# Patient Record
Sex: Female | Born: 1950 | Race: White | Hispanic: No | Marital: Married | Smoking: Never smoker
Health system: Southern US, Community
[De-identification: ages and names within clinical notes are randomized; demographics above are authoritative.]

## PROBLEM LIST (undated history)

## (undated) DIAGNOSIS — Z8719 Personal history of other diseases of the digestive system: Secondary | ICD-10-CM

## (undated) DIAGNOSIS — H269 Unspecified cataract: Secondary | ICD-10-CM

## (undated) DIAGNOSIS — Z7901 Long term (current) use of anticoagulants: Secondary | ICD-10-CM

## (undated) DIAGNOSIS — I480 Paroxysmal atrial fibrillation: Secondary | ICD-10-CM

## (undated) DIAGNOSIS — N950 Postmenopausal bleeding: Secondary | ICD-10-CM

## (undated) DIAGNOSIS — I10 Essential (primary) hypertension: Secondary | ICD-10-CM

## (undated) DIAGNOSIS — F329 Major depressive disorder, single episode, unspecified: Secondary | ICD-10-CM

## (undated) DIAGNOSIS — M1990 Unspecified osteoarthritis, unspecified site: Secondary | ICD-10-CM

## (undated) DIAGNOSIS — Z8701 Personal history of pneumonia (recurrent): Secondary | ICD-10-CM

## (undated) DIAGNOSIS — E7800 Pure hypercholesterolemia, unspecified: Secondary | ICD-10-CM

## (undated) DIAGNOSIS — R350 Frequency of micturition: Secondary | ICD-10-CM

## (undated) DIAGNOSIS — J40 Bronchitis, not specified as acute or chronic: Secondary | ICD-10-CM

## (undated) DIAGNOSIS — E669 Obesity, unspecified: Secondary | ICD-10-CM

## (undated) DIAGNOSIS — Z9889 Other specified postprocedural states: Secondary | ICD-10-CM

## (undated) DIAGNOSIS — K219 Gastro-esophageal reflux disease without esophagitis: Secondary | ICD-10-CM

## (undated) DIAGNOSIS — R1310 Dysphagia, unspecified: Secondary | ICD-10-CM

## (undated) DIAGNOSIS — R002 Palpitations: Secondary | ICD-10-CM

## (undated) DIAGNOSIS — F3289 Other specified depressive episodes: Secondary | ICD-10-CM

## (undated) DIAGNOSIS — J189 Pneumonia, unspecified organism: Secondary | ICD-10-CM

## (undated) DIAGNOSIS — G4733 Obstructive sleep apnea (adult) (pediatric): Secondary | ICD-10-CM

## (undated) DIAGNOSIS — M722 Plantar fascial fibromatosis: Secondary | ICD-10-CM

## (undated) DIAGNOSIS — R519 Headache, unspecified: Secondary | ICD-10-CM

## (undated) DIAGNOSIS — R112 Nausea with vomiting, unspecified: Secondary | ICD-10-CM

## (undated) DIAGNOSIS — I872 Venous insufficiency (chronic) (peripheral): Secondary | ICD-10-CM

## (undated) DIAGNOSIS — Z8669 Personal history of other diseases of the nervous system and sense organs: Secondary | ICD-10-CM

## (undated) HISTORY — DX: Venous insufficiency (chronic) (peripheral): I87.2

## (undated) HISTORY — PX: COLONOSCOPY WITH PROPOFOL: SHX5780

## (undated) HISTORY — DX: Pure hypercholesterolemia, unspecified: E78.00

## (undated) HISTORY — DX: Essential (primary) hypertension: I10

## (undated) HISTORY — DX: Obesity, unspecified: E66.9

## (undated) HISTORY — PX: KNEE ARTHROSCOPY W/ MENISCAL REPAIR: SHX1877

## (undated) HISTORY — PX: KNEE ARTHROSCOPY: SUR90

## (undated) HISTORY — DX: Other specified depressive episodes: F32.89

## (undated) HISTORY — DX: Major depressive disorder, single episode, unspecified: F32.9

## (undated) HISTORY — PX: TRIGGER FINGER RELEASE: SHX641

## (undated) HISTORY — DX: Dysphagia, unspecified: R13.10

## (undated) HISTORY — DX: Palpitations: R00.2

## (undated) HISTORY — PX: KNEE ARTHROSCOPY WITH EXCISION BAKER'S CYST: SHX5646

## (undated) HISTORY — PX: CARPAL TUNNEL RELEASE: SHX101

## (undated) HISTORY — DX: Plantar fascial fibromatosis: M72.2

---

## 1975-07-24 HISTORY — PX: TUBAL LIGATION: SHX77

## 1998-11-23 ENCOUNTER — Other Ambulatory Visit: Admission: RE | Admit: 1998-11-23 | Discharge: 1998-11-23 | Payer: Self-pay | Admitting: *Deleted

## 1999-10-04 ENCOUNTER — Encounter: Admission: RE | Admit: 1999-10-04 | Discharge: 1999-10-04 | Payer: Self-pay | Admitting: *Deleted

## 1999-10-04 ENCOUNTER — Encounter: Payer: Self-pay | Admitting: *Deleted

## 2000-02-14 ENCOUNTER — Other Ambulatory Visit: Admission: RE | Admit: 2000-02-14 | Discharge: 2000-02-14 | Payer: Self-pay | Admitting: *Deleted

## 2000-10-07 ENCOUNTER — Encounter: Admission: RE | Admit: 2000-10-07 | Discharge: 2000-10-07 | Payer: Self-pay | Admitting: *Deleted

## 2000-10-07 ENCOUNTER — Encounter: Payer: Self-pay | Admitting: *Deleted

## 2001-04-02 ENCOUNTER — Other Ambulatory Visit: Admission: RE | Admit: 2001-04-02 | Discharge: 2001-04-02 | Payer: Self-pay | Admitting: *Deleted

## 2001-12-30 ENCOUNTER — Encounter: Admission: RE | Admit: 2001-12-30 | Discharge: 2001-12-30 | Payer: Self-pay | Admitting: *Deleted

## 2001-12-30 ENCOUNTER — Encounter: Payer: Self-pay | Admitting: *Deleted

## 2002-04-22 ENCOUNTER — Other Ambulatory Visit: Admission: RE | Admit: 2002-04-22 | Discharge: 2002-04-22 | Payer: Self-pay | Admitting: *Deleted

## 2002-07-23 HISTORY — PX: CARPAL TUNNEL RELEASE: SHX101

## 2003-01-28 ENCOUNTER — Ambulatory Visit (HOSPITAL_COMMUNITY): Admission: RE | Admit: 2003-01-28 | Discharge: 2003-01-28 | Payer: Self-pay | Admitting: Orthopedic Surgery

## 2003-03-31 ENCOUNTER — Encounter: Admission: RE | Admit: 2003-03-31 | Discharge: 2003-03-31 | Payer: Self-pay | Admitting: *Deleted

## 2003-03-31 ENCOUNTER — Encounter: Payer: Self-pay | Admitting: *Deleted

## 2003-04-28 ENCOUNTER — Other Ambulatory Visit: Admission: RE | Admit: 2003-04-28 | Discharge: 2003-04-28 | Payer: Self-pay | Admitting: *Deleted

## 2003-07-24 ENCOUNTER — Emergency Department (HOSPITAL_COMMUNITY): Admission: AD | Admit: 2003-07-24 | Discharge: 2003-07-24 | Payer: Self-pay | Admitting: Family Medicine

## 2004-06-06 ENCOUNTER — Ambulatory Visit: Payer: Self-pay | Admitting: Pulmonary Disease

## 2004-06-21 ENCOUNTER — Other Ambulatory Visit: Admission: RE | Admit: 2004-06-21 | Discharge: 2004-06-21 | Payer: Self-pay | Admitting: *Deleted

## 2004-07-18 ENCOUNTER — Ambulatory Visit: Payer: Self-pay | Admitting: Pulmonary Disease

## 2004-07-19 ENCOUNTER — Encounter: Admission: RE | Admit: 2004-07-19 | Discharge: 2004-07-19 | Payer: Self-pay | Admitting: *Deleted

## 2004-07-23 HISTORY — PX: CARPAL TUNNEL RELEASE: SHX101

## 2004-07-28 ENCOUNTER — Ambulatory Visit (HOSPITAL_COMMUNITY): Admission: RE | Admit: 2004-07-28 | Discharge: 2004-07-28 | Payer: Self-pay | Admitting: Orthopedic Surgery

## 2004-07-28 ENCOUNTER — Ambulatory Visit (HOSPITAL_BASED_OUTPATIENT_CLINIC_OR_DEPARTMENT_OTHER): Admission: RE | Admit: 2004-07-28 | Discharge: 2004-07-28 | Payer: Self-pay | Admitting: Orthopedic Surgery

## 2005-01-03 ENCOUNTER — Ambulatory Visit: Payer: Self-pay | Admitting: Pulmonary Disease

## 2005-01-15 ENCOUNTER — Ambulatory Visit: Payer: Self-pay | Admitting: Pulmonary Disease

## 2005-04-10 ENCOUNTER — Ambulatory Visit: Payer: Self-pay | Admitting: Pulmonary Disease

## 2005-05-29 ENCOUNTER — Ambulatory Visit: Payer: Self-pay | Admitting: Pulmonary Disease

## 2005-07-31 ENCOUNTER — Ambulatory Visit: Payer: Self-pay | Admitting: Pulmonary Disease

## 2005-08-03 ENCOUNTER — Ambulatory Visit: Payer: Self-pay | Admitting: Pulmonary Disease

## 2005-09-25 ENCOUNTER — Other Ambulatory Visit: Admission: RE | Admit: 2005-09-25 | Discharge: 2005-09-25 | Payer: Self-pay | Admitting: Obstetrics & Gynecology

## 2005-10-10 ENCOUNTER — Encounter: Admission: RE | Admit: 2005-10-10 | Discharge: 2005-10-10 | Payer: Self-pay | Admitting: Obstetrics and Gynecology

## 2005-10-23 ENCOUNTER — Encounter: Admission: RE | Admit: 2005-10-23 | Discharge: 2005-10-23 | Payer: Self-pay | Admitting: Orthopedic Surgery

## 2006-01-03 ENCOUNTER — Ambulatory Visit: Payer: Self-pay | Admitting: Pulmonary Disease

## 2006-01-10 ENCOUNTER — Ambulatory Visit: Payer: Self-pay | Admitting: Pulmonary Disease

## 2006-04-02 ENCOUNTER — Ambulatory Visit: Payer: Self-pay | Admitting: Pulmonary Disease

## 2006-06-25 ENCOUNTER — Ambulatory Visit: Payer: Self-pay | Admitting: Pulmonary Disease

## 2006-06-25 LAB — CONVERTED CEMR LAB
ALT: 19 units/L (ref 0–40)
Albumin: 4.1 g/dL (ref 3.5–5.2)
Alkaline Phosphatase: 65 units/L (ref 39–117)
BUN: 12 mg/dL (ref 6–23)
CO2: 31 meq/L (ref 19–32)
Calcium: 9.9 mg/dL (ref 8.4–10.5)
Chol/HDL Ratio, serum: 2.6
Sodium: 143 meq/L (ref 135–145)
Total Bilirubin: 1 mg/dL (ref 0.3–1.2)
Triglyceride fasting, serum: 54 mg/dL (ref 0–149)
VLDL: 11 mg/dL (ref 0–40)

## 2006-10-09 ENCOUNTER — Other Ambulatory Visit: Admission: RE | Admit: 2006-10-09 | Discharge: 2006-10-09 | Payer: Self-pay | Admitting: Obstetrics & Gynecology

## 2006-10-11 ENCOUNTER — Encounter: Admission: RE | Admit: 2006-10-11 | Discharge: 2006-10-11 | Payer: Self-pay | Admitting: Obstetrics & Gynecology

## 2007-01-07 ENCOUNTER — Ambulatory Visit: Payer: Self-pay | Admitting: Pulmonary Disease

## 2007-01-07 LAB — CONVERTED CEMR LAB
ALT: 41 units/L — ABNORMAL HIGH (ref 0–40)
AST: 35 units/L (ref 0–37)
Bilirubin Urine: NEGATIVE
CO2: 33 meq/L — ABNORMAL HIGH (ref 19–32)
Eosinophils Absolute: 0.1 10*3/uL (ref 0.0–0.6)
GFR calc Af Amer: 133 mL/min
GFR calc non Af Amer: 110 mL/min
Glucose, Bld: 94 mg/dL (ref 70–99)
HCT: 36.3 % (ref 36.0–46.0)
HDL: 49 mg/dL (ref >39.0)
LDL Cholesterol: 74 mg/dL (ref 0–99)
MCHC: 34.1 g/dL (ref 30.0–36.0)
Monocytes Absolute: 0.4 10*3/uL (ref 0.2–0.7)
Monocytes Relative: 6.1 % (ref 3.0–11.0)
Neutro Abs: 3.7 10*3/uL (ref 1.4–7.7)
Nitrite: NEGATIVE
Platelets: 186 10*3/uL (ref 150–400)
RBC: 4.17 M/uL (ref 3.87–5.11)
RDW: 12.2 % (ref 11.5–14.6)
Total Bilirubin: 0.4 mg/dL (ref 0.3–1.2)
Total Protein, Urine: NEGATIVE mg/dL
Total Protein: 6.5 g/dL (ref 6.0–8.3)
Triglycerides: 75 mg/dL (ref 0–149)
Urine Glucose: NEGATIVE mg/dL
Urobilinogen, UA: 0.2 (ref 0.0–1.0)
Vit D, 1,25-Dihydroxy: 40 (ref 20–57)
WBC: 6.1 10*3/uL (ref 4.5–10.5)
pH: 6 (ref 5.0–8.0)

## 2007-09-17 ENCOUNTER — Telehealth: Payer: Self-pay | Admitting: Pulmonary Disease

## 2007-10-02 ENCOUNTER — Encounter: Payer: Self-pay | Admitting: Pulmonary Disease

## 2007-10-02 DIAGNOSIS — IMO0001 Reserved for inherently not codable concepts without codable children: Secondary | ICD-10-CM | POA: Insufficient documentation

## 2007-10-02 DIAGNOSIS — Z87898 Personal history of other specified conditions: Secondary | ICD-10-CM | POA: Insufficient documentation

## 2007-10-02 DIAGNOSIS — T466X5A Adverse effect of antihyperlipidemic and antiarteriosclerotic drugs, initial encounter: Secondary | ICD-10-CM | POA: Insufficient documentation

## 2007-10-13 ENCOUNTER — Other Ambulatory Visit: Admission: RE | Admit: 2007-10-13 | Discharge: 2007-10-13 | Payer: Self-pay | Admitting: Obstetrics & Gynecology

## 2007-10-17 ENCOUNTER — Encounter: Admission: RE | Admit: 2007-10-17 | Discharge: 2007-10-17 | Payer: Self-pay | Admitting: Obstetrics & Gynecology

## 2007-12-11 ENCOUNTER — Ambulatory Visit: Payer: Self-pay | Admitting: Internal Medicine

## 2008-01-21 ENCOUNTER — Ambulatory Visit: Payer: Self-pay | Admitting: Pulmonary Disease

## 2008-01-21 DIAGNOSIS — E785 Hyperlipidemia, unspecified: Secondary | ICD-10-CM | POA: Insufficient documentation

## 2008-01-21 DIAGNOSIS — I10 Essential (primary) hypertension: Secondary | ICD-10-CM | POA: Insufficient documentation

## 2008-01-23 LAB — CONVERTED CEMR LAB
ALT: 24 units/L (ref 0–35)
Alkaline Phosphatase: 62 units/L (ref 39–117)
BUN: 19 mg/dL (ref 6–23)
Bacteria, UA: NEGATIVE
Bilirubin Urine: NEGATIVE
CO2: 32 meq/L (ref 19–32)
Chloride: 105 meq/L (ref 96–112)
Cholesterol: 143 mg/dL (ref 0–200)
Crystals: NEGATIVE
Eosinophils Absolute: 0.2 10*3/uL (ref 0.0–0.7)
GFR calc Af Amer: 95 mL/min
GFR calc non Af Amer: 78 mL/min
Glucose, Bld: 91 mg/dL (ref 70–99)
HCT: 39.2 % (ref 36.0–46.0)
HDL: 55.4 mg/dL (ref >39.0)
Hemoglobin: 13.4 g/dL (ref 12.0–15.0)
Ketones, ur: NEGATIVE mg/dL
LDL Cholesterol: 68 mg/dL (ref 0–99)
Lymphocytes Relative: 31.6 % (ref 12.0–46.0)
MCHC: 34.3 g/dL (ref 30.0–36.0)
Neutrophils Relative %: 59.6 % (ref 43.0–77.0)
Nitrite: NEGATIVE
Total Bilirubin: 1 mg/dL (ref 0.3–1.2)
Total Protein, Urine: NEGATIVE mg/dL
Urine Glucose: NEGATIVE mg/dL
Urobilinogen, UA: 0.2 (ref 0.0–1.0)
VLDL: 20 mg/dL (ref 0–40)

## 2008-01-29 ENCOUNTER — Ambulatory Visit: Payer: Self-pay | Admitting: Pulmonary Disease

## 2008-01-29 LAB — CONVERTED CEMR LAB
OCCULT 2: NEGATIVE
OCCULT 3: NEGATIVE
OCCULT 4: NEGATIVE

## 2008-02-03 ENCOUNTER — Ambulatory Visit: Payer: Self-pay | Admitting: Pulmonary Disease

## 2008-10-01 ENCOUNTER — Ambulatory Visit: Payer: Self-pay | Admitting: Pulmonary Disease

## 2008-10-18 ENCOUNTER — Encounter: Admission: RE | Admit: 2008-10-18 | Discharge: 2008-10-18 | Payer: Self-pay | Admitting: Obstetrics & Gynecology

## 2008-10-20 ENCOUNTER — Other Ambulatory Visit: Admission: RE | Admit: 2008-10-20 | Discharge: 2008-10-20 | Payer: Self-pay | Admitting: Obstetrics & Gynecology

## 2008-10-21 ENCOUNTER — Encounter: Admission: RE | Admit: 2008-10-21 | Discharge: 2008-10-21 | Payer: Self-pay | Admitting: Obstetrics & Gynecology

## 2009-01-20 ENCOUNTER — Ambulatory Visit: Payer: Self-pay | Admitting: Pulmonary Disease

## 2009-01-23 LAB — CONVERTED CEMR LAB
AST: 24 units/L (ref 0–37)
Alkaline Phosphatase: 64 units/L (ref 39–117)
Basophils Absolute: 0 10*3/uL (ref 0.0–0.1)
Basophils Relative: 0.7 % (ref 0.0–3.0)
Bilirubin, Direct: 0.2 mg/dL (ref 0.0–0.3)
Calcium: 9.6 mg/dL (ref 8.4–10.5)
Chloride: 104 meq/L (ref 96–112)
Cholesterol: 150 mg/dL (ref 0–200)
Creatinine, Ser: 0.7 mg/dL (ref 0.4–1.2)
Eosinophils Absolute: 0.2 10*3/uL (ref 0.0–0.7)
Eosinophils Relative: 2.4 % (ref 0.0–5.0)
HDL: 55.8 mg/dL (ref >39.00)
Hemoglobin, Urine: NEGATIVE
Hemoglobin: 13.5 g/dL (ref 12.0–15.0)
Ketones, ur: NEGATIVE mg/dL
LDL Cholesterol: 73 mg/dL (ref 0–99)
Lymphs Abs: 1.7 10*3/uL (ref 0.7–4.0)
MCHC: 34.5 g/dL (ref 30.0–36.0)
Monocytes Relative: 6.9 % (ref 3.0–12.0)
RBC: 4.6 M/uL (ref 3.87–5.11)
Sodium: 142 meq/L (ref 135–145)
Specific Gravity, Urine: 1.02 (ref 1.000–1.030)
TSH: 2.31 microintl units/mL (ref 0.35–5.50)
Total Bilirubin: 0.9 mg/dL (ref 0.3–1.2)
Total Protein, Urine: NEGATIVE mg/dL
Triglycerides: 104 mg/dL (ref 0.0–149.0)
Urine Glucose: NEGATIVE mg/dL
pH: 7 (ref 5.0–8.0)

## 2009-01-27 ENCOUNTER — Ambulatory Visit: Payer: Self-pay | Admitting: Pulmonary Disease

## 2009-01-27 LAB — CONVERTED CEMR LAB
Fecal Occult Blood: NEGATIVE
OCCULT 2: NEGATIVE
OCCULT 5: NEGATIVE

## 2009-06-24 ENCOUNTER — Telehealth: Payer: Self-pay | Admitting: Pulmonary Disease

## 2009-06-27 ENCOUNTER — Encounter: Payer: Self-pay | Admitting: Pulmonary Disease

## 2009-10-19 ENCOUNTER — Encounter: Admission: RE | Admit: 2009-10-19 | Discharge: 2009-10-19 | Payer: Self-pay | Admitting: Obstetrics & Gynecology

## 2010-03-03 ENCOUNTER — Ambulatory Visit: Payer: Self-pay | Admitting: Pulmonary Disease

## 2010-03-04 DIAGNOSIS — I872 Venous insufficiency (chronic) (peripheral): Secondary | ICD-10-CM | POA: Insufficient documentation

## 2010-03-04 LAB — CONVERTED CEMR LAB
ALT: 21 units/L (ref 0–35)
BUN: 19 mg/dL (ref 6–23)
Basophils Absolute: 0 10*3/uL (ref 0.0–0.1)
Basophils Relative: 0.3 % (ref 0.0–3.0)
Bilirubin, Direct: 0.1 mg/dL (ref 0.0–0.3)
CO2: 33 meq/L — ABNORMAL HIGH (ref 19–32)
Calcium: 9.8 mg/dL (ref 8.4–10.5)
Chloride: 104 meq/L (ref 96–112)
Cholesterol: 160 mg/dL (ref 0–200)
Creatinine, Ser: 0.7 mg/dL (ref 0.4–1.2)
Eosinophils Relative: 1.9 % (ref 0.0–5.0)
GFR calc non Af Amer: 84.99 mL/min (ref >60)
Glucose, Bld: 100 mg/dL — ABNORMAL HIGH (ref 70–99)
HCT: 40 % (ref 36.0–46.0)
HDL: 56.2 mg/dL (ref >39.00)
Hemoglobin: 13.7 g/dL (ref 12.0–15.0)
LDL Cholesterol: 80 mg/dL (ref 0–99)
Leukocytes, UA: NEGATIVE
MCHC: 34.3 g/dL (ref 30.0–36.0)
MCV: 86.3 fL (ref 78.0–100.0)
Neutrophils Relative %: 67.7 % (ref 43.0–77.0)
Nitrite: NEGATIVE
Platelets: 208 10*3/uL (ref 150.0–400.0)
RBC: 4.64 M/uL (ref 3.87–5.11)
RDW: 14.1 % (ref 11.5–14.6)
Total Bilirubin: 0.7 mg/dL (ref 0.3–1.2)
Total Protein, Urine: NEGATIVE mg/dL
Triglycerides: 119 mg/dL (ref 0.0–149.0)
Urine Glucose: NEGATIVE mg/dL
Urobilinogen, UA: 0.2 (ref 0.0–1.0)

## 2010-08-22 NOTE — Assessment & Plan Note (Signed)
Summary: cpx/jd   Primary Care Provider:  Kriste Williamson  CC:  Yearly ROV & CPX....  History of Present Illness: 60 y/o WF here for a yearly follow up and CPX... she has had a good year medically but continues to be under mod stress having to care for her husb w/ end-stage MS... recently she has had some plantar fasciitis & bone spur dx by DrZigler, Podiatry> given shot & brace...    Current Problem List:    ABNORMAL CHEST XRAY (ICD-793.1) - she has a < 1cm calcif nodule seen in LLL- likely granuloma and without serial change...  ~  CXR 6/09 showed no change...  ~  CXR 3/10 showed borderline Cor size, NAD.[name].  ~  CXR 8/11 showed borderline cor, ectatic Ao, clear lungs, mild DJD spine.  HYPERTENSION (ICD-401.9) & Hx of PALPITATIONS (ICD-785.1) - on ASA 81mg /d + ATENOLOL 50mg /d... BP= 132/80 and asymptomatic... denies HA, fatigue, visual changes, CP, palipit, dizziness, syncope, dyspnea, edema, etc...  VENOUS INSUFFICIENCY (ICD-459.81) - she has mod VI changes and trace edema... she knows to elim sodium, elevate, wear TED hose, etc...  HYPERCHOLESTEROLEMIA (ICD-272.0) - on SIMVASTATIN 40mg /d...  ~  FLP 6/08 showed TChol 138, Tg 75, HDL 49, LDL 74... continue same.  ~  FLP 6/09 showed TChol 143, TG 98, HDL 55, LDL 68  ~  FLP 7/10 showed TChol 150, TG 104, HDL 56, LDL 73  ~  FLP 8/11 on Simva40 showed TChol 160, TG 119, HDL 56, LDL 80  OBESITY (ICD-278.00) - she is 5\' 1"  tall and her weights of 190 -220 w/ BMI 36 - 42  ~  weight 6/08 = 163#  ~  weight 7/09 = 188#  ~  weight 3/10 = 217#  ~  weight 8/11 = 211#  GASTROESOPHAGEAL REFLUX DISEASE (ICD-530.81) - uses OTC Prilosec as needed...  ~  routine colonoscopy 9/03 by [name] was WNL... f/u planned 67yrs.  FIBROMYALGIA (ICD-729.1) - symptoms suggesting FM in the past... ortho eval from [name]...  MIGRAINES, HX OF (ICD-V13.8) - she has hx of migraines and notes some HA when lying back & occiput touches pillow at night... headaches have  improved over time, using TYLENOL Prn...  ANXIETY & DEPRESSION (ICD-311) - under mod stress caring for husb w/ end-stage MS...  ~  7/10: notes that Hospice signed off & now getting "care management" w/ [facility]- 10H per week help at home... ALPRAZOLAM 0.5mg  Prn.  Health Maintenance - she had PNEUMOVAX 2006, TETANUS shot 2006, & yearly Flu shots... GYN= DrMiller for PAP, [facility]= [facility], and BMD reported normal per pt... she takes Calcium, MVI, Vit D supplement...   Preventive Screening-Counseling & Management  Alcohol-Tobacco     Smoking Status: never  Allergies: No Known Drug Allergies  Comments:  Nurse/Medical Assistant: The patient's medications and allergies were reviewed with the patient and were updated in the Medication and Allergy Lists.  Past History:  Past Medical History: ABNORMAL CHEST XRAY (ICD-793.1) HYPERTENSION (ICD-401.9) Hx of PALPITATIONS (ICD-785.1) VENOUS INSUFFICIENCY (ICD-459.81) HYPERCHOLESTEROLEMIA (ICD-272.0) OBESITY (ICD-278.00) GASTROESOPHAGEAL REFLUX DISEASE (ICD-530.81) FIBROMYALGIA (ICD-729.1) MIGRAINES, HX OF (ICD-V13.8) DEPRESSION (ICD-311)  Past Surgical History: S/P bilat carpel tunnel surgeries by [name] 2004 & 2006  Family History: Reviewed history from 12/11/2007 and no changes required. Family history of heart diesease and clotting disorder in mother and father; cancer in brother Mother deceased at age 52 due to heart failure Father commited suicide at age 42 Patient has 4 siblings  Social History: Reviewed  history from 10/01/2008 and no changes required. married - husb Amy Williamson has end-stage MS 2 children--sons never smoked no alcohol  Review of Systems       The patient complains of fatigue, dyspnea on exertion, and depression.  The patient denies fever, chills, sweats, anorexia, weakness, malaise, weight loss, sleep disorder, blurring, diplopia, eye irritation, eye discharge, vision loss,  eye pain, photophobia, earache, ear discharge, tinnitus, decreased hearing, nasal congestion, nosebleeds, sore throat, hoarseness, chest pain, palpitations, syncope, orthopnea, PND, peripheral edema, cough, dyspnea at rest, excessive sputum, hemoptysis, wheezing, pleurisy, nausea, vomiting, diarrhea, constipation, change in bowel habits, abdominal pain, melena, hematochezia, jaundice, gas/bloating, indigestion/heartburn, dysphagia, odynophagia, dysuria, hematuria, urinary frequency, urinary hesitancy, nocturia, incontinence, back pain, joint pain, joint swelling, muscle cramps, muscle weakness, stiffness, arthritis, sciatica, restless legs, leg pain at night, leg pain with exertion, rash, itching, dryness, suspicious lesions, paralysis, paresthesias, seizures, tremors, vertigo, transient blindness, frequent falls, frequent headaches, difficulty walking, anxiety, memory loss, confusion, cold intolerance, heat intolerance, polydipsia, polyphagia, polyuria, unusual weight change, abnormal bruising, bleeding, enlarged lymph nodes, urticaria, allergic rash, hay fever, and recurrent infections.    Vital Signs:  Patient profile:   60 year old female Height:      60.5 inches Weight:      211 pounds BMI:     40.68 O2 Sat:      99 % on Room air Temp:     98.8 degrees F oral Pulse rate:   50 / minute BP sitting:   132 / 80  (right arm) Cuff size:   regular  Vitals Entered By: [name] CMA (March 03, 2010 9:33 AM)  O2 Sat at Rest %:  99 O2 Flow:  Room air CC: Yearly ROV & CPX... Is Patient Diabetic? No Pain Assessment Patient in pain? yes      Comments meds updated today with pt   Physical Exam  Additional Exam:  WD, WN, 60 y/o WF in NAD... GENERAL:  Alert & oriented; pleasant & cooperative... HEENT:  [name]/AT, EOM-wnl, PERRLA, EACs-clear, TMs-wnl, NOSE-clear, THROAT-clear & wnl. NECK:  Supple w/ full ROM; no JVD; normal carotid impulses w/o bruits; no thyromegaly or nodules palpated; no  lymphadenopathy. CHEST:  Clear to P & A; without wheezes/ rales/ or rhonchi. HEART:  Regular Rhythm; without murmurs/ rubs/ or gallops. ABDOMEN:  Soft & nontender; normal bowel sounds; no organomegaly or masses detected. EXT: without deformities, mild arthritic changes; no varicose veins/ venous insuffic/ or edema. NEURO:  CN's intact; motor testing normal; sensory testing normal; gait normal & balance OK. DERM:  No lesions noted; no rash etc...    CXR  Procedure date:  03/03/2010  Findings:      CHEST - 2 VIEW Comparison: 10/01/2008   Findings: Stable mild cardiomegaly is identified.  Mild ectasia of the thoracic aorta is unchanged in degree and would correlate with the patient's history of hypertension.  A normal mediastinal configuration is seen.   The lung fields are clear with no signs of focal infiltrate or congestive failure.  No pleural fluid or peribronchial cuffing is noted.   Osseous structures demonstrate some mild degenerative change of the lower thoracic spine and are otherwise intact.   IMPRESSION: Stable cardiopulmonary appearance with unchanged mild cardiomegaly. No new focal or acute abnormality seen   Read By:  Bertha Stakes,  M.D.   EKG  Procedure date:  03/03/2010  Findings:      Sinus bradycardia with rate of:  55/min... Tracing is WNL, NAD...  SN  MISC. Report  Procedure date:  03/03/2010  Findings:      BMP (METABOL)   Sodium                    143 mEq/L                   135-145   Potassium                 4.5 mEq/L                   3.5-5.1   Chloride                  104 mEq/L                   96-112   Carbon Dioxide       [H]  33 mEq/L                    19-32   Glucose              [H]  100 mg/dL                   16-10   BUN                       19 mg/dL                    9-60   Creatinine                0.7 mg/dL                   4.5-4.0   Calcium                   9.8 mg/dL                   9.8-11.9   GFR                        84.99 mL/min                >60  Hepatic/Liver Function Panel (HEPATIC)   Total Bilirubin           0.7 mg/dL                   1.4-7.8   Direct Bilirubin          0.1 mg/dL                   2.9-5.6   Alkaline Phosphatase      63 U/L                      39-117   AST                       22 U/L                      0-37   ALT                       21 U/L                      0-35   Total Protein  6.7 g/dL                    1.6-1.0   Albumin                   4.1 g/dL                    9.6-0.4  CBC Platelet w/Diff (CBCD)   White Cell Count          9.3 K/uL                    4.5-10.5   Red Cell Count            4.64 Mil/uL                 3.87-5.11   Hemoglobin                13.7 g/dL                   54.0-98.1   Hematocrit                40.0 %                      36.0-46.0   MCV                       86.3 fl                     78.0-100.0   Platelet Count            208.0 K/uL                  150.0-400.0   Neutrophil %              67.7 %                      43.0-77.0   Lymphocyte %              24.1 %                      12.0-46.0   Monocyte %                6.0 %                       3.0-12.0   Eosinophils%              1.9 %                       0.0-5.0   Basophils %               0.3 %                       0.0-3.0  Comments:      Lipid Panel (LIPID)   Cholesterol               160 mg/dL                   1-914   Triglycerides             119.0 mg/dL                 7.8-295.6   HDL  56.20 mg/dL                 >16.10   LDL Cholesterol           80 mg/dL                    9-60    TSH (TSH)   FastTSH                   2.37 uIU/mL                 0.35-5.50  UDip w/Micro (URINE)   Color                     YELLOW   Clarity                   CLEAR                       Clear   Specific Gravity          >=1.030                     1.000 - 1.030   Urine Ph                  5.5                         5.0-8.0   Protein                    NEGATIVE                    Negative   Urine Glucose             NEGATIVE                    Negative   Ketones                   NEGATIVE                    Negative   Urine Bilirubin           NEGATIVE                    Negative   Blood                     NEGATIVE                    Negative   Urobilinogen              0.2                         0.0 - 1.0   Leukocyte Esterace        NEGATIVE                    Negative   Nitrite                   NEGATIVE                    Negative   Urine WBC                 0-2/hpf  0-2/hpf   Urine RBC                 0-2/hpf                     0-2/hpf   Urine Mucus               Presence of                 None   Impression & Recommendations:  Problem # 1:  ABNORMAL CHEST XRAY (ICD-793.1) Prob granuloma- not mentioned on this yrs CXR...  Problem # 2:  HYPERTENSION (ICD-401.9) Controlled>  same med. Her updated medication list for this problem includes:    Atenolol 50 Mg Tabs (Atenolol) .[name]... Take 1 tablet by mouth once a day  Problem # 3:  HYPERCHOLESTEROLEMIA (ICD-272.0) Doing well on the Simva40... Her updated medication list for this problem includes:    Simvastatin 40 Mg Tabs (Simvastatin) .[name]... Take one tablet by mouth at bedtime...  Problem # 4:  OBESITY (ICD-278.00) We discussed diet + exercise... must lose weight!!!  Problem # 5:  DEPRESSION (ICD-311) She is coping well w/ her situation & care needs of her husb... Her updated medication list for this problem includes:    Alprazolam 0.5 Mg Tabs (Alprazolam) .[name]... Take 1/2 to 1 tab by mouth three times a day as needed for nerves...  Problem # 6:  OTHER MEDICAL PROBLEMS AS NOTED>>>  Complete Medication List: 1)  Adult Aspirin Low Strength 81 Mg Tbdp (Aspirin) .... Take 1 tablet by mouth once a day 2)  Atenolol 50 Mg Tabs (Atenolol) .... Take 1 tablet by mouth once a day 3)  Simvastatin 40 Mg Tabs (Simvastatin) .... Take one tablet by mouth at  bedtime.Marland KitchenMarland Kitchen 4)  Calcium Carbonate 600 Mg Tabs (Calcium carbonate) .... One tablet daily. 5)  Multivitamins Tabs (Multiple vitamin) .... One tablet daily. 6)  Vitamin D 400 Unit Tabs (Cholecalciferol) .... Take 1 tablet by mouth once a day 7)  Alprazolam 0.5 Mg Tabs (Alprazolam) .... Take 1/2 to 1 tab by mouth three times a day as needed for nerves...  Other Orders: 12 Lead EKG (12 Lead EKG) T-2 View CXR (71020TC) TLB-BMP (Basic Metabolic Panel-BMET) (80048-METABOL) TLB-Hepatic/Liver Function Pnl (80076-HEPATIC) TLB-CBC Platelet - w/Differential (85025-CBCD) TLB-Lipid Panel (80061-LIPID) TLB-TSH (Thyroid Stimulating Hormone) (84443-TSH) TLB-Udip w/ Micro (81001-URINE)  Patient Instructions: 1)  Today we updated your med list- see below.... 2)  We refilled your meds as requested... 3)  Today we did your follow up CXR, EKG, and fasting blood work... please call the "phone tree" in a few days for your lab results.Marland KitchenMarland Kitchen 4)  Let's get on track w/ our diet & exercise program... the goal is to lose 15-20 lbs!!! 5)  Call for any problems.Marland KitchenMarland Kitchen 6)  Please schedule a follow-up appointment in 1 year, sooner as needed... Prescriptions: ALPRAZOLAM 0.5 MG TABS (ALPRAZOLAM) take 1/2 to 1 tab by mouth three times a day as needed for nerves...  #100 x 6   Entered and Authorized by:   Michele Mcalpine MD   Signed by:   Michele Mcalpine MD on 03/03/2010   Method used:   Print then Give to Patient   RxID:   [id number] SIMVASTATIN 40 MG  TABS (SIMVASTATIN) Take one tablet by mouth at bedtime...  #90 x 4   Entered and Authorized by:   Michele Mcalpine MD   Signed by:   Michele Mcalpine MD on  03/03/2010   Method used:   Print then Give to Patient   RxID:   [id number] ATENOLOL 50 MG  TABS (ATENOLOL) Take 1 tablet by mouth once a day  #90 x 4   Entered and Authorized by:   Michele Mcalpine MD   Signed by:   Michele Mcalpine MD on 03/03/2010   Method used:   Print then Give to Patient   RxID:    [id number]     CardioPerfect ECG  ID: [id number] Patient: Amy Williamson DOB: 1950-08-06 Age: 60 Years Old Sex: Female Race: White Physician: Yvaine Jankowiak Technician: [name] CMA Height: 60.5 Weight: 211 Status: Unconfirmed Past Medical History:  ABNORMAL CHEST XRAY (ICD-793.1) HYPERTENSION (ICD-401.9) Hx of PALPITATIONS (ICD-785.1) HYPERCHOLESTEROLEMIA (ICD-272.0) GASTROESOPHAGEAL REFLUX DISEASE (ICD-530.81) FIBROMYALGIA (ICD-729.1) MIGRAINES, HX OF (ICD-V13.8) DEPRESSION (ICD-311)   Recorded: 03/03/2010 09:30 AM P/PR: 118 ms / 198 ms - Heart rate (maximum exercise) QRS: 84 QT/QTc/QTd: 412 ms / 403 ms / 55 ms - Heart rate (maximum exercise)  P/QRS/T axis: 50 deg / 41 deg / 20 deg - Heart rate (maximum exercise)  Heartrate: 55 bpm  Interpretation:  Sinus bradycardia with rate of:  55/min... Tracing is WNL, NAD...  SN

## 2010-09-29 ENCOUNTER — Other Ambulatory Visit: Payer: Self-pay | Admitting: Obstetrics & Gynecology

## 2010-09-29 DIAGNOSIS — Z1231 Encounter for screening mammogram for malignant neoplasm of breast: Secondary | ICD-10-CM

## 2010-10-25 ENCOUNTER — Ambulatory Visit
Admission: RE | Admit: 2010-10-25 | Discharge: 2010-10-25 | Disposition: A | Discharge status: Home / Self Care | Payer: 59 | Source: Ambulatory Visit | Attending: Obstetrics & Gynecology | Admitting: Obstetrics & Gynecology

## 2010-10-25 DIAGNOSIS — Z1231 Encounter for screening mammogram for malignant neoplasm of breast: Secondary | ICD-10-CM

## 2010-10-26 ENCOUNTER — Other Ambulatory Visit: Payer: Self-pay | Admitting: Obstetrics & Gynecology

## 2010-10-26 DIAGNOSIS — R928 Other abnormal and inconclusive findings on diagnostic imaging of breast: Secondary | ICD-10-CM

## 2010-11-02 ENCOUNTER — Ambulatory Visit
Admission: RE | Admit: 2010-11-02 | Discharge: 2010-11-02 | Disposition: A | Discharge status: Home / Self Care | Payer: 59 | Source: Ambulatory Visit | Attending: Obstetrics & Gynecology | Admitting: Obstetrics & Gynecology

## 2010-11-02 DIAGNOSIS — R928 Other abnormal and inconclusive findings on diagnostic imaging of breast: Secondary | ICD-10-CM

## 2010-12-08 NOTE — Op Note (Signed)
Amy Williamson, Amy Williamson              ACCOUNT NO.:  [id number]   MEDICAL RECORD NO.:  [id number]          PATIENT TYPE:  AMB   LOCATION:  [facility]                         FACILITY:  [facility]   PHYSICIAN:  Marlowe Kays, M.D.  DATE OF BIRTH:  [birth year]   DATE OF PROCEDURE:  07/28/2004  DATE OF DISCHARGE:                                 OPERATIVE REPORT   PREOPERATIVE DIAGNOSIS:  Left carpal tunnel syndrome, status post successful  right carpal tunnel release.   POSTOPERATIVE DIAGNOSIS:  Left carpal tunnel syndrome, status post  successful right carpal tunnel release.   OPERATION:  Decompression of the median nerve, left wrist and hand.   SURGEON:  Marlowe Kays, M.D.   ASSISTANT:  Nurse.   ANESTHESIA:  General.   PATHOLOGY AND JUSTIFICATION FOR PROCEDURE:  I performed right carpal tunnel  release in 2004.  At that time she was noted to have significant median  nerve compression in both wrists, but only now has the left one become  symptomatic enough with pain and numbness to warrant the decompression, she  felt.  At surgery, she had significant discoloration and compression of the  median nerve in the carpal canal.   PROCEDURE:  Initially IV regional anesthesia was done but because of  tourniquet failure, we re-esmarched out the arm sterilely and MAC anesthesia  was utilized.  I also infiltrated the carpal tunnel incision with 0.5%  Marcaine plain.  The incision was marked out along the base of the thenar  eminence, crossing obliquely over the flexor crease of the wrist into the  distal forearm.  The median nerve was identified and protected, and the  overlying skin and subcutaneous tissue and a very thick fascia was  progressively released into the distal palm.  She had some compression of  the distal palm, but most of the compression was in the carpal canal.  Potential bleeders were coagulated with bipolar cautery.  When the  decompression was completed, I irrigated the wound  well with sterile saline  and closed the skin and subcutaneous tissue only with interrupted 4-0 nylon  mattress sutures.  Betadine, Adaptic dry sterile dressing and a volar  plaster splint were applied, tourniquet was released.  At the time of this  dictation, she was on her way to the recovery room in satisfactory condition  with no known complications.      JA/MEDQ  D:  07/28/2004  T:  07/28/2004  Job:  [id number]

## 2010-12-08 NOTE — Op Note (Signed)
   NAME:  Amy Williamson, Amy Williamson                        ACCOUNT NO.:  [id number]   MEDICAL RECORD NO.:  [id number]                   PATIENT TYPE:  AMB   LOCATION:  DAY                                  FACILITY[facility]  [facility]   PHYSICIAN:  Marlowe Kays, M.D.               DATE OF BIRTH:  06/10/1951   DATE OF PROCEDURE:  01/28/2003  DATE OF DISCHARGE:                                 OPERATIVE REPORT   PREOPERATIVE DIAGNOSIS:  Right carpal tunnel syndrome.   POSTOPERATIVE DIAGNOSIS:  Right carpal tunnel syndrome.   OPERATION:  Decompression of the median nerve, right wrist and hand.   SURGEON:  Marlowe Kays, M.D.   ASSISTANT:  Nurse.   ANESTHESIA:  General.   PATHOLOGY AND JUSTIFICATION FOR PROCEDURE:  Signs and symptoms of carpal  tunnel syndrome confirmed by nerve conduction studies, which demonstrated a  moderately severe carpal tunnel syndrome.  At surgery she had significant  compression of the median nerve in the carpal canal, also some slight  compression of some of the major nerve branches distally in the palm.   PROCEDURE:  Satisfactory IV regional anesthesia.  Duraprep from mid-forearm  to fingertips, was draped in a sterile field, and I marked out a carpal  tunnel incision along the base of the thenar eminence, crossing obliquely  over the flexor crease of the wrist in the distal forearm.  The palmaris  tendon was identified and retracted to the ulnar side and the median nerve  beneath it identified and protected.  Her hand was somewhat edematous.  Skin  and subcutaneous tissue and thick fascia were released in the distal palm.  Potential bleeders were coagulated with bipolar cautery.  As noted above,  the compression was mainly in the mid-palm and also slightly in the distal  hand.  When I felt that decompression was completed, the wound was irrigated  well with sterile saline and skin and subcutaneous tissue only closed with  interrupted 4-0 nylon mattress sutures.   Betadine and Adaptic dry sterile  dressing, then a plaster splint were applied.  The tourniquet was released.  She tolerated the procedure well and was taken to the recovery room in  satisfactory condition with no known complications.                                               Marlowe Kays, M.D.    JA/MEDQ  D:  01/28/2003  T:  01/28/2003  Job:  [id number]

## 2011-01-08 ENCOUNTER — Ambulatory Visit (HOSPITAL_BASED_OUTPATIENT_CLINIC_OR_DEPARTMENT_OTHER)
Admission: RE | Admit: 2011-01-08 | Discharge: 2011-01-08 | Disposition: A | Discharge status: Home / Self Care | Payer: 59 | Source: Ambulatory Visit | Attending: Orthopedic Surgery | Admitting: Orthopedic Surgery

## 2011-01-08 DIAGNOSIS — E119 Type 2 diabetes mellitus without complications: Secondary | ICD-10-CM | POA: Insufficient documentation

## 2011-01-08 DIAGNOSIS — Z01812 Encounter for preprocedural laboratory examination: Secondary | ICD-10-CM | POA: Insufficient documentation

## 2011-01-08 DIAGNOSIS — M6530 Trigger finger, unspecified finger: Secondary | ICD-10-CM | POA: Insufficient documentation

## 2011-01-08 HISTORY — PX: OTHER SURGICAL HISTORY: SHX169

## 2011-01-08 LAB — POCT I-STAT 4, (NA,K, GLUC, HGB,HCT)
HCT: 38 % (ref 36.0–46.0)
Potassium: 3.8 mEq/L (ref 3.5–5.1)
Sodium: 141 mEq/L (ref 135–145)

## 2011-01-17 NOTE — Op Note (Signed)
  NAME:  Amy Williamson, [facility]              ACCOUNT NO.:  [id number]  MEDICAL RECORD NO.:  [id number]  LOCATION:                                 FACILITY:  PHYSICIAN:  Marlowe Kays, M.D.  DATE OF BIRTH:  1951-01-12  DATE OF PROCEDURE:  01/08/2011 DATE OF DISCHARGE:                              OPERATIVE REPORT   PREOPERATIVE DIAGNOSIS:  Chronic right ring trigger finger.  POSTOPERATIVE DIAGNOSIS:  Chronic right ring trigger finger.  OPERATION:  Release A1 pulley, right trigger finger.  SURGEON:  Marlowe Kays, M.D.  ASSISTANT:  Nurse  ANESTHESIA:  IV regional.  PATHOLOGIST JUSTIFICATION FOR PROCEDURE:  She is a diabetic, has had one steroid injection several years ago and one recently with the latter one being ineffective and consequently she is here today for the above- mentioned surgery.  PROCEDURE:  Satisfactory IV regional anesthesia, DuraPrep from midforearm to fingertips which were draped in sterile field.  Time-out performed.  I made a transverse incision midway between the distal palmar crease and the MP joint crease for the ring finger.  With blunt dissection, I worked my way down to the underlying flexor tendons and protecting the neurovascular bundles to either side, gently opened the flexor sheath with a 15 knife blade and then released the tendon sheath as far proximally as there was any impingement and then working distally released a portion of the A1 pulley, which I resected and then released the pulley somewhat further distally until there was no longer any impingement on passive flexion and extension of the finger.  Wound was irrigated with sterile saline.  Subcutaneous tissue closed with 3-0 Vicryl, skin with interrupted 4-0 nylon mattress sutures.  Betadine, Adaptic, dry sterile dressing were applied.  At the time of this dictation, the tourniquet was still being enforced to allow little more time to elapse.  She tolerated the procedure well with no  known complications.          ______________________________ Marlowe Kays, M.D.     JA/MEDQ  D:  01/08/2011  T:  01/08/2011  Job:  [id number]  Electronically Signed by Marlowe Kays M.D. on 01/17/2011 02:22:43 PM

## 2011-03-05 ENCOUNTER — Telehealth: Payer: Self-pay | Admitting: Pulmonary Disease

## 2011-03-05 ENCOUNTER — Encounter: Payer: Self-pay | Admitting: Pulmonary Disease

## 2011-03-05 ENCOUNTER — Other Ambulatory Visit (INDEPENDENT_AMBULATORY_CARE_PROVIDER_SITE_OTHER): Payer: 59

## 2011-03-05 ENCOUNTER — Ambulatory Visit (INDEPENDENT_AMBULATORY_CARE_PROVIDER_SITE_OTHER): Payer: 59 | Admitting: Pulmonary Disease

## 2011-03-05 ENCOUNTER — Ambulatory Visit (INDEPENDENT_AMBULATORY_CARE_PROVIDER_SITE_OTHER)
Admission: RE | Admit: 2011-03-05 | Discharge: 2011-03-05 | Disposition: A | Discharge status: Home / Self Care | Payer: 59 | Source: Ambulatory Visit | Attending: Pulmonary Disease | Admitting: Pulmonary Disease

## 2011-03-05 VITALS — BP 130/70 | HR 48 | Temp 97.0°F | Ht 60.5 in | Wt 200.2 lb

## 2011-03-05 DIAGNOSIS — Z0000 Encounter for general adult medical examination without abnormal findings: Secondary | ICD-10-CM

## 2011-03-05 DIAGNOSIS — F329 Major depressive disorder, single episode, unspecified: Secondary | ICD-10-CM

## 2011-03-05 DIAGNOSIS — E7800 Pure hypercholesterolemia, unspecified: Secondary | ICD-10-CM

## 2011-03-05 DIAGNOSIS — IMO0001 Reserved for inherently not codable concepts without codable children: Secondary | ICD-10-CM

## 2011-03-05 DIAGNOSIS — I10 Essential (primary) hypertension: Secondary | ICD-10-CM

## 2011-03-05 DIAGNOSIS — E669 Obesity, unspecified: Secondary | ICD-10-CM

## 2011-03-05 DIAGNOSIS — K219 Gastro-esophageal reflux disease without esophagitis: Secondary | ICD-10-CM

## 2011-03-05 DIAGNOSIS — I872 Venous insufficiency (chronic) (peripheral): Secondary | ICD-10-CM

## 2011-03-05 DIAGNOSIS — R930 Abnormal findings on diagnostic imaging of skull and head, not elsewhere classified: Secondary | ICD-10-CM

## 2011-03-05 LAB — HEPATIC FUNCTION PANEL
AST: 17 U/L (ref 0–37)
Total Bilirubin: 0.6 mg/dL (ref 0.3–1.2)

## 2011-03-05 LAB — URINALYSIS
Ketones, ur: NEGATIVE
Specific Gravity, Urine: 1.025 (ref 1.000–1.030)
Total Protein, Urine: NEGATIVE
Urine Glucose: NEGATIVE
pH: 6 (ref 5.0–8.0)

## 2011-03-05 LAB — BASIC METABOLIC PANEL
BUN: 18 mg/dL (ref 6–23)
GFR: 86.04 mL/min (ref >60.00)
Potassium: 4.3 mEq/L (ref 3.5–5.1)
Sodium: 138 mEq/L (ref 135–145)

## 2011-03-05 LAB — CBC WITH DIFFERENTIAL/PLATELET
Basophils Relative: 0.4 % (ref 0.0–3.0)
Eosinophils Relative: 1.7 % (ref 0.0–5.0)
HCT: 39 % (ref 36.0–46.0)
Lymphs Abs: 1.8 10*3/uL (ref 0.7–4.0)
MCV: 86.7 fl (ref 78.0–100.0)
Monocytes Absolute: 0.4 10*3/uL (ref 0.1–1.0)
Neutro Abs: 4.7 10*3/uL (ref 1.4–7.7)
Platelets: 194 10*3/uL (ref 150.0–400.0)
WBC: 7.1 10*3/uL (ref 4.5–10.5)

## 2011-03-05 LAB — LIPID PANEL
LDL Cholesterol: 66 mg/dL (ref 0–99)
VLDL: 15.2 mg/dL (ref 0.0–40.0)

## 2011-03-05 LAB — TSH: TSH: 1.72 u[IU]/mL (ref 0.35–5.50)

## 2011-03-05 MED ORDER — SIMVASTATIN 40 MG PO TABS: 40.0000 mg | ORAL_TABLET | Freq: Every day | ORAL | Status: DC

## 2011-03-05 MED ORDER — ATENOLOL 50 MG PO TABS: 50.0000 mg | ORAL_TABLET | Freq: Every day | ORAL | Status: DC

## 2011-03-05 MED ORDER — ALPRAZOLAM 0.5 MG PO TABS: ORAL_TABLET | ORAL | Status: DC

## 2011-03-05 NOTE — Progress Notes (Signed)
Subjective:    Patient ID: Amy Williamson, female    DOB: 01-01-1951, 60 y.o.   MRN: [id number]  HPI 60 y/o WF here for a yearly follow up and CPX...   ~  March 03, 2010:  she has had a good year medically but continues to be under mod stress having to care for her husb w/ end-stage MS (she gets 10-14 H per week help from [place])... recently she has had some plantar fasciitis & bone spur dx by DrZigler, Podiatry> given shot & brace...   ~  March 05, 2011:  Yearly CPX & Amy Williamson is holding up well, under lots of stress caring for husb Amy Williamson w/ MS;  BP remains controlled 7 she denies CP, palpit, dizzy, syncope, edema, etc;  Chol continues to look good on Simva40 + her diet & nice 11# wt loss this yr;  Mild reflux symptoms controlled on her Prilosec;  FM is still an issue but she copes very well...    Mammogram & Ultrasound 4/12 showed benign left breast cyst    [name] did right 4th trigger finger surg 6/12> improved           PROBLEM LIST:   ABNORMAL CHEST XRAY (ICD-793.1) - she has a < 1cm calcif nodule seen in LLL- likely granuloma and without serial change... ~  CXR 6/09 showed no change... ~  CXR 3/10 showed borderline Cor size, NAD.[name]. ~  CXR 8/11 showed borderline cor, ectatic Ao, clear lungs, mild DJD spine. ~  CXR 8/12 showed borderline cor, clear lungs, NAD...  HYPERTENSION (ICD-401.9) & Hx of PALPITATIONS (ICD-785.1) - on ASA 81mg /d + ATENOLOL 50mg /d... BP= 130/70 and asymptomatic... denies HA, fatigue, visual changes, CP, palipit, dizziness, syncope, dyspnea, edema, etc...  VENOUS INSUFFICIENCY (ICD-459.81) - she has mod VI changes and trace edema... she knows to elim sodium, elevate, wear TED hose, etc...  HYPERCHOLESTEROLEMIA (ICD-272.0) - on SIMVASTATIN 40mg /d... ~  FLP 6/08 showed TChol 138, Tg 75, HDL 49, LDL 74... continue same. ~  FLP 6/09 showed TChol 143, TG 98, HDL 55, LDL 68 ~  FLP 7/10 showed TChol 150, TG 104, HDL 56, LDL 73 ~  FLP 8/11 on Simva40  showed TChol 160, TG 119, HDL 56, LDL 80 ~  FLP 8/12 on Simva40 showed TChol 140, TG 76, HDL 59, LDL 66  OBESITY (ICD-278.00) - she is 5\' 1"  tall and her weights of 190 -220 w/ BMI 36 - 42 ~  weight 6/08 = 163# ~  weight 7/09 = 188# ~  weight 3/10 = 217# ~  weight 8/11 = 211# ~  Weight 8/12 = 200#  GASTROESOPHAGEAL REFLUX DISEASE (ICD-530.81) - uses OTC Prilosec as needed... ~  routine colonoscopy 9/03 by [name] was WNL... f/u planned 64yrs.  FIBROMYALGIA (ICD-729.1) - symptoms suggesting FM in the past... ortho eval from [name]> right 4th trigger finger surg 6/12.  MIGRAINES, HX OF (ICD-V13.8) - she has hx of migraines and notes some HA when lying back & occiput touches pillow at night... headaches have improved over time, using TYLENOL Prn...  ANXIETY & DEPRESSION (ICD-311) - under mod stress caring for husb w/ end-stage MS... ~  7/10: notes that Hospice signed off & now getting "care management" w/ [facility]- 10H per week help at home... ALPRAZOLAM 0.5mg  Prn.  Health Maintenance - she had PNEUMOVAX 2006, TETANUS shot 2006, & yearly Flu shots... GYN= DrMiller for PAP, [facility], and BMD reported normal per pt... she takes Calcium,  MVI, Vit D supplement...   Current Medications, Allergies, Past Medical History, Past Surgical History, Family History, and Social History were reviewed in [employer] record.   No past surgical history on file.   Outpatient Encounter Prescriptions as of 03/05/2011  Medication Sig Dispense Refill  . ALPRAZolam (XANAX) 0.5 MG tablet Take 1/2 to 1 tablet three times a day as needed.      . Ascorbic Acid (VITAMIN C) 1000 MG tablet Take 1,000 mg by mouth daily.        [name] aspirin 81 MG tablet Take 81 mg by mouth daily.        [name] atenolol (TENORMIN) 50 MG tablet Take 50 mg by mouth daily.        . calcium carbonate (OS-CAL) 600 MG TABS Take 600 mg by mouth daily.        . Multiple Vitamin  (MULTIVITAMIN) tablet Take 1 tablet by mouth daily.        . simvastatin (ZOCOR) 40 MG tablet Take 40 mg by mouth at bedtime.        . vitamin D, CHOLECALCIFEROL, 400 UNITS tablet Take 400 Units by mouth daily.          No Known Allergies   Review of Systems         The patient complains of fatigue, dyspnea on exertion, and depression.  The patient denies fever, chills, sweats, anorexia, weakness, malaise, weight loss, sleep disorder, blurring, diplopia, eye irritation, eye discharge, vision loss, eye pain, photophobia, earache, ear discharge, tinnitus, decreased hearing, nasal congestion, nosebleeds, sore throat, hoarseness, chest pain, palpitations, syncope, orthopnea, PND, peripheral edema, cough, dyspnea at rest, excessive sputum, hemoptysis, wheezing, pleurisy, nausea, vomiting, diarrhea, constipation, change in bowel habits, abdominal pain, melena, hematochezia, jaundice, gas/bloating, indigestion/heartburn, dysphagia, odynophagia, dysuria, hematuria, urinary frequency, urinary hesitancy, nocturia, incontinence, back pain, joint pain, joint swelling, muscle cramps, muscle weakness, stiffness, arthritis, sciatica, restless legs, leg pain at night, leg pain with exertion, rash, itching, dryness, suspicious lesions, paralysis, paresthesias, seizures, tremors, vertigo, transient blindness, frequent falls, frequent headaches, difficulty walking, anxiety, memory loss, confusion, cold intolerance, heat intolerance, polydipsia, polyphagia, polyuria, unusual weight change, abnormal bruising, bleeding, enlarged lymph nodes, urticaria, allergic rash, hay fever, and recurrent infections.     Objective:   Physical Exam    WD, WN, 60 y/o WF in NAD... GENERAL:  Alert & oriented; pleasant & cooperative... HEENT:  [place]/AT, EOM-wnl, PERRLA, EACs-clear, TMs-wnl, NOSE-clear, THROAT-clear & wnl. NECK:  Supple w/ full ROM; no JVD; normal carotid impulses w/o bruits; no thyromegaly or nodules palpated; no  lymphadenopathy. CHEST:  Clear to P & A; without wheezes/ rales/ or rhonchi. HEART:  Regular Rhythm; without murmurs/ rubs/ or gallops. ABDOMEN:  Soft & nontender; normal bowel sounds; no organomegaly or masses detected. EXT: without deformities, mild arthritic changes; no varicose veins/ venous insuffic/ or edema. NEURO:  CN's intact; motor testing normal; sensory testing normal; gait normal & balance OK. DERM:  No lesions noted; no rash etc...   Assessment & Plan:   Abnormal CXR> <1cm LLL calcif nodule= likely granuloma, no serial changes...  HBP>  Remains well controlled on Atenolol 50mg /d...  Ven Insuffic>  Be sure to avaoid sodium, elev legs, wera support hose as discussed..  CHOL>  FLP continues to look good on the simva40...  Obesity>  Weight is down 5% to 200#  & clinically improved...  GERD>  She uses Prilosec OTC as needed...  FM>  She manages well & advised Yoga, stretching,  etc...  Anxiety & Depression>  She has Alpraz for prn use, but [place].Marland KitchenMarland Kitchen

## 2011-03-05 NOTE — Patient Instructions (Signed)
Today we updated your med list in EPIC...    We refilled your meds per request...  Today we did your follow upCXR & fasting blood work...    Please call the [facility] in a few days for your results...    Dial [id number] & when prompted enter your patient number followed by the # symbol...    Your patient number is:  [id number]#  Keep up the great work w/ diet, exercise & weight reduction!!!  Call for any questions...  Let's plan another physical in 1 year, call sooner if needed for problems.Marland KitchenMarland Kitchen

## 2011-03-05 NOTE — Telephone Encounter (Signed)
Informed pt of previous recs. Pt verbalized understanding

## 2011-03-05 NOTE — Telephone Encounter (Signed)
lmomtcb x 1. Pt will need to contact local pharmacy for shingles shot and if that is not an option will need to contact health dept. We do not administer same at our office. Thanks.

## 2011-03-13 ENCOUNTER — Telehealth: Payer: Self-pay | Admitting: Pulmonary Disease

## 2011-03-13 NOTE — Telephone Encounter (Signed)
Called and spoke with pt and she is aware of lab results per SN---all labs looked good.  Pt voiced her understanding of this.

## 2011-03-13 NOTE — Telephone Encounter (Signed)
SN please advise of pts lab results.  They have been printed off.

## 2011-03-13 NOTE — Telephone Encounter (Signed)
Per SN---all labs looked great.  lmomtcb for results.

## 2011-03-13 NOTE — Telephone Encounter (Signed)
Pt returned call from [place]. Kathleen W Perdue °

## 2011-03-17 ENCOUNTER — Encounter: Payer: Self-pay | Admitting: Pulmonary Disease

## 2011-05-14 DIAGNOSIS — Z8701 Personal history of pneumonia (recurrent): Secondary | ICD-10-CM

## 2011-05-14 HISTORY — DX: Personal history of pneumonia (recurrent): Z87.01

## 2011-05-18 ENCOUNTER — Ambulatory Visit (INDEPENDENT_AMBULATORY_CARE_PROVIDER_SITE_OTHER): Payer: 59 | Admitting: Adult Health

## 2011-05-18 ENCOUNTER — Encounter: Payer: Self-pay | Admitting: Adult Health

## 2011-05-18 DIAGNOSIS — J189 Pneumonia, unspecified organism: Secondary | ICD-10-CM

## 2011-05-18 NOTE — Patient Instructions (Signed)
Finish Avelox.  Mucinex DM Twice daily  As needed  Cough/congestion  Fluids and rest  follow up 2 weeks with chest xray and As needed   Please contact office for sooner follow up if symptoms do not improve or worsen or seek emergency care

## 2011-05-18 NOTE — Assessment & Plan Note (Addendum)
Recently diagnosed right sided pneumonia and urgent care on 05/12/2011 treated with Avelox x10 days along with Rocephin injection per urgent care notes. Patient seems to be clinically slowly improving. We'll continue out Avelox as planned. Patient may use albuterol nebulizer as needed. And does have Hycodan cough syrup if needed. Have advised her to use Mucinex DM twice daily as needed. Patient will follow back up in office in 2 weeks with chest x-ray to document clearance. Patient is advised if symptoms do not improve or worsen to please call our office for sooner followup or seek emergency care Pneumovax was done in 2006

## 2011-05-18 NOTE — Progress Notes (Signed)
Subjective:    Patient ID: Amy Williamson, female    DOB: [birth year], 60 y.o.   MRN: [id number]  HPI  60 y/o WF   ~  March 03, 2010:  she has had a good year medically but continues to be under mod stress having to care for her husb w/ end-stage MS (she gets 10-14 H per week help from [place])... recently she has had some plantar fasciitis & bone spur dx by DrZigler, Podiatry> given shot & brace...   ~  March 05, 2011:  Yearly CPX & Amy Williamson is holding up well, under lots of stress caring for husb Amy Williamson w/ MS;  BP remains controlled 7 she denies CP, palpit, dizzy, syncope, edema, etc;  Chol continues to look good on Simva40 + her diet & nice 11# wt loss this yr;  Mild reflux symptoms controlled on her Prilosec;  FM is still an issue but she copes very well...    Mammogram & Ultrasound 4/12 showed benign left breast cyst    [name] did right 4th trigger finger surg 6/12> improved  ~05/18/2011 UC follow up  Pt went to UC in [place] on 10/21 and was advised she had PNA and was told f/u here. Pt c/o wheezing, cough w/ yellow phlem w/ occasional streaks of blood, increase SOB, sweats, low grade fever, sinus congestion.  Patient was seen [facility] 05/12/2011 4 cough congestion x4 days. She was told that she had pneumonia, chest x-ray showed a right infiltrate per urgent care records. She was treated with Avelox for 10 days , Rocephin injection and Hycodan cough syrup.  Over the last few days patient is feeling slightly better. But still feels weak and sore in ribs esp on right.  Low grade fever on/off. Eating okay, appetite is fair .             PROBLEM LIST:   ABNORMAL CHEST XRAY (ICD-793.1) - she has a < 1cm calcif nodule seen in LLL- likely granuloma and without serial change... ~  CXR 6/09 showed no change... ~  CXR 3/10 showed borderline Cor size, NAD.[name]. ~  CXR 8/11 showed borderline cor, ectatic Ao, clear lungs, mild DJD spine. ~  CXR 8/12 showed  borderline cor, clear lungs, NAD...  HYPERTENSION (ICD-401.9) & Hx of PALPITATIONS (ICD-785.1) - on ASA 81mg /d + ATENOLOL 50mg /d... BP= 130/70 and asymptomatic... denies HA, fatigue, visual changes, CP, palipit, dizziness, syncope, dyspnea, edema, etc...  VENOUS INSUFFICIENCY (ICD-459.81) - she has mod VI changes and trace edema... she knows to elim sodium, elevate, wear TED hose, etc...  HYPERCHOLESTEROLEMIA (ICD-272.0) - on SIMVASTATIN 40mg /d... ~  FLP 6/08 showed TChol 138, Tg 75, HDL 49, LDL 74... continue same. ~  FLP 6/09 showed TChol 143, TG 98, HDL 55, LDL 68 ~  FLP 7/10 showed TChol 150, TG 104, HDL 56, LDL 73 ~  FLP 8/11 on Simva40 showed TChol 160, TG 119, HDL 56, LDL 80 ~  FLP 8/12 on Simva40 showed TChol 140, TG 76, HDL 59, LDL 66  OBESITY (ICD-278.00) - she is 5\' 1"  tall and her weights of 190 -220 w/ BMI 36 - 42 ~  weight 6/08 = 163# ~  weight 7/09 = 188# ~  weight 3/10 = 217# ~  weight 8/11 = 211# ~  Weight 8/12 = 200#  GASTROESOPHAGEAL REFLUX DISEASE (ICD-530.81) - uses OTC Prilosec as needed... ~  routine colonoscopy 9/03 by [name] was WNL... f/u planned 3yrs.  FIBROMYALGIA (ICD-729.1) - symptoms suggesting  FM in the past... ortho eval from [name]> right 4th trigger finger surg 6/12.  MIGRAINES, HX OF (ICD-V13.8) - she has hx of migraines and notes some HA when lying back & occiput touches pillow at night... headaches have improved over time, using TYLENOL Prn...  ANXIETY & DEPRESSION (ICD-311) - under mod stress caring for husb w/ end-stage MS... ~  7/10: notes that Hospice signed off & now getting "care management" w/ [facility]- 10H per week help at home... ALPRAZOLAM 0.5mg  Prn.  Health Maintenance - she had PNEUMOVAX 2006, TETANUS shot 2006, & yearly Flu shots... GYN= DrMiller for PAP, [facility], and BMD reported normal per pt... she takes Calcium, MVI, Vit D supplement...   Current Medications, Allergies, Past Medical  History, Past Surgical History, Family History, and Social History were reviewed in [employer] record.   No past surgical history on file.   Outpatient Encounter Prescriptions as of 05/18/2011  Medication Sig Dispense Refill  . albuterol (PROVENTIL) (2.5 MG/3ML) 0.083% nebulizer solution 1 vial 2-3 times a day      . ALPRAZolam (XANAX) 0.5 MG tablet Take 1/2 to 1 tablet three times a day as needed.  90 tablet  5  . Ascorbic Acid (VITAMIN C) 1000 MG tablet Take 1,000 mg by mouth daily.        [name] aspirin 81 MG tablet Take 81 mg by mouth daily.        [name] atenolol (TENORMIN) 50 MG tablet Take 1 tablet (50 mg total) by mouth daily.  31 tablet  11  . AVELOX 400 MG tablet Once a day      . calcium carbonate (OS-CAL) 600 MG TABS Take 600 mg by mouth daily.        [name] HYDROcodone-homatropine (HYCODAN) 5-1.5 MG/5ML syrup 1 tsp as needed      . Multiple Vitamin (MULTIVITAMIN) tablet Take 1 tablet by mouth daily.        . simvastatin (ZOCOR) 40 MG tablet Take 1 tablet (40 mg total) by mouth at bedtime.  31 tablet  11  . vitamin D, CHOLECALCIFEROL, 400 UNITS tablet Take 400 Units by mouth daily.          No Known Allergies   Review of Systems Constitutional:   No  weight loss, night sweats,  + Fevers, chills, fatigue, or  lassitude.  HEENT:   No headaches,  Difficulty swallowing,  Tooth/dental problems, or  Sore throat,                No sneezing, itching, ear ache,  +nasal congestion, post nasal drip,   CV:  No chest pain,  Orthopnea, PND, swelling in lower extremities, anasarca, dizziness, palpitations, syncope.   GI  No heartburn, indigestion, abdominal pain, nausea, vomiting, diarrhea, change in bowel habits, loss of appetite, bloody stools.   Resp:   No coughing up of blood.  No chest wall deformity  Skin: no rash or lesions.  GU: no dysuria, change in color of urine, no urgency or frequency.  No flank pain, no hematuria   MS:  No joint pain or swelling.  No  decreased range of motion.  No back pain.  Psych:  No change in mood or affect. No depression or anxiety.  No memory loss.                 Objective:   Physical Exam    WD, WN, 60 y/o WF in NAD... GENERAL:  Alert & oriented; pleasant & cooperative... HEENT:  [place]/AT, EOM-wnl, PERRLA, EACs-clear, TMs-wnl, NOSE-clear, THROAT-clear & wnl. NECK:  Supple w/ full ROM; no JVD; normal carotid impulses w/o bruits; no thyromegaly or nodules palpated; no lymphadenopathy. CHEST:  Coarse BS w/ no wheezing . HEART:  Regular Rhythm; without murmurs/ rubs/ or gallops. ABDOMEN:  Soft & nontender; normal bowel sounds; no organomegaly or masses detected. EXT: without deformities, mild arthritic changes; no varicose veins/ venous insuffic/ or edema. NEURO:  gait normal & balance OK. DERM:  No lesions noted; no rash etc...   Assessment & Plan:

## 2011-05-29 ENCOUNTER — Ambulatory Visit (INDEPENDENT_AMBULATORY_CARE_PROVIDER_SITE_OTHER): Payer: 59 | Admitting: Pulmonary Disease

## 2011-05-29 ENCOUNTER — Ambulatory Visit (INDEPENDENT_AMBULATORY_CARE_PROVIDER_SITE_OTHER)
Admission: RE | Admit: 2011-05-29 | Discharge: 2011-05-29 | Disposition: A | Discharge status: Home / Self Care | Payer: 59 | Source: Ambulatory Visit | Attending: Pulmonary Disease | Admitting: Pulmonary Disease

## 2011-05-29 ENCOUNTER — Encounter: Payer: Self-pay | Admitting: Pulmonary Disease

## 2011-05-29 VITALS — BP 146/86 | HR 54 | Temp 99.2°F | Ht 60.5 in | Wt 203.4 lb

## 2011-05-29 DIAGNOSIS — K219 Gastro-esophageal reflux disease without esophagitis: Secondary | ICD-10-CM

## 2011-05-29 DIAGNOSIS — E7800 Pure hypercholesterolemia, unspecified: Secondary | ICD-10-CM

## 2011-05-29 DIAGNOSIS — J189 Pneumonia, unspecified organism: Secondary | ICD-10-CM

## 2011-05-29 DIAGNOSIS — I10 Essential (primary) hypertension: Secondary | ICD-10-CM

## 2011-05-29 NOTE — Patient Instructions (Signed)
Today we updated your med list in our EPIC system...    Continue your current medications the same...  Your CXR today is clear & looks just like your baseline film taking during your last physical...  You may use the HYCODAN cough syrup as needed & consider using the OTC MUCINEX as needed...  Call for any problems.Marland KitchenMarland Kitchen

## 2011-05-29 NOTE — Progress Notes (Signed)
Subjective:    Patient ID: Amy Williamson, female    DOB: May 22, 1951, 60 y.o.   MRN: [id number]  HPI  60 y/o WF here for a yearly follow up visit...   ~  March 03, 2010:  she has had a good year medically but continues to be under mod stress having to care for her husb w/ end-stage MS (she gets 10-14 H per week help from [place])... recently she has had some plantar fasciitis & bone spur dx by DrZigler, Podiatry> given shot & brace...   ~  March 05, 2011:  Yearly CPX & Amy Williamson is holding up well, under lots of stress caring for husb [name] w/ MS;  BP remains controlled & she denies CP, palpit, dizzy, syncope, edema, etc;  Chol continues to look good on Simva40 + her diet & nice 11# wt loss this yr;  Mild reflux symptoms controlled on her Prilosec;  FM is still an issue but she copes very well...    Mammogram & Ultrasound 4/12 showed benign left breast cyst...    [name] did right 4th trigger finger surg 6/12> improved...  ~  May 29, 2011:  63mo ROV & add-on to follow up recent pneumonia> she went to [name] 10/21 w/ cough, yellow sput w/ streaky hemoptysis, low grade fever, congestion , SOB, some wheezing noted; CXR reported to show infiltrate on right & she was givenRocephin IM & Avelox po, plus Hycodan cough syrup; she improved slowly on this regimen, had some sore ribs & lingering symptoms; she saw TP 10/26 & was sl improved, asked to continue meds & f/u w/ me today> sl dry cough persists and min clear sputum, still sl weak & recouperating slowly; CXR today is clear & ident to her CPX film 03/05/11... NOTE: pt had Pneumovax 2006... She also tells me that [name] will be attended by the group: [facility] [place]...           PROBLEM LIST:   ABNORMAL CHEST XRAY (ICD-793.1) - she has a < 1cm calcif nodule seen in LLL- likely granuloma and without serial change... ~  CXR 6/09 showed no change... ~  CXR 3/10 showed borderline Cor size, NAD.[name]. ~  CXR 8/11 showed  borderline cor, ectatic Ao, clear lungs, mild DJD spine. ~  CXR 8/12 showed borderline cor, clear lungs, NAD... ~  10/12:  She developed cough, sput w/ streaky hemoptysis, SOB, etc; went to [facility] w/ CXR showing right pneumonia; treated w/ IM Rocephin & po Avelox; f/u here & slowly improved w/ CXR 05/29/11 clear & back to baseline...  HYPERTENSION (ICD-401.9) & Hx of PALPITATIONS (ICD-785.1) - on ASA 81mg /d + ATENOLOL 50mg /d...  ~  8/12:  BP= 130/70 and asymptomatic... denies HA, fatigue, visual changes, CP, palipit, dizziness, syncope, dyspnea, edema, etc... ~  11/12:  BP= 146/86 & she is improving clinically from her recent bout of pneumonia.  VENOUS INSUFFICIENCY (ICD-459.81) - she has mod VI changes and trace edema... she knows to elim sodium, elevate, wear TED hose, etc...  HYPERCHOLESTEROLEMIA (ICD-272.0) - on SIMVASTATIN 40mg /d... ~  FLP 6/08 showed TChol 138, Tg 75, HDL 49, LDL 74... continue same. ~  FLP 6/09 showed TChol 143, TG 98, HDL 55, LDL 68 ~  FLP 7/10 showed TChol 150, TG 104, HDL 56, LDL 73 ~  FLP 8/11 on Simva40 showed TChol 160, TG 119, HDL 56, LDL 80 ~  FLP 8/12 on Simva40 showed TChol 140, TG 76, HDL 59, LDL 66  OBESITY (ICD-278.00) -  she is 5\' 1"  tall and her weights of 190 -220 w/ BMI 36 - 42 ~  weight 6/08 = 163# ~  weight 7/09 = 188# ~  weight 3/10 = 217# ~  weight 8/11 = 211# ~  Weight 8/12 = 200#  GASTROESOPHAGEAL REFLUX DISEASE (ICD-530.81) - uses OTC Prilosec as needed... ~  routine colonoscopy 9/03 by [name] was WNL... f/u planned 17yrs.  FIBROMYALGIA (ICD-729.1) - symptoms suggesting FM in the past... ortho eval from [name]> right 4th trigger finger surg 6/12.  MIGRAINES, HX OF (ICD-V13.8) - she has hx of migraines and notes some HA when lying back & occiput touches pillow at night... headaches have improved over time, using TYLENOL Prn...  ANXIETY & DEPRESSION (ICD-311) - under mod stress caring for husb w/ end-stage MS... ~  7/10: notes that  Hospice signed off & now getting "care management" w/ [facility]- 10H per week help at home... ALPRAZOLAM 0.5mg  Prn.  Health Maintenance - she had PNEUMOVAX 2006, TETANUS shot 2006, & yearly Flu shots... GYN= DrMiller for PAP, [facility], and BMD reported normal per pt... she takes Calcium, MVI, Vit D supplement...   No past surgical history on file.   Outpatient Encounter Prescriptions as of 05/29/2011  Medication Sig Dispense Refill  . albuterol (PROVENTIL) (2.5 MG/3ML) 0.083% nebulizer solution 1 vial 2-3 times a day      . ALPRAZolam (XANAX) 0.5 MG tablet Take 1/2 to 1 tablet three times a day as needed.  90 tablet  5  . Ascorbic Acid (VITAMIN C) 1000 MG tablet Take 1,000 mg by mouth daily.        [name] aspirin 81 MG tablet Take 81 mg by mouth daily.        [name] atenolol (TENORMIN) 50 MG tablet Take 1 tablet (50 mg total) by mouth daily.  31 tablet  11  . calcium carbonate (OS-CAL) 600 MG TABS Take 600 mg by mouth daily.        [name] HYDROcodone-homatropine (HYCODAN) 5-1.5 MG/5ML syrup 1 tsp as needed      . Multiple Vitamin (MULTIVITAMIN) tablet Take 1 tablet by mouth daily.        . simvastatin (ZOCOR) 40 MG tablet Take 1 tablet (40 mg total) by mouth at bedtime.  31 tablet  11  . vitamin D, CHOLECALCIFEROL, 400 UNITS tablet Take 400 Units by mouth daily.        [name] DISCONTD: AVELOX 400 MG tablet Once a day        No Known Allergies   Current Medications, Allergies, Past Medical History, Past Surgical History, Family History, and Social History were reviewed in [employer] record.    Review of Systems         The patient complains of sl cough, fatigue, dyspnea on exertion, and depression.  The patient denies fever, chills, sweats, anorexia, weakness, malaise, weight loss, sleep disorder, blurring, diplopia, eye irritation, eye discharge, vision loss, eye pain, photophobia, earache, ear discharge, tinnitus, decreased hearing, nasal  congestion, nosebleeds, sore throat, hoarseness, chest pain, palpitations, syncope, orthopnea, PND, peripheral edema, dyspnea at rest, excessive sputum, hemoptysis, wheezing, pleurisy, nausea, vomiting, diarrhea, constipation, change in bowel habits, abdominal pain, melena, hematochezia, jaundice, gas/bloating, indigestion/heartburn, dysphagia, odynophagia, dysuria, hematuria, urinary frequency, urinary hesitancy, nocturia, incontinence, back pain, joint pain, joint swelling, muscle cramps, muscle weakness, stiffness, arthritis, sciatica, restless legs, leg pain at night, leg pain with exertion, rash, itching, dryness, suspicious lesions, paralysis, paresthesias, seizures, tremors,  vertigo, transient blindness, frequent falls, frequent headaches, difficulty walking, anxiety, memory loss, confusion, cold intolerance, heat intolerance, polydipsia, polyphagia, polyuria, unusual weight change, abnormal bruising, bleeding, enlarged lymph nodes, urticaria, allergic rash, hay fever, and recurrent infections.     Objective:   Physical Exam    WD, WN, 60 y/o WF in NAD... GENERAL:  Alert & oriented; pleasant & cooperative... HEENT:  [place]/AT, EOM-wnl, PERRLA, EACs-clear, TMs-wnl, NOSE-clear, THROAT-clear & wnl. NECK:  Supple w/ full ROM; no JVD; normal carotid impulses w/o bruits; no thyromegaly or nodules palpated; no lymphadenopathy. CHEST:  Clear to P & A; without wheezes/ rales/ or rhonchi. HEART:  Regular Rhythm; without murmurs/ rubs/ or gallops. ABDOMEN:  Soft & nontender; normal bowel sounds; no organomegaly or masses detected. EXT: without deformities, mild arthritic changes; no varicose veins/ venous insuffic/ or edema. NEURO:  CN's intact; motor testing normal; sensory testing normal; gait normal & balance OK. DERM:  No lesions noted; no rash etc...   Assessment & Plan:   s/p PNEUMONIA>  She is reported to have had a right sided infiltrate (we don't have films); now resolved after antibiotic Rx;  slowing improving clinically & rec- rest, fluids, tylenol, mucinex, etc...  Hx of Abnormal CXR> <1cm LLL calcif nodule= likely granuloma, no serial changes...  HBP>  Remains well controlled on Atenolol 50mg /d...  Ven Insuffic>  Be sure to avaoid sodium, elev legs, wera support hose as discussed..  CHOL>  FLP continues to look good on the simva40...  Obesity>  Weight is down 5% to 200#  & clinically improved...  GERD>  She uses Prilosec OTC as needed...  FM>  She manages well & advised Yoga, stretching, etc...  Anxiety & Depression>  She has Alpraz for prn use, but [place].Marland KitchenMarland Kitchen

## 2011-06-07 ENCOUNTER — Telehealth: Payer: Self-pay | Admitting: Pulmonary Disease

## 2011-06-07 ENCOUNTER — Encounter: Payer: Self-pay | Admitting: Pulmonary Disease

## 2011-06-07 MED ORDER — AZITHROMYCIN 250 MG PO TABS: ORAL_TABLET | ORAL | Status: AC

## 2011-06-07 MED ORDER — PREDNISONE (PAK) 5 MG PO TABS: ORAL_TABLET | ORAL | Status: DC

## 2011-06-07 NOTE — Telephone Encounter (Signed)
Per SN---zpak #1  Take as directed, pred dosepak  5 mg   6 day pack #1  As directed, cont the mucinex 2 po bid with plenty of fluids and take delsym 2 tsp bid if needed.  Called and spoke with pt and she is aware of meds sent to the pharmacy. And will call back if not getting better.

## 2011-06-07 NOTE — Telephone Encounter (Signed)
I spoke with Amy Williamson and she c/o hoarseness, cough w/ yellow phlem and streaks of blood in it, nasal congestion, drainage down back of the throat, chills. Amy Williamson denies any fever, nausea, vomiting. Amy Williamson states she started mucinex dm today and is still taking the cough syrup. Amy Williamson is requesting further recs from Dr. Kriste Basque. Please advise, thanks  No Known Allergies   Carver Fila, CMA

## 2011-06-19 ENCOUNTER — Encounter (INDEPENDENT_AMBULATORY_CARE_PROVIDER_SITE_OTHER): Payer: 59 | Admitting: *Deleted

## 2011-06-19 ENCOUNTER — Encounter: Payer: Self-pay | Admitting: Adult Health

## 2011-06-19 ENCOUNTER — Ambulatory Visit (INDEPENDENT_AMBULATORY_CARE_PROVIDER_SITE_OTHER): Payer: 59 | Admitting: Adult Health

## 2011-06-19 VITALS — BP 134/68 | HR 58 | Temp 99.5°F | Ht 61.0 in | Wt 202.0 lb

## 2011-06-19 DIAGNOSIS — R29898 Other symptoms and signs involving the musculoskeletal system: Secondary | ICD-10-CM

## 2011-06-19 DIAGNOSIS — M79609 Pain in unspecified limb: Secondary | ICD-10-CM

## 2011-06-19 DIAGNOSIS — R2240 Localized swelling, mass and lump, unspecified lower limb: Secondary | ICD-10-CM

## 2011-06-19 DIAGNOSIS — R229 Localized swelling, mass and lump, unspecified: Secondary | ICD-10-CM

## 2011-06-19 NOTE — Patient Instructions (Addendum)
Elevate leg/knee, warm compresses As needed   We are setting you up for an Ultrasound of of your leg Tylenol As needed  Pain  Please contact office for sooner follow up if symptoms do not improve or worsen or seek emergency care   Late add: refer to ortho

## 2011-06-21 ENCOUNTER — Telehealth: Payer: Self-pay | Admitting: *Deleted

## 2011-06-21 ENCOUNTER — Encounter: Payer: Self-pay | Admitting: Adult Health

## 2011-06-21 NOTE — Telephone Encounter (Signed)
I called [facility] on 11.28.12 to schedule sooner appt for patient per TP's ov recs.  No sooner appts than that date and was told by [facility] that Dr Aplington's NP does not see patients separately.  Receptionist was to check other provider schedules and call back.  Received phone call from [place] later that afternoon on 11.28.12 and was told that [facility] had called her to inform us that they were able to schedule patient with another provider in the practice but when patient was called with the appt info, patient declined seeing any other provider and wanted to wait until 12.4.12 against ov recs.  TP is aware.  Will sign off on message.

## 2011-06-21 NOTE — Progress Notes (Signed)
Subjective:    Patient ID: Amy Williamson, female    DOB: 13-Feb-1951, 60 y.o.   MRN: [id number]  HPI  60 y/o WF with known hx of HTN , GERD,  And FM   ~  March 03, 2010:  she has had a good year medically but continues to be under mod stress having to care for her husb w/ end-stage MS (she gets 10-14 H per week help from [place])... recently she has had some plantar fasciitis & bone spur dx by DrZigler, Podiatry> given shot & brace...   ~  March 05, 2011:  Yearly CPX & [place] is holding up well, under lots of stress caring for husb Amy Williamson w/ MS;  BP remains controlled & she denies CP, palpit, dizzy, syncope, edema, etc;  Chol continues to look good on Simva40 + her diet & nice 11# wt loss this yr;  Mild reflux symptoms controlled on her Prilosec;  FM is still an issue but she copes very well...    Mammogram & Ultrasound 4/12 showed benign left breast cyst...    [name] did right 4th trigger finger surg 6/12> improved...  ~  May 29, 2011:  60mo ROV & add-on to follow up recent pneumonia> she went to [name] 10/21 w/ cough, yellow sput w/ streaky hemoptysis, low grade fever, congestion , SOB, some wheezing noted; CXR reported to show infiltrate on right & she was givenRocephin IM & Avelox po, plus Hycodan cough syrup; she improved slowly on this regimen, had some sore ribs & lingering symptoms; she saw TP 10/26 & was sl improved, asked to continue meds & f/u w/ me today> sl dry cough persists and min clear sputum, still sl weak & recouperating slowly; CXR today is clear & ident to her CPX film 03/05/11... NOTE: pt had Pneumovax 2006... She also tells me that Amy Williamson will be attended by the group: [facility] [place]...           PROBLEM LIST:   ABNORMAL CHEST XRAY (ICD-793.1) - she has a < 1cm calcif nodule seen in LLL- likely granuloma and without serial change... ~  CXR 6/09 showed no change... ~  CXR 3/10 showed borderline Cor size, NAD.[name]. ~  CXR 8/11 showed  borderline cor, ectatic Ao, clear lungs, mild DJD spine. ~  CXR 8/12 showed borderline cor, clear lungs, NAD... ~  10/12:  She developed cough, sput w/ streaky hemoptysis, SOB, etc; went to [facility] w/ CXR showing right pneumonia; treated w/ IM Rocephin & po Avelox; f/u here & slowly improved w/ CXR 05/29/11 clear & back to baseline...  HYPERTENSION (ICD-401.9) & Hx of PALPITATIONS (ICD-785.1) - on ASA 81mg /d + ATENOLOL 50mg /d...  ~  8/12:  BP= 130/70 and asymptomatic... denies HA, fatigue, visual changes, CP, palipit, dizziness, syncope, dyspnea, edema, etc... ~  11/12:  BP= 146/86 & she is improving clinically from her recent bout of pneumonia.  VENOUS INSUFFICIENCY (ICD-459.81) - she has mod VI changes and trace edema... she knows to elim sodium, elevate, wear TED hose, etc...  HYPERCHOLESTEROLEMIA (ICD-272.0) - on SIMVASTATIN 40mg /d... ~  FLP 6/08 showed TChol 138, Tg 75, HDL 49, LDL 74... continue same. ~  FLP 6/09 showed TChol 143, TG 98, HDL 55, LDL 68 ~  FLP 7/10 showed TChol 150, TG 104, HDL 56, LDL 73 ~  FLP 8/11 on Simva40 showed TChol 160, TG 119, HDL 56, LDL 80 ~  FLP 8/12 on Simva40 showed TChol 140, TG 76, HDL 59, LDL 66  OBESITY (ICD-278.00) - she is 5\' 1"  tall and her weights of 190 -220 w/ BMI 36 - 42 ~  weight 6/08 = 163# ~  weight 7/09 = 188# ~  weight 3/10 = 217# ~  weight 8/11 = 211# ~  Weight 8/12 = 200#  GASTROESOPHAGEAL REFLUX DISEASE (ICD-530.81) - uses OTC Prilosec as needed... ~  routine colonoscopy 9/03 by [name] was WNL... f/u planned 70yrs.  FIBROMYALGIA (ICD-729.1) - symptoms suggesting FM in the past... ortho eval from [name]> right 4th trigger finger surg 6/12.  MIGRAINES, HX OF (ICD-V13.8) - she has hx of migraines and notes some HA when lying back & occiput touches pillow at night... headaches have improved over time, using TYLENOL Prn...  ANXIETY & DEPRESSION (ICD-311) - under mod stress caring for husb w/ end-stage MS... ~  7/10: notes that  Hospice signed off & now getting "care management" w/ [facility]- 10H per week help at home... ALPRAZOLAM 0.5mg  Prn.  Health Maintenance - she had PNEUMOVAX 2006, TETANUS shot 2006, & yearly Flu shots... GYN= DrMiller for PAP, [facility], and BMD reported normal per pt... she takes Calcium, MVI, Vit D supplement...   No past surgical history on file.   Outpatient Encounter Prescriptions as of 06/19/2011  Medication Sig Dispense Refill  . albuterol (PROVENTIL) (2.5 MG/3ML) 0.083% nebulizer solution 1 vial 2-3 times a day      . ALPRAZolam (XANAX) 0.5 MG tablet Take 1/2 to 1 tablet three times a day as needed.  90 tablet  5  . Ascorbic Acid (VITAMIN C) 1000 MG tablet Take 1,000 mg by mouth daily.        [name] aspirin 81 MG tablet Take 81 mg by mouth daily.        [name] atenolol (TENORMIN) 50 MG tablet Take 1 tablet (50 mg total) by mouth daily.  31 tablet  11  . calcium carbonate (OS-CAL) 600 MG TABS Take 600 mg by mouth daily.        [name] HYDROcodone-homatropine (HYCODAN) 5-1.5 MG/5ML syrup 1 tsp as needed      . Multiple Vitamin (MULTIVITAMIN) tablet Take 1 tablet by mouth daily.        . predniSONE (STERAPRED UNI-PAK) 5 MG TABS Take as directed  1 each  0  . simvastatin (ZOCOR) 40 MG tablet Take 1 tablet (40 mg total) by mouth at bedtime.  31 tablet  11  . vitamin D, CHOLECALCIFEROL, 400 UNITS tablet Take 400 Units by mouth daily.          No Known Allergies   Current Medications, Allergies, Past Medical History, Past Surgical History, Family History, and Social History were reviewed in [employer] record.    Review of Systems         Constitutional:   No  weight loss, night sweats,  Fevers, chills, ++ fatigue, or  lassitude.  HEENT:   No headaches,  Difficulty swallowing,  Tooth/dental problems, or  Sore throat,                No sneezing, itching, ear ache, nasal congestion, post nasal drip,   CV:  No chest pain,  Orthopnea, PND,  swelling in lower extremities, anasarca, dizziness, palpitations, syncope.   GI  No heartburn, indigestion, abdominal pain, nausea, vomiting, diarrhea, change in bowel habits, loss of appetite, bloody stools.   Resp: No shortness of breath with exertion or at rest.  No excess mucus, no productive cough,  No non-productive cough,  No coughing up of blood.  No change in color of mucus.  No wheezing.  No chest wall deformity  Skin: no rash or lesions.  GU: no dysuria, change in color of urine, no urgency or frequency.  No flank pain, no hematuria   MS:  + joint pain or swelling.  No decreased range of motion.  No back pain.  Psych:  No change in mood or affect. No depression or anxiety.  No memory loss.       Objective:   Physical Exam    WD, WN, 60 y/o WF in NAD... GENERAL:  Alert & oriented; pleasant & cooperative... HEENT:  [place]/AT, EOM-wnl, PERRLA, EACs-clear, TMs-wnl, NOSE-clear, THROAT-clear & wnl. NECK:  Supple w/ full ROM; no JVD; normal carotid impulses w/o bruits; no thyromegaly or nodules palpated; no lymphadenopathy. CHEST:  Clear to P & A; without wheezes/ rales/ or rhonchi. HEART:  Regular Rhythm; without murmurs/ rubs/ or gallops. ABDOMEN:  Soft & nontender; normal bowel sounds; no organomegaly or masses detected. EXT: without deformities, mild arthritic changes; no varicose veins/ venous insuffic/ or edema. Along posterior knee-popliteal space hard mass ~ 2.5cm area , tender to touch , no redness. No bruit noted. Knee ROM nml- mild pain with flexion, no calf pain or redness, neg homans  NEURO:   motor testing normal; sensory testing normal; gait normal & balance OK. DERM:  No lesions noted; no rash etc...   Assessment & Plan:

## 2011-06-21 NOTE — Assessment & Plan Note (Addendum)
Left Popliteal tender nodule/mass ? Etiology  Venous doppler neg for DVT /Bakers cyst (call report )    Will refer to ortho for evaluation. Pt is a pt of [facility] Dr Leslee [facility].  OV being set up for this week for evaluation  Advised to keep leg elevated  Elevate leg/knee, alternate ice and heat As needed     Advil or Tylenol As needed  Pain  Please contact office for sooner follow up if symptoms do not improve or worsen or seek emergency care

## 2011-07-14 ENCOUNTER — Other Ambulatory Visit: Payer: Self-pay | Admitting: Orthopedic Surgery

## 2011-07-14 DIAGNOSIS — J40 Bronchitis, not specified as acute or chronic: Secondary | ICD-10-CM

## 2011-07-14 HISTORY — DX: Bronchitis, not specified as acute or chronic: J40

## 2011-08-01 ENCOUNTER — Encounter (HOSPITAL_BASED_OUTPATIENT_CLINIC_OR_DEPARTMENT_OTHER): Payer: Self-pay | Admitting: *Deleted

## 2011-08-01 NOTE — Progress Notes (Signed)
NPO AFTER MN. ARRIVES AT 1030. NEEDS ISTAT . CURRENT CXR 06-01-11 AND EKG  03-05-11 IN EPIC. WILL TAKE ATENOLOL AM OF SURG W/ SIP OF WATER. REVIEWED [facility] GUIDELINES, WILL BRING MED.

## 2011-08-03 ENCOUNTER — Ambulatory Visit (HOSPITAL_BASED_OUTPATIENT_CLINIC_OR_DEPARTMENT_OTHER): Payer: 59 | Admitting: Anesthesiology

## 2011-08-03 ENCOUNTER — Encounter (HOSPITAL_BASED_OUTPATIENT_CLINIC_OR_DEPARTMENT_OTHER)
Admission: RE | Disposition: A | Discharge status: Home / Self Care | Payer: Self-pay | Source: Ambulatory Visit | Attending: Orthopedic Surgery

## 2011-08-03 ENCOUNTER — Encounter (HOSPITAL_BASED_OUTPATIENT_CLINIC_OR_DEPARTMENT_OTHER): Payer: Self-pay | Admitting: Anesthesiology

## 2011-08-03 ENCOUNTER — Other Ambulatory Visit: Payer: Self-pay | Admitting: Orthopedic Surgery

## 2011-08-03 ENCOUNTER — Encounter (HOSPITAL_BASED_OUTPATIENT_CLINIC_OR_DEPARTMENT_OTHER): Payer: Self-pay | Admitting: *Deleted

## 2011-08-03 ENCOUNTER — Ambulatory Visit (HOSPITAL_BASED_OUTPATIENT_CLINIC_OR_DEPARTMENT_OTHER)
Admission: RE | Admit: 2011-08-03 | Discharge: 2011-08-04 | Disposition: A | Discharge status: Home / Self Care | Payer: 59 | Source: Ambulatory Visit | Attending: Orthopedic Surgery | Admitting: Orthopedic Surgery

## 2011-08-03 DIAGNOSIS — R002 Palpitations: Secondary | ICD-10-CM | POA: Insufficient documentation

## 2011-08-03 DIAGNOSIS — Z79899 Other long term (current) drug therapy: Secondary | ICD-10-CM | POA: Insufficient documentation

## 2011-08-03 DIAGNOSIS — IMO0002 Reserved for concepts with insufficient information to code with codable children: Secondary | ICD-10-CM | POA: Insufficient documentation

## 2011-08-03 DIAGNOSIS — E7800 Pure hypercholesterolemia, unspecified: Secondary | ICD-10-CM | POA: Insufficient documentation

## 2011-08-03 DIAGNOSIS — K219 Gastro-esophageal reflux disease without esophagitis: Secondary | ICD-10-CM | POA: Insufficient documentation

## 2011-08-03 DIAGNOSIS — Z7982 Long term (current) use of aspirin: Secondary | ICD-10-CM | POA: Insufficient documentation

## 2011-08-03 DIAGNOSIS — S83289A Other tear of lateral meniscus, current injury, unspecified knee, initial encounter: Secondary | ICD-10-CM | POA: Insufficient documentation

## 2011-08-03 DIAGNOSIS — M7120 Synovial cyst of popliteal space [Baker], unspecified knee: Secondary | ICD-10-CM | POA: Insufficient documentation

## 2011-08-03 DIAGNOSIS — R2240 Localized swelling, mass and lump, unspecified lower limb: Secondary | ICD-10-CM

## 2011-08-03 DIAGNOSIS — X58XXXA Exposure to other specified factors, initial encounter: Secondary | ICD-10-CM | POA: Insufficient documentation

## 2011-08-03 DIAGNOSIS — E669 Obesity, unspecified: Secondary | ICD-10-CM | POA: Insufficient documentation

## 2011-08-03 DIAGNOSIS — I10 Essential (primary) hypertension: Secondary | ICD-10-CM | POA: Insufficient documentation

## 2011-08-03 HISTORY — DX: Bronchitis, not specified as acute or chronic: J40

## 2011-08-03 HISTORY — PX: EAR CYST EXCISION: SHX22

## 2011-08-03 HISTORY — DX: Personal history of pneumonia (recurrent): Z87.01

## 2011-08-03 HISTORY — DX: Unspecified osteoarthritis, unspecified site: M19.90

## 2011-08-03 HISTORY — PX: KNEE ARTHROSCOPY W/ MENISCAL REPAIR: SHX1877

## 2011-08-03 HISTORY — DX: Unspecified cataract: H26.9

## 2011-08-03 HISTORY — DX: Personal history of other diseases of the nervous system and sense organs: Z86.69

## 2011-08-03 HISTORY — DX: Personal history of other diseases of the digestive system: Z87.19

## 2011-08-03 LAB — POCT I-STAT 4, (NA,K, GLUC, HGB,HCT)
Hemoglobin: 13.3 g/dL (ref 12.0–15.0)
Potassium: 4 mEq/L (ref 3.5–5.1)
Sodium: 144 mEq/L (ref 135–145)

## 2011-08-03 SURGERY — CYST REMOVAL
Anesthesia: General | Site: Knee | Wound class: Clean

## 2011-08-03 MED ORDER — ONDANSETRON HCL 4 MG/2ML IJ SOLN
INTRAMUSCULAR | Status: DC | PRN
  Administered 2011-08-03: 4 mg via INTRAVENOUS

## 2011-08-03 MED ORDER — LACTATED RINGERS IV SOLN
INTRAVENOUS | Status: DC
  Administered 2011-08-03: 100 mL/h via INTRAVENOUS
  Administered 2011-08-03: 50 mL/h via INTRAVENOUS

## 2011-08-03 MED ORDER — OXYCODONE-ACETAMINOPHEN 5-325 MG PO TABS
1.0000 | ORAL_TABLET | ORAL | Status: DC | PRN
  Administered 2011-08-03: 1 via ORAL

## 2011-08-03 MED ORDER — METHOCARBAMOL 500 MG PO TABS: 500.0000 mg | ORAL_TABLET | Freq: Four times a day (QID) | ORAL | Status: AC

## 2011-08-03 MED ORDER — POVIDONE-IODINE 7.5 % EX SOLN: Freq: Once | CUTANEOUS | Status: DC

## 2011-08-03 MED ORDER — METHOCARBAMOL 500 MG PO TABS
500.0000 mg | ORAL_TABLET | Freq: Four times a day (QID) | ORAL | Status: DC | PRN
  Administered 2011-08-03: 500 mg via ORAL

## 2011-08-03 MED ORDER — GLYCOPYRROLATE 0.2 MG/ML IJ SOLN
INTRAMUSCULAR | Status: DC | PRN
  Administered 2011-08-03: 0.2 mg via INTRAVENOUS

## 2011-08-03 MED ORDER — INDIGOTINDISULFONATE SODIUM 8 MG/ML IJ SOLN
INTRAMUSCULAR | Status: DC | PRN
  Administered 2011-08-03: 8 mg via INTRAVENOUS

## 2011-08-03 MED ORDER — SODIUM CHLORIDE 0.9 % IR SOLN
Status: DC | PRN
  Administered 2011-08-03: 500 mL

## 2011-08-03 MED ORDER — FENTANYL CITRATE 0.05 MG/ML IJ SOLN
25.0000 ug | INTRAMUSCULAR | Status: DC | PRN
  Administered 2011-08-03 (×2): 50 ug via INTRAVENOUS

## 2011-08-03 MED ORDER — OXYCODONE-ACETAMINOPHEN 7.5-325 MG PO TABS: 1.0000 | ORAL_TABLET | ORAL | Status: AC | PRN

## 2011-08-03 MED ORDER — MORPHINE SULFATE 4 MG/ML IJ SOLN
INTRAMUSCULAR | Status: DC | PRN
  Administered 2011-08-03: 13:00:00 via INTRA_ARTICULAR

## 2011-08-03 MED ORDER — KETOROLAC TROMETHAMINE 30 MG/ML IJ SOLN
INTRAMUSCULAR | Status: DC | PRN
  Administered 2011-08-03: 30 mg via INTRAVENOUS

## 2011-08-03 MED ORDER — PROPOFOL 10 MG/ML IV EMUL
INTRAVENOUS | Status: DC | PRN
  Administered 2011-08-03: 200 mg via INTRAVENOUS

## 2011-08-03 MED ORDER — SODIUM CHLORIDE 0.9 % IR SOLN
Status: DC | PRN
  Administered 2011-08-03: 13:00:00

## 2011-08-03 MED ORDER — METHOCARBAMOL 500 MG PO TABS: 500.0000 mg | ORAL_TABLET | Freq: Four times a day (QID) | ORAL | Status: DC

## 2011-08-03 MED ORDER — STERILE WATER FOR IRRIGATION IR SOLN
Status: DC | PRN
  Administered 2011-08-03: 500 mL

## 2011-08-03 MED ORDER — FENTANYL CITRATE 0.05 MG/ML IJ SOLN
INTRAMUSCULAR | Status: DC | PRN
  Administered 2011-08-03 (×4): 50 ug via INTRAVENOUS

## 2011-08-03 MED ORDER — LIDOCAINE HCL (CARDIAC) 20 MG/ML IV SOLN
INTRAVENOUS | Status: DC | PRN
  Administered 2011-08-03: 60 mg via INTRAVENOUS

## 2011-08-03 MED ORDER — DEXAMETHASONE SODIUM PHOSPHATE 4 MG/ML IJ SOLN
INTRAMUSCULAR | Status: DC | PRN
  Administered 2011-08-03: 8 mg via INTRAVENOUS

## 2011-08-03 MED ORDER — SUCCINYLCHOLINE CHLORIDE 20 MG/ML IJ SOLN
INTRAMUSCULAR | Status: DC | PRN
  Administered 2011-08-03: 100 mg via INTRAVENOUS

## 2011-08-03 MED ORDER — MIDAZOLAM HCL 5 MG/5ML IJ SOLN
INTRAMUSCULAR | Status: DC | PRN
  Administered 2011-08-03: 1 mg via INTRAVENOUS

## 2011-08-03 MED ORDER — EPHEDRINE SULFATE 50 MG/ML IJ SOLN
INTRAMUSCULAR | Status: DC | PRN
  Administered 2011-08-03: 10 mg via INTRAVENOUS

## 2011-08-03 MED ORDER — KETOROLAC TROMETHAMINE 30 MG/ML IJ SOLN
15.0000 mg | Freq: Once | INTRAMUSCULAR | Status: AC | PRN
  Administered 2011-08-03: 30 mg via INTRAVENOUS

## 2011-08-03 SURGICAL SUPPLY — 85 items
BANDAGE CONFORM 2  STR LF (GAUZE/BANDAGES/DRESSINGS) IMPLANT
BANDAGE CONFORM 2X5YD N/S (GAUZE/BANDAGES/DRESSINGS) ×4 IMPLANT
BANDAGE CONFORM 3  STR LF (GAUZE/BANDAGES/DRESSINGS) IMPLANT
BANDAGE ELASTIC 6 VELCRO ST LF (GAUZE/BANDAGES/DRESSINGS) ×4 IMPLANT
BANDAGE ESMARK 6X9 LF (GAUZE/BANDAGES/DRESSINGS) ×3 IMPLANT
BANDAGE GAUZE ELAST BULKY 4 IN (GAUZE/BANDAGES/DRESSINGS) ×4 IMPLANT
BLADE 4.2CUDA (BLADE) IMPLANT
BLADE CUDA 5.5 (BLADE) IMPLANT
BLADE CUDA SHAVER 3.5 (BLADE) ×4 IMPLANT
BLADE CUTTER GATOR 3.5 (BLADE) IMPLANT
BLADE GREAT WHITE 4.2 (BLADE) IMPLANT
BLADE SURG 15 STRL LF DISP TIS (BLADE) ×3 IMPLANT
BLADE SURG 15 STRL SS (BLADE) ×1
BNDG COHESIVE 4X5 TAN NS LF (GAUZE/BANDAGES/DRESSINGS) ×4 IMPLANT
BNDG ESMARK 4X9 LF (GAUZE/BANDAGES/DRESSINGS) ×4 IMPLANT
BNDG ESMARK 6X9 LF (GAUZE/BANDAGES/DRESSINGS) ×4
CANISTER SUCT LVC 12 LTR MEDI- (MISCELLANEOUS) ×4 IMPLANT
CANISTER SUCTION 1200CC (MISCELLANEOUS) ×4 IMPLANT
CLOTH BEACON ORANGE TIMEOUT ST (SAFETY) ×4 IMPLANT
CORDS BIPOLAR (ELECTRODE) IMPLANT
COVER MAYO STAND STRL (DRAPES) ×4 IMPLANT
COVER TABLE BACK 60X90 (DRAPES) ×4 IMPLANT
DRAPE ARTHROSCOPY W/POUCH 114 (DRAPES) ×4 IMPLANT
DRAPE EXTREMITY T 121X128X90 (DRAPE) ×4 IMPLANT
DRAPE LG THREE QUARTER DISP (DRAPES) ×4 IMPLANT
DRAPE U-SHAPE 47X51 STRL (DRAPES) ×4 IMPLANT
DRSG EMULSION OIL 3X3 NADH (GAUZE/BANDAGES/DRESSINGS) ×4 IMPLANT
DRSG PAD ABDOMINAL 8X10 ST (GAUZE/BANDAGES/DRESSINGS) ×8 IMPLANT
DURAPREP 26ML APPLICATOR (WOUND CARE) ×8 IMPLANT
ELECT MENISCUS 165MM 90D (ELECTRODE) IMPLANT
ELECT NEEDLE TIP 2.8 STRL (NEEDLE) IMPLANT
ELECT REM PT RETURN 9FT ADLT (ELECTROSURGICAL) ×4
ELECTRODE REM PT RTRN 9FT ADLT (ELECTROSURGICAL) ×3 IMPLANT
GAUZE SPONGE 4X4 12PLY STRL LF (GAUZE/BANDAGES/DRESSINGS) IMPLANT
GLOVE ECLIPSE 6.5 STRL STRAW (GLOVE) ×8 IMPLANT
GLOVE ECLIPSE 8.0 STRL XLNG CF (GLOVE) ×8 IMPLANT
GLOVE INDICATOR 6.5 STRL GRN (GLOVE) ×8 IMPLANT
GLOVE INDICATOR 8.0 STRL GRN (GLOVE) ×12 IMPLANT
GOWN PREVENTION PLUS LG XLONG (DISPOSABLE) ×8 IMPLANT
GOWN STRL REIN XL XLG (GOWN DISPOSABLE) ×4 IMPLANT
GOWN W/COTTON TOWEL STD LRG (GOWNS) ×4 IMPLANT
GOWN XL W/COTTON TOWEL STD (GOWNS) ×4 IMPLANT
IMMBOLIZER KNEE 19 BLUE UNIV (SOFTGOODS) ×4 IMPLANT
IV NS IRRIG 3000ML ARTHROMATIC (IV SOLUTION) ×8 IMPLANT
KNEE SPLINT 20' ×4 IMPLANT
KNEE WRAP E Z 3 GEL PACK (MISCELLANEOUS) ×4 IMPLANT
NDL SAFETY ECLIPSE 18X1.5 (NEEDLE) ×3 IMPLANT
NEEDLE 27GAX1X1/2 (NEEDLE) ×4 IMPLANT
NEEDLE HYPO 18GX1.5 BLUNT FILL (NEEDLE) ×4 IMPLANT
NEEDLE HYPO 18GX1.5 SHARP (NEEDLE) ×1
NEEDLE HYPO 22GX1.5 SAFETY (NEEDLE) ×4 IMPLANT
NS IRRIG 500ML POUR BTL (IV SOLUTION) ×4 IMPLANT
PACK ARTHROSCOPY DSU (CUSTOM PROCEDURE TRAY) ×4 IMPLANT
PACK BASIN DAY SURGERY FS (CUSTOM PROCEDURE TRAY) ×4 IMPLANT
PADDING CAST ABS 4INX4YD NS (CAST SUPPLIES) ×1
PADDING CAST ABS COTTON 4X4 ST (CAST SUPPLIES) ×3 IMPLANT
PENCIL BUTTON HOLSTER BLD 10FT (ELECTRODE) ×4 IMPLANT
SET ARTHROSCOPY TUBING (MISCELLANEOUS) ×1
SET ARTHROSCOPY TUBING LN (MISCELLANEOUS) ×3 IMPLANT
SPONGE GAUZE 2X2 8PLY STRL LF (GAUZE/BANDAGES/DRESSINGS) IMPLANT
SPONGE GAUZE 4X4 12PLY (GAUZE/BANDAGES/DRESSINGS) ×4 IMPLANT
STAPLER VISISTAT (STAPLE) ×4 IMPLANT
STOCKINETTE 4X48 STRL (DRAPES) ×4 IMPLANT
STOCKINETTE IMPERVIOUS LG (DRAPES) ×4 IMPLANT
SUT ETHIBOND 2 OS 4 DA (SUTURE) IMPLANT
SUT ETHILON 4 0 PS 2 18 (SUTURE) ×4 IMPLANT
SUT VIC AB 0 CT1 36 (SUTURE) IMPLANT
SUT VIC AB 1 CT1 36 (SUTURE) ×4 IMPLANT
SUT VIC AB 2-0 CT1 27 (SUTURE) ×2
SUT VIC AB 2-0 CT1 TAPERPNT 27 (SUTURE) ×6 IMPLANT
SUT VIC AB 2-0 PS2 27 (SUTURE) IMPLANT
SUT VIC AB 2-0 SH 27 (SUTURE)
SUT VIC AB 2-0 SH 27XBRD (SUTURE) IMPLANT
SUT VIC AB 2-0 UR6 27 (SUTURE) IMPLANT
SUT VIC AB 3-0 FS2 27 (SUTURE) IMPLANT
SUT VIC AB 4-0 SH 27 (SUTURE)
SUT VIC AB 4-0 SH 27XANBCTRL (SUTURE) IMPLANT
SYR BULB IRRIGATION 50ML (SYRINGE) ×4 IMPLANT
SYR CONTROL 10ML LL (SYRINGE) ×8 IMPLANT
SYRINGE 10CC LL (SYRINGE) ×4 IMPLANT
TOWEL NATURAL 6PK STERILE (DISPOSABLE) ×4 IMPLANT
TOWEL OR 17X24 6PK STRL BLUE (TOWEL DISPOSABLE) ×8 IMPLANT
UNDERPAD 30X30 INCONTINENT (UNDERPADS AND DIAPERS) ×4 IMPLANT
WAND 90 DEG TURBOVAC W/CORD (SURGICAL WAND) IMPLANT
WATER STERILE IRR 500ML POUR (IV SOLUTION) ×4 IMPLANT

## 2011-08-03 NOTE — Transfer of Care (Signed)
Immediate Anesthesia Transfer of Care Note  Patient: Amy Williamson  Procedure(s) Performed:  CYST REMOVAL - EXCISION OF POPITEAL CYST ; KNEE ARTHROSCOPY WITH MEDIAL MENISECTOMY -  partial medial menisectomy, shaving of medial femeral chondrial , partial lateral menisectomy and shaving lateral femeral chondrial  Patient Location: PACU  Anesthesia Type: General  Level of Consciousness: awake and alert   Airway & Oxygen Therapy: Patient Spontanous Breathing and Patient connected to nasal cannula oxygen  Post-op Assessment: Report given to PACU RN  Post vital signs: Reviewed and stable Filed Vitals:   08/03/11 1415  BP: 94/46  Pulse: 60  Temp: 35.7 C  Resp: 14    Complications: No apparent anesthesia complications

## 2011-08-03 NOTE — Anesthesia Preprocedure Evaluation (Addendum)
Anesthesia Evaluation  Patient identified by MRN, date of birth, ID band Patient awake    Reviewed: Allergy & Precautions, H&P , NPO status , Patient's Chart, lab work & pertinent test results  Airway Mallampati: III TM Distance: <3 FB Neck ROM: Full    Dental No notable dental hx.    Pulmonary neg pulmonary ROS,  clear to auscultation  Pulmonary exam normal       Cardiovascular hypertension, Regular Normal    Neuro/Psych Negative Neurological ROS  Negative Psych ROS   GI/Hepatic Neg liver ROS, GERD-  ,  Endo/Other  Negative Endocrine ROSMorbid obesity  Renal/GU negative Renal ROS  Genitourinary negative   Musculoskeletal negative musculoskeletal ROS (+)   Abdominal   Peds negative pediatric ROS (+)  Hematology negative hematology ROS (+)   Anesthesia Other Findings   Reproductive/Obstetrics negative OB ROS                          Anesthesia Physical Anesthesia Plan  ASA: II  Anesthesia Plan: General   Post-op Pain Management:    Induction: Intravenous  Airway Management Planned: LMA  Additional Equipment:   Intra-op Plan:   Post-operative Plan:   Informed Consent: I have reviewed the patients History and Physical, chart, labs and discussed the procedure including the risks, benefits and alternatives for the proposed anesthesia with the patient or authorized representative who has indicated his/her understanding and acceptance.   Dental advisory given  Plan Discussed with: CRNA  Anesthesia Plan Comments:         Anesthesia Quick Evaluation

## 2011-08-03 NOTE — Anesthesia Postprocedure Evaluation (Signed)
Anesthesia Post Note  Patient: Amy Williamson  Procedure(s) Performed:  CYST REMOVAL - EXCISION OF POPITEAL CYST ; KNEE ARTHROSCOPY WITH MEDIAL MENISECTOMY -  partial medial menisectomy, shaving of medial femeral chondrial , partial lateral menisectomy and shaving lateral femeral chondrial  Anesthesia type: General  Patient location: PACU  Post pain: Pain level controlled  Post assessment: Post-op Vital signs reviewed  Last Vitals:  Filed Vitals:   08/03/11 1503  BP:   Pulse:   Temp: 34.7 C  Resp:     Post vital signs: Reviewed  Level of consciousness: sedated  Complications: No apparent anesthesia complications

## 2011-08-03 NOTE — Brief Op Note (Signed)
08/03/2011  2:32 PM  PATIENT:  Amy Williamson  61 y.o. female  PRE-OPERATIVE DIAGNOSIS:  TORN MEDIAL AND LATERAL MENISCAL TEAR BAKERS CYST  Lt kneeand popliteal  POST-OPERATIVE DIAGNOSIS:  TORN MEDIAL AND LATERAL  MENISCAL TEARand popliteal cyst lt knee  PROCEDURE:  Procedure(s): CYST REMOVAL KNEE ARTHROSCOPY WITH MEDIAL MENISECTOMYand lateral meniscectomy  SURGEON:  Surgeon(s): Leannah Guse P Travin Marik  PHYSICIAN ASSISTANT:   ASSISTANTS: , nurse  ANESTHESIA:   local and general  EBL:  Total I/O In: 900 [I.V.:900] Out: -   BLOOD ADMINISTERED:none  DRAINS: none   LOCAL MEDICATIONS USED:  MARCAINE 7CC  SPECIMEN:  Source of Specimen:  popliteal cyst  DISPOSITION OF SPECIMEN:  PATHOLOGY  COUNTS:  YES  TOURNIQUET:   Total Tourniquet Time Documented: Thigh (Left) - 81 minutes  DICTATION: .Other Dictation: Dictation Number [id number]  PLAN OF CARE: Admit for overnight observation  PATIENT DISPOSITION:  PACU - hemodynamically stable.

## 2011-08-03 NOTE — H&P (Signed)
Amy Williamson is an 61 y.o. female.   Chief Complainpainful lt knee HPI:mti demonstrates tears both menisci and large popliteal   cyst  Past Medical History  Diagnosis Date  . Unspecified essential hypertension   . Palpitations   . Unspecified venous (peripheral) insufficiency   . Pure hypercholesterolemia   . Obesity, unspecified   . Myalgia and myositis, unspecified   . Depressive disorder, not elsewhere classified   . History of pneumonia 05-14-11  . Bronchitis 07-14-11  . History of esophageal reflux   . History of migraine headaches   . Arthritis     BACK AND SHOULDERS  . Cataract immature BILATERAL    Past Surgical History  Procedure Date  . Pulley release right trigger finger 01-08-2011  . Carpal tunnel release 2004    RIGHT  . Carpal tunnel release 2006    LEFT    Family History  Problem Relation Age of Onset  . Heart failure Mother   . Cancer Brother   . Heart disease Father   . Clotting disorder Mother   . Clotting disorder Father    Social History:  reports that she has never smoked. She has never used smokeless tobacco. She reports that she does not drink alcohol or use illicit drugs.  Allergies: No Known Allergies  Medications Prior to Admission  Medication Dose Route Frequency Provider Last Rate Last Dose  . 1 mL EPINEPHrine 1 mg/mL (1:1000) in 0.9% Normal Saline 3000 mL irrigation    PRN James P Aplington      . 1 mL EPINEPHrine 1 mg/mL (1:1000) in 0.9% Normal Saline 3000 mL irrigation    PRN James P Aplington      . 4 mg morphine in Marcaine 0.5% w/EPINEPHrine 1:200,000 (20 mL) injection    PRN James P Aplington      . lactated ringers infusion   Intravenous Continuous Phillips Grout, MD 100 mL/hr at 08/03/11 1115 100 mL/hr at 08/03/11 1115  . povidone-iodine (BETADINE) 7.5 % scrub   Topical Once James P Aplington      . sodium chloride irrigation 0.9 %    PRN James P Aplington   500 mL at 08/03/11 1142  . sterile water for irrigation    PRN Illene Labrador Aplington   500 mL at 08/03/11 1142   Medications Prior to Admission  Medication Sig Dispense Refill  . albuterol (PROVENTIL) (2.5 MG/3ML) 0.083% nebulizer solution Take by nebulization as needed. 1 vial 2-3 times a day      . Ascorbic Acid (VITAMIN C) 1000 MG tablet Take 1,000 mg by mouth daily.       [name] aspirin 81 MG tablet Take 81 mg by mouth daily.       [name] atenolol (TENORMIN) 50 MG tablet Take 50 mg by mouth every morning.      . Calcium Citrate-Vitamin D (CITRACAL + D PO) Take 1 tablet by mouth daily.      . cholecalciferol (VITAMIN D) 1000 UNITS tablet Take 1,000 Units by mouth daily.      . Multiple Vitamin (MULTIVITAMIN) tablet Take 1 tablet by mouth daily.       . simvastatin (ZOCOR) 40 MG tablet Take 1 tablet (40 mg total) by mouth at bedtime.  31 tablet  11  . ALPRAZolam (XANAX) 0.5 MG tablet 0.5 mg as needed. Take 1/2 to 1 tablet three times a day as needed.        Results for orders placed during the hospital encounter  of 08/03/11 (from the past 48 hour(s))  POCT I-STAT 4, (NA,K, GLUC, HGB,HCT)     Status: Normal   Collection Time   08/03/11 11:20 AM      Component Value Range Comment   Sodium 144  135 - 145 (mEq/L)    Potassium 4.0  3.5 - 5.1 (mEq/L)    Glucose, Bld 85  70 - 99 (mg/dL)    HCT 81.1  91.4 - 78.2 (%)    Hemoglobin 13.3  12.0 - 15.0 (g/dL)    No results found.  ROS  Blood pressure 158/69, pulse 55, temperature 97.6 F (36.4 C), temperature source Oral, resp. rate 16, height 5\' 1"  (1.549 m), weight 88.451 kg (195 lb), SpO2 100.00%. Physical Exam  Vitals reviewed. Constitutional: She is oriented to person, place, and time. She appears well-developed and well-nourished.  HENT:  Head: Normocephalic and atraumatic.  Right Ear: External ear normal.  Left Ear: External ear normal.  Nose: Nose normal.  Mouth/Throat: Oropharynx is clear and moist.  Eyes: Conjunctivae and EOM are normal. Pupils are equal, round, and reactive to light.  Neck: Normal range of  motion. Neck supple.  Cardiovascular: Normal rate, regular rhythm, normal heart sounds and intact distal pulses.   Respiratory: Effort normal and breath sounds normal.  GI: Soft. Bowel sounds are normal.  Musculoskeletal: Normal range of motion. She exhibits tenderness.       Tender both joint lines;popliteal cyst,Lt knee  Neurological: She is alert and oriented to person, place, and time. She has normal reflexes.  Skin: Skin is warm and dry.  Psychiatric: She has a normal mood and affect. Her behavior is normal. Judgment and thought content normal.     Assessment/Plan Torn medial and lateral menisci and large popliteal cyst lt knee Lt knee arthroscopy with partial medial and lateral meniscectomy;excision popliteal cyst  APLINGTON,JAMES P 08/03/2011, 12:04 PM

## 2011-08-03 NOTE — Anesthesia Procedure Notes (Addendum)
Performed by: Maris Berger Pre-anesthesia Checklist: Patient identified, Emergency Drugs available, Suction available and Patient being monitored Patient Re-evaluated:Patient Re-evaluated prior to inductionOxygen Delivery Method: Circle System Utilized Preoxygenation: Pre-oxygenation with 100% oxygen Intubation Type: IV induction Ventilation: Mask ventilation without difficulty LMA: LMA inserted LMA Size: 4.0 Number of attempts: 1 Placement Confirmation: positive ETCO2 Tube secured with: Tape Dental Injury: Teeth and Oropharynx as per pre-operative assessment     Procedure Name: Intubation Date/Time: 08/03/2011 12:27 PM Performed by: Maris Berger Pre-anesthesia Checklist: Patient identified, Emergency Drugs available, Suction available and Patient being monitored Oxygen Delivery Method: Circle System Utilized Laryngoscope Size: Mac and 3 Grade View: Grade I Tube type: Oral Tube size: 7.0 mm Airway Equipment and Method: stylet Placement Confirmation: ETT inserted through vocal cords under direct vision and positive ETCO2 Secured at: 21 cm Tube secured with: Tape Dental Injury: Teeth and Oropharynx as per pre-operative assessment  Comments: Sux given, LMA removed, blood noted on LMA upon removal.  Intubated without dificulty.    Performed by: Maris Berger

## 2011-08-06 ENCOUNTER — Encounter (HOSPITAL_BASED_OUTPATIENT_CLINIC_OR_DEPARTMENT_OTHER): Payer: Self-pay | Admitting: Orthopedic Surgery

## 2011-08-06 NOTE — Op Note (Signed)
Amy Williamson, Amy Williamson NO.:  [id number]  MEDICAL RECORD NO.:  [id number]  LOCATION:                               FACILITY:  [facility]  PHYSICIAN:  Marlowe Kays, M.D.  DATE OF BIRTH:  [birth year]  DATE OF PROCEDURE:  08/03/2011 DATE OF DISCHARGE:                              OPERATIVE REPORT   PREOPERATIVE DIAGNOSES: 1. Torn medial and lateral menisci. 2. Large popliteal cyst, left knee.  POSTOPERATIVE DIAGNOSES: 1. Torn medial and lateral menisci. 2. Large popliteal cyst, left knee.  OPERATION: 1. Left knee arthroscopy with     a.     Partial medial and lateral meniscectomies.     b.     Shaving of medial and lateral femoral condyles. 2. Excision of large popliteal cyst, left knee.  SURGEON:  Marlowe Kays, M.D.  ASSISTANT:  Nurse.  ANESTHESIA:  General.  PATHOLOGY AND JUSTIFICATION FOR PROCEDURE:  MRI demonstrated the torn menisci.  Normally, I would not take out the popliteal cyst because I explained to her the popliteal cyst was so large and measuring 7.9 cm on the MRI and so aggravating to her that I felt the proper course was to do an open excision of the cyst.  PROCEDURE IN DETAIL:  Satisfied general anesthesia, we started out preparing for the arthroscopy first with Ace wrap and knee support for right lower extremity, pneumatic tourniquet applied to left lower extremity with the leg Esmarched out non-sterilely and tourniquet inflated to 350 mmHg.  Thigh stabilizer applied.  Left leg was then prepped with DuraPrep from stabilizer to ankle and draped in a sterile field.  Time-out was performed for both procedures.  Superior medial saline inflow.  First through an anterolateral portal, medial compartment of the knee was evaluated.  She had a good bit of fibrotic synovitis anteriorly and some wear of the medial femoral condyle.  I resected the fibrotic synovium and shaved down the medial femoral condyle slightly.  Posteriorly, she had a tear  parallel to the tibial plateau of the superior portion of the medial meniscus.  Then posteriorly, she had a typical degenerative-type tear.  I was able to eradicate both these with the 3.5 shaver with final pictures being taken.  Looking at the medial gutter and suprapatellar area, she had some minor wear of the patella, which I shaved.  I then reversed portals laterally.  She had good bit of synovitis present and fraying of most of the inner portion of the lateral meniscus, which I shaved down until smooth with a 3.5 shaver.  I also smoothed down the lateral femoral condyle slightly with the shaver as well.  Knee joint was then irrigated to clear and I closed all 3 portals with 4-0 nylon and also injected 15 cc of indigo carmine into the knee joint.  We then released the tourniquet and protecting the anterior knee with a sterile towel, moved her onto a gurney.  We then placed the gel rolls on the OR table and transferred her back to the OR table in the prone position.  I then once again Esmarched out the knee and we inflated the tourniquet to 350 mmHg. Her left leg was then prepped  from tourniquet to ankle with DuraPrep draped in a sterile field.  The popliteal cyst was palpable.  I made a lazy S-type incision superiorly and somewhat inferiorly to the popliteal space biased medially.  I went through the subcutaneous tissue.  The cyst was readily apparent.  It was large as depicted on the MRI.  I then carefully dissected it out from the surrounding tissues. It ruptured during this process, which actually facilitated my excising it.  The specimen was sent to pathology.  I then tried to close the extra space from the knee with multiple interrupted #1 Vicryl sutures mainly between the adjacent hamstring tendon and the medial head of the gastrocnemius. This seemed to obliterate all dead space.  The subcutaneous tissue was then closed deep with #1 Vicryl superficially with 2-0 Vicryl staples  in the skin.  Betadine, Adaptic, dry sterile dressing were applied. Tourniquet was released.  She was then gently placed back on her back on the operating room gurney and was going to be placed in a knee immobilizer.  She tolerated the procedure well with no operative complications and minimal blood loss.          ______________________________ Marlowe Kays, M.D.     JA/MEDQ  D:  08/03/2011  T:  08/05/2011  Job:  [id number]

## 2011-10-08 ENCOUNTER — Other Ambulatory Visit: Payer: Self-pay | Admitting: Obstetrics & Gynecology

## 2011-10-08 DIAGNOSIS — Z1231 Encounter for screening mammogram for malignant neoplasm of breast: Secondary | ICD-10-CM

## 2011-11-01 ENCOUNTER — Ambulatory Visit
Admission: RE | Admit: 2011-11-01 | Discharge: 2011-11-01 | Disposition: A | Discharge status: Home / Self Care | Payer: 59 | Source: Ambulatory Visit | Attending: Obstetrics & Gynecology | Admitting: Obstetrics & Gynecology

## 2011-11-01 DIAGNOSIS — Z1231 Encounter for screening mammogram for malignant neoplasm of breast: Secondary | ICD-10-CM

## 2012-02-20 ENCOUNTER — Encounter: Payer: Self-pay | Admitting: Internal Medicine

## 2012-02-25 ENCOUNTER — Telehealth: Payer: Self-pay | Admitting: Internal Medicine

## 2012-02-25 NOTE — Telephone Encounter (Signed)
Patient calling to report in July she had blood in stool x 2 1/2 weeks. States she would have bright, red blood on tissue and in water with bowel movements. States it has stopped now. She is due for colonoscopy this year also.(last one- 04/20/02 normal) Scheduled patient to see Willette Cluster, NP on 02/27/12 at 9:30 AM.

## 2012-02-27 ENCOUNTER — Encounter: Payer: Self-pay | Admitting: Internal Medicine

## 2012-02-27 ENCOUNTER — Ambulatory Visit (INDEPENDENT_AMBULATORY_CARE_PROVIDER_SITE_OTHER): Payer: 59 | Admitting: Nurse Practitioner

## 2012-02-27 ENCOUNTER — Encounter: Payer: Self-pay | Admitting: Nurse Practitioner

## 2012-02-27 VITALS — BP 138/74 | HR 60 | Ht 60.6 in | Wt 204.6 lb

## 2012-02-27 DIAGNOSIS — K625 Hemorrhage of anus and rectum: Secondary | ICD-10-CM

## 2012-02-27 DIAGNOSIS — K219 Gastro-esophageal reflux disease without esophagitis: Secondary | ICD-10-CM | POA: Insufficient documentation

## 2012-02-27 DIAGNOSIS — Z1211 Encounter for screening for malignant neoplasm of colon: Secondary | ICD-10-CM | POA: Insufficient documentation

## 2012-02-27 MED ORDER — MOVIPREP 100 G PO SOLR: 1.0000 | Freq: Once | ORAL | Status: AC

## 2012-02-27 MED ORDER — HYDROCORTISONE ACETATE 25 MG RE SUPP: 25.0000 mg | Freq: Every day | RECTAL | Status: AC

## 2012-02-27 NOTE — Patient Instructions (Addendum)
We have sent prescriptions to [facility] [name] for Anusol Supp and the colonoscopy prep, MOVIPREP.  We have given you  Reflux literature. We scheduled the Colonoscopy with Dr Lina Sar on 03-12-2012.   Colonoscopy A colonoscopy is an exam to evaluate your entire colon. In this exam, your colon is cleansed. A long fiberoptic tube is inserted through your rectum and into your colon. The fiberoptic scope (endoscope) is a long bundle of enclosed and very flexible fibers. These fibers transmit light to the area examined and send images from that area to your caregiver. Discomfort is usually minimal. You may be given a drug to help you sleep (sedative) during or prior to the procedure. This exam helps to detect lumps (tumors), polyps, inflammation, and areas of bleeding. Your caregiver may also take a small piece of tissue (biopsy) that will be examined under a microscope. LET YOUR CAREGIVER KNOW ABOUT:   Allergies to food or medicine.   Medicines taken, including vitamins, herbs, eyedrops, over-the-counter medicines, and creams.   Use of steroids (by mouth or creams).   Previous problems with anesthetics or numbing medicines.   History of bleeding problems or blood clots.   Previous surgery.   Other health problems, including diabetes and kidney problems.   Possibility of pregnancy, if this applies.  BEFORE THE PROCEDURE   A clear liquid diet may be required for 2 days before the exam.   Ask your caregiver about changing or stopping your regular medications.   Liquid injections (enemas) or laxatives may be required.   A large amount of electrolyte solution may be given to you to drink over a short period of time. This solution is used to clean out your colon.   You should be present 60 minutes prior to your procedure or as directed by your caregiver.  AFTER THE PROCEDURE   If you received a sedative or pain relieving medication, you will need to arrange for someone to drive you home.     Occasionally, there is a little blood passed with the first bowel movement. Do not be concerned.  FINDING OUT THE RESULTS OF YOUR TEST Not all test results are available during your visit. If your test results are not back during the visit, make an appointment with your caregiver to find out the results. Do not assume everything is normal if you have not heard from your caregiver or the medical facility. It is important for you to follow up on all of your test results. HOME CARE INSTRUCTIONS   It is not unusual to pass moderate amounts of gas and experience mild abdominal cramping following the procedure. This is due to air being used to inflate your colon during the exam. Walking or a warm pack on your belly (abdomen) may help.   You may resume all normal meals and activities after sedatives and medicines have worn off.   Only take over-the-counter or prescription medicines for pain, discomfort, or fever as directed by your caregiver. Do not use aspirin or blood thinners if a biopsy was taken. Consult your caregiver for medicine usage if biopsies were taken.  SEEK IMMEDIATE MEDICAL CARE IF:   You have a fever.   You pass large blood clots or fill a toilet with blood following the procedure. This may also occur 10 to 14 days following the procedure. This is more likely if a biopsy was taken.   You develop abdominal pain that keeps getting worse and cannot be relieved with medicine.  Document Released:  07/06/2000 Document Revised: 06/28/2011 Document Reviewed: 02/19/2008 [facility] Patient Information 2012 [place], [place].

## 2012-02-27 NOTE — Progress Notes (Signed)
02/27/2012 Amy Williamson [id number] Sep 07, 1950   HISTORY OF PRESENT ILLNESS: Patient is a 61 year old female known remotely to Dr. Juanda Chance who did her screening colonoscopy September 2003. Colonoscopy was normal, no polyps found. Patient is here today for evaluation of rectal bleeding. A couple of weeks ago she had several episodes of bright red blood with defecation. She hasn't had any bleeding in the last week and a half. She denies constipation or altered bowel changes. She is due for screening colonoscopy. Patient also gives a history of GERD. She has not required any medication for GERD in years until recently. Now having occasional GERD symptoms   Past Medical History  Diagnosis Date  . Unspecified essential hypertension       . Unspecified venous (peripheral) insufficiency   . Pure hypercholesterolemia   . Obesity, unspecified   . Myalgia and myositis, unspecified   . Depressive disorder, not elsewhere classified   . History of pneumonia 05-14-11  . Bronchitis 07-14-11  . History of esophageal reflux   . History of migraine headaches   . Arthritis     BACK AND SHOULDERS  . Cataract immature BILATERAL   Past Surgical History  Procedure Date  . Pulley release right trigger finger 01-08-2011  . Carpal tunnel release 2004    RIGHT  . Carpal tunnel release 2006    LEFT  . Ear cyst excision 08/03/2011    Procedure: CYST REMOVAL;  Surgeon: Illene Labrador Aplington;  Location: [facility];  Service: Orthopedics;  Laterality: N/A;  EXCISION OF POPITEAL CYST   . Knee surgery     lt  . Leg cyst removal     reports that she has never smoked. She has never used smokeless tobacco. She reports that she does not drink alcohol or use illicit drugs. family history includes Clotting disorder in her father and mother; Heart disease in her father; Heart failure in her mother; Liver cancer in her brother; and Liver disease in her maternal aunt. Allergies  Allergen Reactions  . Avelox  (Moxifloxacin Hcl In Nacl)     Pt did not like side effects      Outpatient Encounter Prescriptions as of 02/27/2012  Medication Sig Dispense Refill  . albuterol (PROVENTIL) (2.5 MG/3ML) 0.083% nebulizer solution Take by nebulization as needed. 1 vial 2-3 times a day      . ALPRAZolam (XANAX) 0.5 MG tablet 0.5 mg as needed. Take 1/2 to 1 tablet three times a day as needed.      . Ascorbic Acid (VITAMIN C) 1000 MG tablet Take 1,000 mg by mouth daily.       [name] aspirin 81 MG tablet Take 81 mg by mouth daily.       [name] atenolol (TENORMIN) 50 MG tablet Take 50 mg by mouth every morning.      . Calcium Citrate-Vitamin D (CITRACAL + D PO) Take 1 tablet by mouth daily.      . cholecalciferol (VITAMIN D) 1000 UNITS tablet Take 1,000 Units by mouth daily.      [name] ibuprofen (ADVIL,MOTRIN) 200 MG tablet Take 200 mg by mouth every 6 (six) hours as needed.      . Multiple Vitamin (MULTIVITAMIN) tablet Take 1 tablet by mouth daily.       . Multiple Vitamins-Minerals (ANTIOXIDANT FORMULA) CAPS Take 1 capsule by mouth daily.      . simvastatin (ZOCOR) 40 MG tablet Take 1 tablet (40 mg total) by mouth at bedtime.  31 tablet  11  .  zinc gluconate 50 MG tablet Take 50 mg by mouth daily.         REVIEW OF SYSTEMS  : Positive for arthritis, back pain and itching. All other systems reviewed and negative except where noted in the History of Present Illness.   PHYSICAL EXAM: BP 138/74  Pulse 60  Ht 5' 0.6" (1.539 m)  Wt 204 lb 9.6 oz (92.806 kg)  BMI 39.17 kg/m2 General: Well developed white female in no acute distress Head: Normocephalic and atraumatic Eyes:  sclerae anicteric,conjunctive pink. Ears: Normal auditory acuity Neck: Supple, no masses.  Lungs: Clear throughout to auscultation Heart: Regular rate and rhythm; soft murmur heard Abdomen: Soft, non distended, nontender. No masses or hepatomegaly noted. Normal Bowel sounds Rectal: Small external hemorrhoids, large internal hemorrhoids on  anoscopy. Musculoskeletal: Symmetrical with no gross deformities  Skin: No lesions on visible extremities Extremities: No edema or deformities noted Neurological: Alert oriented x 4, grossly nonfocal Cervical Nodes:  No significant cervical adenopathy Psychological:  Alert and cooperative. Normal mood and affect  ASSESSMENT AND PLAN:  91. 61 year old female here for evaluation of self limited rectal bleeding which occurred a couple weeks ago. No rectal bleeding in the last 1.5 weeks. Bleeding is likely secondary to hemorrhoids. Other evaluation at time of colonoscopy, see #2.  2. colon cancer screening. Patient is due for surveillance colonoscopy which we will arrange.The risks, benefits, and alternatives to colonoscopy with possible biopsy and possible polypectomy were discussed with the patient and they consent to proceed.   3. hemorrhoid, external and internal. Will treat with Anusol-HC suppositories.  4. GERD, has not required a PPI in years. She occasional GERD symptoms. If having GERD symptoms more than 2-3 times a week , a PPI should be considered.

## 2012-02-28 ENCOUNTER — Encounter: Payer: Self-pay | Admitting: Nurse Practitioner

## 2012-02-29 NOTE — Progress Notes (Signed)
Reviewed and agree with management. Kwanza Cancelliere D. Kashawn Manzano, M.D., FACG  

## 2012-03-03 ENCOUNTER — Ambulatory Visit (INDEPENDENT_AMBULATORY_CARE_PROVIDER_SITE_OTHER): Payer: 59 | Admitting: Pulmonary Disease

## 2012-03-03 ENCOUNTER — Other Ambulatory Visit (INDEPENDENT_AMBULATORY_CARE_PROVIDER_SITE_OTHER): Payer: 59

## 2012-03-03 ENCOUNTER — Encounter: Payer: Self-pay | Admitting: Pulmonary Disease

## 2012-03-03 VITALS — BP 128/82 | HR 50 | Temp 97.2°F | Ht 60.5 in | Wt 203.0 lb

## 2012-03-03 DIAGNOSIS — F419 Anxiety disorder, unspecified: Secondary | ICD-10-CM | POA: Insufficient documentation

## 2012-03-03 DIAGNOSIS — E669 Obesity, unspecified: Secondary | ICD-10-CM

## 2012-03-03 DIAGNOSIS — F341 Dysthymic disorder: Secondary | ICD-10-CM

## 2012-03-03 DIAGNOSIS — K625 Hemorrhage of anus and rectum: Secondary | ICD-10-CM

## 2012-03-03 DIAGNOSIS — I872 Venous insufficiency (chronic) (peripheral): Secondary | ICD-10-CM

## 2012-03-03 DIAGNOSIS — I10 Essential (primary) hypertension: Secondary | ICD-10-CM

## 2012-03-03 DIAGNOSIS — E7800 Pure hypercholesterolemia, unspecified: Secondary | ICD-10-CM

## 2012-03-03 DIAGNOSIS — IMO0001 Reserved for inherently not codable concepts without codable children: Secondary | ICD-10-CM

## 2012-03-03 DIAGNOSIS — E559 Vitamin D deficiency, unspecified: Secondary | ICD-10-CM

## 2012-03-03 DIAGNOSIS — K219 Gastro-esophageal reflux disease without esophagitis: Secondary | ICD-10-CM

## 2012-03-03 DIAGNOSIS — F329 Major depressive disorder, single episode, unspecified: Secondary | ICD-10-CM

## 2012-03-03 LAB — LIPID PANEL
Cholesterol: 160 mg/dL (ref 0–200)
HDL: 57.2 mg/dL (ref >39.00)
Total CHOL/HDL Ratio: 3
Triglycerides: 127 mg/dL (ref 0.0–149.0)

## 2012-03-03 LAB — BASIC METABOLIC PANEL
CO2: 31 mEq/L (ref 19–32)
Calcium: 9.6 mg/dL (ref 8.4–10.5)
Chloride: 102 mEq/L (ref 96–112)
Creatinine, Ser: 0.7 mg/dL (ref 0.4–1.2)
Glucose, Bld: 94 mg/dL (ref 70–99)

## 2012-03-03 LAB — CBC WITH DIFFERENTIAL/PLATELET
Basophils Absolute: 0 10*3/uL (ref 0.0–0.1)
Basophils Relative: 0.4 % (ref 0.0–3.0)
Eosinophils Absolute: 0.3 10*3/uL (ref 0.0–0.7)
Lymphocytes Relative: 22 % (ref 12.0–46.0)
MCHC: 33.5 g/dL (ref 30.0–36.0)
Monocytes Relative: 5.9 % (ref 3.0–12.0)
Neutrophils Relative %: 68.7 % (ref 43.0–77.0)
RBC: 4.63 Mil/uL (ref 3.87–5.11)
RDW: 13.8 % (ref 11.5–14.6)

## 2012-03-03 LAB — HEPATIC FUNCTION PANEL
ALT: 18 U/L (ref 0–35)
Albumin: 4.2 g/dL (ref 3.5–5.2)
Bilirubin, Direct: 0.1 mg/dL (ref 0.0–0.3)
Total Protein: 7.2 g/dL (ref 6.0–8.3)

## 2012-03-03 MED ORDER — SIMVASTATIN 40 MG PO TABS: 40.0000 mg | ORAL_TABLET | Freq: Every day | ORAL | Status: DC

## 2012-03-03 MED ORDER — ATENOLOL 50 MG PO TABS: 50.0000 mg | ORAL_TABLET | ORAL | Status: DC

## 2012-03-03 MED ORDER — ALPRAZOLAM 0.5 MG PO TABS: 0.5000 mg | ORAL_TABLET | ORAL | Status: DC | PRN

## 2012-03-03 NOTE — Progress Notes (Signed)
Subjective:    Patient ID: Amy Williamson, female    DOB: 1950-11-16, 61 y.o.   MRN: [id number]  HPI 61 y/o WF here for a yearly follow up visit...   ~  March 03, 2010:  she has had a good year medically but continues to be under mod stress having to care for her husb w/ end-stage MS (she gets 10-14 H per week help from [place])... recently she has had some plantar fasciitis & bone spur dx by DrZigler, Podiatry> given shot & brace...   ~  March 05, 2011:  Yearly CPX & [name] is holding up well, under lots of stress caring for husb Amy Williamson w/ MS;  BP remains controlled & she denies CP, palpit, dizzy, syncope, edema, etc;  Chol continues to look good on Simva40 + her diet & nice 11# wt loss this yr;  Mild reflux symptoms controlled on her Prilosec;  FM is still an issue but she copes very well...    Mammogram & Ultrasound 4/12 showed benign left breast cyst...    [name] did right 4th trigger finger surg 6/12> improved...  ~  May 29, 2011:  62mo ROV & add-on to follow up recent pneumonia> she went to [facility] 10/21 w/ cough, yellow sput w/ streaky hemoptysis, low grade fever, congestion , SOB, some wheezing noted; CXR reported to show infiltrate on right & she was givenRocephin IM & Avelox po, plus Hycodan cough syrup; she improved slowly on this regimen, had some sore ribs & lingering symptoms; she saw TP 10/26 & was sl improved, asked to continue meds & f/u w/ me today> sl dry cough persists and min clear sputum, still sl weak & recouperating slowly; CXR today is clear & ident to her CPX film 03/05/11... NOTE: pt had Pneumovax 2006... She also tells me that Amy Williamson will be attended by the group: [facility]...  ~  March 03, 2012:  23mo ROV & Amy Williamson has been doing ok since her husb, Amy Williamson, passed away...    Hx Pneumonia/ LLL granuloma> prev pneumonia from 2012 & known LLL granuloma; she has NEB w/ Albut for prn use, no recent breathing problems; CXR last 11/12  was clear baseline film...    HBP> on Aten50;  BP= 128/82 & she denies HA, CP, palpit, SOB, edema...    VenInsuffic> on low sodium, elevation, support hose as needed...    Chol> on Simva40;  FLP showed TChol 160, TG 127, HDL 57, LDL 77    Obesity> weight remains ~200# despite her best effort w/ diet, exercise, etc...    GI> GERD, Hems> on Prilosec OTC as needed & recent hem prob treated w/ AnusolHC cream...    DJD/ FM/ LBP> uses rest, heat, Advil, etc...    Anxiety/ Depression> on Xanax 0.5mg  prn... We reviewed prob list, meds, xrays and labs> see below>> EKG showed SBrady, rate50, wnl, NAD... LABS 8/13:  FLP- at goals on Simva40;  Chems- wnl;  CBC- wnl;  TSH=2.48;  VitD=55            PROBLEM LIST:   ABNORMAL CHEST XRAY (ICD-793.1) - she has a <1cm calcif nodule seen in LLL- likely granuloma, without serial change; She has a Nebulizer w/ Albut for prn use. ~  CXR 6/09 showed no change... ~  CXR 3/10 showed borderline Cor size, NAD.Amy Williamson. ~  CXR 8/11 showed borderline cor, ectatic Ao, clear lungs, mild DJD spine. ~  CXR 8/12 showed borderline cor, clear lungs, NAD.Amy KitchenMarland Williamson ~  10/12:  She developed cough, sput w/ streaky hemoptysis, SOB, etc; went to [facility] w/ CXR showing right pneumonia; treated w/ IM Rocephin & po Avelox; f/u here & slowly improved w/ CXR 05/29/11 clear & back to baseline...  HYPERTENSION (ICD-401.9) & Hx of PALPITATIONS (ICD-785.1) - on ASA 81mg /d + ATENOLOL 50mg /d...  ~  8/12:  BP= 130/70 and asymptomatic... denies HA, fatigue, visual changes, CP, palipit, dizziness, syncope, dyspnea, edema, etc... ~  11/12:  BP= 146/86 & she is improving clinically from her recent bout of pneumonia. ~  8/13:  BP= 128/82 & she denies HA, CP, palpit, SOB, edema...   VENOUS INSUFFICIENCY (ICD-459.81) - she has mod VI changes and trace edema... she knows to elim sodium, elevate, wear TED hose, etc...  HYPERCHOLESTEROLEMIA (ICD-272.0) - on SIMVASTATIN 40mg /d... ~  FLP 6/08 showed TChol 138, Tg 75, HDL  49, LDL 74... continue same. ~  FLP 6/09 showed TChol 143, TG 98, HDL 55, LDL 68 ~  FLP 7/10 showed TChol 150, TG 104, HDL 56, LDL 73 ~  FLP 8/11 on Simva40 showed TChol 160, TG 119, HDL 56, LDL 80 ~  FLP 8/12 on Simva40 showed TChol 140, TG 76, HDL 59, LDL 66 ~  FLP 8/13 on Simva40 showed TChol 160, TG 127, HDL 57, LDL 77   OBESITY (ZOX-096.04) - she is 5\' 1"  tall and her weights of 190 -220 w/ BMI 36 - 42 ~  weight 6/08 = 163# ~  weight 7/09 = 188# ~  weight 3/10 = 217# ~  weight 8/11 = 211# ~  Weight 8/12 = 200# ~  Weight 8/13 = 203#  GASTROESOPHAGEAL REFLUX DISEASE (ICD-530.81) - uses OTC Prilosec as needed & ANUSOL HC cream prn... ~  routine colonoscopy 9/03 by [name] was WNL... f/u planned 55yrs. ~  8/13:  She had recent BRB w/ Hem's suspected & treated w/ AnusolHC cream; due for f/u colon which is pending...  DEGENERATIVE JOINT DISEASE >> FIBROMYALGIA (ICD-729.1) - symptoms suggesting FM in the past... ortho eval from [name]> right 4th trigger finger surg 6/12. ~  1/13: she had left knee surg by [name] w/ partial medial & lateral meniscectomies & shaving of the femoral condyles, plus excision of large Baker's cyst from left knee...  MIGRAINES, HX OF (ICD-V13.8) - she has hx of migraines and notes some HA when lying back & occiput touches pillow at night... headaches have improved over time, using TYLENOL Prn...  ANXIETY & DEPRESSION (ICD-311) - under mod stress caring for husb w/ end-stage MS... ~  7/10: notes that Hospice signed off & now getting "care management" w/ [facility]- 10H per week help at home... ALPRAZOLAM 0.5mg  Prn.  Health Maintenance - she had PNEUMOVAX 2006, TETANUS shot 2006, & yearly Flu shots... GYN= DrMiller for PAP, [facility], and BMD reported normal per pt... she takes Calcium, MVI, Vit D supplement...   Past Surgical History  Procedure Date  . Pulley release right trigger finger 01-08-2011  . Carpal  tunnel release 2004    RIGHT  . Carpal tunnel release 2006    LEFT  . Ear cyst excision 08/03/2011    Procedure: CYST REMOVAL;  Surgeon: Illene Labrador Aplington;  Location: [facility];  Service: Orthopedics;  Laterality: N/A;  EXCISION OF POPITEAL CYST   . Knee surgery     lt  . Leg cyst removal      Outpatient Encounter Prescriptions as of 03/03/2012  Medication Sig Dispense Refill  .  albuterol (PROVENTIL) (2.5 MG/3ML) 0.083% nebulizer solution Take by nebulization as needed. 1 vial 2-3 times a day      . ALPRAZolam (XANAX) 0.5 MG tablet 0.5 mg as needed. Take 1/2 to 1 tablet three times a day as needed.      . Ascorbic Acid (VITAMIN C) 1000 MG tablet Take 1,000 mg by mouth daily.       Amy Williamson aspirin 81 MG tablet Take 81 mg by mouth daily.       Amy Williamson atenolol (TENORMIN) 50 MG tablet Take 50 mg by mouth every morning.      . Calcium Citrate-Vitamin D (CITRACAL + D PO) Take 1 tablet by mouth daily.      . cholecalciferol (VITAMIN D) 1000 UNITS tablet Take 1,000 Units by mouth daily.      . hydrocortisone (ANUSOL-HC) 25 MG suppository Place 1 suppository (25 mg total) rectally at bedtime.  7 suppository  1  . ibuprofen (ADVIL,MOTRIN) 200 MG tablet Take 200 mg by mouth every 6 (six) hours as needed.      . Multiple Vitamin (MULTIVITAMIN) tablet Take 1 tablet by mouth daily.       . Multiple Vitamins-Minerals (ANTIOXIDANT FORMULA) CAPS Take 1 capsule by mouth daily.      . simvastatin (ZOCOR) 40 MG tablet Take 1 tablet (40 mg total) by mouth at bedtime.  31 tablet  11  . zinc gluconate 50 MG tablet Take 50 mg by mouth daily.        Allergies  Allergen Reactions  . Avelox (Moxifloxacin Hcl In Nacl)     Pt did not like side effects     Current Medications, Allergies, Past Medical History, Past Surgical History, Family History, and Social History were reviewed in [employer] record.    Review of Systems         The patient complains of sl cough, fatigue,  dyspnea on exertion, and depression.  The patient denies fever, chills, sweats, anorexia, weakness, malaise, weight loss, sleep disorder, blurring, diplopia, eye irritation, eye discharge, vision loss, eye pain, photophobia, earache, ear discharge, tinnitus, decreased hearing, nasal congestion, nosebleeds, sore throat, hoarseness, chest pain, palpitations, syncope, orthopnea, PND, peripheral edema, dyspnea at rest, excessive sputum, hemoptysis, wheezing, pleurisy, nausea, vomiting, diarrhea, constipation, change in bowel habits, abdominal pain, melena, hematochezia, jaundice, gas/bloating, indigestion/heartburn, dysphagia, odynophagia, dysuria, hematuria, urinary frequency, urinary hesitancy, nocturia, incontinence, back pain, joint pain, joint swelling, muscle cramps, muscle weakness, stiffness, arthritis, sciatica, restless legs, leg pain at night, leg pain with exertion, rash, itching, dryness, suspicious lesions, paralysis, paresthesias, seizures, tremors, vertigo, transient blindness, frequent falls, frequent headaches, difficulty walking, anxiety, memory loss, confusion, cold intolerance, heat intolerance, polydipsia, polyphagia, polyuria, unusual weight change, abnormal bruising, bleeding, enlarged lymph nodes, urticaria, allergic rash, hay fever, and recurrent infections.     Objective:   Physical Exam    WD, WN, 61 y/o WF in NAD... GENERAL:  Alert & oriented; pleasant & cooperative... HEENT:  [place]/AT, EOM-wnl, PERRLA, EACs-clear, TMs-wnl, NOSE-clear, THROAT-clear & wnl. NECK:  Supple w/ full ROM; no JVD; normal carotid impulses w/o bruits; no thyromegaly or nodules palpated; no lymphadenopathy. CHEST:  Clear to P & A; without wheezes/ rales/ or rhonchi. HEART:  Regular Rhythm; without murmurs/ rubs/ or gallops. ABDOMEN:  Soft & nontender; normal bowel sounds; no organomegaly or masses detected. EXT: without deformities, mild arthritic changes; no varicose veins/ venous insuffic/ or edema. NEURO:   CN's intact; motor testing normal; sensory testing normal; gait normal & balance  OK. DERM:  No lesions noted; no rash etc...  RADIOLOGY DATA:  Reviewed in the EPIC EMR & discussed w/ the patient...  LABORATORY DATA:  Reviewed in the EPIC EMR & discussed w/ the patient...   Assessment & Plan:   s/p PNEUMONIA>  She is reported to have had a right sided infiltrate (we don't have films); now resolved after antibiotic Rx; slowing improving clinically & rec- rest, fluids, tylenol, mucinex, etc ==> back to baseline & no residual problems encountered...  Hx of Abnormal CXR> <1cm LLL calcif nodule= likely granuloma, no serial changes...  HBP>  Remains well controlled on Atenolol 50mg /d...  Ven Insuffic>  Be sure to avaoid sodium, elev legs, wera support hose as discussed..  CHOL>  FLP continues to look good on the simva40...  Obesity>  Weight is down 5% to 200#  & clinically improved...  GERD>  She uses Prilosec OTC as needed...  DJD/ FM>  She manages well & advised Yoga, stretching, etc; she had left knee surg by [name] 1/13...  Anxiety & Depression>  She has Alpraz for prn use, but [place]...   Patient's Medications  New Prescriptions   No medications on file  Previous Medications   ALBUTEROL (PROVENTIL) (2.5 MG/3ML) 0.083% NEBULIZER SOLUTION    Take by nebulization as needed. 1 vial 2-3 times a day   ASCORBIC ACID (VITAMIN C) 1000 MG TABLET    Take 1,000 mg by mouth daily.    ASPIRIN 81 MG TABLET    Take 81 mg by mouth daily.    CALCIUM CITRATE-VITAMIN D (CITRACAL + D PO)    Take 1 tablet by mouth daily.   CHOLECALCIFEROL (VITAMIN D) 1000 UNITS TABLET    Take 1,000 Units by mouth daily.   HYDROCORTISONE (ANUSOL-HC) 25 MG SUPPOSITORY    Place 1 suppository (25 mg total) rectally at bedtime.   IBUPROFEN (ADVIL,MOTRIN) 200 MG TABLET    Take 200 mg by mouth every 6 (six) hours as needed.   MULTIPLE VITAMIN (MULTIVITAMIN) TABLET     Take 1 tablet by mouth daily.    MULTIPLE VITAMINS-MINERALS (ANTIOXIDANT FORMULA) CAPS    Take 1 capsule by mouth daily.   ZINC GLUCONATE 50 MG TABLET    Take 50 mg by mouth daily.  Modified Medications   Modified Medication Previous Medication   ALPRAZOLAM (XANAX) 0.5 MG TABLET ALPRAZolam (XANAX) 0.5 MG tablet      Take 1 tablet (0.5 mg total) by mouth as needed. Take 1/2 to 1 tablet three times a day as needed.    0.5 mg as needed. Take 1/2 to 1 tablet three times a day as needed.   ATENOLOL (TENORMIN) 50 MG TABLET atenolol (TENORMIN) 50 MG tablet      Take 1 tablet (50 mg total) by mouth every morning.    Take 50 mg by mouth every morning.   SIMVASTATIN (ZOCOR) 40 MG TABLET simvastatin (ZOCOR) 40 MG tablet      Take 1 tablet (40 mg total) by mouth at bedtime.    Take 1 tablet (40 mg total) by mouth at bedtime.  Discontinued Medications   No medications on file

## 2012-03-03 NOTE — Patient Instructions (Addendum)
Today we updated your med list in our EPIC system...    Continue your current medications the same...  Today we did your follow up FASTING blood work...    We will call you w/ the results...  Let's get on track w/ our diet & exercise program...    The goal is to lose 15-20 lbs...  Call for any problems...  Let's plan a follow up visit in 6 months.Marland KitchenMarland Kitchen

## 2012-03-04 LAB — VITAMIN D 25 HYDROXY (VIT D DEFICIENCY, FRACTURES): Vit D, 25-Hydroxy: 55 ng/mL (ref 30–89)

## 2012-03-12 ENCOUNTER — Ambulatory Visit (AMBULATORY_SURGERY_CENTER): Payer: 59 | Admitting: Internal Medicine

## 2012-03-12 ENCOUNTER — Encounter: Payer: Self-pay | Admitting: Internal Medicine

## 2012-03-12 VITALS — BP 131/75 | HR 53 | Temp 96.7°F | Resp 13 | Ht 60.6 in | Wt 204.0 lb

## 2012-03-12 DIAGNOSIS — Z1211 Encounter for screening for malignant neoplasm of colon: Secondary | ICD-10-CM

## 2012-03-12 DIAGNOSIS — K625 Hemorrhage of anus and rectum: Secondary | ICD-10-CM

## 2012-03-12 MED ORDER — SODIUM CHLORIDE 0.9 % IV SOLN: 500.0000 mL | INTRAVENOUS | Status: DC

## 2012-03-12 NOTE — Patient Instructions (Addendum)

## 2012-03-12 NOTE — Op Note (Signed)
[facility] [address] [place], [address]   COLONOSCOPY PROCEDURE REPORT  PATIENT: Amy, Williamson  MR#: [id number] BIRTHDATE: 1950/08/06 , 61  yrs. old GENDER: Female ENDOSCOPIST: Hart Carwin, MD  PROCEDURE DATE:  03/12/2012 PROCEDURE:   Colonoscopy, screening ASA CLASS:   Class II INDICATIONS:last colon 2003 was normal and average risk patient for colon cancer. MEDICATIONS: MAC sedation, administered by CRNA and propofol (Diprivan) 150mg  IV  DESCRIPTION OF PROCEDURE:   After the risks and benefits and of the procedure were explained, informed consent was obtained.  A digital rectal exam revealed no abnormalities of the rectum.    The LB [id number]  endoscope was introduced through the anus and advanced to the cecum, which was identified by both the appendix and ileocecal valve .  The quality of the prep was good, using MoviPrep .  The instrument was then slowly withdrawn as the colon was fully examined.     COLON FINDINGS: Mild diverticulosis was noted in the sigmoid colon. Retroflexed views revealed no abnormalities.     The scope was then withdrawn from the patient and the procedure completed.  COMPLICATIONS: There were no complications. ENDOSCOPIC IMPRESSION: Mild diverticulosis was noted in the sigmoid colon low volume rectal bleeding was due to small internal hemorrhoids  RECOMMENDATIONS: High fiber diet.   REPEAT EXAM: In 10 year(s)  for Colonoscopy.  XB:JYNWG Elayne Snare, MD  _______________________________ eSigned:  Hart Carwin, MD 03/12/2012 10:53 AM

## 2012-03-12 NOTE — Progress Notes (Signed)
Patient did not experience any of the following events: a burn prior to discharge; a fall within the facility; wrong site/side/patient/procedure/implant event; or a hospital transfer or hospital admission upon discharge from the facility. (G8907) Patient did not have preoperative order for IV antibiotic SSI prophylaxis. (G8918)  

## 2012-03-13 ENCOUNTER — Telehealth: Payer: Self-pay | Admitting: *Deleted

## 2012-03-13 NOTE — Telephone Encounter (Signed)
  Follow up Call-  Call back number 03/12/2012  Post procedure Call Back phone  # [phone]  Permission to leave phone message Yes     Patient questions:  Do you have a fever, pain , or abdominal swelling? no Pain Score  0 *  Have you tolerated food without any problems? yes  Have you been able to return to your normal activities? yes  Do you have any questions about your discharge instructions: Diet   no Medications  no Follow up visit  no  Do you have questions or concerns about your Care? no  Actions: * If pain score is 4 or above: No action needed, pain <4.

## 2012-09-03 ENCOUNTER — Encounter: Payer: Self-pay | Admitting: Pulmonary Disease

## 2012-09-03 ENCOUNTER — Other Ambulatory Visit (INDEPENDENT_AMBULATORY_CARE_PROVIDER_SITE_OTHER): Payer: 59

## 2012-09-03 ENCOUNTER — Ambulatory Visit (INDEPENDENT_AMBULATORY_CARE_PROVIDER_SITE_OTHER): Payer: 59 | Admitting: Pulmonary Disease

## 2012-09-03 VITALS — BP 140/90 | HR 55 | Temp 97.8°F | Ht 60.5 in | Wt 193.4 lb

## 2012-09-03 DIAGNOSIS — E669 Obesity, unspecified: Secondary | ICD-10-CM

## 2012-09-03 DIAGNOSIS — I872 Venous insufficiency (chronic) (peripheral): Secondary | ICD-10-CM

## 2012-09-03 DIAGNOSIS — K649 Unspecified hemorrhoids: Secondary | ICD-10-CM

## 2012-09-03 DIAGNOSIS — E7800 Pure hypercholesterolemia, unspecified: Secondary | ICD-10-CM

## 2012-09-03 DIAGNOSIS — F329 Major depressive disorder, single episode, unspecified: Secondary | ICD-10-CM

## 2012-09-03 DIAGNOSIS — K219 Gastro-esophageal reflux disease without esophagitis: Secondary | ICD-10-CM

## 2012-09-03 DIAGNOSIS — F341 Dysthymic disorder: Secondary | ICD-10-CM

## 2012-09-03 DIAGNOSIS — K5730 Diverticulosis of large intestine without perforation or abscess without bleeding: Secondary | ICD-10-CM

## 2012-09-03 DIAGNOSIS — I10 Essential (primary) hypertension: Secondary | ICD-10-CM

## 2012-09-03 LAB — LIPID PANEL
Cholesterol: 204 mg/dL — ABNORMAL HIGH (ref 0–200)
HDL: 53.9 mg/dL (ref >39.00)
Total CHOL/HDL Ratio: 4
Triglycerides: 94 mg/dL (ref 0.0–149.0)
VLDL: 18.8 mg/dL (ref 0.0–40.0)

## 2012-09-03 LAB — HEPATIC FUNCTION PANEL
Albumin: 4.4 g/dL (ref 3.5–5.2)
Total Bilirubin: 0.8 mg/dL (ref 0.3–1.2)

## 2012-09-03 LAB — LDL CHOLESTEROL, DIRECT: Direct LDL: 123.2 mg/dL

## 2012-09-03 NOTE — Progress Notes (Signed)
Subjective:    Patient ID: Amy Williamson, female    DOB: 07-26-1950, 62 y.o.   MRN: [id number]  HPI 62 y/o WF here for a yearly follow up visit...   ~  March 05, 2011:  Yearly CPX & Amy Williamson is holding up well, under lots of stress caring for husb Amy Williamson w/ MS;  BP remains controlled & she denies CP, palpit, dizzy, syncope, edema, etc;  Chol continues to look good on Simva40 + her diet & nice 11# wt loss this yr;  Mild reflux symptoms controlled on her Prilosec;  FM is still an issue but she copes very well...    Mammogram & Ultrasound 4/12 showed benign left breast cyst...    [name] did right 4th trigger finger surg 6/12> improved...  ~  May 29, 2011:  28mo ROV & add-on to follow up recent pneumonia> she went to [name] 10/21 w/ cough, yellow sput w/ streaky hemoptysis, low grade fever, congestion , SOB, some wheezing noted; CXR reported to show infiltrate on right & she was givenRocephin IM & Avelox po, plus Hycodan cough syrup; she improved slowly on this regimen, had some sore ribs & lingering symptoms; she saw TP 10/26 & was sl improved, asked to continue meds & f/u w/ me today> sl dry cough persists and min clear sputum, still sl weak & recouperating slowly; CXR today is clear & ident to her CPX film 03/05/11... NOTE: pt had Pneumovax 2006... She also tells me that Amy Williamson will be attended by the group: [facility]...  ~  March 03, 2012:  17mo ROV & Amy Williamson has been doing ok since her husb, Amy Williamson, passed away...    Hx Pneumonia/ LLL granuloma> prev pneumonia from 2012 & known LLL granuloma; she has NEB w/ Albut for prn use, no recent breathing problems; CXR last 11/12 was clear baseline film...    HBP> on Aten50;  BP= 128/82 & she denies HA, CP, palpit, SOB, edema...    VenInsuffic> on low sodium, elevation, support hose as needed...    Chol> on Simva40;  FLP showed TChol 160, TG 127, HDL 57, LDL 77    Obesity> weight remains ~200# despite her best effort w/  diet, exercise, etc...    GI> GERD, Hems> on Prilosec OTC as needed & recent hem prob treated w/ AnusolHC cream...    DJD/ FM/ LBP> uses rest, heat, Advil, etc...    Anxiety/ Depression> on Xanax 0.5mg  prn... We reviewed prob list, meds, xrays and labs> see below>> EKG showed SBrady, rate50, wnl, NAD... LABS 8/13:  FLP- at goals on Simva40;  Chems- wnl;  CBC- wnl;  TSH=2.48;  VitD=55  ~  February 12. 2014:  8mo ROV & Kamoria tells me she decided to stop the Simva40 despite being at goal numbers on this med- she has started a new OTC prep called PROTANDIM on advise from her niece who saw her Chol go from 250 to 180 on this product (her church is selling it & a wt loss program); she notes that there is liver disease in her family & was scared of the Statin despite her normal liver enzymes; Amy Williamson has lost 10# down to 193# on her diet so far...    Hx Pneumonia/ LLL granuloma> prev pneumonia from 2012 & known LLL granuloma; she has NEB w/ Albut for prn use, no recent breathing problems; CXR last 11/12 was clear baseline film...    HBP> on Aten50;  BP= 140/90 & she denies HA,  CP, palpit, SOB, edema...    VenInsuffic> on low sodium, elevation, support hose as needed...    Chol> she stopped Simva40 on her own & started supplement rec by her niece- Protandim; FLP shows TChol 204, TG 94, HDL 54, LDL 123 despite a 16# wt loss on diet; she will decide whether to continue the Prodandim or go back on the statin...    Obesity> weight is down 10# on diet to 193# with her best efforts at diet, exercise, etc...    GI> GERD, Hems> on Prilosec OTC as needed & recent hem prob treated w/ AnusolHC cream; colonoscopy 8/13 by [name] showed divertics & sm int hems; f/u 31yrs...    DJD/ FM/ LBP> uses rest, heat, Advil, etc...    Anxiety/ Depression> on Xanax 0.5mg  prn... We reviewed prob list, meds, xrays and labs> see below for updates >>  LABS 2/14:  FLP- ok x LDL=123 off the Simva40;  LFTs- wnl...              PROBLEM LIST:   ABNORMAL CHEST XRAY (ICD-793.1) - she has a <1cm calcif nodule seen in LLL- likely granuloma, without serial change; She has a Nebulizer w/ Albut for prn use. ~  CXR 6/09 showed no change... ~  CXR 3/10 showed borderline Cor size, NAD.[name]. ~  CXR 8/11 showed borderline cor, ectatic Ao, clear lungs, mild DJD spine. ~  CXR 8/12 showed borderline cor, clear lungs, NAD... ~  10/12:  She developed cough, sput w/ streaky hemoptysis, SOB, etc; went to [facility] w/ CXR showing right pneumonia; treated w/ IM Rocephin & po Avelox; f/u here & slowly improved w/ CXR 05/29/11 clear & back to baseline...  HYPERTENSION (ICD-401.9) & Hx of PALPITATIONS (ICD-785.1) - on ASA 81mg /d + ATENOLOL 50mg /d...  ~  8/12:  BP= 130/70 and asymptomatic... denies HA, fatigue, visual changes, CP, palipit, dizziness, syncope, dyspnea, edema, etc... ~  11/12:  BP= 146/86 & she is improving clinically from her recent bout of pneumonia. ~  8/13:  BP= 128/82 & she denies HA, CP, palpit, SOB, edema... EKG showed SBrady, rate50, wnl, NAD... ~  on Aten50;  BP= 140/90 & she denies HA, CP, palpit, SOB, edema...   VENOUS INSUFFICIENCY (ICD-459.81) - she has mod VI changes and trace edema... she knows to elim sodium, elevate, wear TED hose, etc...  HYPERCHOLESTEROLEMIA (ICD-272.0) - on SIMVASTATIN 40mg /d... ~  FLP 6/08 showed TChol 138, Tg 75, HDL 49, LDL 74... continue same. ~  FLP 6/09 showed TChol 143, TG 98, HDL 55, LDL 68 ~  FLP 7/10 showed TChol 150, TG 104, HDL 56, LDL 73 ~  FLP 8/11 on Simva40 showed TChol 160, TG 119, HDL 56, LDL 80 ~  FLP 8/12 on Simva40 showed TChol 140, TG 76, HDL 59, LDL 66 ~  FLP 8/13 on Simva40 showed TChol 160, TG 127, HDL 57, LDL 77  ~  FLP 2/14 off Simva on Protandim showed TChol 204, TG 94, HDL 54, LDL 123... She will decide what to take.  OBESITY (ICD-278.00) - she is 5\' 1"  tall and her weights of 190 -220 w/ BMI 36 - 42 ~  weight 6/08 = 163# ~  weight 7/09 = 188# ~  weight 3/10 =  217# ~  weight 8/11 = 211# ~  Weight 8/12 = 200# ~  Weight 8/13 = 203# ~  Weight 2/14 = 193#  GASTROESOPHAGEAL REFLUX DISEASE (ICD-530.81) - uses OTC Prilosec as needed & ANUSOL HC cream prn... ~  routine  colonoscopy 9/03 by [name] was WNL... f/u planned 43yrs. ~  8/13:  She had recent BRB w/ Hem's suspected & treated w/ AnusolHC cream; due for f/u colon=> done 8/13 by [name] & showed mild divertics in sigmoid, & sm int hems believed to be the source of low vol rectal bleeding & rec to get on hi fiberr diet... F/u planned 82yrs.  DEGENERATIVE JOINT DISEASE >> FIBROMYALGIA (ICD-729.1) - symptoms suggesting FM in the past... ortho eval from [name]> right 4th trigger finger surg 6/12. ~  1/13: she had left knee surg by [name] w/ partial medial & lateral meniscectomies & shaving of the femoral condyles, plus excision of large Baker's cyst from left knee...  MIGRAINES, HX OF (ICD-V13.8) - she has hx of migraines and notes some HA when lying back & occiput touches pillow at night... headaches have improved over time, using TYLENOL Prn...  ANXIETY & DEPRESSION (ICD-311) - under mod stress caring for husb w/ end-stage MS... ~  7/10: notes that Hospice signed off & now getting "care management" w/ [facility]- 10H per week help at home... ALPRAZOLAM 0.5mg  Prn.  Health Maintenance - she had PNEUMOVAX 2006, TETANUS shot 2006, & yearly Flu shots... GYN= DrMiller for PAP, [facility], and BMD reported normal per pt... she takes Calcium, MVI, Vit D supplement...   Past Surgical History  Procedure Laterality Date  . Pulley release right trigger finger  01-08-2011  . Carpal tunnel release  2004    RIGHT  . Carpal tunnel release  2006    LEFT  . Ear cyst excision  08/03/2011    Procedure: CYST REMOVAL;  Surgeon: Illene Labrador Aplington;  Location:  SURGERY CENTER;  Service: Orthopedics;  Laterality: N/A;  EXCISION OF POPITEAL CYST   . Knee surgery       lt  . Leg cyst removal       Outpatient Encounter Prescriptions as of 09/03/2012  Medication Sig Dispense Refill  . albuterol (PROVENTIL) (2.5 MG/3ML) 0.083% nebulizer solution Take by nebulization as needed. 1 vial 2-3 times a day      . ALPRAZolam (XANAX) 0.5 MG tablet Take 1 tablet (0.5 mg total) by mouth as needed. Take 1/2 to 1 tablet three times a day as needed.  90 tablet  5  . aspirin 81 MG tablet Take 81 mg by mouth daily.       [name] atenolol (TENORMIN) 50 MG tablet Take 1 tablet (50 mg total) by mouth every morning.  31 tablet  11  . cholecalciferol (VITAMIN D) 1000 UNITS tablet Take 1,000 Units by mouth daily.      . hydrocortisone (ANUSOL-HC) 25 MG suppository Place 1 suppository (25 mg total) rectally at bedtime.  7 suppository  1  . ibuprofen (ADVIL,MOTRIN) 200 MG tablet Take 200 mg by mouth every 6 (six) hours as needed.      . Multiple Vitamin (MULTIVITAMIN) tablet Take 1 tablet by mouth daily.       . Multiple Vitamins-Minerals (ANTIOXIDANT FORMULA) CAPS Take 1 capsule by mouth daily.      . simvastatin (ZOCOR) 40 MG tablet Take 1 tablet (40 mg total) by mouth at bedtime.  31 tablet  11  . [DISCONTINUED] Ascorbic Acid (VITAMIN C) 1000 MG tablet Take 1,000 mg by mouth daily.       . [DISCONTINUED] Calcium Citrate-Vitamin D (CITRACAL + D PO) Take 1 tablet by mouth daily.      . [DISCONTINUED] zinc gluconate 50 MG  tablet Take 50 mg by mouth daily.       No facility-administered encounter medications on file as of 09/03/2012.    Allergies  Allergen Reactions  . Avelox (Moxifloxacin Hcl In Nacl)     Pt did not like side effects     Current Medications, Allergies, Past Medical History, Past Surgical History, Family History, and Social History were reviewed in [employer] record.    Review of Systems         The patient complains of sl cough, fatigue, dyspnea on exertion, and depression.  The patient denies fever, chills, sweats, anorexia, weakness,  malaise, weight loss, sleep disorder, blurring, diplopia, eye irritation, eye discharge, vision loss, eye pain, photophobia, earache, ear discharge, tinnitus, decreased hearing, nasal congestion, nosebleeds, sore throat, hoarseness, chest pain, palpitations, syncope, orthopnea, PND, peripheral edema, dyspnea at rest, excessive sputum, hemoptysis, wheezing, pleurisy, nausea, vomiting, diarrhea, constipation, change in bowel habits, abdominal pain, melena, hematochezia, jaundice, gas/bloating, indigestion/heartburn, dysphagia, odynophagia, dysuria, hematuria, urinary frequency, urinary hesitancy, nocturia, incontinence, back pain, joint pain, joint swelling, muscle cramps, muscle weakness, stiffness, arthritis, sciatica, restless legs, leg pain at night, leg pain with exertion, rash, itching, dryness, suspicious lesions, paralysis, paresthesias, seizures, tremors, vertigo, transient blindness, frequent falls, frequent headaches, difficulty walking, anxiety, memory loss, confusion, cold intolerance, heat intolerance, polydipsia, polyphagia, polyuria, unusual weight change, abnormal bruising, bleeding, enlarged lymph nodes, urticaria, allergic rash, hay fever, and recurrent infections.     Objective:   Physical Exam    WD, WN, 62 y/o WF in NAD... GENERAL:  Alert & oriented; pleasant & cooperative... HEENT:  [place]/AT, EOM-wnl, PERRLA, EACs-clear, TMs-wnl, NOSE-clear, THROAT-clear & wnl. NECK:  Supple w/ full ROM; no JVD; normal carotid impulses w/o bruits; no thyromegaly or nodules palpated; no lymphadenopathy. CHEST:  Clear to P & A; without wheezes/ rales/ or rhonchi. HEART:  Regular Rhythm; without murmurs/ rubs/ or gallops. ABDOMEN:  Soft & nontender; normal bowel sounds; no organomegaly or masses detected. EXT: without deformities, mild arthritic changes; no varicose veins/ venous insuffic/ or edema. NEURO:  CN's intact; motor testing normal; sensory testing normal; gait normal & balance OK. DERM:  No  lesions noted; no rash etc...  RADIOLOGY DATA:  Reviewed in the EPIC EMR & discussed w/ the patient...  LABORATORY DATA:  Reviewed in the EPIC EMR & discussed w/ the patient...   Assessment & Plan:    s/p PNEUMONIA>  She is reported to have had a right sided infiltrate (we don't have films)=> resolved after antibiotic Rx; improved clinically w/ rest, fluids, tylenol, mucinex, etc ==> back to baseline & no residual problems encountered...  Hx of Abnormal CXR> <1cm LLL calcif nodule= likely granuloma, no serial changes...  HBP>  Remains well controlled on Atenolol 50mg /d, wt reduction should help as well...  Ven Insuffic>  Be sure to avoid sodium, elev legs, wera support hose as discussed..  CHOL>  AS ABOVE- LDL up to 123 off the Simva40 on Protandim; she will decide what to do...  Obesity>  Weight is down to 193#  & clinically improved...  GERD>  She uses Prilosec OTC as needed... S/p colonoscopy 8/13 w/ divertics, int hems...  DJD/ FM>  She manages well & advised Yoga, stretching, etc; she had left knee surg by [name] 1/13...  Anxiety & Depression>  She has Alpraz for prn use...   Patient's Medications  New Prescriptions   No medications on file  Previous Medications   ALBUTEROL (PROVENTIL) (2.5 MG/3ML) 0.083% NEBULIZER SOLUTION  Take by nebulization as needed. 1 vial 2-3 times a day   ALPRAZOLAM (XANAX) 0.5 MG TABLET    Take 1 tablet (0.5 mg total) by mouth as needed. Take 1/2 to 1 tablet three times a day as needed.   ASPIRIN 81 MG TABLET    Take 81 mg by mouth daily.    ATENOLOL (TENORMIN) 50 MG TABLET    Take 1 tablet (50 mg total) by mouth every morning.   CHOLECALCIFEROL (VITAMIN D) 1000 UNITS TABLET    Take 1,000 Units by mouth daily.   HYDROCORTISONE (ANUSOL-HC) 25 MG SUPPOSITORY    Place 1 suppository (25 mg total) rectally at bedtime.   IBUPROFEN (ADVIL,MOTRIN) 200 MG TABLET    Take 200 mg by mouth every 6 (six) hours as needed.   MULTIPLE VITAMIN  (MULTIVITAMIN) TABLET    Take 1 tablet by mouth daily.    MULTIPLE VITAMINS-MINERALS (ANTIOXIDANT FORMULA) CAPS    Take 1 capsule by mouth daily.  Modified Medications   No medications on file  Discontinued Medications   ASCORBIC ACID (VITAMIN C) 1000 MG TABLET    Take 1,000 mg by mouth daily.    CALCIUM CITRATE-VITAMIN D (CITRACAL + D PO)    Take 1 tablet by mouth daily.   SIMVASTATIN (ZOCOR) 40 MG TABLET    Take 1 tablet (40 mg total) by mouth at bedtime.   ZINC GLUCONATE 50 MG TABLET    Take 50 mg by mouth daily.

## 2012-09-03 NOTE — Patient Instructions (Addendum)
Today we updated your med list in our EPIC system...    Continue your current medications the same...  Today we did your follow up Lipid profile on the Protandium...    We will call you w/ the results when avail...  Keep up the great job w/ diet & exercise!!!  Call for any questions...  Let's plan a follow up in 6 months w/ full fasting labs, CXR & EKG at that time.Marland KitchenMarland KitchenMarland Kitchen

## 2012-10-10 ENCOUNTER — Other Ambulatory Visit: Payer: Self-pay

## 2012-10-10 DIAGNOSIS — Z1231 Encounter for screening mammogram for malignant neoplasm of breast: Secondary | ICD-10-CM

## 2012-11-18 ENCOUNTER — Ambulatory Visit
Admission: RE | Admit: 2012-11-18 | Discharge: 2012-11-18 | Disposition: A | Discharge status: Home / Self Care | Payer: 59 | Source: Ambulatory Visit

## 2012-11-18 DIAGNOSIS — Z1231 Encounter for screening mammogram for malignant neoplasm of breast: Secondary | ICD-10-CM

## 2013-02-17 ENCOUNTER — Telehealth: Payer: Self-pay | Admitting: Pulmonary Disease

## 2013-02-17 DIAGNOSIS — K625 Hemorrhage of anus and rectum: Secondary | ICD-10-CM

## 2013-02-17 DIAGNOSIS — E7800 Pure hypercholesterolemia, unspecified: Secondary | ICD-10-CM

## 2013-02-17 DIAGNOSIS — F329 Major depressive disorder, single episode, unspecified: Secondary | ICD-10-CM

## 2013-02-17 DIAGNOSIS — I10 Essential (primary) hypertension: Secondary | ICD-10-CM

## 2013-02-17 NOTE — Telephone Encounter (Signed)
Pt last OV 08/24/12.  Pending 03/24/13 w/ fasting labs/CXR/EKG Please advise what labs needs to be done SN thanks

## 2013-02-18 NOTE — Telephone Encounter (Signed)
Pt returned call.  Advised pt it is OK to come in 1 WEEK prior to her OV w/ SN.  Pt verbalized understanding & states nothing further needed at this time.  Antionette Fairy

## 2013-02-18 NOTE — Telephone Encounter (Signed)
lmtcb x1 

## 2013-02-18 NOTE — Telephone Encounter (Signed)
Per SN--  Ok to come in 1 WEEK prior to her ov with SN.   Labs have been placed in the computer for her.  thanks

## 2013-03-03 ENCOUNTER — Ambulatory Visit: Payer: 59 | Admitting: Pulmonary Disease

## 2013-03-16 ENCOUNTER — Other Ambulatory Visit (INDEPENDENT_AMBULATORY_CARE_PROVIDER_SITE_OTHER): Payer: 59

## 2013-03-16 DIAGNOSIS — I10 Essential (primary) hypertension: Secondary | ICD-10-CM

## 2013-03-16 DIAGNOSIS — E7800 Pure hypercholesterolemia, unspecified: Secondary | ICD-10-CM

## 2013-03-16 DIAGNOSIS — F329 Major depressive disorder, single episode, unspecified: Secondary | ICD-10-CM

## 2013-03-16 DIAGNOSIS — K625 Hemorrhage of anus and rectum: Secondary | ICD-10-CM

## 2013-03-16 DIAGNOSIS — F341 Dysthymic disorder: Secondary | ICD-10-CM

## 2013-03-16 LAB — CBC WITH DIFFERENTIAL/PLATELET
Basophils Absolute: 0 10*3/uL (ref 0.0–0.1)
Eosinophils Relative: 1.9 % (ref 0.0–5.0)
HCT: 40.3 % (ref 36.0–46.0)
Hemoglobin: 13.7 g/dL (ref 12.0–15.0)
Lymphocytes Relative: 28.3 % (ref 12.0–46.0)
Lymphs Abs: 1.9 10*3/uL (ref 0.7–4.0)
Monocytes Relative: 5.6 % (ref 3.0–12.0)
Neutro Abs: 4.2 10*3/uL (ref 1.4–7.7)
RBC: 4.71 Mil/uL (ref 3.87–5.11)
WBC: 6.6 10*3/uL (ref 4.5–10.5)

## 2013-03-16 LAB — HEPATIC FUNCTION PANEL
Albumin: 4.1 g/dL (ref 3.5–5.2)
Total Protein: 6.9 g/dL (ref 6.0–8.3)

## 2013-03-16 LAB — LIPID PANEL
Cholesterol: 210 mg/dL — ABNORMAL HIGH (ref 0–200)
HDL: 47 mg/dL (ref >39.00)
Triglycerides: 128 mg/dL (ref 0.0–149.0)

## 2013-03-16 LAB — BASIC METABOLIC PANEL
BUN: 18 mg/dL (ref 6–23)
CO2: 29 mEq/L (ref 19–32)
Calcium: 9.6 mg/dL (ref 8.4–10.5)
Creatinine, Ser: 0.7 mg/dL (ref 0.4–1.2)
GFR: 94.37 mL/min (ref >60.00)
Glucose, Bld: 89 mg/dL (ref 70–99)
Sodium: 139 mEq/L (ref 135–145)

## 2013-03-24 ENCOUNTER — Ambulatory Visit (INDEPENDENT_AMBULATORY_CARE_PROVIDER_SITE_OTHER): Payer: 59 | Admitting: Pulmonary Disease

## 2013-03-24 ENCOUNTER — Ambulatory Visit (INDEPENDENT_AMBULATORY_CARE_PROVIDER_SITE_OTHER)
Admission: RE | Admit: 2013-03-24 | Discharge: 2013-03-24 | Disposition: A | Discharge status: Home / Self Care | Payer: 59 | Source: Ambulatory Visit | Attending: Pulmonary Disease | Admitting: Pulmonary Disease

## 2013-03-24 ENCOUNTER — Encounter: Payer: Self-pay | Admitting: Pulmonary Disease

## 2013-03-24 ENCOUNTER — Telehealth: Payer: Self-pay | Admitting: Pulmonary Disease

## 2013-03-24 VITALS — BP 138/78 | HR 46 | Temp 98.6°F | Ht 60.5 in | Wt 172.6 lb

## 2013-03-24 DIAGNOSIS — Z0000 Encounter for general adult medical examination without abnormal findings: Secondary | ICD-10-CM

## 2013-03-24 DIAGNOSIS — I10 Essential (primary) hypertension: Secondary | ICD-10-CM

## 2013-03-24 DIAGNOSIS — I480 Paroxysmal atrial fibrillation: Secondary | ICD-10-CM | POA: Insufficient documentation

## 2013-03-24 DIAGNOSIS — I872 Venous insufficiency (chronic) (peripheral): Secondary | ICD-10-CM

## 2013-03-24 DIAGNOSIS — E663 Overweight: Secondary | ICD-10-CM

## 2013-03-24 DIAGNOSIS — E7800 Pure hypercholesterolemia, unspecified: Secondary | ICD-10-CM

## 2013-03-24 DIAGNOSIS — F329 Major depressive disorder, single episode, unspecified: Secondary | ICD-10-CM

## 2013-03-24 DIAGNOSIS — I4891 Unspecified atrial fibrillation: Secondary | ICD-10-CM | POA: Insufficient documentation

## 2013-03-24 DIAGNOSIS — K219 Gastro-esophageal reflux disease without esophagitis: Secondary | ICD-10-CM

## 2013-03-24 DIAGNOSIS — M1990 Unspecified osteoarthritis, unspecified site: Secondary | ICD-10-CM

## 2013-03-24 DIAGNOSIS — R002 Palpitations: Secondary | ICD-10-CM

## 2013-03-24 MED ORDER — SIMVASTATIN 20 MG PO TABS: 20.0000 mg | ORAL_TABLET | Freq: Every evening | ORAL | Status: DC

## 2013-03-24 MED ORDER — LOSARTAN POTASSIUM 50 MG PO TABS: 50.0000 mg | ORAL_TABLET | Freq: Every day | ORAL | Status: DC

## 2013-03-24 MED ORDER — ATENOLOL 50 MG PO TABS: ORAL_TABLET | ORAL | Status: DC

## 2013-03-24 MED ORDER — ALPRAZOLAM 0.5 MG PO TABS: 0.5000 mg | ORAL_TABLET | ORAL | Status: DC | PRN

## 2013-03-24 MED ORDER — ALBUTEROL SULFATE (2.5 MG/3ML) 0.083% IN NEBU: 2.5000 mg | INHALATION_SOLUTION | RESPIRATORY_TRACT | Status: DC | PRN

## 2013-03-24 NOTE — Progress Notes (Signed)
Subjective:    Patient ID: Amy Williamson, female    DOB: [birth year], 63 y.o.   MRN: [id number]  HPI 62 y/o WF here for a yearly follow up visit...   ~  March 03, 2012:  34mo ROV & Amy Williamson has been doing ok since her husb, Amy Williamson, passed away...    Hx Pneumonia/ LLL granuloma> prev pneumonia from 2012 & known LLL granuloma; she has NEB w/ Albut for prn use, no recent breathing problems; CXR last 11/12 was clear baseline film...    HBP> on Aten50;  BP= 128/82 & she denies HA, CP, palpit, SOB, edema...    VenInsuffic> on low sodium, elevation, support hose as needed...    Chol> on Simva40;  FLP showed TChol 160, TG 127, HDL 57, LDL 77    Obesity> weight remains ~200# despite her best effort w/ diet, exercise, etc...    GI> GERD, Hems> on Prilosec OTC as needed & recent hem prob treated w/ AnusolHC cream...    DJD/ FM/ LBP> uses rest, heat, Advil, etc...    Anxiety/ Depression> on Xanax 0.5mg  prn... We reviewed prob list, meds, xrays and labs> see below>> EKG showed SBrady, rate50, wnl, NAD... LABS 8/13:  FLP- at goals on Simva40;  Chems- wnl;  CBC- wnl;  TSH=2.48;  VitD=55  ~  February 12. 2014:  40mo ROV & Amy Williamson tells me she decided to stop the Simva40 despite being at goal numbers on this med- she has started a new OTC prep called PROTANDIM on advise from her niece who saw her Chol go from 250 to 180 on this product (her church is selling it & a wt loss program); she notes that there is liver disease in her family & was scared of the Statin despite her normal liver enzymes; Amy Williamson has lost 10# down to 193# on her diet so far...    Hx Pneumonia/ LLL granuloma> prev pneumonia from 2012 & known LLL granuloma; she has NEB w/ Albut for prn use, no recent breathing problems; CXR last 11/12 was clear baseline film...    HBP> on Aten50;  BP= 140/90 & she denies HA, CP, palpit, SOB, edema...    VenInsuffic> on low sodium, elevation, support hose as needed...    Chol> she stopped Simva40 on her own &  started supplement rec by her niece- Protandim; FLP shows TChol 204, TG 94, HDL 54, LDL 123 despite a 56# wt loss on diet; she will decide whether to continue the Prodandim or go back on the statin...    Obesity> weight is down 10# on diet to 193# with her best efforts at diet, exercise, etc...    GI> GERD, Hems> on Prilosec OTC as needed & recent hem prob treated w/ AnusolHC cream; colonoscopy 8/13 by [name] showed divertics & sm int hems; f/u 39yrs...    DJD/ FM/ LBP> uses rest, heat, Advil, etc...    Anxiety/ Depression> on Xanax 0.5mg  prn... We reviewed prob list, meds, xrays and labs> see below for updates >>  LABS 2/14:  FLP- ok x LDL=123 off the Simva40;  LFTs- wnl...   ~  March 24, 2013:  43mo ROV & time for her CPX> Amy Williamson has lost 20# further down to 173#- great job!!!  She has Ortho surg sched for "trigger finger" later this month;      Hx Pneumonia/ LLL granuloma> prev pneumonia from 2012 & known LLL granuloma; she has NEB w/ Albut for prn use, no recent breathing problems; CXR last 11/12 was  clear baseline film...    HBP> on Aten50;  BP= 138/78 & she denies HA, CP, palpit, SOB, edema; notes mod bradycardia & we decided to decr Aten50 to 1/2 tab & add Losar50...    VenInsuffic> on low sodium, elevation, support hose as needed...    Chol> she stopped Simva40 on her own & started supplement rec by her niece- Protandim; FLP shows TChol 210, TG 128, HDL 74, LDL 142 despite an additional 20# wt loss; Rec to try Simva20 & recheck labs in 42mo...    Obesity> weight is down 20# more on her diet to 173# with her best efforts at diet, exercise, etc...    GI> GERD, Hems> on Prilosec OTC as needed & recent hem prob treated w/ AnusolHC cream; colonoscopy 8/13 by [name] showed divertics & sm int hems; f/u 61yrs...    DJD/ FM/ LBP> she had Bilat CTS in the past & sched for trigger finger surg per DrGramig later this month... uses rest, heat, Advil, etc...    Anxiety/ Depression> on Xanax 0.5mg   prn... We reviewed prob list, meds, xrays and labs> see below for updates >>  CXR 9/14 showed borderline heart size, clear lungs, no acute changes, DJD in sp & sl scoliosis EKG 9/14 showed SBrady, rate50, WNL, NAD... LABS 8/14:  FLP- still not at goals on diet w/ 20# wt loss LDL=142;  Chems- wnl;  CBC- wnl;  TSH=2.35...            PROBLEM LIST:   ABNORMAL CHEST XRAY (ICD-793.1) - she has a <1cm calcif nodule seen in LLL- likely granuloma, without serial change; She has a Nebulizer w/ Albut for prn use. ~  CXR 6/09 showed no change... ~  CXR 3/10 showed borderline Cor size, NAD.[name]. ~  CXR 8/11 showed borderline cor, ectatic Ao, clear lungs, mild DJD spine. ~  CXR 8/12 showed borderline cor, clear lungs, NAD... ~  10/12:  She developed cough, sput w/ streaky hemoptysis, SOB, etc; went to [facility] w/ CXR showing right pneumonia; treated w/ IM Rocephin & po Avelox; f/u here & slowly improved w/ CXR 05/29/11 clear & back to baseline... ~  CXR 9/14 showed borderline heart size, clear lungs, no acute changes, DJD in sp & sl scoliosis  HYPERTENSION (ICD-401.9) & Hx of PALPITATIONS (ICD-785.1) >>  ~  on ASA 81mg /d + ATENOLOL 50mg /d...  ~  8/12:  BP= 130/70 and asymptomatic... denies HA, fatigue, visual changes, CP, palipit, dizziness, syncope, dyspnea, edema, etc... ~  11/12:  BP= 146/86 & she is improving clinically from her recent bout of pneumonia. ~  8/13:  BP= 128/82 & she denies HA, CP, palpit, SOB, edema... EKG showed SBrady, rate50, wnl, NAD... ~  2/14:  on Aten50;  BP= 140/90 & she denies HA, CP, palpit, SOB, edema...  ~  9/14:  on Aten50;  BP= 138/78 & she denies HA, CP, palpit, SOB, edema; notes mod bradycardia & we decided to decr Aten50 to 1/2 tab & add Losar50  VENOUS INSUFFICIENCY (ICD-459.81) - she has mod VI changes and trace edema... she knows to elim sodium, elevate, wear TED hose, etc...  HYPERCHOLESTEROLEMIA (ICD-272.0) >>  ~  on SIMVASTATIN 40mg /d... ~  FLP 6/08 showed TChol 138,  Tg 75, HDL 49, LDL 74... continue same. ~  FLP 6/09 showed TChol 143, TG 98, HDL 55, LDL 68 ~  FLP 7/10 showed TChol 150, TG 104, HDL 56, LDL 73 ~  FLP 8/11 on Simva40 showed TChol 160, TG 119, HDL 56,  LDL 80 ~  FLP 8/12 on Simva40 showed TChol 140, TG 76, HDL 59, LDL 66 ~  FLP 8/13 on Simva40 showed TChol 160, TG 127, HDL 57, LDL 77  ~  FLP 2/14 off Simva on Protandim showed TChol 204, TG 94, HDL 54, LDL 123... She will decide what to take. ~  FLP 9/14 on supplement showed TChol 210, TG 128, HDL 74, LDL 142... She agreed to try Simva20...  OBESITY (ICD-278.00) - she is 5\' 1"  tall and her weights of 190 -220 w/ BMI 36 - 42 ~  weight 6/08 = 163# ~  weight 7/09 = 188# ~  weight 3/10 = 217# ~  weight 8/11 = 211# ~  Weight 8/12 = 200# ~  Weight 8/13 = 203# ~  Weight 2/14 = 193# ~  Weight 9/14 = 173#  GASTROESOPHAGEAL REFLUX DISEASE (ICD-530.81) - uses OTC Prilosec as needed & ANUSOL HC cream prn... ~  routine colonoscopy 9/03 by [name] was WNL... f/u planned 25yrs. ~  8/13:  She had recent BRB w/ Hem's suspected & treated w/ AnusolHC cream; due for f/u colon=> done 8/13 by [name] & showed mild divertics in sigmoid, & sm int hems believed to be the source of low vol rectal bleeding & rec to get on hi fiberr diet... F/u planned 35yrs.  DEGENERATIVE JOINT DISEASE >> FIBROMYALGIA (ICD-729.1) - symptoms suggesting FM in the past... ortho eval from [name]> right 4th trigger finger surg 6/12. ~  1/13: she had left knee surg by [name] w/ partial medial & lateral meniscectomies & shaving of the femoral condyles, plus excision of large Baker's cyst from left knee... ~  9/14: she had Bilat CTS in the past & sched for trigger finger surg per DrGramig later this month...   MIGRAINES, HX OF (ICD-V13.8) - she has hx of migraines and notes some HA when lying back & occiput touches pillow at night... headaches have improved over time, using TYLENOL Prn...  ANXIETY & DEPRESSION (ICD-311) -  under mod stress caring for husb w/ end-stage MS... ~  7/10: notes that Hospice signed off & now getting "care management" w/ [facility]- 10H per week help at home... ALPRAZOLAM 0.5mg  Prn.  Health Maintenance - she had PNEUMOVAX 2006, TETANUS shot 2006, & yearly Flu shots... GYN= DrMiller for PAP, [facility], and BMD 2013 reported normal per pt... she takes Calcium, MVI, Vit D supplement...   Past Surgical History  Procedure Laterality Date  . Pulley release right trigger finger  01-08-2011  . Carpal tunnel release  2004    RIGHT  . Carpal tunnel release  2006    LEFT  . Ear cyst excision  08/03/2011    Procedure: CYST REMOVAL;  Surgeon: Illene Labrador Aplington;  Location: [facility];  Service: Orthopedics;  Laterality: N/A;  EXCISION OF POPITEAL CYST   . Knee surgery      lt  . Leg cyst removal       Outpatient Encounter Prescriptions as of 03/24/2013  Medication Sig Dispense Refill  . albuterol (PROVENTIL) (2.5 MG/3ML) 0.083% nebulizer solution Take by nebulization as needed. 1 vial 2-3 times a day      . ALPRAZolam (XANAX) 0.5 MG tablet Take 1 tablet (0.5 mg total) by mouth as needed. Take 1/2 to 1 tablet three times a day as needed.  90 tablet  5  . aspirin 81 MG tablet Take 81 mg by mouth daily.       [name]  atenolol (TENORMIN) 50 MG tablet Take 1 tablet (50 mg total) by mouth every morning.  31 tablet  11  . calcium carbonate (OS-CAL) 600 MG TABS tablet Take 600 mg by mouth daily with breakfast.      . cholecalciferol (VITAMIN D) 1000 UNITS tablet Take 1,000 Units by mouth daily.      [name] ibuprofen (ADVIL,MOTRIN) 200 MG tablet Take 200 mg by mouth every 6 (six) hours as needed.      . Multiple Vitamin (MULTIVITAMIN) tablet Take 1 tablet by mouth daily.       . Multiple Vitamins-Minerals (ANTIOXIDANT FORMULA) CAPS Take 1 capsule by mouth daily.       No facility-administered encounter medications on file as of 03/24/2013.    Allergies   Allergen Reactions  . Avelox [Moxifloxacin Hcl In Nacl]     Pt did not like side effects    Current Medications, Allergies, Past Medical History, Past Surgical History, Family History, and Social History were reviewed in [employer] record.    Review of Systems         The patient complains of sl cough, fatigue, dyspnea on exertion, and depression.  The patient denies fever, chills, sweats, anorexia, weakness, malaise, weight loss, sleep disorder, blurring, diplopia, eye irritation, eye discharge, vision loss, eye pain, photophobia, earache, ear discharge, tinnitus, decreased hearing, nasal congestion, nosebleeds, sore throat, hoarseness, chest pain, palpitations, syncope, orthopnea, PND, peripheral edema, dyspnea at rest, excessive sputum, hemoptysis, wheezing, pleurisy, nausea, vomiting, diarrhea, constipation, change in bowel habits, abdominal pain, melena, hematochezia, jaundice, gas/bloating, indigestion/heartburn, dysphagia, odynophagia, dysuria, hematuria, urinary frequency, urinary hesitancy, nocturia, incontinence, back pain, joint pain, joint swelling, muscle cramps, muscle weakness, stiffness, arthritis, sciatica, restless legs, leg pain at night, leg pain with exertion, rash, itching, dryness, suspicious lesions, paralysis, paresthesias, seizures, tremors, vertigo, transient blindness, frequent falls, frequent headaches, difficulty walking, anxiety, memory loss, confusion, cold intolerance, heat intolerance, polydipsia, polyphagia, polyuria, unusual weight change, abnormal bruising, bleeding, enlarged lymph nodes, urticaria, allergic rash, hay fever, and recurrent infections.     Objective:   Physical Exam    WD, WN, 62 y/o WF in NAD... GENERAL:  Alert & oriented; pleasant & cooperative... HEENT:  /AT, EOM-wnl, PERRLA, EACs-clear, TMs-wnl, NOSE-clear, THROAT-clear & wnl. NECK:  Supple w/ full ROM; no JVD; normal carotid impulses w/o bruits; no thyromegaly or  nodules palpated; no lymphadenopathy. CHEST:  Clear to P & A; without wheezes/ rales/ or rhonchi. HEART:  Regular Rhythm; without murmurs/ rubs/ or gallops. ABDOMEN:  Soft & nontender; normal bowel sounds; no organomegaly or masses detected. EXT: without deformities, mild arthritic changes; no varicose veins/ venous insuffic/ or edema. NEURO:  CN's intact; motor testing normal; sensory testing normal; gait normal & balance OK. DERM:  No lesions noted; no rash etc...  RADIOLOGY DATA:  Reviewed in the EPIC EMR & discussed w/ the patient...  LABORATORY DATA:  Reviewed in the EPIC EMR & discussed w/ the patient...   Assessment & Plan:    s/p PNEUMONIA>  resolved after antibiotic Rx; improved clinically w/ rest, fluids, tylenol, mucinex, etc ==> back to baseline & no residual problems encountered...  Hx of Abnormal CXR> <1cm LLL calcif nodule= likely granuloma, no serial changes...  HBP>  Remains well controlled on Atenolol 50mg /d & wt reduction but she has bradtcardia & wants to decr Aten50-1/2 daIly, add LOSARTAN50/d & watch BP...  Ven Insuffic>  Be sure to avoid sodium, elev legs, wera support hose as discussed..  CHOL>  AS ABOVE-  LDL up to 142 on Protandim; rec to restart low dose Statin eg- Simva20...  Obesity>  Weight is down to 173#  & clinically improved...  GERD>  She uses Prilosec OTC as needed... S/p colonoscopy 8/13 w/ divertics, int hems...  DJD/ FM>  She manages well & advised Yoga, stretching, etc; she had left knee surg by [name] 1/13...  Anxiety & Depression>  She has Alpraz for prn use...   Patient's Medications  New Prescriptions   No medications on file  Previous Medications   ASPIRIN 81 MG TABLET    Take 81 mg by mouth daily.    CALCIUM CARBONATE (OS-CAL) 600 MG TABS TABLET    Take 600 mg by mouth daily with breakfast.   CHOLECALCIFEROL (VITAMIN D) 1000 UNITS TABLET    Take 1,000 Units by mouth daily.   IBUPROFEN (ADVIL,MOTRIN) 200 MG TABLET    Take 200  mg by mouth every 6 (six) hours as needed.   MULTIPLE VITAMIN (MULTIVITAMIN) TABLET    Take 1 tablet by mouth daily.    MULTIPLE VITAMINS-MINERALS (ANTIOXIDANT FORMULA) CAPS    Take 1 capsule by mouth daily.  Modified Medications   Modified Medication Previous Medication   ALBUTEROL (PROVENTIL) (2.5 MG/3ML) 0.083% NEBULIZER SOLUTION albuterol (PROVENTIL) (2.5 MG/3ML) 0.083% nebulizer solution      Take 3 mLs (2.5 mg total) by nebulization as needed. 1 vial 2-3 times a day    Take by nebulization as needed. 1 vial 2-3 times a day   ALPRAZOLAM (XANAX) 0.5 MG TABLET ALPRAZolam (XANAX) 0.5 MG tablet      Take 1 tablet (0.5 mg total) by mouth as needed. Take 1/2 to 1 tablet three times a day as needed.    Take 1 tablet (0.5 mg total) by mouth as needed. Take 1/2 to 1 tablet three times a day as needed.   ATENOLOL (TENORMIN) 50 MG TABLET atenolol (TENORMIN) 50 MG tablet      Take 1/2 tablet by mouth daily    Take 1 tablet (50 mg total) by mouth every morning.   LOSARTAN (COZAAR) 50 MG TABLET losartan (COZAAR) 50 MG tablet      Take 1 tablet (50 mg total) by mouth daily.    Take 50 mg by mouth daily.   SIMVASTATIN (ZOCOR) 20 MG TABLET simvastatin (ZOCOR) 20 MG tablet      Take 1 tablet (20 mg total) by mouth every evening.    Take 20 mg by mouth every evening.  Discontinued Medications   ATENOLOL (TENORMIN) 50 MG TABLET    Take 1/2 tablet by mouth daily

## 2013-03-24 NOTE — Patient Instructions (Addendum)
Today we updated your med list in our EPIC system...    For your BP>     We decided to cut the ATENOLOL 50mg  in half & take just 25mg  daily...     And add a low dose of LOSARTAN 50mg - one tab daily...     Watch your BP at home & call for questions...  For your Cholesterol>     We decided to restart a lower dose of your SIMVASTATIN just 20mg  once daily at bedtime...  Keep up the great job w/ diet & exercise!!!    We will use YOU as the "GOOD EXAMPLE" to our other patients!!!  Good luck w/ the trigger finger surg...  Let's plan a follow up visit in 62mo, sooner if needed for problems.Marland KitchenMarland Kitchen

## 2013-03-24 NOTE — Telephone Encounter (Signed)
I spoke with pt and is aware rx has been sent. Nothing further needed

## 2013-03-27 ENCOUNTER — Telehealth: Payer: Self-pay | Admitting: Pulmonary Disease

## 2013-03-27 NOTE — Telephone Encounter (Signed)
I spoke with patient about results and she verbalized understanding and had no questions 

## 2013-04-08 ENCOUNTER — Encounter: Payer: Self-pay | Admitting: Obstetrics & Gynecology

## 2013-04-20 HISTORY — PX: TRIGGER FINGER RELEASE: SHX641

## 2013-04-29 ENCOUNTER — Ambulatory Visit: Payer: Self-pay | Admitting: Obstetrics & Gynecology

## 2013-04-30 ENCOUNTER — Encounter: Payer: Self-pay | Admitting: Obstetrics & Gynecology

## 2013-05-01 ENCOUNTER — Ambulatory Visit (INDEPENDENT_AMBULATORY_CARE_PROVIDER_SITE_OTHER): Payer: 59 | Admitting: Obstetrics & Gynecology

## 2013-05-01 ENCOUNTER — Encounter: Payer: Self-pay | Admitting: Obstetrics & Gynecology

## 2013-05-01 VITALS — BP 130/64 | HR 56 | Resp 16 | Ht 60.25 in | Wt 167.0 lb

## 2013-05-01 DIAGNOSIS — Z124 Encounter for screening for malignant neoplasm of cervix: Secondary | ICD-10-CM

## 2013-05-01 DIAGNOSIS — Z01419 Encounter for gynecological examination (general) (routine) without abnormal findings: Secondary | ICD-10-CM

## 2013-05-01 NOTE — Patient Instructions (Signed)

## 2013-05-01 NOTE — Progress Notes (Signed)
62 y.o. G2P2 WidowedCaucasianF here for annual exam.  Doing well.  Newly a great-grandmother of twin girls.  [place] live in [place].  No vaginal bleeding.  Had trigger finger surgery in September.  Going to weight watchers and really doing well.  Has overall lost 52 pounds.    Patient's last menstrual period was 07/23/2004.          Sexually active: no  The current method of family planning is none.    Exercising: yes  walking and weight watchers Smoker:  no  Health Maintenance: Pap:  01/11/11 WNL History of abnormal Pap:  no MMG:  11/18/12 normal Colonoscopy:  2013 repeat in 10 years, Dr. Juanda Chance BMD:   4/10, 1.2/0.2 TDaP:  12/2004 Screening Labs: PCP, Hb today: PCP, Urine today: PCP   reports that she has never smoked. She has never used smokeless tobacco. She reports that she does not drink alcohol or use illicit drugs.  Past Medical History  Diagnosis Date  . Unspecified essential hypertension   . Palpitations   . Unspecified venous (peripheral) insufficiency   . Pure hypercholesterolemia   . Obesity, unspecified   . Myalgia and myositis, unspecified   . Depressive disorder, not elsewhere classified   . History of pneumonia 05-14-11  . Bronchitis 07-14-11  . History of esophageal reflux   . History of migraine headaches   . Arthritis     BACK AND SHOULDERS  . Cataract immature BILATERAL  . Tendonitis   . Slow pulse     Dr Kriste Basque following  . Hemorrhoid     Past Surgical History  Procedure Laterality Date  . Pulley release right trigger finger  01-08-2011  . Carpal tunnel release  2004    RIGHT  . Carpal tunnel release  2006    LEFT  . Ear cyst excision  08/03/2011    Procedure: CYST REMOVAL;  Surgeon: Illene Labrador Aplington;  Location: [facility];  Service: Orthopedics;  Laterality: N/A;  EXCISION OF POPITEAL CYST   . Knee surgery      lt  . Leg cyst removal    . Tubal ligation    . Cesarean section    . Trigger finger release  04/20/13    right middle  finger    Current Outpatient Prescriptions  Medication Sig Dispense Refill  . aspirin 81 MG tablet Take 81 mg by mouth daily.       [name] atenolol (TENORMIN) 50 MG tablet Take 1/2 tablet by mouth daily  15 tablet  6  . calcium carbonate (OS-CAL) 600 MG TABS tablet Take 600 mg by mouth daily with breakfast.      . cholecalciferol (VITAMIN D) 1000 UNITS tablet Take 1,000 Units by mouth daily.      [name] losartan (COZAAR) 50 MG tablet Take 1 tablet (50 mg total) by mouth daily.  30 tablet  6  . Multiple Vitamin (MULTIVITAMIN) tablet Take 1 tablet by mouth daily.       . Multiple Vitamins-Minerals (ANTIOXIDANT FORMULA) CAPS Take 1 capsule by mouth daily.      . simvastatin (ZOCOR) 20 MG tablet Take 1 tablet (20 mg total) by mouth every evening.  30 tablet  6  . albuterol (PROVENTIL) (2.5 MG/3ML) 0.083% nebulizer solution Take 3 mLs (2.5 mg total) by nebulization as needed. 1 vial 2-3 times a day  75 mL  6  . ALPRAZolam (XANAX) 0.5 MG tablet Take 1 tablet (0.5 mg total) by mouth as needed. Take 1/2  to 1 tablet three times a day as needed.  90 tablet  5  . ibuprofen (ADVIL,MOTRIN) 200 MG tablet Take 200 mg by mouth every 6 (six) hours as needed.       No current facility-administered medications for this visit.    Family History  Problem Relation Age of Onset  . Heart failure Mother   . Liver cancer Brother   . Heart disease Father   . Clotting disorder Father   . Liver disease Maternal Aunt     x2  . Osteoporosis Mother   . Hyperlipidemia Mother   . Hypertension Mother     ROS:  Pertinent items are noted in HPI.  Otherwise, a comprehensive ROS was negative.  Exam:   BP 130/64  Pulse 56  Resp 16  Ht 5' 0.25" (1.53 m)  Wt 167 lb (75.751 kg)  BMI 32.36 kg/m2  LMP 07/23/2004  Weight change: -37lb   Height: 5' 0.25" (153 cm)  Ht Readings from Last 3 Encounters:  05/01/13 5' 0.25" (1.53 m)  03/24/13 5' 0.5" (1.537 m)  09/03/12 5' 0.5" (1.537 m)    General appearance: alert, cooperative  and appears stated age Head: Normocephalic, without obvious abnormality, atraumatic Neck: no adenopathy, supple, symmetrical, trachea midline and thyroid normal to inspection and palpation Lungs: clear to auscultation bilaterally Breasts: normal appearance, no masses or tenderness Heart: regular rate and rhythm Abdomen: soft, non-tender; bowel sounds normal; no masses,  no organomegaly Extremities: extremities normal, atraumatic, no cyanosis or edema Skin: Skin color, texture, turgor normal. No rashes or lesions Lymph nodes: Cervical, supraclavicular, and axillary nodes normal. No abnormal inguinal nodes palpated Neurologic: Grossly normal   Pelvic: External genitalia:  no lesions              Urethra:  normal appearing urethra with no masses, tenderness or lesions              Bartholins and Skenes: normal                 Vagina: normal appearing vagina with normal color and discharge, no lesions              Cervix: no lesions              Pap taken: yes Bimanual Exam:  Uterus:  normal size, contour, position, consistency, mobility, non-tender              Adnexa: normal adnexa and no mass, fullness, tenderness               Rectovaginal: Confirms               Anus:  normal sphincter tone, no lesions  A:  Well Woman with normal exam SUI Hypertension  P:   Mammogram yearly. pap smear only today. return annually or prn  An After Visit Summary was printed and given to the patient.

## 2013-05-05 LAB — IPS PAP SMEAR ONLY

## 2013-05-25 ENCOUNTER — Telehealth: Payer: Self-pay | Admitting: Pulmonary Disease

## 2013-05-25 MED ORDER — AMLODIPINE BESYLATE 5 MG PO TABS: 5.0000 mg | ORAL_TABLET | Freq: Every day | ORAL | Status: DC

## 2013-05-25 NOTE — Telephone Encounter (Signed)
From OV 03/24/13: For your BP>      We decided to cut the ATENOLOL 50mg  in half & take just 25mg  daily...     And add a low dose of LOSARTAN 50mg - one tab daily... ---  Pt reports Friday night her BP was 139/50 HR 47. Last week her HR was in the low 50's so she took the atenolol 25 mg 1/4 of this tablet and still was having low HR. She notices the HR is more up in the mornings and then throughout the day is when is lower and at night. Please advise SN thanks  Allergies  Allergen Reactions  . Avelox [Moxifloxacin Hcl In Nacl]     Pt did not like side effects

## 2013-05-25 NOTE — Telephone Encounter (Signed)
Per SN---  Lets change the BP meds---  Stop the atenolol and change to amlodipine 5 mg  Daily.  thanks

## 2013-05-25 NOTE — Telephone Encounter (Signed)
I spoke with pt and is aware of recs. RX has been sent 

## 2013-05-28 ENCOUNTER — Other Ambulatory Visit: Payer: Self-pay

## 2013-07-23 DIAGNOSIS — Z8719 Personal history of other diseases of the digestive system: Secondary | ICD-10-CM

## 2013-07-23 DIAGNOSIS — Z87898 Personal history of other specified conditions: Secondary | ICD-10-CM

## 2013-07-23 HISTORY — DX: Personal history of other specified conditions: Z87.898

## 2013-07-23 HISTORY — DX: Personal history of other diseases of the digestive system: Z87.19

## 2013-08-23 HISTORY — PX: ESOPHAGOGASTRODUODENOSCOPY (EGD) WITH ESOPHAGEAL DILATION: SHX5812

## 2013-09-08 ENCOUNTER — Encounter (HOSPITAL_COMMUNITY): Payer: Self-pay | Admitting: Emergency Medicine

## 2013-09-08 ENCOUNTER — Telehealth: Payer: Self-pay | Admitting: Gastroenterology

## 2013-09-08 ENCOUNTER — Emergency Department (HOSPITAL_COMMUNITY)
Admission: EM | Admit: 2013-09-08 | Discharge: 2013-09-08 | Disposition: A | Discharge status: Home / Self Care | Payer: 59 | Attending: Emergency Medicine | Admitting: Emergency Medicine

## 2013-09-08 ENCOUNTER — Emergency Department (HOSPITAL_COMMUNITY): Payer: 59

## 2013-09-08 DIAGNOSIS — Z79899 Other long term (current) drug therapy: Secondary | ICD-10-CM | POA: Insufficient documentation

## 2013-09-08 DIAGNOSIS — F329 Major depressive disorder, single episode, unspecified: Secondary | ICD-10-CM | POA: Insufficient documentation

## 2013-09-08 DIAGNOSIS — M19019 Primary osteoarthritis, unspecified shoulder: Secondary | ICD-10-CM | POA: Insufficient documentation

## 2013-09-08 DIAGNOSIS — D72829 Elevated white blood cell count, unspecified: Secondary | ICD-10-CM | POA: Insufficient documentation

## 2013-09-08 DIAGNOSIS — E669 Obesity, unspecified: Secondary | ICD-10-CM | POA: Insufficient documentation

## 2013-09-08 DIAGNOSIS — F3289 Other specified depressive episodes: Secondary | ICD-10-CM | POA: Insufficient documentation

## 2013-09-08 DIAGNOSIS — Z8701 Personal history of pneumonia (recurrent): Secondary | ICD-10-CM | POA: Insufficient documentation

## 2013-09-08 DIAGNOSIS — G43909 Migraine, unspecified, not intractable, without status migrainosus: Secondary | ICD-10-CM | POA: Insufficient documentation

## 2013-09-08 DIAGNOSIS — E7800 Pure hypercholesterolemia, unspecified: Secondary | ICD-10-CM | POA: Insufficient documentation

## 2013-09-08 DIAGNOSIS — I10 Essential (primary) hypertension: Secondary | ICD-10-CM | POA: Insufficient documentation

## 2013-09-08 DIAGNOSIS — R1310 Dysphagia, unspecified: Secondary | ICD-10-CM

## 2013-09-08 DIAGNOSIS — K219 Gastro-esophageal reflux disease without esophagitis: Secondary | ICD-10-CM | POA: Insufficient documentation

## 2013-09-08 DIAGNOSIS — Z7982 Long term (current) use of aspirin: Secondary | ICD-10-CM | POA: Insufficient documentation

## 2013-09-08 DIAGNOSIS — M129 Arthropathy, unspecified: Secondary | ICD-10-CM | POA: Insufficient documentation

## 2013-09-08 DIAGNOSIS — R079 Chest pain, unspecified: Secondary | ICD-10-CM | POA: Insufficient documentation

## 2013-09-08 LAB — CBC WITH DIFFERENTIAL/PLATELET
BASOS PCT: 0 % (ref 0–1)
Basophils Absolute: 0 10*3/uL (ref 0.0–0.1)
EOS ABS: 0.2 10*3/uL (ref 0.0–0.7)
Eosinophils Relative: 2 % (ref 0–5)
HCT: 39.2 % (ref 36.0–46.0)
HEMOGLOBIN: 13.2 g/dL (ref 12.0–15.0)
Lymphocytes Relative: 29 % (ref 12–46)
Lymphs Abs: 3.8 10*3/uL (ref 0.7–4.0)
MCH: 29.6 pg (ref 26.0–34.0)
MCHC: 33.7 g/dL (ref 30.0–36.0)
MCV: 87.9 fL (ref 78.0–100.0)
MONOS PCT: 5 % (ref 3–12)
Monocytes Absolute: 0.7 10*3/uL (ref 0.1–1.0)
NEUTROS PCT: 63 % (ref 43–77)
Neutro Abs: 8.1 10*3/uL — ABNORMAL HIGH (ref 1.7–7.7)
Platelets: 239 10*3/uL (ref 150–400)
RBC: 4.46 MIL/uL (ref 3.87–5.11)
RDW: 12.8 % (ref 11.5–15.5)
WBC: 11.8 10*3/uL — ABNORMAL HIGH (ref 4.0–10.5)

## 2013-09-08 LAB — URINALYSIS, ROUTINE W REFLEX MICROSCOPIC
BILIRUBIN URINE: NEGATIVE
Glucose, UA: NEGATIVE mg/dL
HGB URINE DIPSTICK: NEGATIVE
KETONES UR: NEGATIVE mg/dL
NITRITE: NEGATIVE
PROTEIN: NEGATIVE mg/dL
Specific Gravity, Urine: 1.009 (ref 1.005–1.030)
UROBILINOGEN UA: 0.2 mg/dL (ref 0.0–1.0)
pH: 6 (ref 5.0–8.0)

## 2013-09-08 LAB — COMPREHENSIVE METABOLIC PANEL
ALBUMIN: 4.3 g/dL (ref 3.5–5.2)
ALT: 23 U/L (ref 0–35)
AST: 17 U/L (ref 0–37)
Alkaline Phosphatase: 79 U/L (ref 39–117)
BUN: 21 mg/dL (ref 6–23)
CALCIUM: 10.1 mg/dL (ref 8.4–10.5)
CO2: 30 mEq/L (ref 19–32)
CREATININE: 0.84 mg/dL (ref 0.50–1.10)
Chloride: 99 mEq/L (ref 96–112)
GFR calc non Af Amer: 72 mL/min — ABNORMAL LOW (ref >90)
GFR, EST AFRICAN AMERICAN: 84 mL/min — AB (ref >90)
Glucose, Bld: 101 mg/dL — ABNORMAL HIGH (ref 70–99)
POTASSIUM: 4.4 meq/L (ref 3.7–5.3)
Sodium: 141 mEq/L (ref 137–147)
TOTAL PROTEIN: 7.3 g/dL (ref 6.0–8.3)
Total Bilirubin: 0.3 mg/dL (ref 0.3–1.2)

## 2013-09-08 LAB — LIPASE, BLOOD: Lipase: 36 U/L (ref 11–59)

## 2013-09-08 LAB — URINE MICROSCOPIC-ADD ON

## 2013-09-08 LAB — POCT I-STAT TROPONIN I: TROPONIN I, POC: 0.01 ng/mL (ref 0.00–0.08)

## 2013-09-08 NOTE — Discharge Instructions (Signed)
Liquid and pureed diet tonight only. No solid foods. Call Dr. Ricky Stabs office tomorrow morning first thing, followup tomorrow. Return if symptoms are worsening   Dysphagia Swallowing problems (dysphagia) occur when solids and liquids seem to stick in your throat on the way down to your stomach, or the food takes longer to get to the stomach. Other symptoms include regurgitating food, noises coming from the throat, chest discomfort with swallowing, and a feeling of fullness or the feeling of something being stuck in your throat when swallowing. When blockage in your throat is complete it may be associated with drooling. CAUSES  Problems with swallowing may occur because of problems with the muscles. The food cannot be propelled in the usual manner into your stomach. You may have ulcers, scar tissue, or inflammation in the tube down which food travels from your mouth to your stomach (esophagus), which blocks food from passing normally into the stomach. Causes of inflammation include:  Acid reflux from your stomach into your esophagus.  Infection.  Radiation treatment for cancer.  Medicines taken without enough fluids to wash them down into your stomach. You may have nerve problems that prevent signals from being sent to the muscles of your esophagus to contract and move your food down to your stomach. Globus pharyngeus is a relatively common problem in which there is a sense of an obstruction or difficulty in swallowing, without any physical abnormalities of the swallowing passages being found. This problem usually improves over time with reassurance and testing to rule out other causes. DIAGNOSIS Dysphagia can be diagnosed and its cause can be determined by tests in which you swallow a white substance that helps illuminate the inside of your throat (contrast medium) while X-rays are taken. Sometimes a flexible telescope that is inserted down your throat (endoscopy) to look at your esophagus and  stomach is used. TREATMENT   If the dysphagia is caused by acid reflux or infection, medicines may be used.  If the dysphagia is caused by problems with your swallowing muscles, swallowing therapy may be used to help you strengthen your swallowing muscles.  If the dysphagia is caused by a blockage or mass, procedures to remove the blockage may be done. HOME CARE INSTRUCTIONS  Try to eat soft food that is easier to swallow and check your weight on a daily basis to be sure that it is not decreasing.  Be sure to drink liquids when sitting upright (not lying down). SEEK MEDICAL CARE IF:  You are losing weight because you are unable to swallow.  You are coughing when you drink liquids (aspiration).  You are coughing up partially digested food. SEEK IMMEDIATE MEDICAL CARE IF:  You are unable to swallow your own saliva .  You are having shortness of breath or a fever, or both.  You have a hoarse voice along with difficulty swallowing. MAKE SURE YOU:  Understand these instructions.  Will watch your condition.  Will get help right away if you are not doing well or get worse. Document Released: 07/06/2000 Document Revised: 03/11/2013 Document Reviewed: 12/26/2012 [facility] Patient Information 2014 [place].

## 2013-09-08 NOTE — Telephone Encounter (Signed)
ED PA called re pt. She has dysphagia for several days and went to ED today with a food impaction that resolved with vomiting. Tolerating liquids in ED without problems. Advised a full liquid diet and daily Prilosec. Needs an office appt this week. May need APP appt.

## 2013-09-08 NOTE — ED Notes (Signed)
Patient transported to X-ray 

## 2013-09-08 NOTE — ED Provider Notes (Signed)
Medical screening examination/treatment/procedure(s) were performed by non-physician practitioner and as supervising physician I was immediately available for consultation/collaboration.  EKG Interpretation    Date/Time:  Tuesday September 08 2013 16:52:03 EST Ventricular Rate:  64 PR Interval:  167 QRS Duration: 89 QT Interval:  383 QTC Calculation: 395 R Axis:   15 Text Interpretation:  Sinus rhythm Consider anterior infarct Baseline wander in lead(s) II III aVF V5 No old tracing to compare Confirmed by [place]  MD, Jmichael Gille ([id number]) on 09/08/2013 5:47:50 PM              Malvin Johns, MD 09/08/13 Curly [place]

## 2013-09-08 NOTE — ED Provider Notes (Signed)
CSN: [id number]     Arrival date & time 09/08/13  1443 History   First MD Initiated Contact with Patient 09/08/13 1615     Chief Complaint  Patient presents with  . Vomiting     (Consider location/radiation/quality/duration/timing/severity/associated sxs/prior Treatment) HPI Amy Williamson is a 63 y.o. female who presents to emergency department complaining of intermittent chest pain after eating. She states the symptoms began 3 days ago. She states that every time she eats any solid food, she has sensation of food being stuck in her chest. She states eventually goes down or she vomited. Patient is pointing to the sternum. She states she is able to drink liquids without any problems. She states the worst episode was this morning when she lunch, states she had [place] burger and strawberries. She states that she developed retrosternal pain, states felt like who was in passing through, states she tried to gag and she ended up vomiting small chunks of free. She states that she felt better after. She denies any prior history of the same. She states she is recovering from bronchitis. She states she just finished taper of prednisone and azithromycin, last does was taken yesterday. She states she's also still feels like her when this is worse from being sick. She denies any shortness of breath with these chest pains. She denies any current symptoms at present. She states that she is followed by Dr. Maurene Capes for colonoscopies, she denies any GI problems otherwise. She has never had an endoscopy. She has history of acid reflux but states that she has not had a problem with that in a long time and does not take any medications.  Past Medical History  Diagnosis Date  . Unspecified essential hypertension   . Palpitations   . Unspecified venous (peripheral) insufficiency   . Pure hypercholesterolemia   . Obesity, unspecified   . Myalgia and myositis, unspecified   . Depressive disorder, not elsewhere  classified   . History of pneumonia 05-14-11  . Bronchitis 07-14-11  . History of esophageal reflux   . History of migraine headaches   . Arthritis     BACK AND SHOULDERS  . Cataract immature BILATERAL  . Tendonitis   . Slow pulse     Dr Lenna Gilford following  . Hemorrhoid    Past Surgical History  Procedure Laterality Date  . Pulley release right trigger finger  01-08-2011  . Carpal tunnel release  2004    RIGHT  . Carpal tunnel release  2006    LEFT  . Ear cyst excision  08/03/2011    Procedure: CYST REMOVAL;  Surgeon: Laurice Record [place];  Location: [facility];  Service: Orthopedics;  Laterality: N/A;  EXCISION OF POPITEAL CYST   . Knee surgery      lt  . Leg cyst removal    . Tubal ligation    . Cesarean section    . Trigger finger release  04/20/13    right middle finger   Family History  Problem Relation Age of Onset  . Heart failure Mother   . Liver cancer Brother   . Heart disease Father   . Clotting disorder Father   . Liver disease Maternal Aunt     x2  . Osteoporosis Mother   . Hyperlipidemia Mother   . Hypertension Mother    History  Substance Use Topics  . Smoking status: Never Smoker   . Smokeless tobacco: Never Used  . Alcohol Use: No   OB History  Grav Para Term Preterm Abortions TAB SAB Ect Mult Living   2 2        2      Review of Systems  Constitutional: Negative for fever and chills.  Respiratory: Negative for cough, chest tightness and shortness of breath.   Cardiovascular: Positive for chest pain. Negative for palpitations and leg swelling.  Gastrointestinal: Positive for nausea and vomiting. Negative for abdominal pain and diarrhea.  Genitourinary: Negative for dysuria, flank pain and pelvic pain.  Musculoskeletal: Negative for arthralgias.  Skin: Negative for rash.  Neurological: Negative for dizziness, weakness and headaches.  All other systems reviewed and are negative.      Allergies  Avelox  Home Medications    Current Outpatient Rx  Name  Route  Sig  Dispense  Refill  . albuterol (PROVENTIL) (2.5 MG/3ML) 0.083% nebulizer solution   Nebulization   Take 3 mLs (2.5 mg total) by nebulization as needed. 1 vial 2-3 times a day   75 mL   6   . ALPRAZolam (XANAX) 0.5 MG tablet   Oral   Take 1 tablet (0.5 mg total) by mouth as needed. Take 1/2 to 1 tablet three times a day as needed.   90 tablet   5   . amLODipine (NORVASC) 5 MG tablet   Oral   Take 1 tablet (5 mg total) by mouth daily.   30 tablet   6   . aspirin 81 MG tablet   Oral   Take 81 mg by mouth daily.          . calcium carbonate (OS-CAL) 600 MG TABS tablet   Oral   Take 600 mg by mouth daily with breakfast.         . cholecalciferol (VITAMIN D) 1000 UNITS tablet   Oral   Take 1,000 Units by mouth daily.         [name] ibuprofen (ADVIL,MOTRIN) 200 MG tablet   Oral   Take 200 mg by mouth every 6 (six) hours as needed.         [name] losartan (COZAAR) 50 MG tablet   Oral   Take 1 tablet (50 mg total) by mouth daily.   30 tablet   6   . Multiple Vitamin (MULTIVITAMIN) tablet   Oral   Take 1 tablet by mouth daily.          . Multiple Vitamins-Minerals (ANTIOXIDANT FORMULA) CAPS   Oral   Take 1 capsule by mouth daily.         . simvastatin (ZOCOR) 20 MG tablet   Oral   Take 1 tablet (20 mg total) by mouth every evening.   30 tablet   6    BP 149/71  Pulse 88  Temp(Src) 97.7 F (36.5 C) (Oral)  Resp 14  SpO2 99%  LMP 07/23/2004 Physical Exam  Nursing note and vitals reviewed. Constitutional: She appears well-developed and well-nourished. No distress.  HENT:  Head: Normocephalic.  Eyes: Conjunctivae are normal.  Neck: Neck supple.  Cardiovascular: Normal rate, regular rhythm and normal heart sounds.   Pulmonary/Chest: Effort normal and breath sounds normal. No respiratory distress. She has no wheezes. She has no rales.  Abdominal: Soft. Bowel sounds are normal. She exhibits no distension. There is  no tenderness. There is no rebound.  Musculoskeletal: She exhibits no edema.  Neurological: She is alert.  Skin: Skin is warm and dry.  Psychiatric: She has a normal mood and affect. Her behavior is normal.  ED Course  Procedures (including critical care time) Labs Review Labs Reviewed  CBC WITH DIFFERENTIAL - Abnormal; Notable for the following:    WBC 11.8 (*)    Neutro Abs 8.1 (*)    All other components within normal limits  COMPREHENSIVE METABOLIC PANEL - Abnormal; Notable for the following:    Glucose, Bld 101 (*)    GFR calc non Af Amer 72 (*)    GFR calc Af Amer 84 (*)    All other components within normal limits  URINALYSIS, ROUTINE W REFLEX MICROSCOPIC  LIPASE, BLOOD   Imaging Review Dg Chest 2 View  09/08/2013   CLINICAL DATA:  Vomiting.  EXAM: CHEST  2 VIEW  COMPARISON:  March 24, 2013.  FINDINGS: The heart size and mediastinal contours are within normal limits. Both lungs are clear. The visualized skeletal structures are unremarkable.  IMPRESSION: No active cardiopulmonary disease.   Electronically Signed   By: Sabino Dick M.D.   On: 09/08/2013 16:55    EKG Interpretation    Date/Time:  Tuesday September 08 2013 16:52:03 EST Ventricular Rate:  64 PR Interval:  167 QRS Duration: 89 QT Interval:  383 QTC Calculation: 395 R Axis:   15 Text Interpretation:  Sinus rhythm Consider anterior infarct Baseline wander in lead(s) II III aVF V5 No old tracing to compare Confirmed by [place]  MD, MELANIE ([id number]) on 09/08/2013 5:47:50 PM            MDM   Final diagnoses:  Dysphagia  Leukocytosis    5:26 PM Spoke with Dr. Fuller Plan who advised to stay on soft/liquid diet. Call office tomorrow morning at 10, will work her in tomorrow.     Patient with trouble swallowing, feels like food is getting stuck in her esophagus. Just finished medications for bronchitis. Chest x-ray is negative. White blood count is elevated, could be related to recent prednisone course,  however infection or inflammation cannot be ruled out. Patient is able to tolerate liquids in emergency department. She's not vomiting. There is no pain or any symptoms at this time. Her vital signs are stable. I discussed patient Dr. Fuller Plan and they will try to work her in the office tomorrow. Advised to keep her on soft or liquid diet tonight. I discussed plan with patient and she agrees.  Filed Vitals:   09/08/13 1454  BP: 149/71  Pulse: 88  Temp: 97.7 F (36.5 C)  TempSrc: Oral  Resp: 14  SpO2: 99%     Renold Genta, PA-C 09/08/13 1918

## 2013-09-08 NOTE — ED Notes (Signed)
Pt a+ox4, presents with c/o feeling like "my food won't go down" x3 days.  Pt pointing to epigastrium and reports feeling like bits of food are getting stuck there.  Pt reports will vomit up small amounts of food and is able to drink water and make the feeling go away.  Pt reports eating [place] burger today "and it won't go away".  Pt denies pain, SOB, nausea.  Speaking full/clear sentences, rr even/un-lab.  No difficulty clearing secretions or maintaining airway.  Pt reports recent dx bronchitis and just finished abx treatment.  Pt denies other complaints or history of similar.  Skin PWD.  MAEI.

## 2013-09-08 NOTE — Telephone Encounter (Signed)
Amy Williamson, could you, please, call pt to set her up for EGD/dil, direct is OK.

## 2013-09-09 ENCOUNTER — Encounter: Payer: Self-pay | Admitting: Physician Assistant

## 2013-09-09 ENCOUNTER — Ambulatory Visit (INDEPENDENT_AMBULATORY_CARE_PROVIDER_SITE_OTHER): Payer: 59 | Admitting: Physician Assistant

## 2013-09-09 ENCOUNTER — Encounter: Payer: Self-pay | Admitting: Internal Medicine

## 2013-09-09 ENCOUNTER — Ambulatory Visit (AMBULATORY_SURGERY_CENTER): Payer: 59 | Admitting: Internal Medicine

## 2013-09-09 VITALS — BP 120/60 | HR 62 | Ht 60.25 in | Wt 156.6 lb

## 2013-09-09 VITALS — BP 126/62 | HR 64 | Temp 98.8°F | Resp 17 | Ht 60.0 in | Wt 156.0 lb

## 2013-09-09 DIAGNOSIS — T18108A Unspecified foreign body in esophagus causing other injury, initial encounter: Secondary | ICD-10-CM

## 2013-09-09 DIAGNOSIS — R1314 Dysphagia, pharyngoesophageal phase: Secondary | ICD-10-CM

## 2013-09-09 DIAGNOSIS — K219 Gastro-esophageal reflux disease without esophagitis: Secondary | ICD-10-CM

## 2013-09-09 DIAGNOSIS — K2970 Gastritis, unspecified, without bleeding: Secondary | ICD-10-CM

## 2013-09-09 DIAGNOSIS — T18128A Food in esophagus causing other injury, initial encounter: Secondary | ICD-10-CM

## 2013-09-09 DIAGNOSIS — K2960 Other gastritis without bleeding: Secondary | ICD-10-CM

## 2013-09-09 DIAGNOSIS — R490 Dysphonia: Secondary | ICD-10-CM

## 2013-09-09 DIAGNOSIS — K2990 Gastroduodenitis, unspecified, without bleeding: Secondary | ICD-10-CM

## 2013-09-09 MED ORDER — SODIUM CHLORIDE 0.9 % IV SOLN: 500.0000 mL | INTRAVENOUS | Status: DC

## 2013-09-09 MED ORDER — OMEPRAZOLE 20 MG PO CPDR: 20.0000 mg | DELAYED_RELEASE_CAPSULE | Freq: Every day | ORAL | Status: DC

## 2013-09-09 NOTE — Telephone Encounter (Signed)
Spoke with patient and scheduled EGD with dil on 09/16/13 at 11:30 AM. She is seeing Nicoletta Ba, PA today also and will have teaching at that time.

## 2013-09-09 NOTE — Patient Instructions (Signed)
YOU HAD AN ENDOSCOPIC PROCEDURE TODAY AT [facility]: Refer to the procedure report that was given to you for any specific questions about what was found during the examination.  If the procedure report does not answer your questions, please call your gastroenterologist to clarify.  If you requested that your care partner not be given the details of your procedure findings, then the procedure report has been included in a sealed envelope for you to review at your convenience later.  YOU SHOULD EXPECT: Some feelings of bloating in the abdomen. Passage of more gas than usual.  Walking can help get rid of the air that was put into your GI tract during the procedure and reduce the bloating. If you had a lower endoscopy (such as a colonoscopy or flexible sigmoidoscopy) you may notice spotting of blood in your stool or on the toilet paper. If you underwent a bowel prep for your procedure, then you may not have a normal bowel movement for a few days.  DIET: Drink plenty of fluids but you should avoid alcoholic beverages for 24 hours.  Please follow the dilatation diet the rest of the day.  ACTIVITY: Your care partner should take you home directly after the procedure.  You should plan to take it easy, moving slowly for the rest of the day.  You can resume normal activity the day after the procedure however you should NOT DRIVE or use heavy machinery for 24 hours (because of the sedation medicines used during the test).    SYMPTOMS TO REPORT IMMEDIATELY: A gastroenterologist can be reached at any hour.  During normal business hours, 8:30 AM to 5:00 PM Monday through Friday, call [phone].  After hours and on weekends, please call the GI answering service at [phone] who will take a message and have the physician on call contact you.     Following upper endoscopy (EGD)  Vomiting of blood or coffee ground material  New chest pain or pain under the shoulder blades  Painful or  persistently difficult swallowing  New shortness of breath  Fever of 100F or higher  Black, tarry-looking stools  FOLLOW UP: If any biopsies were taken you will be contacted by phone or by letter within the next 1-3 weeks.  Call your gastroenterologist if you have not heard about the biopsies in 3 weeks.  Our staff will call the home number listed on your records the next business day following your procedure to check on you and address any questions or concerns that you may have at that time regarding the information given to you following your procedure. This is a courtesy call and so if there is no answer at the home number and we have not heard from you through the emergency physician on call, we will assume that you have returned to your regular daily activities without incident.  SIGNATURES/CONFIDENTIALITY: You and/or your care partner have signed paperwork which will be entered into your electronic medical record.  These signatures attest to the fact that that the information above on your After Visit Summary has been reviewed and is understood.  Full responsibility of the confidentiality of this discharge information lies with you and/or your care-partner.    Handouts were given to your care partner on a dilatation diet, GERD and gastritis. Await biopsy results. Begin Prilosec 20 mg daily.  20 - 30 minutes prior to breakfast daily. Patient was advised to chew carefully. You may resume your current medications today. Please call if  any questions or concerns.

## 2013-09-09 NOTE — Progress Notes (Signed)
Subjective:    Patient ID: Amy Williamson, female    DOB: [birth year], 63 y.o.   MRN: [id number]  HPI  Amy Williamson is a very nice 63 year old white female known to Dr. Delfin Edis. She has history of diverticulosis, hypertension, hyperlipidemia and obesity. She had undergone colonoscopy in August of 2013 which showed mild left-sided diverticulosis only. She comes in today with a new complaints of dysphagia. She has not had prior endoscopy. She says she started having symptoms just a few days ago with solid food dysphagia and yesterday had a transient food impaction. She says she ate a [place] burger and some strawberries for lunch and felt the food gets lodged in her esophagus. She says she tried to forcefully vomited to bring it back up. She had several episodes of vomiting and brought up several pieces of food. She went to the emergency room for evaluation but by the time she was seen there she says she thinks she cleared the food. She has been sore in her chest and in her esophagus since. She went home last evening and tried E. a soft gravel day but says that seemed to get stuck as well. She has been able to tolerate liquids since but has not tried any further solid food. She says she has noticed hoarseness over the past couple of weeks but had also been sick prior to that with a recurrent bronchitis. She had taken a course of steroids and erythromycin. She has no current complaints of odynophagia, no Donald pain no fever. She has noticed some intermittent heartburn over the past couple of months and also mentions of soreness in her ribs.    Review of Systems  Constitutional: Negative.   HENT: Positive for trouble swallowing and voice change.   Eyes: Negative.   Respiratory: Negative.   Cardiovascular: Negative.   Endocrine: Negative.   Genitourinary: Negative.   Musculoskeletal: Negative.   Allergic/Immunologic: Negative.   Neurological: Negative.   Hematological: Negative.     Psychiatric/Behavioral: Negative.    Outpatient Prescriptions Prior to Visit  Medication Sig Dispense Refill  . albuterol (PROVENTIL) (2.5 MG/3ML) 0.083% nebulizer solution Take 3 mLs (2.5 mg total) by nebulization as needed. 1 vial 2-3 times a day  75 mL  6  . ALPRAZolam (XANAX) 0.5 MG tablet Take 1 tablet (0.5 mg total) by mouth as needed. Take 1/2 to 1 tablet three times a day as needed.  90 tablet  5  . amLODipine (NORVASC) 5 MG tablet Take 1 tablet (5 mg total) by mouth daily.  30 tablet  6  . aspirin 81 MG tablet Take 81 mg by mouth daily.       . calcium carbonate (OS-CAL) 600 MG TABS tablet Take 600 mg by mouth daily with breakfast.      . cholecalciferol (VITAMIN D) 1000 UNITS tablet Take 1,000 Units by mouth daily.      [name] ibuprofen (ADVIL,MOTRIN) 200 MG tablet Take 200 mg by mouth every 6 (six) hours as needed.      [name] losartan (COZAAR) 50 MG tablet Take 1 tablet (50 mg total) by mouth daily.  30 tablet  6  . Multiple Vitamin (MULTIVITAMIN) tablet Take 1 tablet by mouth daily.       . Multiple Vitamins-Minerals (ANTIOXIDANT FORMULA) CAPS Take 1 capsule by mouth daily.      . simvastatin (ZOCOR) 20 MG tablet Take 1 tablet (20 mg total) by mouth every evening.  30 tablet  6   No facility-administered  medications prior to visit.   Allergies  Allergen Reactions  . Avelox [Moxifloxacin Hcl In Nacl]     Pt did not like side effects   Patient Active Problem List   Diagnosis Date Noted  . Palpitations 03/24/2013  . Overweight 03/24/2013  . DJD (degenerative joint disease) 03/24/2013  . Diverticulosis of colon without hemorrhage 09/03/2012  . Hemorrhoids 09/03/2012  . Anxiety and depression 03/03/2012  . Rectal bleeding 02/27/2012  . GERD (gastroesophageal reflux disease) 02/27/2012  . Knee mass 06/19/2011  . Pneumonia 05/18/2011  . VENOUS INSUFFICIENCY 03/04/2010  . ABNORMAL CHEST XRAY 01/23/2008  . HYPERCHOLESTEROLEMIA 01/21/2008  . HYPERTENSION 01/21/2008  . FIBROMYALGIA  10/02/2007  . MIGRAINES, HX OF 10/02/2007   History  Substance Use Topics  . Smoking status: Never Smoker   . Smokeless tobacco: Never Used  . Alcohol Use: No   family history includes Clotting disorder in her father; Heart disease in her father; Heart failure in her mother; Hyperlipidemia in her mother; Hypertension in her mother; Liver cancer in her brother; Liver disease in her maternal aunt; Osteoporosis in her mother. There is no history of Colon cancer.     Objective:   Physical Exam  well-developed white female in no acute distress, pleasant blood pressure 120/60 pulse 62 height 5 foot weight 156. HEENT ;nontraumatic normocephalic EOMI PERRLA sclera anicteric, Supple ;no JVD, Cardiovascular ;regular rate and rhythm with S1-S2 no murmur or gallop, Pulmonary ;clear bilaterally Abdomen; soft nontender nondistended bowel sounds are active there is no palpable mass or hepatosplenomegaly, Rectal ;exam not done, Ext;s no clubbing cyanosis or edema skin warm and dry, Psych ;mood and affect normal and appropriate        Assessment & Plan:  #63  63 year old female with new onset solid food dysphagia and transient food impaction yesterday to 17 2015. Rule out esophageal stricture, rule out neoplasm. #2 diverticulosis #3 hypertension #4 hyperlipidemia #5 obesity  Plan; Schedule for upper endoscopy with probable Savary dilation with Dr. Delfin Edis as soon as possible. Procedure discussed in detail with the patient and she is agreeable to proceed She will remain on a full liquid diet until procedure done Will hold on PPI until post EGD

## 2013-09-09 NOTE — Progress Notes (Signed)
Called to room to assist during endoscopic procedure.  Patient ID and intended procedure confirmed with present staff. Received instructions for my participation in the procedure from the performing physician.  

## 2013-09-09 NOTE — Progress Notes (Signed)
Lidocaine-40mg IV prior to Propofol InductionPropofol given over incremental dosages 

## 2013-09-09 NOTE — Patient Instructions (Signed)
Have nothing by mouth . Please bring your driver with you back to our office as soon as you can today. Dr. Olevia Perches will do your procedure, the Endoscopy.

## 2013-09-09 NOTE — Progress Notes (Signed)
No complaints noted in the recovery room.  Written rx for prilosec 20 mg one daily given to the pt's care partner.  Maw

## 2013-09-09 NOTE — Op Note (Signed)
[place]  [employer]. [place], [address]   ENDOSCOPY PROCEDURE REPORT  PATIENT: Amy, Williamson  MR#: [id number] BIRTHDATE: March 08, 1951 , 62  yrs. old GENDER: Female ENDOSCOPIST: Lafayette Dragon, MD REFERRED BY:  Teressa Lower, M.D. PROCEDURE DATE:  09/09/2013 PROCEDURE:  EGD w/ biopsy and Savary dilation of esophagus ASA CLASS:     Class II INDICATIONS:  Dysphagia.   impactions 48 hours ago.  History of recent upper respiratory infection for which she took erythromycin and prednisone.  No prior history of stricture. MEDICATIONS: MAC sedation, administered by CRNA and propofol (Diprivan) 200mg  IV TOPICAL ANESTHETIC: Cetacaine Spray  DESCRIPTION OF PROCEDURE: After the risks benefits and alternatives of the procedure were thoroughly explained, informed consent was obtained.  The LB [id number] endoscope was introduced through the mouth and advanced to the second portion of the duodenum. Without limitations.  The instrument was slowly withdrawn as the mucosa was fully examined.      Esophagus, esophageal mucosa was normal in the proximal and mid esophagus. Distal esophageal mucosa was slightly edematous but there was no stricture. Z line was regular and there were no erosions of fibrosis. There was no significant hiatal hernia. The GE junction appeared patulous Stomach: Gastric mucosa was erythematous in body and the gastric antrum. Biopsies were obtained to rule out H. pylori. Pyloric outlet was normal. The retroflexion of the endoscope revealed normal fundus and cardia Duodenum: The duodenal bulb and descending duodenum was normal 18 mm Savary dilator passed over the guidewire through the esophagus with minimal resistance. There was no blood on the dilator patient tolerated procedure well[        The scope was then withdrawn from the patient and the procedure completed.  COMPLICATIONS: There were no complications. ENDOSCOPIC IMPRESSION: Recent  food impaction. No evidence for esophageal stricture. Passage of the 18 mm Savary dilator Mild gastritis status post biopsies RECOMMENDATIONS: 1.  Await pathology results 2.  Anti-reflux regimen to be follow 3.  begin Prilosec 20 mg daily 4 impaction was likely caused by you esophageal hypomotility or a large bolus.  Patient was advised to chew carefully.  If dysphagia continues we will obtain barium esophagram to assess the motility  REPEAT EXAM:  eSigned:  Lafayette Dragon, MD 09/09/2013 2:19 PM   CC:  PATIENT NAME:  Amy, Williamson MR#: [id number]

## 2013-09-09 NOTE — Progress Notes (Signed)
Reviewed and agree DB 

## 2013-09-10 ENCOUNTER — Telehealth: Payer: Self-pay | Admitting: *Deleted

## 2013-09-10 ENCOUNTER — Encounter: Payer: Self-pay | Admitting: *Deleted

## 2013-09-10 NOTE — Telephone Encounter (Signed)
  Follow up Call-  Call back number 09/09/2013 03/12/2012  Post procedure Call Back phone  # [phone]  Permission to leave phone message - Yes     Patient questions:  Do you have a fever, pain , or abdominal swelling? no Pain Score  0 *  Have you tolerated food without any problems? no  Have you been able to return to your normal activities? yes  Do you have any questions about your discharge instructions: Diet   no Medications  no Follow up visit  no  Do you have questions or concerns about your Care? no  Actions: * If pain score is 4 or above: No action needed, pain <4.  Pt c/o feeling like something is stuck in esophagus when she ate oatmeal, pt states this is the same feeling she was having before, dr did not find any stricture even though a dilator was passed, advised pt to try some warm water in between bites and to still eat softer foods today. If she continues to have problems to call the office to set up barium esophagram.

## 2013-09-10 NOTE — Telephone Encounter (Signed)
error 

## 2013-09-11 ENCOUNTER — Telehealth: Payer: Self-pay | Admitting: Internal Medicine

## 2013-09-11 DIAGNOSIS — R1319 Other dysphagia: Secondary | ICD-10-CM

## 2013-09-11 NOTE — Telephone Encounter (Signed)
Pt states she is still having that sensation of food getting stuck. States she can drink liquids. Instructed pt to try cream soups, yogart, ensure over the weekend until we could see what Dr. Olevia Perches wanted to do. Please advise.

## 2013-09-11 NOTE — Telephone Encounter (Signed)
Please schedule pt for Barium esophagram to assess esophageal dismotility.

## 2013-09-13 NOTE — Telephone Encounter (Signed)
Pt underwent savory dilitation 4 days ago but still having dysphagia to solids and liquids.  No stricture was seen. She was instructed to try taking liquids today, then NPO after midnight.  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

## 2013-09-14 ENCOUNTER — Encounter: Payer: Self-pay | Admitting: *Deleted

## 2013-09-14 ENCOUNTER — Encounter: Payer: Self-pay | Admitting: Internal Medicine

## 2013-09-14 NOTE — Telephone Encounter (Signed)
Patient given date, time and instructions.

## 2013-09-14 NOTE — Telephone Encounter (Signed)
Spoke with Lars Mage and scheduled patient for barium esophagram on 09/15/13 at 9:00 AM [facility]. NPO 3 hours prior.

## 2013-09-15 ENCOUNTER — Encounter: Payer: Self-pay | Admitting: *Deleted

## 2013-09-15 ENCOUNTER — Ambulatory Visit (HOSPITAL_COMMUNITY)
Admission: RE | Admit: 2013-09-15 | Discharge: 2013-09-15 | Disposition: A | Discharge status: Home / Self Care | Payer: 59 | Source: Ambulatory Visit | Attending: Internal Medicine | Admitting: Internal Medicine

## 2013-09-15 DIAGNOSIS — R1310 Dysphagia, unspecified: Secondary | ICD-10-CM | POA: Insufficient documentation

## 2013-09-15 DIAGNOSIS — K224 Dyskinesia of esophagus: Secondary | ICD-10-CM | POA: Insufficient documentation

## 2013-09-15 DIAGNOSIS — R1319 Other dysphagia: Secondary | ICD-10-CM

## 2013-09-15 DIAGNOSIS — K449 Diaphragmatic hernia without obstruction or gangrene: Secondary | ICD-10-CM | POA: Insufficient documentation

## 2013-09-16 ENCOUNTER — Encounter: Payer: 59 | Admitting: Internal Medicine

## 2013-09-16 ENCOUNTER — Telehealth: Payer: Self-pay | Admitting: Internal Medicine

## 2013-09-16 ENCOUNTER — Other Ambulatory Visit: Payer: Self-pay | Admitting: *Deleted

## 2013-09-16 DIAGNOSIS — K224 Dyskinesia of esophagus: Secondary | ICD-10-CM

## 2013-09-17 NOTE — Telephone Encounter (Signed)
Hello Scoot, Amy Williamson has an appointment to see you on Monday March 2 concerning rather sudden onset of dysphagia following course of Azithromycin. Endoscopy did not show stricture or Candida esophagitis. Esophagram conifrms rather significant dysmotility. She claims that all her symptoms started suddenly. I wonder if she needs to see a neurologist concerning this, to r/o brain stem lesion. I will leave it up to you. We scheduled her for Speech Path consult. I have not treated her with Diflucan but on a second thought it may be tried. Thanx  Yoona Ishii B

## 2013-09-21 ENCOUNTER — Encounter: Payer: Self-pay | Admitting: Pulmonary Disease

## 2013-09-21 ENCOUNTER — Ambulatory Visit (INDEPENDENT_AMBULATORY_CARE_PROVIDER_SITE_OTHER): Payer: 59 | Admitting: Pulmonary Disease

## 2013-09-21 VITALS — BP 132/78 | HR 62 | Temp 97.6°F | Ht 60.5 in | Wt 151.0 lb

## 2013-09-21 DIAGNOSIS — F32A Depression, unspecified: Secondary | ICD-10-CM

## 2013-09-21 DIAGNOSIS — K219 Gastro-esophageal reflux disease without esophagitis: Secondary | ICD-10-CM

## 2013-09-21 DIAGNOSIS — R002 Palpitations: Secondary | ICD-10-CM

## 2013-09-21 DIAGNOSIS — I872 Venous insufficiency (chronic) (peripheral): Secondary | ICD-10-CM

## 2013-09-21 DIAGNOSIS — I10 Essential (primary) hypertension: Secondary | ICD-10-CM

## 2013-09-21 DIAGNOSIS — E7800 Pure hypercholesterolemia, unspecified: Secondary | ICD-10-CM

## 2013-09-21 DIAGNOSIS — F419 Anxiety disorder, unspecified: Secondary | ICD-10-CM

## 2013-09-21 DIAGNOSIS — K5730 Diverticulosis of large intestine without perforation or abscess without bleeding: Secondary | ICD-10-CM

## 2013-09-21 DIAGNOSIS — M1990 Unspecified osteoarthritis, unspecified site: Secondary | ICD-10-CM

## 2013-09-21 DIAGNOSIS — F329 Major depressive disorder, single episode, unspecified: Secondary | ICD-10-CM

## 2013-09-21 DIAGNOSIS — R1319 Other dysphagia: Secondary | ICD-10-CM | POA: Insufficient documentation

## 2013-09-21 MED ORDER — PANTOPRAZOLE SODIUM 40 MG PO PACK: 40.0000 mg | PACK | Freq: Every day | ORAL | Status: DC

## 2013-09-21 NOTE — Progress Notes (Addendum)
Subjective:    Patient ID: Amy Williamson, female    DOB: [birth year], 63 y.o.   MRN: [id number]  HPI 63 y/o WF here for a yearly follow up visit...   ~  March 03, 2012:  41mo ROV & Amy Williamson has been doing ok since her husb, Amy Williamson, passed away (mult sclerosis)...    Hx Pneumonia/ LLL granuloma> prev pneumonia from 2012 & known LLL granuloma; she has NEB w/ Albut for prn use, no recent breathing problems; CXR last 11/12 was clear baseline film...    HBP> on Aten50;  BP= 128/82 & she denies HA, CP, palpit, SOB, edema...    VenInsuffic> on low sodium, elevation, support hose as needed...    Chol> on Simva40;  FLP showed TChol 160, TG 127, HDL 57, LDL 77    Obesity> weight remains ~200# despite her best effort w/ diet, exercise, etc...    GI> GERD, Hems> on Prilosec OTC as needed & recent hem prob treated w/ AnusolHC cream...    DJD/ FM/ LBP> uses rest, heat, Advil, etc...    Anxiety/ Depression> on Xanax 0.5mg  prn... We reviewed prob list, meds, xrays and labs> see below>>  EKG showed SBrady, rate50, wnl, NAD...  LABS 8/13:  FLP- at goals on Simva40;  Chems- wnl;  CBC- wnl;  TSH=2.48;  VitD=55  ~  February 12. 2014:  80mo ROV & Amy Williamson tells me she decided to stop the Simva40 despite being at goal numbers on this med- she has started a new OTC prep called PROTANDIM on advise from her niece who saw her Chol go from 250 to 180 on this product (her church is selling it & a wt loss program); she notes that there is liver disease in her family & was scared of the Statin despite her normal liver enzymes; Amy Williamson has lost 10# down to 193# on her diet so far...    Hx Pneumonia/ LLL granuloma> prev pneumonia from 2012 & known LLL granuloma; she has NEB w/ Albut for prn use, no recent breathing problems; CXR last 11/12 was clear baseline film...    HBP> on Aten50;  BP= 140/90 & she denies HA, CP, palpit, SOB, edema...    VenInsuffic> on low sodium, elevation, support hose as needed...    Chol> she stopped  Simva40 on her own & started supplement rec by her niece- Protandim; FLP shows TChol 204, TG 94, HDL 54, LDL 123 despite a 10# wt loss on diet; she will decide whether to continue the Prodandim or go back on the statin...    Obesity> weight is down 10# on diet to 193# with her best efforts at diet, exercise, etc...    GI> GERD, Hems> on Prilosec OTC as needed & recent hem prob treated w/ AnusolHC cream; colonoscopy 8/13 by [name] showed divertics & sm int hems; f/u 3yrs...    DJD/ FM/ LBP> uses rest, heat, Advil, etc...    Anxiety/ Depression> on Xanax 0.5mg  prn... We reviewed prob list, meds, xrays and labs> see below for updates >>   LABS 2/14:  FLP- ok x LDL=123 off the Simva40;  LFTs- wnl...   ~  March 24, 2013:  71mo ROV & time for her CPX> Amy Williamson has lost 20# further down to 173#- great job (still taking the Protandim supplement daily); She has Ortho surg sched for "trigger finger" later this month; We reviewed the following medical problems during today's office visit >>     Hx Pneumonia/ LLL granuloma> prev pneumonia from 2012 &  known LLL granuloma; she has NEB w/ Albut for prn use, no recent breathing problems; CXR last 11/12 was clear baseline film...    HBP> on Aten50;  BP= 138/78 & she denies HA, CP, palpit, SOB, edema; notes mod bradycardia & we decided to decr Aten50 to 1/2 tab & add Losar50...    VenInsuffic> on low sodium, elevation, support hose as needed...    Chol> she stopped Simva40 on her own & started supplement rec by her niece- Protandim; FLP shows TChol 210, TG 128, HDL 74, LDL 142 despite an additional 20# wt loss; Rec to try Simva20 & recheck labs in 67mo...    Obesity> weight is down 20# more on her diet to 173# with her best efforts at diet, exercise, etc...    GI> GERD, Hems> on Prilosec OTC as needed & recent hem prob treated w/ AnusolHC cream; colonoscopy 8/13 by [name] showed divertics & sm int hems; f/u 69yrs...    DJD/ FM/ LBP> she had Bilat CTS in the  past & sched for trigger finger surg per DrGramig later this month... uses rest, heat, Advil, etc...    Anxiety/ Depression> on Xanax 0.5mg  prn... We reviewed prob list, meds, xrays and labs> see below for updates >>   CXR 9/14 showed borderline heart size, clear lungs, no acute changes, DJD in sp & sl scoliosis  EKG 9/14 showed SBrady, rate50, WNL, NAD...  LABS 8/14:  FLP- still not at goals on diet w/ 20# wt loss LDL=142;  Chems- wnl;  CBC- wnl;  TSH=2.35...  ~  September 21, 2013:  38mo ROV &  Amy Williamson is quite distraught- she states that 3wks ago she was treated at an [facility] for URI/bronchitis w/ Zithromax & Pred; several days later she had the sudden onset of dysphagia which has been severe ever since; she was seen in the ER 09/08/13 (NOTE REVIEWED) sensation of food stuck in mid esoph- may go down or has to vomit, liquids were ok, some retrosternal pain w/ the episodes, Exam was neg, CXR- NAD, EKG- NAD, Labs- ok; ER talked to GI & pt placed on liq diet w/ office f/u by Amy Williamson on 09/09/13=> sounded like transient food impaction; EGD done by [name] 09/09/13- no evid of stricture, passage of 39mm Savory dilator, mild gastritis (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, neg HPylori); she was placed on Prilosec20; subseq BaEsophogram 09/15/13 showed sm sliding HH, no reflux, esoph dysmotility w/ spasm & stasis, no obstructing mass or stricture; she was referred to [facility] (appt is still pending);  She comes in today very distraught- she is unable to eat anything w/o vomiting, even Ensure consistancy has to be diluted w/ water to go down slowly if she stays upright for gravity drain; she has lost another 21# down to 151# today; she denies choking on food or liqs, voice is strong & not gravely/ hoarse/ etc; nothing present to suggest neuromusc syndrome, the symptoms have not gotten any better since it started ~2-3wks ago; she says that GI has released her, they have no further suggestions & she is desperate for help; she has not  taken any of ner meds since this started (& she stopped the Protandim supplement as well- I reviewed the literature on this supplement via internet & nothing like this prev described)...  We decided to check CT Chest for completeness & refer ASAP to [facility]- esophageal specialist for their prompt eval & help...    Hx Pneumonia/ LLL granuloma> prev pneumonia from 2012 & known LLL granuloma; she has  NEB w/ Albut for prn use, no recent breathing problems; CXR 11/12 was clear baseline film & CXR 09/08/13 via ER was clear, NAD...    HBP> prev on Aten50-1/2 & Losar50, she stopped all pills 2/15 w/ dysphagia;  BP= 132/78 & she denies HA, CP, palpit, SOB, edema...    VenInsuffic> on low sodium, elevation, support hose as needed...    Chol> she prev stopped Simva40 on her own & started supplement rec by her niece- Protandim; [place] 9/14 showed TChol 210, TG 128, HDL 74, LDL 142 despite her wt loss; she refused statin meds...    Obesity> weight is down 21# more w/ her severe dysphagia problem- down to 151# now...    SEVERE DYSPHAGIA of sudden onset 2/15> see above... SHE WILL CONTINUE Rx w/ Liq PPI & REFER TO [facility] ASAP...    GI> HxGERD, Hems> prev on Prilosec OTC as needed & hem prob treated w/ AnusolHC cream; colonoscopy 8/13 by [name] showed divertics & sm int hems; f/u 13yrs...    DJD/ FM/ LBP> she had Bilat CTS in the past & had trigger finger surg per 9/14... Prev on OTC analgesics as needed...    Anxiety/ Depression> prev on Xanax 0.5mg  prn but unable to swallow any pills since 09/08/13... We reviewed prob list, meds, xrays and labs> see below for updates >>   CXR 2/15 showed norm heart size, clear lungs, NAD.Marland KitchenMarland Kitchen  EKG 2/15 showed NSR, rate 64, late transition V1-4, NAD  LABS 2/15:  Chems- wnl;  CBC- wnl w/ Hg=13.2, WBC=11.8; UA- clear...  Ba Esophagram 2/15 showed esoph dysmotility w/ spasm & stasis, sm slidingHH but no reflux, no obstructing mass or stricture...  CT CHEST 3/15 showed no lymphadenopathy or  mass, esoph mildly dilated w/ dysmotility, lungs clear x min scarring in RML & Lingula, 1.4cm right adrenal nodule- likely adenoma, otherw neg CT... ADDENDUM>> 2DEcho 10/14/13 showed norm LVF w/ EF=55-60%, norm wall motion, Gr1DD, valves ok, norm pulm art pressures etc...            PROBLEM LIST:   ABNORMAL CHEST XRAY (ICD-793.1) - she has a <1cm calcif nodule seen in LLL- likely granuloma, without serial change; She has a Nebulizer w/ Albut for prn use. ~  CXR 6/09 showed no change... ~  CXR 3/10 showed borderline Cor size, NAD.[name]. ~  CXR 8/11 showed borderline cor, ectatic Ao, clear lungs, mild DJD spine. ~  CXR 8/12 showed borderline cor, clear lungs, NAD... ~  10/12:  She developed cough, sput w/ streaky hemoptysis, SOB, etc; went to [facility] w/ CXR showing right pneumonia; treated w/ IM Rocephin & po Avelox; f/u here & slowly improved w/ CXR 05/29/11 clear & back to baseline... ~  CXR 9/14 showed borderline heart size, clear lungs, no acute changes, DJD in sp & sl scoliosis ~  CXR 2/15 showed norm heart size, clear lungs, NAD.Marland KitchenMarland Kitchen ~  CT CHEST 3/15 (part of dyspagia eval) showed no lymphadenopathy or mass, esoph mildly dilated as seen w/ dysmotility, lungs clear x min scarring in RML & Lingula, 1.4cm right adrenal nodule- likely adenoma, otherw neg CT...  HYPERTENSION (ICD-401.9) & Hx of PALPITATIONS (ICD-785.1) >>  ~  on ASA 81mg /d + ATENOLOL 50mg /d...  ~  8/12:  BP= 130/70 and asymptomatic... denies HA, fatigue, visual changes, CP, palipit, dizziness, syncope, dyspnea, edema, etc... ~  11/12:  BP= 146/86 & she is improving clinically from her recent bout of pneumonia. ~  8/13:  BP= 128/82 & she denies HA, CP, palpit,  SOB, edema... EKG showed SBrady, rate50, wnl, NAD... ~  2/14:  on Aten50;  BP= 140/90 & she denies HA, CP, palpit, SOB, edema...  ~  9/14:  on Aten50;  BP= 138/78 & she denies HA, CP, palpit, SOB, edema; notes mod bradycardia & we decided to decr Aten50 to 1/2 tab & add Losar50 ~   3/15:  off her prev Aten50-1/2 & Losar50 due to dysphagia; BP= 132/78 & she denies CV symptoms...  VENOUS INSUFFICIENCY (ICD-459.81) - she has mod VI changes and trace edema... she knows to elim sodium, elevate, wear TED hose, etc...  HYPERCHOLESTEROLEMIA (ICD-272.0) >>  ~  on SIMVASTATIN 40mg /d... ~  [place] 6/08 showed TChol 138, Tg 75, HDL 49, LDL 74... continue same. ~  [place] 6/09 showed TChol 143, TG 98, HDL 55, LDL 68 ~  FLP 7/10 showed TChol 150, TG 104, HDL 56, LDL 73 ~  FLP 8/11 on Simva40 showed TChol 160, TG 119, HDL 56, LDL 80 ~  FLP 8/12 on Simva40 showed TChol 140, TG 76, HDL 59, LDL 66 ~  FLP 8/13 on Simva40 showed TChol 160, TG 127, HDL 57, LDL 77  ~  FLP 2/14 off Simva on Protandim showed TChol 204, TG 94, HDL 54, LDL 123... She will decide what to take. ~  [place] 9/14 on supplement showed TChol 210, TG 128, HDL 74, LDL 142... She agreed to try Simva20...  OBESITY (ICD-278.00) - she is 5\' 1"  tall and her weights of 190 -220 w/ BMI 36 - 42 ~  weight 6/08 = 163# ~  weight 7/09 = 188# ~  weight 3/10 = 217# ~  weight 8/11 = 211# ~  Weight 8/12 = 200# ~  Weight 8/13 = 203# ~  Weight 2/14 = 193# ~  Weight 9/14 = 173# ~  Weight 3/15 = 151# w/ 2-3wk hx of severe dysphagia of sudden onset...  GASTROESOPHAGEAL REFLUX DISEASE (ICD-530.81) - uses OTC Prilosec as needed & ANUSOL HC cream prn... ~  routine colonoscopy 9/03 by [name] was WNL... f/u planned 66yrs. ~  8/13:  She had recent BRB w/ Hem's suspected & treated w/ AnusolHC cream; due for f/u colon=> done 8/13 by [name] & showed mild divertics in sigmoid, & sm int hems believed to be the source of low vol rectal bleeding & rec to get on hi fiberr diet... F/u planned 21yrs.  SEVERE DYSPHAGIA >> presented 3/15 w/ hx severe sudden dysphagia as noted above; she has not had any of her medication since this started 2/15...  DEGENERATIVE JOINT DISEASE >> FIBROMYALGIA (ICD-729.1) - symptoms suggesting FM in the past... ortho eval from  [name]> right 4th trigger finger surg 6/12. ~  1/13: she had left knee surg by [name] w/ partial medial & lateral meniscectomies & shaving of the femoral condyles, plus excision of large Baker's cyst from left knee... ~  9/14: she had Bilat CTS in the past & had trigger finger surg per DrGramig...  MIGRAINES, HX OF (ICD-V13.8) - she has hx of migraines and notes some HA when lying back & occiput touches pillow at night... headaches have improved over time, using TYLENOL Prn...  ANXIETY & DEPRESSION (ICD-311) - under mod stress caring for husb w/ end-stage MS... ~  7/10: notes that Hospice signed off & now getting "care management" w/ [place] per week help at home... ALPRAZOLAM 0.5mg  Prn.  Health Maintenance - she had PNEUMOVAX 2006, TETANUS shot 2006, & yearly Flu shots... GYN= DrMiller for PAP (last 10/14 &  ok per pt), [facility], and BMD 2013 reported normal per pt... she takes Calcium, MVI, Vit D supplement...   Past Surgical History  Procedure Laterality Date  . Pulley release right trigger finger  01-08-2011  . Carpal tunnel release Right 2004  . Carpal tunnel release Left 2006  . Ear cyst excision  08/03/2011    Procedure: CYST REMOVAL;  Surgeon: Illene Labrador Aplington;  Location: [facility];  Service: Orthopedics;  Laterality: N/A;  EXCISION OF POPITEAL CYST   . Knee surgery      lt  . Leg cyst removal Left 07/2011    Bakers cyst  . Tubal ligation    . Cesarean section  [id number]  . Trigger finger release  04/20/13    right middle finger    Outpatient Encounter Prescriptions as of 09/21/2013  Medication Sig  . albuterol (PROVENTIL) (2.5 MG/3ML) 0.083% nebulizer solution Take 3 mLs (2.5 mg total) by nebulization as needed. 1 vial 2-3 times a day  . ALPRAZolam (XANAX) 0.5 MG tablet Take 1 tablet (0.5 mg total) by mouth as needed. Take 1/2 to 1 tablet three times a day as needed.  [name] amLODipine (NORVASC) 5 MG tablet Take 1  tablet (5 mg total) by mouth daily.  [name] aspirin 81 MG tablet Take 81 mg by mouth daily.   . calcium carbonate (OS-CAL) 600 MG TABS tablet Take 600 mg by mouth daily with breakfast.  . cholecalciferol (VITAMIN D) 1000 UNITS tablet Take 1,000 Units by mouth daily.  [name] ibuprofen (ADVIL,MOTRIN) 200 MG tablet Take 200 mg by mouth every 6 (six) hours as needed.  [name] losartan (COZAAR) 50 MG tablet Take 1 tablet (50 mg total) by mouth daily.  . Multiple Vitamin (MULTIVITAMIN) tablet Take 1 tablet by mouth daily.   . Multiple Vitamins-Minerals (ANTIOXIDANT FORMULA) CAPS Take 1 capsule by mouth daily.  [name] omeprazole (PRILOSEC) 20 MG capsule Take 1 capsule (20 mg total) by mouth daily.  . simvastatin (ZOCOR) 20 MG tablet Take 1 tablet (20 mg total) by mouth every evening.    Allergies  Allergen Reactions  . Avelox [Moxifloxacin Hcl In Nacl]     Pt did not like side effects  . Azithromycin     ?causes muscle weakness, dysphagia    Current Medications, Allergies, Past Medical History, Past Surgical History, Family History, and Social History were reviewed in [employer] record.    Review of Systems         The patient complains of severe dysphagia & further weight loss since 2/15; gets N&V if she eats anything.  The patient denies fever, chills, sweats, anorexia, weakness, malaise, sleep disorder, blurring, diplopia, eye irritation, eye discharge, vision loss, eye pain, photophobia, earache, ear discharge, tinnitus, decreased hearing, nasal congestion, nosebleeds, sore throat, hoarseness, chest pain, palpitations, syncope, orthopnea, PND, peripheral edema, dyspnea at rest, excessive sputum, hemoptysis, wheezing, pleurisy, diarrhea, constipation, change in bowel habits, abdominal pain, melena, hematochezia, jaundice, gas/bloating, indigestion/heartburn, dysphagia, odynophagia, dysuria, hematuria, urinary frequency, urinary hesitancy, nocturia, incontinence, back pain, joint pain, joint  swelling, muscle cramps, muscle weakness, stiffness, arthritis, sciatica, restless legs, leg pain at night, leg pain with exertion, rash, itching, dryness, suspicious lesions, paralysis, paresthesias, seizures, tremors, vertigo, transient blindness, frequent falls, frequent headaches, difficulty walking, memory loss, confusion, cold intolerance, heat intolerance, polydipsia, polyphagia, polyuria, unusual weight change, abnormal bruising, bleeding, enlarged lymph nodes, urticaria, allergic rash, hay fever, and recurrent infections.     Objective:  Physical Exam    WD, WN, 63 y/o WF in NAD... GENERAL:  Alert & oriented; pleasant & cooperative... HEENT:  Amy Williamson/AT, EOM-wnl, PERRLA, EACs-clear, TMs-wnl, NOSE-clear, THROAT-clear & wnl. NECK:  Supple w/ full ROM; no JVD; normal carotid impulses w/o bruits; no thyromegaly or nodules palpated; no lymphadenopathy. CHEST:  Clear to P & A; without wheezes/ rales/ or rhonchi. HEART:  Regular Rhythm; without murmurs/ rubs/ or gallops. ABDOMEN:  Soft & nontender; normal bowel sounds; no organomegaly or masses detected. EXT: without deformities, mild arthritic changes; no varicose veins/ venous insuffic/ or edema. NEURO:  CN's intact; motor testing normal; sensory testing normal; gait normal & balance OK. DERM:  No lesions noted; no rash etc...  RADIOLOGY DATA:  Reviewed in the EPIC EMR & discussed w/ the patient...  LABORATORY DATA:  Reviewed in the EPIC EMR & discussed w/ the patient...   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   Hx PNEUMONIA>  resolved after antibiotic Rx; improved clinically w/ rest, fluids, tylenol, mucinex, etc ==> back to baseline & no residual problems encountered...  Hx of Abnormal CXR> <1cm LLL calcif nodule= likely granuloma, no serial changes... CT Chest 3/15 for Dysphagia was  essntially neg x dilated esoph (SEE ABOVE).  HBP>  She had to stop all her pill meds due to dysphagia; with her wt loss to 151# her BP is fine at 132/78 today off meds...  Ven Insuffic>  Be sure to avoid sodium, elev legs, wera support hose as discussed..  CHOL>  LDL 9/14 up to 142 on Protandim; she refused statin meds...  Obesity>  Weight is down to 151#  Now w/ her severe dysphagia...  Hx GERD>  She used Prilosec OTC as needed... S/p colonoscopy 8/13 w/ divertics, int hems... rec to try liquid PPI prescription daily...  DJD/ FM>  She manages well & advised Yoga, stretching, etc; she had left knee surg by [name] 1/13...  Anxiety & Depression>  She has Alpraz for prn use...   Patient's Medications  New Prescriptions   PANTOPRAZOLE SODIUM (PROTONIX) 40 MG/20 ML PACK    Take 20 mLs (40 mg total) by mouth daily.  Previous Medications   ASPIRIN 81 MG TABLET    Take 81 mg by mouth daily. HOLD   CALCIUM CARBONATE (OS-CAL) 600 MG TABS TABLET    Take 600 mg by mouth daily with breakfast. HOLD   CHOLECALCIFEROL (VITAMIN D) 1000 UNITS TABLET    Take 1,000 Units by mouth daily. HOLD   IBUPROFEN (ADVIL,MOTRIN) 200 MG TABLET    Take 200 mg by mouth every 6 (six) hours as needed. HOLD   MULTIPLE VITAMIN (MULTIVITAMIN) TABLET    Take 1 tablet by mouth daily. HOLD   MULTIPLE VITAMINS-MINERALS (ANTIOXIDANT FORMULA) CAPS    Take 1 capsule by mouth daily. HOLD  Modified Medications   Modified Medication Previous Medication   ALBUTEROL (PROVENTIL) (2.5 MG/3ML) 0.083% NEBULIZER SOLUTION albuterol (PROVENTIL) (2.5 MG/3ML) 0.083% nebulizer solution      Take 2.5 mg by nebulization as needed. HOLD 1 vial 2-3 times a day    Take 3 mLs (2.5 mg total) by nebulization as needed. 1 vial 2-3 times a day   ALPRAZOLAM (XANAX) 0.5 MG TABLET ALPRAZolam (XANAX) 0.5 MG tablet      HOLD  Take 1/2 to 1 tablet three times a day  as needed.    Take 1 tablet (0.5 mg total) by mouth as needed. Take 1/2 to 1 tablet three  times a day as needed.   AMLODIPINE (NORVASC) 5 MG TABLET amLODipine (NORVASC) 5 MG tablet      Take 5 mg by mouth daily. HOLD    Take 1 tablet (5 mg total) by mouth daily.   LOSARTAN (COZAAR) 50 MG TABLET losartan (COZAAR) 50 MG tablet      Take 50 mg by mouth daily. HOLD    Take 1 tablet (50 mg total) by mouth daily.   OMEPRAZOLE (PRILOSEC) 20 MG CAPSULE omeprazole (PRILOSEC) 20 MG capsule      Take 20 mg by mouth daily. HOLD    Take 1 capsule (20 mg total) by mouth daily.   SIMVASTATIN (ZOCOR) 20 MG TABLET simvastatin (ZOCOR) 20 MG tablet      Take 20 mg by mouth every evening. HOLD    Take 1 tablet (20 mg total) by mouth every evening.  Discontinued Medications   No medications on file

## 2013-09-21 NOTE — Patient Instructions (Addendum)
Today we updated your med list in our EPIC system...    Continue your current medications the same...    We will place all your pill medications on HOLD for now...  We are changing the Prilosec to PROTONIX suspension- take 40mg  first thing in the AM (30 min before the first meal)  We will arrange for an ASAP evaluation by the Esophageal specialist at [facility]...    In the meanwhile do the best you can w/ thin liquid nutrition while sitting or standing upright...    Do not eat or drink anything within 4h of bedtime or lying down...  We will check a CT scan of your chest to be sure there is nothing there that could relate to your symptoms...  Call for any questions.Marland KitchenMarland Kitchen

## 2013-09-23 ENCOUNTER — Ambulatory Visit (INDEPENDENT_AMBULATORY_CARE_PROVIDER_SITE_OTHER)
Admission: RE | Admit: 2013-09-23 | Discharge: 2013-09-23 | Disposition: A | Discharge status: Home / Self Care | Payer: 59 | Source: Ambulatory Visit | Attending: Pulmonary Disease | Admitting: Pulmonary Disease

## 2013-09-23 DIAGNOSIS — R1319 Other dysphagia: Secondary | ICD-10-CM

## 2013-09-23 MED ORDER — IOHEXOL 300 MG/ML  SOLN
80.0000 mL | Freq: Once | INTRAMUSCULAR | Status: AC | PRN
  Administered 2013-09-23: 80 mL via INTRAVENOUS

## 2013-09-25 ENCOUNTER — Other Ambulatory Visit: Payer: Self-pay | Admitting: Pulmonary Disease

## 2013-09-25 DIAGNOSIS — I10 Essential (primary) hypertension: Secondary | ICD-10-CM

## 2013-10-14 ENCOUNTER — Encounter: Payer: Self-pay | Admitting: Cardiology

## 2013-10-14 ENCOUNTER — Ambulatory Visit (HOSPITAL_COMMUNITY): Payer: 59 | Attending: Cardiology | Admitting: Radiology

## 2013-10-14 DIAGNOSIS — R930 Abnormal findings on diagnostic imaging of skull and head, not elsewhere classified: Secondary | ICD-10-CM | POA: Insufficient documentation

## 2013-10-14 DIAGNOSIS — I10 Essential (primary) hypertension: Secondary | ICD-10-CM

## 2013-10-14 NOTE — Progress Notes (Signed)
Echocardiogram performed.  

## 2013-10-19 ENCOUNTER — Telehealth: Payer: Self-pay | Admitting: Pulmonary Disease

## 2013-10-19 NOTE — Telephone Encounter (Signed)
Notes Recorded by Noralee Space, MD on 10/17/2013 at 10:50 AM Please notify patient>  2DEcho is basically WNL, norm LVF, no pulm HTN etc... We are awaiting the eval at [place]...  Pt advised. Milford Bing, CMA

## 2013-11-05 ENCOUNTER — Other Ambulatory Visit: Payer: Self-pay

## 2013-11-05 DIAGNOSIS — Z1231 Encounter for screening mammogram for malignant neoplasm of breast: Secondary | ICD-10-CM

## 2013-11-15 ENCOUNTER — Other Ambulatory Visit: Payer: Self-pay | Admitting: Pulmonary Disease

## 2013-11-19 ENCOUNTER — Ambulatory Visit
Admission: RE | Admit: 2013-11-19 | Discharge: 2013-11-19 | Disposition: A | Discharge status: Home / Self Care | Payer: 59 | Source: Ambulatory Visit

## 2013-11-19 DIAGNOSIS — Z1231 Encounter for screening mammogram for malignant neoplasm of breast: Secondary | ICD-10-CM

## 2014-03-16 ENCOUNTER — Encounter: Payer: Self-pay | Admitting: Internal Medicine

## 2014-03-16 ENCOUNTER — Ambulatory Visit (INDEPENDENT_AMBULATORY_CARE_PROVIDER_SITE_OTHER): Payer: 59 | Admitting: Internal Medicine

## 2014-03-16 ENCOUNTER — Other Ambulatory Visit (INDEPENDENT_AMBULATORY_CARE_PROVIDER_SITE_OTHER): Payer: 59

## 2014-03-16 VITALS — BP 130/78 | HR 61 | Temp 97.8°F | Resp 16 | Ht 60.5 in | Wt 145.0 lb

## 2014-03-16 DIAGNOSIS — E7800 Pure hypercholesterolemia, unspecified: Secondary | ICD-10-CM

## 2014-03-16 DIAGNOSIS — Z23 Encounter for immunization: Secondary | ICD-10-CM

## 2014-03-16 DIAGNOSIS — R1319 Other dysphagia: Secondary | ICD-10-CM

## 2014-03-16 DIAGNOSIS — I10 Essential (primary) hypertension: Secondary | ICD-10-CM

## 2014-03-16 DIAGNOSIS — Z0000 Encounter for general adult medical examination without abnormal findings: Secondary | ICD-10-CM

## 2014-03-16 DIAGNOSIS — R634 Abnormal weight loss: Secondary | ICD-10-CM

## 2014-03-16 LAB — HEPATIC FUNCTION PANEL
ALBUMIN: 4.2 g/dL (ref 3.5–5.2)
ALT: 12 U/L (ref 0–35)
AST: 15 U/L (ref 0–37)
Alkaline Phosphatase: 66 U/L (ref 39–117)
BILIRUBIN TOTAL: 0.7 mg/dL (ref 0.2–1.2)
Bilirubin, Direct: 0.1 mg/dL (ref 0.0–0.3)
Total Protein: 7 g/dL (ref 6.0–8.3)

## 2014-03-16 LAB — CBC WITH DIFFERENTIAL/PLATELET
BASOS ABS: 0 10*3/uL (ref 0.0–0.1)
Basophils Relative: 0.5 % (ref 0.0–3.0)
EOS ABS: 0.1 10*3/uL (ref 0.0–0.7)
Eosinophils Relative: 0.8 % (ref 0.0–5.0)
HCT: 40.7 % (ref 36.0–46.0)
Hemoglobin: 13.8 g/dL (ref 12.0–15.0)
Lymphocytes Relative: 26.9 % (ref 12.0–46.0)
Lymphs Abs: 2.1 10*3/uL (ref 0.7–4.0)
MCHC: 33.8 g/dL (ref 30.0–36.0)
MCV: 86.8 fl (ref 78.0–100.0)
Monocytes Absolute: 0.5 10*3/uL (ref 0.1–1.0)
Monocytes Relative: 5.8 % (ref 3.0–12.0)
NEUTROS ABS: 5.3 10*3/uL (ref 1.4–7.7)
NEUTROS PCT: 66 % (ref 43.0–77.0)
Platelets: 209 10*3/uL (ref 150.0–400.0)
RBC: 4.68 Mil/uL (ref 3.87–5.11)
RDW: 13.6 % (ref 11.5–15.5)
WBC: 8 10*3/uL (ref 4.0–10.5)

## 2014-03-16 LAB — LIPID PANEL
Cholesterol: 217 mg/dL — ABNORMAL HIGH (ref 0–200)
HDL: 61.5 mg/dL (ref >39.00)
LDL CALC: 127 mg/dL — AB (ref 0–99)
NonHDL: 155.5
Total CHOL/HDL Ratio: 4
Triglycerides: 142 mg/dL (ref 0.0–149.0)
VLDL: 28.4 mg/dL (ref 0.0–40.0)

## 2014-03-16 LAB — BASIC METABOLIC PANEL
BUN: 19 mg/dL (ref 6–23)
CO2: 31 meq/L (ref 19–32)
CREATININE: 0.7 mg/dL (ref 0.4–1.2)
Calcium: 9.8 mg/dL (ref 8.4–10.5)
Chloride: 101 mEq/L (ref 96–112)
GFR: 90.93 mL/min (ref >60.00)
GLUCOSE: 94 mg/dL (ref 70–99)
Potassium: 4.2 mEq/L (ref 3.5–5.1)
SODIUM: 140 meq/L (ref 135–145)

## 2014-03-16 LAB — FERRITIN: Ferritin: 46.5 ng/mL (ref 10.0–291.0)

## 2014-03-16 LAB — VITAMIN B12: Vitamin B-12: 294 pg/mL (ref 211–911)

## 2014-03-16 LAB — TSH: TSH: 1.43 u[IU]/mL (ref 0.35–4.50)

## 2014-03-16 LAB — MAGNESIUM: MAGNESIUM: 1.9 mg/dL (ref 1.5–2.5)

## 2014-03-16 LAB — VITAMIN D 25 HYDROXY (VIT D DEFICIENCY, FRACTURES): VITD: 51.06 ng/mL (ref 30.00–100.00)

## 2014-03-16 NOTE — Assessment & Plan Note (Signed)
BP Readings from Last 3 Encounters:  03/16/14 130/78  09/21/13 132/78  09/09/13 126/62   Prev on amlodipine and losartan Stopped all 08/2013 when having dysphagia flare -  Swallow now improved but BP within range off meds Weight loss efforts and success with same reviewed Monitor but no meds recommended at this time Baseline bradycardia noted (HR 60 today)

## 2014-03-16 NOTE — Assessment & Plan Note (Signed)
Sudden onset 08/2013 following bronchitis event eval at [facility] - neg EGD and normal manometry; also neg CT chest here for extrinsic reason Improving symptoms with PPI (increase dose 12/2013) - Weight loss associated with same Check screening and nutritional labs because of same

## 2014-03-16 NOTE — Patient Instructions (Addendum)
It was good to see you today.  We have reviewed your prior records including labs and tests today  Test(s) ordered today. Your results will be released to [facility] (or called to you) after review, usually within 72hours after test completion. If any changes need to be made, you will be notified at that same time.  Medications reviewed and updated, no changes recommended at this time.  Continue working with your specialists as reviewed today  Please schedule followup in 6-12 months, call sooner if problems.   Health Maintenance Adopting a healthy lifestyle and getting preventive care can go a long way to promote health and wellness. Talk with your health care provider about what schedule of regular examinations is right for you. This is a good chance for you to check in with your provider about disease prevention and staying healthy. In between checkups, there are plenty of things you can do on your own. Experts have done a lot of research about which lifestyle changes and preventive measures are most likely to keep you healthy. Ask your health care provider for more information. WEIGHT AND DIET  Eat a healthy diet  Be sure to include plenty of vegetables, fruits, low-fat dairy products, and lean protein.  Do not eat a lot of foods high in solid fats, added sugars, or salt.  Get regular exercise. This is one of the most important things you can do for your health.  Most adults should exercise for at least 150 minutes each week. The exercise should increase your heart rate and make you sweat (moderate-intensity exercise).  Most adults should also do strengthening exercises at least twice a week. This is in addition to the moderate-intensity exercise.  Maintain a healthy weight  Body mass index (BMI) is a measurement that can be used to identify possible weight problems. It estimates body fat based on height and weight. Your health care provider can help determine your BMI and help you  achieve or maintain a healthy weight.  For females 74 years of age and older:   A BMI below 18.5 is considered underweight.  A BMI of 18.5 to 24.9 is normal.  A BMI of 25 to 29.9 is considered overweight.  A BMI of 30 and above is considered obese.  Watch levels of cholesterol and blood lipids  You should start having your blood tested for lipids and cholesterol at 64 years of age, then have this test every 5 years.  You may need to have your cholesterol levels checked more often if:  Your lipid or cholesterol levels are high.  You are older than 64 years of age.  You are at high risk for heart disease.  CANCER SCREENING   Lung Cancer  Lung cancer screening is recommended for adults 14-66 years old who are at high risk for lung cancer because of a history of smoking.  A yearly low-dose CT scan of the lungs is recommended for people who:  Currently smoke.  Have quit within the past 15 years.  Have at least a 30-pack-year history of smoking. A pack year is smoking an average of one pack of cigarettes a day for 1 year.  Yearly screening should continue until it has been 15 years since you quit.  Yearly screening should stop if you develop a health problem that would prevent you from having lung cancer treatment.  Breast Cancer  Practice breast self-awareness. This means understanding how your breasts normally appear and feel.  It also means doing regular breast  self-exams. Let your health care provider know about any changes, no matter how small.  If you are in your 20s or 30s, you should have a clinical breast exam (CBE) by a health care provider every 1-3 years as part of a regular health exam.  If you are 15 or older, have a CBE every year. Also consider having a breast X-ray (mammogram) every year.  If you have a family history of breast cancer, talk to your health care provider about genetic screening.  If you are at high risk for breast cancer, talk to your  health care provider about having an MRI and a mammogram every year.  Breast cancer gene (BRCA) assessment is recommended for women who have family members with BRCA-related cancers. BRCA-related cancers include:  Breast.  Ovarian.  Tubal.  Peritoneal cancers.  Results of the assessment will determine the need for genetic counseling and BRCA1 and BRCA2 testing. Cervical Cancer Routine pelvic examinations to screen for cervical cancer are no longer recommended for nonpregnant women who are considered low risk for cancer of the pelvic organs (ovaries, uterus, and vagina) and who do not have symptoms. A pelvic examination may be necessary if you have symptoms including those associated with pelvic infections. Ask your health care provider if a screening pelvic exam is right for you.   The Pap test is the screening test for cervical cancer for women who are considered at risk.  If you had a hysterectomy for a problem that was not cancer or a condition that could lead to cancer, then you no longer need Pap tests.  If you are older than 65 years, and you have had normal Pap tests for the past 10 years, you no longer need to have Pap tests.  If you have had past treatment for cervical cancer or a condition that could lead to cancer, you need Pap tests and screening for cancer for at least 20 years after your treatment.  If you no longer get a Pap test, assess your risk factors if they change (such as having a new sexual partner). This can affect whether you should start being screened again.  Some women have medical problems that increase their chance of getting cervical cancer. If this is the case for you, your health care provider may recommend more frequent screening and Pap tests.  The human papillomavirus (HPV) test is another test that may be used for cervical cancer screening. The HPV test looks for the virus that can cause cell changes in the cervix. The cells collected during the Pap  test can be tested for HPV.  The HPV test can be used to screen women 27 years of age and older. Getting tested for HPV can extend the interval between normal Pap tests from three to five years.  An HPV test also should be used to screen women of any age who have unclear Pap test results.  After 64 years of age, women should have HPV testing as often as Pap tests.  Colorectal Cancer  This type of cancer can be detected and often prevented.  Routine colorectal cancer screening usually begins at 64 years of age and continues through 64 years of age.  Your health care provider may recommend screening at an earlier age if you have risk factors for colon cancer.  Your health care provider may also recommend using home test kits to check for hidden blood in the stool.  A small camera at the end of a tube can be  used to examine your colon directly (sigmoidoscopy or colonoscopy). This is done to check for the earliest forms of colorectal cancer.  Routine screening usually begins at age 81.  Direct examination of the colon should be repeated every 5-10 years through 64 years of age. However, you may need to be screened more often if early forms of precancerous polyps or small growths are found. Skin Cancer  Check your skin from head to toe regularly.  Tell your health care provider about any new moles or changes in moles, especially if there is a change in a mole's shape or color.  Also tell your health care provider if you have a mole that is larger than the size of a pencil eraser.  Always use sunscreen. Apply sunscreen liberally and repeatedly throughout the day.  Protect yourself by wearing long sleeves, pants, a wide-brimmed hat, and sunglasses whenever you are outside. HEART DISEASE, DIABETES, AND HIGH BLOOD PRESSURE   Have your blood pressure checked at least every 1-2 years. High blood pressure causes heart disease and increases the risk of stroke.  If you are between 65 years  and 3 years old, ask your health care provider if you should take aspirin to prevent strokes.  Have regular diabetes screenings. This involves taking a blood sample to check your fasting blood sugar level.  If you are at a normal weight and have a low risk for diabetes, have this test once every three years after 64 years of age.  If you are overweight and have a high risk for diabetes, consider being tested at a younger age or more often. PREVENTING INFECTION  Hepatitis B  If you have a higher risk for hepatitis B, you should be screened for this virus. You are considered at high risk for hepatitis B if:  You were born in a country where hepatitis B is common. Ask your health care provider which countries are considered high risk.  Your parents were born in a high-risk country, and you have not been immunized against hepatitis B (hepatitis B vaccine).  You have HIV or AIDS.  You use needles to inject street drugs.  You live with someone who has hepatitis B.  You have had sex with someone who has hepatitis B.  You get hemodialysis treatment.  You take certain medicines for conditions, including cancer, organ transplantation, and autoimmune conditions. Hepatitis C  Blood testing is recommended for:  Everyone born from [birth year] through 1966.  Anyone with known risk factors for hepatitis C. Sexually transmitted infections (STIs)  You should be screened for sexually transmitted infections (STIs) including gonorrhea and chlamydia if:  You are sexually active and are younger than 64 years of age.  You are older than 64 years of age and your health care provider tells you that you are at risk for this type of infection.  Your sexual activity has changed since you were last screened and you are at an increased risk for chlamydia or gonorrhea. Ask your health care provider if you are at risk.  If you do not have HIV, but are at risk, it may be recommended that you take a prescription  medicine daily to prevent HIV infection. This is called pre-exposure prophylaxis (PrEP). You are considered at risk if:  You are sexually active and do not regularly use condoms or know the HIV status of your partner(s).  You take drugs by injection.  You are sexually active with a partner who has HIV. Talk with your health care  provider about whether you are at high risk of being infected with HIV. If you choose to begin PrEP, you should first be tested for HIV. You should then be tested every 3 months for as long as you are taking PrEP.  PREGNANCY   If you are premenopausal and you may become pregnant, ask your health care provider about preconception counseling.  If you may become pregnant, take 400 to 800 micrograms (mcg) of folic acid every day.  If you want to prevent pregnancy, talk to your health care provider about birth control (contraception). OSTEOPOROSIS AND MENOPAUSE   Osteoporosis is a disease in which the bones lose minerals and strength with aging. This can result in serious bone fractures. Your risk for osteoporosis can be identified using a bone density scan.  If you are 49 years of age or older, or if you are at risk for osteoporosis and fractures, ask your health care provider if you should be screened.  Ask your health care provider whether you should take a calcium or vitamin D supplement to lower your risk for osteoporosis.  Menopause may have certain physical symptoms and risks.  Hormone replacement therapy may reduce some of these symptoms and risks. Talk to your health care provider about whether hormone replacement therapy is right for you.  HOME CARE INSTRUCTIONS   Schedule regular health, dental, and eye exams.  Stay current with your immunizations.   Do not use any tobacco products including cigarettes, chewing tobacco, or electronic cigarettes.  If you are pregnant, do not drink alcohol.  If you are breastfeeding, limit how much and how often you  drink alcohol.  Limit alcohol intake to no more than 1 drink per day for nonpregnant women. One drink equals 12 ounces of beer, 5 ounces of wine, or 1 ounces of hard liquor.  Do not use street drugs.  Do not share needles.  Ask your health care provider for help if you need support or information about quitting drugs.  Tell your health care provider if you often feel depressed.  Tell your health care provider if you have ever been abused or do not feel safe at home. Document Released: 01/22/2011 Document Revised: 11/23/2013 Document Reviewed: 06/10/2013 [facility] Patient Information 2015 [place], [place]. This information is not intended to replace advice given to you by your health care provider. Make sure you discuss any questions you have with your health care provider.

## 2014-03-16 NOTE — Progress Notes (Signed)
Subjective:    Patient ID: Amy Williamson, female    DOB: 1951-06-11, 63 y.o.   MRN: [id number]  HPI  Patient here to establish care with new PCP due to retirement of Lenna Gilford   Here for annual wellness  Diet: heart healthy  Physical activity: walks 4x/wk Depression/mood screen: negative Hearing: intact to whispered voice Visual acuity: grossly normal, performs annual eye exam  ADLs: capable Fall risk: none Home safety: good Cognitive evaluation: intact to orientation, naming, recall and repetition EOL planning: adv directives, full code/ I agree  I have personally reviewed and have noted 1. The patient's medical and social history 2. Their use of alcohol, tobacco or illicit drugs 3. Their current medications and supplements 4. The patient's functional ability including ADL's, fall risks, home safety risks and hearing or visual impairment. 5. Diet and physical activities 6. Evidence for depression or mood disorders  Also reviewed chronic medical issues and interval medical events  Past Medical History  Diagnosis Date  . Unspecified essential hypertension   . Palpitations   . Unspecified venous (peripheral) insufficiency   . Pure hypercholesterolemia   . Obesity, unspecified   . Depressive disorder, not elsewhere classified   . History of pneumonia 05-14-11  . Bronchitis 07-14-11  . History of esophageal reflux   . History of migraine headaches   . Arthritis     BACK AND SHOULDERS  . Cataract immature BILATERAL   Family History  Problem Relation Age of Onset  . Heart failure Mother   . Liver cancer Brother   . Heart disease Father   . Clotting disorder Father   . Liver disease Maternal Aunt     x2  . Osteoporosis Mother   . Hyperlipidemia Mother   . Hypertension Mother   . Colon cancer Neg Hx    History  Substance Use Topics  . Smoking status: Never Smoker   . Smokeless tobacco: Never Used  . Alcohol Use: No    Review of Systems  Constitutional:  Positive for unexpected weight change. Negative for fever and fatigue.  Respiratory: Negative for cough, shortness of breath and wheezing.   Cardiovascular: Negative for chest pain, palpitations and leg swelling.  Gastrointestinal: Negative for nausea, vomiting, abdominal pain, diarrhea, constipation and abdominal distention.  Neurological: Negative for dizziness, weakness, light-headedness and headaches.  Psychiatric/Behavioral: Negative for dysphoric mood. The patient is not nervous/anxious.   All other systems reviewed and are negative.      Objective:   Physical Exam  BP 130/78  Pulse 61  Temp(Src) 97.8 F (36.6 C) (Oral)  Resp 16  Ht 5' 0.5" (1.537 m)  Wt 145 lb (65.772 kg)  BMI 27.84 kg/m2  SpO2 98%  LMP 07/23/2004 Wt Readings from Last 3 Encounters:  03/16/14 145 lb (65.772 kg)  09/21/13 151 lb (68.493 kg)  09/09/13 156 lb (70.761 kg)   Constitutional: She appears well-developed and well-nourished. No distress.  Neck: Normal range of motion. Neck supple. No JVD present. No thyromegaly present.  Cardiovascular: Normal rate, regular rhythm and normal heart sounds.  No murmur heard. No BLE edema. Pulmonary/Chest: Effort normal and breath sounds normal. No respiratory distress. She has no wheezes.  Psychiatric: She has a normal mood and affect. Her behavior is normal. Judgment and thought content normal.   Lab Results  Component Value Date   WBC 11.8* 09/08/2013   HGB 13.2 09/08/2013   HCT 39.2 09/08/2013   PLT 239 09/08/2013   GLUCOSE 101* 09/08/2013   CHOL  210* 03/16/2013   TRIG 128.0 03/16/2013   HDL 47.00 03/16/2013   LDLDIRECT 142.3 03/16/2013   LDLCALC 77 03/03/2012   ALT 23 09/08/2013   AST 17 09/08/2013   NA 141 09/08/2013   K 4.4 09/08/2013   CL 99 09/08/2013   CREATININE 0.84 09/08/2013   BUN 21 09/08/2013   CO2 30 09/08/2013   TSH 2.35 03/16/2013    Mm Digital Screening Bilateral  11/19/2013   CLINICAL DATA:  Screening.  EXAM: DIGITAL SCREENING BILATERAL MAMMOGRAM  WITH CAD  COMPARISON:  Previous exam(s).  ACR Breast Density Category b: There are scattered areas of fibroglandular density.  FINDINGS: There are no findings suspicious for malignancy. Images were processed with CAD.  IMPRESSION: No mammographic evidence of malignancy. A result letter of this screening mammogram will be mailed directly to the patient.  RECOMMENDATION: Screening mammogram in one year. (Code:[id number]-B-01Y)  BI-RADS CATEGORY  1: Negative.   Electronically Signed   By: Abelardo Diesel M.D.   On: 11/19/2013 15:21       Assessment & Plan:   AWV/v70.0 - Today patient counseled on age appropriate routine health concerns for screening and prevention, each reviewed and up to date or declined. Immunizations reviewed and up to date or declined. Labs ordered and reviewed. Risk factors for depression reviewed and negative. Hearing function and visual acuity are intact. ADLs screened and addressed as needed. Functional ability and level of safety reviewed and appropriate. Education, counseling and referrals performed based on assessed risks today. Patient provided with a copy of personalized plan for preventive services.  Problem List Items Addressed This Visit   HYPERCHOLESTEROLEMIA - Primary     remotely on simva, stopped for natural med and then stopped all meds early 2015 Recheck lipids now and consider med as needed, noting significant weight loss efforts ongoing    Relevant Orders      Lipid panel      Vit D  25 hydroxy (rtn osteoporosis monitoring)      Magnesium      Ferritin   HYPERTENSION      BP Readings from Last 3 Encounters:  03/16/14 130/78  09/21/13 132/78  09/09/13 126/62   Prev on amlodipine and losartan Stopped all 08/2013 when having dysphagia flare -  Swallow now improved but BP within range off meds Weight loss efforts and success with same reviewed Monitor but no meds recommended at this time Baseline bradycardia noted (HR 60 today)    Relevant Orders      Basic  metabolic panel      Vit D  25 hydroxy (rtn osteoporosis monitoring)      Magnesium      Ferritin   Other dysphagia     Sudden onset 08/2013 following bronchitis event eval at [facility] - neg EGD and normal manometry; also neg CT chest here for extrinsic reason Improving symptoms with PPI (increase dose 12/2013) - Weight loss associated with same Check screening and nutritional labs because of same    Relevant Orders      CBC with Differential      Vitamin B12      Vit D  25 hydroxy (rtn osteoporosis monitoring)      Magnesium      Ferritin    Other Visit Diagnoses   Loss of weight        Relevant Orders       CBC with Differential       Hepatic function panel  TSH       Vitamin B12       Vit D  25 hydroxy (rtn osteoporosis monitoring)       Magnesium       Ferritin

## 2014-03-16 NOTE — Progress Notes (Signed)
Pre visit review using our clinic review tool, if applicable. No additional management support is needed unless otherwise documented below in the visit note. 

## 2014-03-16 NOTE — Assessment & Plan Note (Signed)
remotely on simva, stopped for natural med and then stopped all meds early 2015 Recheck lipids now and consider med as needed, noting significant weight loss efforts ongoing

## 2014-03-24 ENCOUNTER — Ambulatory Visit: Payer: 59 | Admitting: Internal Medicine

## 2014-05-06 HISTORY — PX: TRIGGER FINGER RELEASE: SHX641

## 2014-05-20 ENCOUNTER — Ambulatory Visit (INDEPENDENT_AMBULATORY_CARE_PROVIDER_SITE_OTHER): Payer: 59 | Admitting: Obstetrics & Gynecology

## 2014-05-20 ENCOUNTER — Encounter: Payer: Self-pay | Admitting: Obstetrics & Gynecology

## 2014-05-20 VITALS — BP 158/70 | HR 60 | Resp 16 | Ht 60.25 in | Wt 149.8 lb

## 2014-05-20 DIAGNOSIS — Z0000 Encounter for general adult medical examination without abnormal findings: Secondary | ICD-10-CM

## 2014-05-20 DIAGNOSIS — E2839 Other primary ovarian failure: Secondary | ICD-10-CM

## 2014-05-20 DIAGNOSIS — Z01419 Encounter for gynecological examination (general) (routine) without abnormal findings: Secondary | ICD-10-CM

## 2014-05-20 LAB — POCT URINALYSIS DIPSTICK
BILIRUBIN UA: NEGATIVE
Blood, UA: NEGATIVE
GLUCOSE UA: NEGATIVE
Ketones, UA: NEGATIVE
NITRITE UA: NEGATIVE
Protein, UA: NEGATIVE
UROBILINOGEN UA: NEGATIVE
pH, UA: 7

## 2014-05-20 NOTE — Progress Notes (Signed)
63 y.o. G2P2 WidowedCaucasianF here for annual exam.  Had significant issues this year with swallowing.  Saw Dr. Olevia Perches in Feb.  Had endoscopy.  Was referred to specialist at [facility]--Dr. Carlton Adam.  Did have a tissue biopsy that was normal.  Now, swallowing is better.  Couldn't swallow solids for three months.  Pt has low about 20 pounds.  She has gained some weight back so this has stopped.  PCP:  Dr. Asa Lente.  Saw 8/15.    Patient's last menstrual period was 07/23/2004.          Sexually active: No.  The current method of family planning is tubal ligation.    Exercising: No.  not regularly Smoker:  no  Health Maintenance: Pap:  05/01/13 WNL History of abnormal Pap:  no MMG:  11/19/13-normal Colonoscopy:  2013-repeat in 10 years Dr Olevia Perches BMD:   4/10 TDaP:  2006 Screening Labs: PCP, Hb today: PCP, Urine today: WBC-trace, PH-7.0   reports that she has never smoked. She has never used smokeless tobacco. She reports that she does not drink alcohol or use illicit drugs.  Past Medical History  Diagnosis Date  . Unspecified essential hypertension   . Palpitations   . Unspecified venous (peripheral) insufficiency   . Pure hypercholesterolemia   . Obesity, unspecified   . Depressive disorder, not elsewhere classified   . History of pneumonia 05-14-11  . Bronchitis 07-14-11  . History of esophageal reflux   . History of migraine headaches   . Arthritis     BACK AND SHOULDERS  . Cataract immature BILATERAL  . Dysphagia     Past Surgical History  Procedure Laterality Date  . Pulley release right trigger finger  01-08-2011  . Carpal tunnel release Right 2004  . Carpal tunnel release Left 2006  . Ear cyst excision  08/03/2011    patient states this is incorrect  . Knee surgery      lt  . Leg cyst removal Left 07/2011    Bakers cyst  . Tubal ligation    . Cesarean section  [id number]  . Trigger finger release  04/20/13    right middle finger  . Trigger finger release  10/15    right  hand    Current Outpatient Prescriptions  Medication Sig Dispense Refill  . omeprazole (PRILOSEC) 40 MG capsule Take 40 mg by mouth daily.       No current facility-administered medications for this visit.    Family History  Problem Relation Age of Onset  . Heart failure Mother   . Liver cancer Brother   . Heart disease Father   . Clotting disorder Father   . Liver disease Maternal Aunt     x2  . Osteoporosis Mother   . Hyperlipidemia Mother   . Hypertension Mother   . Colon cancer Neg Hx     ROS:  Pertinent items are noted in HPI.  Otherwise, a comprehensive ROS was negative.  Exam:   BP 158/70  Pulse 60  Resp 16  Ht 5' 0.25" (1.53 m)  Wt 149 lb 12.8 oz (67.949 kg)  BMI 29.03 kg/m2  LMP 07/23/2004  Weight change: -18#  Height: 5' 0.25" (153 cm)  Ht Readings from Last 3 Encounters:  05/20/14 5' 0.25" (1.53 m)  03/16/14 5' 0.5" (1.537 m)  09/21/13 5' 0.5" (1.537 m)    General appearance: alert, cooperative and appears stated age Head: Normocephalic, without obvious abnormality, atraumatic Neck: no adenopathy, supple, symmetrical, trachea midline and thyroid normal  to inspection and palpation Lungs: clear to auscultation bilaterally Breasts: normal appearance, no masses or tenderness Heart: regular rate and rhythm Abdomen: soft, non-tender; bowel sounds normal; no masses,  no organomegaly Extremities: extremities normal, atraumatic, no cyanosis or edema Skin: Skin color, texture, turgor normal. No rashes or lesions Lymph nodes: Cervical, supraclavicular, and axillary nodes normal. No abnormal inguinal nodes palpated Neurologic: Grossly normal   Pelvic: External genitalia:  no lesions              Urethra:  normal appearing urethra with no masses, tenderness or lesions              Bartholins and Skenes: normal                 Vagina: normal appearing vagina with normal color and discharge, no lesions              Cervix: no lesions              Pap taken:  No. Bimanual Exam:  Uterus:  normal size, contour, position, consistency, mobility, non-tender              Adnexa: normal adnexa and no mass, fullness, tenderness               Rectovaginal: Confirms               Anus:  normal sphincter tone, no lesions  A:  Well Woman with normal exam PMP, no HRT Hypertension.  Off medications with normal BP since weight loss occurred. Elevated lipids Dysphagia issues this year with multiple tests.  Still seeing specialist, Dr. Carlton Adam, at [facility].  Next appt 12/15.  P:   Mammogram yearly.  D/W pt new american cancer society guidelines Pap 2014.  No pap today Aware tetanus shot due 2016 Follow up yearly or prn.  An After Visit Summary was printed and given to the patient.

## 2014-05-24 ENCOUNTER — Encounter: Payer: Self-pay | Admitting: Obstetrics & Gynecology

## 2014-07-23 HISTORY — PX: CATARACT EXTRACTION W/ INTRAOCULAR LENS  IMPLANT, BILATERAL: SHX1307

## 2014-09-17 ENCOUNTER — Telehealth: Payer: Self-pay | Admitting: Internal Medicine

## 2014-09-17 NOTE — Telephone Encounter (Signed)
error 

## 2014-09-20 ENCOUNTER — Ambulatory Visit: Payer: 59 | Admitting: Internal Medicine

## 2014-09-27 ENCOUNTER — Ambulatory Visit (INDEPENDENT_AMBULATORY_CARE_PROVIDER_SITE_OTHER): Payer: 59 | Admitting: Internal Medicine

## 2014-09-27 VITALS — BP 142/76 | HR 66 | Temp 98.0°F | Resp 16 | Wt 159.0 lb

## 2014-09-27 DIAGNOSIS — I10 Essential (primary) hypertension: Secondary | ICD-10-CM

## 2014-09-27 DIAGNOSIS — K219 Gastro-esophageal reflux disease without esophagitis: Secondary | ICD-10-CM

## 2014-09-27 NOTE — Progress Notes (Signed)
Pre visit review using our clinic review tool, if applicable. No additional management support is needed unless otherwise documented below in the visit note. 

## 2014-09-27 NOTE — Patient Instructions (Signed)
We do not need to do any blood work today and are not going to change the medicine. I would recommend staying on the prilosec in the future. If you need refills call the office or tell your pharmacy to call our office.   Come back in about 6 months and we will check the blood work then.   Exercise to Stay Healthy Exercise helps you become and stay healthy. EXERCISE IDEAS AND TIPS Choose exercises that:  You enjoy.  Fit into your day. You do not need to exercise really hard to be healthy. You can do exercises at a slow or medium level and stay healthy. You can:  Stretch before and after working out.  Try yoga, Pilates, or tai chi.  Lift weights.  Walk fast, swim, jog, run, climb stairs, bicycle, dance, or rollerskate.  Take aerobic classes. Exercises that burn about 150 calories:  Running 1  miles in 15 minutes.  Playing volleyball for 45 to 60 minutes.  Washing and waxing a car for 45 to 60 minutes.  Playing touch football for 45 minutes.  Walking 1  miles in 35 minutes.  Pushing a stroller 1  miles in 30 minutes.  Playing basketball for 30 minutes.  Raking leaves for 30 minutes.  Bicycling 5 miles in 30 minutes.  Walking 2 miles in 30 minutes.  Dancing for 30 minutes.  Shoveling snow for 15 minutes.  Swimming laps for 20 minutes.  Walking up stairs for 15 minutes.  Bicycling 4 miles in 15 minutes.  Gardening for 30 to 45 minutes.  Jumping rope for 15 minutes.  Washing windows or floors for 45 to 60 minutes. Document Released: 08/11/2010 Document Revised: 10/01/2011 Document Reviewed: 08/11/2010 [facility] Patient Information 2015 [place], [place]. This information is not intended to replace advice given to you by your health care provider. Make sure you discuss any questions you have with your health care provider.

## 2014-09-28 ENCOUNTER — Encounter: Payer: Self-pay | Admitting: Internal Medicine

## 2014-09-28 NOTE — Assessment & Plan Note (Signed)
Doing well on prilosec and will continue to avoid recurrence.

## 2014-09-28 NOTE — Progress Notes (Signed)
   Subjective:    Patient ID: Amy Williamson, female    DOB: 02/22/1951, 64 y.o.   MRN: [id number]  HPI The patient is a 64 YO female who is coming in to follow up on her dysphagia. It started suddenly about 1 year ago. It has gradually resolved with prilosec. She ended up having EGD and stretching of the esophagus with minimal change. She is now doing better and has only minimal problems with swallowing. She has continued to take the prilosec without side effects. Denies symptoms of GERD.   Review of Systems  Constitutional: Negative for fever, activity change, appetite change, fatigue and unexpected weight change.  HENT: Negative.   Eyes: Negative.   Respiratory: Negative for cough, chest tightness, shortness of breath and wheezing.   Cardiovascular: Negative for chest pain, palpitations and leg swelling.  Gastrointestinal: Negative for nausea, abdominal pain, diarrhea, constipation and abdominal distention.  Musculoskeletal: Negative.   Skin: Negative.   Neurological: Negative.   Psychiatric/Behavioral: Negative.       Objective:   Physical Exam  Constitutional: She is oriented to person, place, and time. She appears well-developed and well-nourished.  HENT:  Head: Normocephalic and atraumatic.  Eyes: EOM are normal.  Neck: Normal range of motion.  Cardiovascular: Normal rate and regular rhythm.   Pulmonary/Chest: Effort normal and breath sounds normal. No respiratory distress. She has no wheezes. She has no rales.  Abdominal: Soft. Bowel sounds are normal. She exhibits no distension. There is no tenderness. There is no rebound.  Neurological: She is alert and oriented to person, place, and time. Coordination normal.  Skin: Skin is warm and dry.  Psychiatric: She has a normal mood and affect.   Filed Vitals:   09/27/14 1427  BP: 142/76  Pulse: 66  Temp: 98 F (36.7 C)  TempSrc: Oral  Resp: 16  Weight: 159 lb (72.122 kg)  SpO2: 99%      Assessment & Plan:

## 2014-09-28 NOTE — Assessment & Plan Note (Signed)
BP controlled off medication, reviewed old BMP from 6 months ago. Will recheck every 6 months for recurrence. Advised that her weight loss had likely helped her pressures and to avoid gaining weight back.

## 2014-09-28 NOTE — Assessment & Plan Note (Signed)
Would recommend that she continue with the prilosec since it has helped to resolve her very significant dysphagia. No signs of GERD with daily usage.

## 2014-10-26 ENCOUNTER — Other Ambulatory Visit: Payer: Self-pay

## 2014-10-26 DIAGNOSIS — Z1231 Encounter for screening mammogram for malignant neoplasm of breast: Secondary | ICD-10-CM

## 2014-11-23 ENCOUNTER — Ambulatory Visit
Admission: RE | Admit: 2014-11-23 | Discharge: 2014-11-23 | Disposition: A | Discharge status: Home / Self Care | Payer: 59 | Source: Ambulatory Visit

## 2014-11-23 DIAGNOSIS — Z1231 Encounter for screening mammogram for malignant neoplasm of breast: Secondary | ICD-10-CM

## 2015-02-02 ENCOUNTER — Ambulatory Visit: Payer: Self-pay | Admitting: Internal Medicine

## 2015-03-30 ENCOUNTER — Encounter: Payer: Self-pay | Admitting: Internal Medicine

## 2015-03-30 ENCOUNTER — Other Ambulatory Visit (INDEPENDENT_AMBULATORY_CARE_PROVIDER_SITE_OTHER): Payer: Medicare Other

## 2015-03-30 ENCOUNTER — Ambulatory Visit (INDEPENDENT_AMBULATORY_CARE_PROVIDER_SITE_OTHER): Payer: Medicare Other | Admitting: Internal Medicine

## 2015-03-30 VITALS — BP 138/72 | HR 62 | Temp 97.9°F | Wt 159.2 lb

## 2015-03-30 DIAGNOSIS — R5383 Other fatigue: Secondary | ICD-10-CM

## 2015-03-30 DIAGNOSIS — Z23 Encounter for immunization: Secondary | ICD-10-CM | POA: Diagnosis not present

## 2015-03-30 DIAGNOSIS — K219 Gastro-esophageal reflux disease without esophagitis: Secondary | ICD-10-CM

## 2015-03-30 DIAGNOSIS — E789 Disorder of lipoprotein metabolism, unspecified: Secondary | ICD-10-CM

## 2015-03-30 DIAGNOSIS — I10 Essential (primary) hypertension: Secondary | ICD-10-CM

## 2015-03-30 LAB — COMPREHENSIVE METABOLIC PANEL
ALT: 14 U/L (ref 0–35)
AST: 16 U/L (ref 0–37)
Albumin: 4.2 g/dL (ref 3.5–5.2)
Alkaline Phosphatase: 79 U/L (ref 39–117)
BILIRUBIN TOTAL: 0.7 mg/dL (ref 0.2–1.2)
BUN: 23 mg/dL (ref 6–23)
CO2: 33 meq/L — AB (ref 19–32)
CREATININE: 0.72 mg/dL (ref 0.40–1.20)
Calcium: 9.8 mg/dL (ref 8.4–10.5)
Chloride: 104 mEq/L (ref 96–112)
GFR: 86.29 mL/min (ref >60.00)
GLUCOSE: 87 mg/dL (ref 70–99)
Potassium: 4.5 mEq/L (ref 3.5–5.1)
SODIUM: 142 meq/L (ref 135–145)
Total Protein: 6.6 g/dL (ref 6.0–8.3)

## 2015-03-30 LAB — LIPID PANEL
CHOL/HDL RATIO: 3
Cholesterol: 209 mg/dL — ABNORMAL HIGH (ref 0–200)
HDL: 60.3 mg/dL (ref >39.00)
LDL Cholesterol: 130 mg/dL — ABNORMAL HIGH (ref 0–99)
NONHDL: 148.28
TRIGLYCERIDES: 93 mg/dL (ref 0.0–149.0)
VLDL: 18.6 mg/dL (ref 0.0–40.0)

## 2015-03-30 LAB — TSH: TSH: 2.57 u[IU]/mL (ref 0.35–4.50)

## 2015-03-30 NOTE — Patient Instructions (Signed)
Think about coming back in for your wellness exam before your birthday next year.   We are checking your blood work today and will call you back about the results.   Good work with the exercise so keep up the good work.

## 2015-03-30 NOTE — Progress Notes (Addendum)
   Subjective:    Patient ID: Amy Williamson, female    DOB: [birth year], 64 y.o.   MRN: [id number]  HPI The patient is a 64 YO female coming in for follow up of her blood pressure. She was able to come off medications about 1.5 years ago with some weight loss. She is still keeping up with exercising about 5-6 times per week and doing 10K steps per day. Checks it intermittently at home and about the same or slightly less than reading today.   Review of Systems  Constitutional: Negative for fever, activity change, appetite change, fatigue and unexpected weight change.  HENT: Negative.   Eyes: Negative.   Respiratory: Negative for cough, chest tightness, shortness of breath and wheezing.   Cardiovascular: Negative for chest pain, palpitations and leg swelling.  Gastrointestinal: Negative for nausea, abdominal pain, diarrhea, constipation and abdominal distention.  Musculoskeletal: Negative.   Skin: Negative.   Neurological: Negative.   Psychiatric/Behavioral: Negative.       Objective:   Physical Exam  Constitutional: She is oriented to person, place, and time. She appears well-developed and well-nourished.  HENT:  Head: Normocephalic and atraumatic.  Eyes: EOM are normal.  Neck: Normal range of motion.  Cardiovascular: Normal rate and regular rhythm.   Pulmonary/Chest: Effort normal and breath sounds normal. No respiratory distress. She has no wheezes. She has no rales.  Abdominal: Soft. Bowel sounds are normal. She exhibits no distension. There is no tenderness. There is no rebound.  Neurological: She is alert and oriented to person, place, and time. Coordination normal.  Skin: Skin is warm and dry.  Psychiatric: She has a normal mood and affect.   Filed Vitals:   03/30/15 0813  BP: 138/72  Pulse: 62  Temp: 97.9 F (36.6 C)  Weight: 159 lb 4 oz (72.235 kg)  SpO2: 98%      Assessment & Plan:  High dose flu and prevnar 13 given at visit.

## 2015-03-30 NOTE — Assessment & Plan Note (Signed)
Is currently controlled off medication, BP at goal today of <140 and <90. She will keep up with the exercise and diet changes. Checking CMP today.

## 2015-03-30 NOTE — Assessment & Plan Note (Signed)
Stable and controlled about 90-95% of the time on her omeprazole. Still some random dysphagia but much better with regular usage.

## 2015-03-30 NOTE — Progress Notes (Signed)
Pre visit review using our clinic review tool, if applicable. No additional management support is needed unless otherwise documented below in the visit note. 

## 2015-04-18 ENCOUNTER — Ambulatory Visit (INDEPENDENT_AMBULATORY_CARE_PROVIDER_SITE_OTHER): Payer: Medicare Other | Admitting: Internal Medicine

## 2015-04-18 ENCOUNTER — Encounter: Payer: Self-pay | Admitting: Internal Medicine

## 2015-04-18 VITALS — BP 144/82 | HR 65 | Temp 98.7°F | Resp 18 | Wt 155.0 lb

## 2015-04-18 DIAGNOSIS — J309 Allergic rhinitis, unspecified: Secondary | ICD-10-CM

## 2015-04-18 DIAGNOSIS — J209 Acute bronchitis, unspecified: Secondary | ICD-10-CM

## 2015-04-18 DIAGNOSIS — R195 Other fecal abnormalities: Secondary | ICD-10-CM | POA: Diagnosis not present

## 2015-04-18 MED ORDER — AMOXICILLIN 500 MG PO CAPS: 500.0000 mg | ORAL_CAPSULE | Freq: Three times a day (TID) | ORAL | Status: DC

## 2015-04-18 NOTE — Progress Notes (Signed)
Pre visit review using our clinic review tool, if applicable. No additional management support is needed unless otherwise documented below in the visit note. 

## 2015-04-18 NOTE — Progress Notes (Signed)
   Subjective:    Patient ID: Amy Williamson, female    DOB: [birth year], 64 y.o.   MRN: [id number]  HPI Her symptoms began 04/14/15 as rhinitis with clear secretions. Last night these became yellow. She also began to cough up yellow sputum last night. There is a greater volume from the chest than from the head. She has associated scratchy throat over the last 3 days as well as hoarseness. She describes itchy, watery eyes during the same period. She had low-grade fever up to 99.5 today.  She has chronic blurred vision but relates this to recent cataract surgeries.  She also had some loose stool.  She's concerned about a mole on the left lateral calf as well as a spot on her back which is tender at times when she showers.   Review of Systems Frontal headache, facial pain , nasal purulence, dental pain, sore throat , otic pain or otic discharge denied. No chills or sweats.  Loose stools not associated with abdominal pain, melena, or rectal bleeding.  She has no associated genitourinary symptoms.     Objective:   Physical Exam   She is hoarse. There is erythema of the nasal mucosa. She has a granuloma over the left lateral calf. There is a slightly excoriated seborrheic keratosis lesion over the mid back.  General appearance:Adequately nourished; no acute distress or increased work of breathing is present.    Lymphatic: No  lymphadenopathy about the head, neck, or axilla .  Eyes: No conjunctival inflammation or lid edema is present. There is no scleral icterus.  Ears:  External ear exam shows no significant lesions or deformities.  Otoscopic examination reveals clear canals, tympanic membranes are intact bilaterally without bulging, retraction, inflammation or discharge.  Nose:  External nasal examination shows no deformity or inflammation. No septal dislocation or deviation.No obstruction to airflow.   Oral exam: Dental hygiene is good; lips and gums are healthy appearing.There is  no oropharyngeal erythema or exudate .  Neck:  No deformities, thyromegaly, masses, or tenderness noted.   Supple with full range of motion without pain.   Heart:  Normal rate and regular rhythm. S1 and S2 normal without gallop, murmur, click, rub or other extra sounds.   Lungs:Chest clear to auscultation; no wheezes, rhonchi,rales ,or rubs present.  Extremities:  No cyanosis, edema, or clubbing  noted    Skin: Warm & dry w/o tenting or jaundice. No significant lesions or rash.        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

## 2015-04-18 NOTE — Patient Instructions (Signed)
Plain Mucinex (NOT D) for thick secretions ;force NON dairy fluids .   Nasal cleansing in the shower as discussed with lather of mild shampoo.After 10 seconds wash off lather while  exhaling through nostrils. Make sure that all residual soap is removed to prevent irritation.  Flonase OR Nasacort AQ 1 spray in each nostril twice a day as needed. Use the "crossover" technique into opposite nostril spraying toward opposite ear @ 45 degree angle, not straight up into nostril.  Plain Allegra (NOT D )  160 daily , Loratidine 10 mg , OR Zyrtec 10 mg @ bedtime  as needed for itchy eyes & sneezing.   Use Eucerin or Aveeno Daily  Moisturizing Lotion  twice a day  for the skin lesions. Bathe with moisturizing liquid soap , not bar soap.Use your cell phone camera to monitor  the skin lesions . Take a photo of the skin lesions every 3 months with a small ruler immediately below the lesion to define any change in size, shape or color.   Please take a probiotic , Florastor OR Align, every day if the bowels are loose. This will replace the normal bacteria which  are necessary for formation of normal stool and processing of food.

## 2015-07-29 ENCOUNTER — Ambulatory Visit (INDEPENDENT_AMBULATORY_CARE_PROVIDER_SITE_OTHER): Payer: Medicare Other | Admitting: Family

## 2015-07-29 ENCOUNTER — Telehealth: Payer: Self-pay | Admitting: Internal Medicine

## 2015-07-29 ENCOUNTER — Encounter: Payer: Self-pay | Admitting: Family

## 2015-07-29 VITALS — BP 140/78 | HR 76 | Temp 98.3°F | Resp 14 | Ht 60.25 in | Wt 155.0 lb

## 2015-07-29 DIAGNOSIS — R350 Frequency of micturition: Secondary | ICD-10-CM | POA: Insufficient documentation

## 2015-07-29 LAB — POCT URINALYSIS DIPSTICK
BILIRUBIN UA: NEGATIVE
Blood, UA: NEGATIVE
GLUCOSE UA: NEGATIVE
Ketones, UA: NEGATIVE
LEUKOCYTES UA: NEGATIVE
NITRITE UA: NEGATIVE
Protein, UA: NEGATIVE
Spec Grav, UA: >=1.03
Urobilinogen, UA: NEGATIVE
pH, UA: 6

## 2015-07-29 MED ORDER — FLUCONAZOLE 150 MG PO TABS: 150.0000 mg | ORAL_TABLET | Freq: Once | ORAL | Status: DC

## 2015-07-29 NOTE — Telephone Encounter (Signed)
Called and scheduled. Patient made aware that this is ONLY for UTI

## 2015-07-29 NOTE — Telephone Encounter (Signed)
Patient is calling to advise that she thinks she has a UTI. She is requesting a UA be ordered. No availability today with any provider. Please advise.

## 2015-07-29 NOTE — Patient Instructions (Signed)
Thank you for choosing [facility].  Summary/Instructions:  Your prescription(s) have been submitted to your pharmacy or been printed and provided for you. Please take as directed and contact our office if you believe you are having problem(s) with the medication(s) or have any questions.  If your symptoms worsen or fail to improve, please contact our office for further instruction, or in case of emergency go directly to the emergency room at the closest medical facility.     

## 2015-07-29 NOTE — Progress Notes (Signed)
Pre visit review using our clinic review tool, if applicable. No additional management support is needed unless otherwise documented below in the visit note. 

## 2015-07-29 NOTE — Progress Notes (Signed)
   Subjective:    Patient ID: Amy Williamson, female    DOB: 07-Aug-1950, 65 y.o.   MRN: [id number]  Chief Complaint  Patient presents with  . Vaginal irritation    states that she is burning and feels irritated down in her vaginal area    HPI:  Amy Williamson is a 65 y.o. female who  has a past medical history of Unspecified essential hypertension; Palpitations; Unspecified venous (peripheral) insufficiency; Pure hypercholesterolemia; Obesity, unspecified; Depressive disorder, not elsewhere classified; History of pneumonia (05-14-11); Bronchitis (07-14-11); History of esophageal reflux; History of migraine headaches; Arthritis; Cataract immature (BILATERAL); and Dysphagia. and presents today for an acute office visit.  This is a new problem.  Associated symptoms of burning and irritation located in her vagina has been going on for about 3 days. Also notes increased urinary frequency with no vaginal discharge, fevers or chills. Modifying factors include an over the counter anti-yeast infection medication.There is mild lower abdominal pressure.   Allergies  Allergen Reactions  . Avelox [Moxifloxacin Hcl In Nacl]     Pt did not like side effects  . Azithromycin     ?causes muscle weakness, dysphagia     Current Outpatient Prescriptions on File Prior to Visit  Medication Sig Dispense Refill  . omeprazole (PRILOSEC) 20 MG capsule Take 20 mg by mouth daily.     No current facility-administered medications on file prior to visit.    Review of Systems  Constitutional: Negative for fever and chills.  Genitourinary: Positive for frequency. Negative for dysuria, urgency and hematuria.      Objective:    BP 140/78 mmHg  Pulse 76  Temp(Src) 98.3 F (36.8 C) (Oral)  Resp 14  Ht 5' 0.25" (1.53 m)  Wt 155 lb (70.308 kg)  BMI 30.03 kg/m2  SpO2 98%  LMP 07/23/2004 Nursing note and vital signs reviewed.  Physical Exam  Constitutional: She is oriented to person, place, and time. She  appears well-developed and well-nourished. No distress.  Cardiovascular: Normal rate, regular rhythm, normal heart sounds and intact distal pulses.   Pulmonary/Chest: Effort normal and breath sounds normal.  Neurological: She is alert and oriented to person, place, and time.  Skin: Skin is warm and dry.  Psychiatric: She has a normal mood and affect. Her behavior is normal. Judgment and thought content normal.       Assessment & Plan:   Problem List Items Addressed This Visit      Other   Urinary frequency - Primary    In office UA negative for leukocytes, nitrites, and hematuria. Urine sent for culture. Symptoms consistent with possible candidiasis. Start diflucan. Follow up with gynecology if symptoms worsen or do not improve.      Relevant Medications   fluconazole (DIFLUCAN) 150 MG tablet   Other Relevant Orders   Urine culture   POCT urinalysis dipstick (Completed)

## 2015-07-29 NOTE — Assessment & Plan Note (Signed)
In office UA negative for leukocytes, nitrites, and hematuria. Urine sent for culture. Symptoms consistent with possible candidiasis. Start diflucan. Follow up with gynecology if symptoms worsen or do not improve.

## 2015-07-29 NOTE — Telephone Encounter (Signed)
She can be added at 3:15 if she can do that.

## 2015-08-04 ENCOUNTER — Encounter: Payer: Self-pay | Admitting: Obstetrics & Gynecology

## 2015-08-04 ENCOUNTER — Ambulatory Visit (INDEPENDENT_AMBULATORY_CARE_PROVIDER_SITE_OTHER): Payer: Medicare Other | Admitting: Obstetrics & Gynecology

## 2015-08-04 VITALS — BP 140/70 | HR 70 | Resp 16 | Ht 60.0 in | Wt 156.0 lb

## 2015-08-04 DIAGNOSIS — Z23 Encounter for immunization: Secondary | ICD-10-CM | POA: Diagnosis not present

## 2015-08-04 DIAGNOSIS — Z01419 Encounter for gynecological examination (general) (routine) without abnormal findings: Secondary | ICD-10-CM | POA: Diagnosis not present

## 2015-08-04 DIAGNOSIS — Z124 Encounter for screening for malignant neoplasm of cervix: Secondary | ICD-10-CM

## 2015-08-04 DIAGNOSIS — R896 Abnormal cytological findings in specimens from other organs, systems and tissues: Secondary | ICD-10-CM

## 2015-08-04 DIAGNOSIS — Z202 Contact with and (suspected) exposure to infections with a predominantly sexual mode of transmission: Secondary | ICD-10-CM

## 2015-08-04 DIAGNOSIS — IMO0002 Reserved for concepts with insufficient information to code with codable children: Secondary | ICD-10-CM

## 2015-08-04 DIAGNOSIS — Z0000 Encounter for general adult medical examination without abnormal findings: Secondary | ICD-10-CM

## 2015-08-04 LAB — POCT URINALYSIS DIPSTICK
Bilirubin, UA: NEGATIVE
GLUCOSE UA: NEGATIVE
KETONES UA: NEGATIVE
Leukocytes, UA: NEGATIVE
Nitrite, UA: NEGATIVE
Protein, UA: NEGATIVE
RBC UA: NEGATIVE
UROBILINOGEN UA: NEGATIVE
pH, UA: 5

## 2015-08-04 LAB — HEPATITIS C ANTIBODY: HCV Ab: NEGATIVE

## 2015-08-04 MED ORDER — ESTROGENS, CONJUGATED 0.625 MG/GM VA CREA: TOPICAL_CREAM | VAGINAL | Status: DC

## 2015-08-04 NOTE — Progress Notes (Signed)
Patient was here for AEX with Dr. Sabra Heck.  She was advised it was time for her TDap and patient agreed for the administration.  I discussed with her the side effects to expect and to continue moving her arm around.  She should call if any concerns. I gave her the VIS.  Patient understood benefits and side effects.  The injection was give in the Left deltoid as the patient stated she was Right arm dominant.  She tolerated the injection well.  She had no questions or concerns and understand this is good for 10 years.//kg

## 2015-08-04 NOTE — Progress Notes (Addendum)
65 y.o. G2P2 WidowedCaucasianF here for annual exam.  Doing well.  Getting married January 29th.  Reports finacee has hx of Hep C that was treated and is cured.  Pt wants to know if there are any risks.  They have not been SA yet.  Pt is not sure "it will ever happen".   Pt reports she is having some vaginal burning.  Pt was tested for a UTI with PCP on Friday.  This was negative.  Pt treated with OTC monistat which helped symptoms a little bit but they are back to some extent.  No itching.  No discharge.    Patient's last menstrual period was 07/23/2004.          Sexually active: No.  The current method of family planning is abstinence and post menopausal status.    Exercising: Yes.    Walking Smoker:  no  Health Maintenance: Pap:  05/01/13 Neg History of abnormal Pap:  no MMG:  11/23/14 BIRADS1:neg Colonoscopy:  03/12/12 Normal f/u 10 years  BMD:   10/21/08 Normal.  Pt will do this with MMG this year. TDaP:  2006. Doing this today. Screening Labs: PCP, Hb today: PCP, Urine today: Negative    reports that she has never smoked. She has never used smokeless tobacco. She reports that she does not drink alcohol or use illicit drugs.  Past Medical History  Diagnosis Date  . Unspecified essential hypertension   . Palpitations   . Unspecified venous (peripheral) insufficiency   . Pure hypercholesterolemia   . Obesity, unspecified   . Depressive disorder, not elsewhere classified   . History of pneumonia 05-14-11  . Bronchitis 07-14-11  . History of esophageal reflux   . History of migraine headaches   . Arthritis     BACK AND SHOULDERS  . Cataract immature BILATERAL  . Dysphagia     Past Surgical History  Procedure Laterality Date  . Pulley release right trigger finger Right 01/08/2011  . Carpal tunnel release Right 2004  . Carpal tunnel release Left 2006  . Ear cyst excision  08/03/2011    patient states this is incorrect  . Knee arthroscopy w/ meniscal repair  08/03/2011    with  popliteal cyst excition  . Tubal ligation  1977    with second cesarean section  . Cesarean section  [id number]  . Trigger finger release Right 04/20/13  . Trigger finger release Left 05/06/14    Current Outpatient Prescriptions  Medication Sig Dispense Refill  . Ibuprofen 200 MG CAPS Take by mouth as needed.    [name] omeprazole (PRILOSEC) 20 MG capsule Take 20 mg by mouth daily.     No current facility-administered medications for this visit.    Family History  Problem Relation Age of Onset  . Heart failure Mother   . Liver cancer Brother   . Heart disease Father   . Clotting disorder Father   . Liver disease Maternal Aunt     x2  . Osteoporosis Mother   . Hyperlipidemia Mother   . Hypertension Mother   . Colon cancer Neg Hx     ROS:  Pertinent items are noted in HPI.  Otherwise, a comprehensive ROS was negative.  Exam:   BP 140/70 mmHg  Pulse 70  Resp 16  Ht 5' (1.524 m)  Wt 156 lb (70.761 kg)  BMI 30.47 kg/m2  LMP 07/23/2004  Weight change: +8#  Height: 5' (152.4 cm)  Ht Readings from Last 3 Encounters:  08/04/15 5' (  1.524 m)  07/29/15 5' 0.25" (1.53 m)  05/20/14 5' 0.25" (1.53 m)   General appearance: alert, cooperative and appears stated age Head: Normocephalic, without obvious abnormality, atraumatic Neck: no adenopathy, supple, symmetrical, trachea midline and thyroid normal to inspection and palpation Lungs: clear to auscultation bilaterally Breasts: normal appearance, no masses or tenderness Heart: regular rate and rhythm Abdomen: soft, non-tender; bowel sounds normal; no masses,  no organomegaly Extremities: extremities normal, atraumatic, no cyanosis or edema Skin: Skin color, texture, turgor normal. No rashes or lesions Lymph nodes: Cervical, supraclavicular, and axillary nodes normal. No abnormal inguinal nodes palpated Neurologic: Grossly normal   Pelvic: External genitalia:  no lesions              Urethra:  normal appearing urethra with no masses,  tenderness or lesions              Bartholins and Skenes: normal                 Vagina: normal appearing vagina with normal color and discharge, no lesions              Cervix: no lesions              Pap taken: Yes.   Bimanual Exam:  Uterus:  normal size, contour, position, consistency, mobility, non-tender              Adnexa: normal adnexa and no mass, fullness, tenderness               Rectovaginal: Confirms               Anus:  normal sphincter tone, no lesions  Wet smear:  Saline:  +WBC, no clue cells, no trich.  KOH:  No yeast, no whiff.  Ph: 4.5.  Chaperone was present for exam.  A:  Well Woman with normal exam PMP, no HRT Hypertension. Off medications with normal BP due to weight loss Mildly elevated LDLs Dysphagia issues that improved with weight loss and stopping supplements.  Saw Dr. Carlton Adam, at [facility], and was released this year but can be seen with new issues. Vaginal/urethral irritation and burning without discharge  P: Mammogram yearly. D/W pt new american cancer society guidelines Pap 2014.  Pap done today. Tdap today Checking Hep C and HIV today Premarin vaginal cream externally every other day for 14 days.  Then pt can use prn.  Pt will call with update. Follow up yearly or prn.

## 2015-08-04 NOTE — Addendum Note (Signed)
Addended by: Megan Salon on: 08/04/2015 12:52 PM   Modules accepted: Miquel Dunn

## 2015-08-04 NOTE — Patient Instructions (Signed)
Don't forget to schedule your bone density with you mammogram this year.  The mammogram is due in May at the [facility].

## 2015-08-05 LAB — HIV ANTIBODY (ROUTINE TESTING W REFLEX): HIV: NONREACTIVE

## 2015-08-08 LAB — IPS PAP SMEAR ONLY

## 2015-08-11 NOTE — Addendum Note (Signed)
Addended by: Megan Salon on: 08/11/2015 03:32 PM   Modules accepted: Orders, SmartSet

## 2015-08-15 ENCOUNTER — Telehealth: Payer: Self-pay | Admitting: Obstetrics & Gynecology

## 2015-08-15 NOTE — Telephone Encounter (Signed)
Called pt.  Informed that the pap was ASCUS but that a HR HPV test has been added.  Given her hx, I expect this to be normal.  However, it is appropriate to run the test and make sure that it is, indeed negative.  Advised she would be called with those results and recommendations would be made at that point.  Pt appreciate of phone call.  Ok to close encounter.

## 2015-08-15 NOTE — Telephone Encounter (Signed)
Patient called and said, "I just logged into MyChart and saw my Pap smear result was abnormal. I'd like to speak with someone today about this please."

## 2015-08-15 NOTE — Telephone Encounter (Signed)
Routing to [place] for review and advise of pap smear results from 08/04/2015. HPV on a liquid base added 08/15/2015.

## 2015-08-22 LAB — IPS HPV ON A LIQUID BASED SPECIMEN

## 2015-10-26 ENCOUNTER — Telehealth: Payer: Self-pay | Admitting: Obstetrics & Gynecology

## 2015-10-26 DIAGNOSIS — E2839 Other primary ovarian failure: Secondary | ICD-10-CM

## 2015-10-26 DIAGNOSIS — Z780 Asymptomatic menopausal state: Secondary | ICD-10-CM

## 2015-10-26 NOTE — Telephone Encounter (Signed)
Patient called [facility] to schedule her BMD and was told they need an order from our office. Last seen 08/04/15.

## 2015-10-26 NOTE — Telephone Encounter (Signed)
Spoke with patient. Advised order for [facility] has [facility] to [facility]. She is agreeable and will contact [facility] to schedule her mamm[facility] and BMD.  Routing to provider for final review. Patient agreeable to disposition. Will close encounter.

## 2015-10-28 ENCOUNTER — Other Ambulatory Visit: Payer: Self-pay

## 2015-10-28 DIAGNOSIS — Z1231 Encounter for screening mammogram for malignant neoplasm of breast: Secondary | ICD-10-CM

## 2015-11-01 ENCOUNTER — Ambulatory Visit: Payer: Medicare Other | Admitting: Family

## 2015-12-08 ENCOUNTER — Ambulatory Visit
Admission: RE | Admit: 2015-12-08 | Discharge: 2015-12-08 | Disposition: A | Discharge status: Home / Self Care | Payer: Medicare Other | Source: Ambulatory Visit | Attending: Obstetrics & Gynecology | Admitting: Obstetrics & Gynecology

## 2015-12-08 ENCOUNTER — Ambulatory Visit
Admission: RE | Admit: 2015-12-08 | Discharge: 2015-12-08 | Disposition: A | Discharge status: Home / Self Care | Payer: Medicare Other | Source: Ambulatory Visit

## 2015-12-08 DIAGNOSIS — E2839 Other primary ovarian failure: Secondary | ICD-10-CM

## 2015-12-08 DIAGNOSIS — Z1231 Encounter for screening mammogram for malignant neoplasm of breast: Secondary | ICD-10-CM

## 2015-12-08 DIAGNOSIS — Z780 Asymptomatic menopausal state: Secondary | ICD-10-CM

## 2016-02-01 ENCOUNTER — Ambulatory Visit (INDEPENDENT_AMBULATORY_CARE_PROVIDER_SITE_OTHER): Payer: Medicare Other | Admitting: Internal Medicine

## 2016-02-01 ENCOUNTER — Encounter: Payer: Self-pay | Admitting: Internal Medicine

## 2016-02-01 VITALS — BP 160/90 | HR 68 | Temp 98.3°F | Resp 16 | Ht 61.0 in | Wt 176.0 lb

## 2016-02-01 DIAGNOSIS — W19XXXA Unspecified fall, initial encounter: Secondary | ICD-10-CM | POA: Diagnosis not present

## 2016-02-01 DIAGNOSIS — R42 Dizziness and giddiness: Secondary | ICD-10-CM | POA: Diagnosis not present

## 2016-02-01 NOTE — Assessment & Plan Note (Signed)
Suspect mild vertigo s/p fall. Given exercises to do for her symptoms. Does not require medication at this time due to mild nature of symptoms. Given name of meclizine in case symptoms worsen.

## 2016-02-01 NOTE — Patient Instructions (Signed)
Meclizine is the medicine we talked about if the dizziness gets worse. It is over the counter and you can find it at most pharmacies.   You are doing well and do not need any more testing.

## 2016-02-01 NOTE — Progress Notes (Signed)
   Subjective:    Patient ID: Amy Williamson, female    DOB: 09/28/1950, 65 y.o.   MRN: [id number]  HPI The patient is a 65 YO female coming in for dizziness after fall. Sometimes room feels like it is spinning after standing. She did fall at a funeral on the lip of a handicapped ramp and hit her face and knees. She denies LOC. Mild headache that day and bump on her forehead. No serious injury and did not seek care at the time. She is recovered from that and no headaches or nausea or vomiting. She is just having mild dizziness which is gradually lessening. No repeat falls.   Review of Systems  Constitutional: Negative for fever, activity change, appetite change, fatigue and unexpected weight change.  HENT: Negative.   Eyes: Negative.   Respiratory: Negative for cough, chest tightness, shortness of breath and wheezing.   Cardiovascular: Negative for chest pain, palpitations and leg swelling.  Gastrointestinal: Negative for nausea, abdominal pain, diarrhea, constipation and abdominal distention.  Musculoskeletal: Negative.   Skin: Negative.   Neurological: Positive for dizziness. Negative for syncope, weakness and light-headedness.  Psychiatric/Behavioral: Negative.       Objective:   Physical Exam  Constitutional: She is oriented to person, place, and time. She appears well-developed and well-nourished.  HENT:  Head: Normocephalic and atraumatic.  No bruising or bumps left from fall and no tenderness  Eyes: EOM are normal.  Neck: Normal range of motion.  Cardiovascular: Normal rate and regular rhythm.   Pulmonary/Chest: Effort normal and breath sounds normal. No respiratory distress. She has no wheezes. She has no rales.  Abdominal: Soft. Bowel sounds are normal. She exhibits no distension. There is no tenderness. There is no rebound.  Neurological: She is alert and oriented to person, place, and time. Coordination normal.  No orthostatic hypotension in the office.   Skin: Skin is  warm and dry.  Psychiatric: She has a normal mood and affect.   Filed Vitals:   02/01/16 0830  BP: 160/90  Pulse: 68  Temp: 98.3 F (36.8 C)  TempSrc: Oral  Resp: 16  Height: 5\' 1"  (1.549 m)  Weight: 176 lb (79.833 kg)  SpO2: 98%      Assessment & Plan:

## 2016-02-01 NOTE — Assessment & Plan Note (Signed)
No serious injury and no residual injury on exam today. No indication for any imaging today.

## 2016-02-01 NOTE — Progress Notes (Signed)
Pre visit review using our clinic review tool, if applicable. No additional management support is needed unless otherwise documented below in the visit note. 

## 2016-02-14 ENCOUNTER — Encounter: Payer: Self-pay | Admitting: Certified Nurse Midwife

## 2016-02-14 ENCOUNTER — Telehealth: Payer: Self-pay | Admitting: Obstetrics & Gynecology

## 2016-02-14 ENCOUNTER — Ambulatory Visit (INDEPENDENT_AMBULATORY_CARE_PROVIDER_SITE_OTHER): Payer: Medicare Other | Admitting: Certified Nurse Midwife

## 2016-02-14 VITALS — BP 112/80 | HR 68 | Resp 16 | Ht 60.0 in | Wt 179.0 lb

## 2016-02-14 DIAGNOSIS — B372 Candidiasis of skin and nail: Secondary | ICD-10-CM | POA: Diagnosis not present

## 2016-02-14 MED ORDER — NYSTATIN-TRIAMCINOLONE 100000-0.1 UNIT/GM-% EX CREA: 1.0000 "application " | TOPICAL_CREAM | Freq: Two times a day (BID) | CUTANEOUS | 0 refills | Status: DC

## 2016-02-14 NOTE — Telephone Encounter (Signed)
Spoke with patient. Patient states that for 1 week she has been experiencing vaginal and rectal itching and burning. Denies any vaginal discharge. Reports she has a history of hemorrhoids and has been using preparation H suppositories with no relief. Has noticed blood when she wipes after a bowel movement. Denies noticing any vaginal bleeding. Advised she will need to be seen in the office for further evaluation. She is agreeable. Appointment scheduled for today 02/14/2016 at 11 am with Melvia Heaps CNM. She is agreeable to date, time, and to see another provider.  Routing to provider for final review. Patient agreeable to disposition. Will close encounter.

## 2016-02-14 NOTE — Telephone Encounter (Signed)
Patient is experiencing some itching and burning. Seeing blood after a bowel movement.

## 2016-02-14 NOTE — Patient Instructions (Addendum)
Monilial Vaginitis Vaginitis in a soreness, swelling and redness (inflammation) of the vagina and vulva. Monilial vaginitis is not a sexually transmitted infection. CAUSES  Yeast vaginitis is caused by yeast (candida) that is normally found in your vagina. With a yeast infection, the candida has overgrown in number to a point that upsets the chemical balance. SYMPTOMS   White, thick vaginal discharge.  Swelling, itching, redness and irritation of the vagina and possibly the lips of the vagina (vulva).  Burning or painful urination.  Painful intercourse. DIAGNOSIS  Things that may contribute to monilial vaginitis are:  Postmenopausal and virginal states.  Pregnancy.  Infections.  Being tired, sick or stressed, especially if you had monilial vaginitis in the past.  Diabetes. Good control will help lower the chance.  Birth control pills.  Tight fitting garments.  Using bubble bath, feminine sprays, douches or deodorant tampons.  Taking certain medications that kill germs (antibiotics).  Sporadic recurrence can occur if you become ill. TREATMENT  Your caregiver will give you medication.  There are several kinds of anti monilial vaginal creams and suppositories specific for monilial vaginitis. For recurrent yeast infections, use a suppository or cream in the vagina 2 times a week, or as directed.  Anti-monilial or steroid cream for the itching or irritation of the vulva may also be used. Get your caregiver's permission.  Painting the vagina with methylene blue solution may help if the monilial cream does not work.  Eating yogurt may help prevent monilial vaginitis. HOME CARE INSTRUCTIONS   Finish all medication as prescribed.  Do not have sex until treatment is completed or after your caregiver tells you it is okay.  Take warm sitz baths.  Do not douche.  Do not use tampons, especially scented ones.  Wear cotton underwear.  Avoid tight pants and panty  hose.  Tell your sexual partner that you have a yeast infection. They should go to their caregiver if they have symptoms such as mild rash or itching.  Your sexual partner should be treated as well if your infection is difficult to eliminate.  Practice safer sex. Use condoms.  Some vaginal medications cause latex condoms to fail. Vaginal medications that harm condoms are:  Cleocin cream.  Butoconazole (Femstat).  Terconazole (Terazol) vaginal suppository.  Miconazole (Monistat) (may be purchased over the counter). SEEK MEDICAL CARE IF:   You have a temperature by mouth above 102 F (38.9 C).  The infection is getting worse after 2 days of treatment.  The infection is not getting better after 3 days of treatment.  You develop blisters in or around your vagina.  You develop vaginal bleeding, and it is not your menstrual period.  You have pain when you urinate.  You develop intestinal problems.  You have pain with sexual intercourse.   This information is not intended to replace advice given to you by your health care provider. Make sure you discuss any questions you have with your health care provider.   Document Released: 04/18/2005 Document Revised: 10/01/2011 Document Reviewed: 01/10/2015 Elsevier Interactive Patient Education 2016 [employer] Cutaneous Candidiasis Cutaneous candidiasis is a condition in which there is an overgrowth of yeast (candida) on the skin. Yeast normally live on the skin, but in small enough numbers not to cause any symptoms. In certain cases, increased growth of the yeast may cause an actual yeast infection. This kind of infection usually occurs in areas of the skin that are constantly warm and moist, such as the armpits or the groin. Yeast  is the most common cause of diaper rash in babies and in people who cannot control their bowel movements (incontinence). CAUSES  The fungus that most often causes cutaneous candidiasis is Candida  albicans. Conditions that can increase the risk of getting a yeast infection of the skin include:  Obesity.  Pregnancy.  Diabetes.  Taking antibiotic medicine.  Taking birth control pills.  Taking steroid medicines.  Thyroid disease.  An iron or zinc deficiency.  Problems with the immune system. SYMPTOMS   Red, swollen area of the skin.  Bumps on the skin.  Itchiness. DIAGNOSIS  The diagnosis of cutaneous candidiasis is usually based on its appearance. Light scrapings of the skin may also be taken and viewed under a microscope to identify the presence of yeast. TREATMENT  Antifungal creams may be applied to the infected skin. In severe cases, oral medicines may be needed.  HOME CARE INSTRUCTIONS   Keep your skin clean and dry.  Maintain a healthy weight.  If you have diabetes, keep your blood sugar under control. SEEK IMMEDIATE MEDICAL CARE IF:  Your rash continues to spread despite treatment.  You have a fever, chills, or abdominal pain.   This information is not intended to replace advice given to you by your health care provider. Make sure you discuss any questions you have with your health care provider.   Document Released: 03/27/2011 Document Revised: 10/01/2011 Document Reviewed: 01/10/2015 Elsevier Interactive Patient Education 2016 [place] bath for comfort. Pure coconut oil for use.

## 2016-02-14 NOTE — Progress Notes (Signed)
65 y.o. Married Caucasian female G2P2 here with complaint of vaginal symptoms of itching, burning only and discomfort with bowel movement touching skin. Also having burning when urine touches skin only. Describes discharge as minimal, no odor.Onset of symptoms 12 days ago. Denies new personal products except vaginal lubricant. No STD concerns. Urinary symptoms none . Menopausal no HRT. Recently married and spouse has ED and is being treated for, but nothing working. "so more touching and feeling now". Using KY jelly as needed, but no change. No devices. Patient also has been "sweating a lot in pelvic area", going without underwear when possible.. No other health issues today.   O:Healthy female WDWN Affect: normal, orientation x 3  Exam: Abdomen:soft non tender, negative suprapubic, under abdomen slight pink moist macular rash noted, non tender, slight exudate, no odor  Inguinal Lymph nodes: no enlargement or tenderness Pelvic exam: External genital: normal female, with increase pink in total vulva area with exudate, scaling and light superficial cracks in skin, slightly tender to touch wet prep taken, also same noted in groin area on left BUS: negative Bladder and urethral meatus non tender Vagina: scant non odorous watery  discharge noted.ph 4.0 Ph:4.0   ,Wet prep taken, Affirm taken,  Cervix: normal, non tender, no CMT Uterus: normal, non tender Adnexa:normal, non tender, no masses or fullness noted Perineal area to rectum slightly increase pink with superficial cracking of skin noted on skin . No active bleeding, but slightly excoriated area noted close to anal opening. No blood from rectum noted or hemorrhoids. Wet Prep results: positive for yeast on skin and ? Yeast in vagina with KOH and saline used   A:Normal pelvic exam Yeast dermatitis R/O vaginal infection Vaginal dryness?   P:Discussed findings of yeast dermatitis and etiology. Discussed Aveeno sitz bath for comfort. Avoid moist  clothes  for extended period of time. If working out in gym clothes or swim suits for long periods of time change underwear or bottoms of swimsuit if possible. Coconut Oil use for skin protection prior to activity can be used to external skin for protection or dryness, after external skin has healed. Rx: Mycolog cream see order with instructions Will treat per affirm if indicated Discussed vaginal dryness can also increase vaginal discomfort. Discussed coconut trial after all clear for sexual activity and moisture.  Rv prn or no change

## 2016-02-15 LAB — WET PREP BY MOLECULAR PROBE
Candida species: NEGATIVE
GARDNERELLA VAGINALIS: NEGATIVE
Trichomonas vaginosis: NEGATIVE

## 2016-02-15 NOTE — Progress Notes (Signed)
Encounter reviewed Billye Pickerel, MD   

## 2016-04-02 ENCOUNTER — Other Ambulatory Visit (INDEPENDENT_AMBULATORY_CARE_PROVIDER_SITE_OTHER): Payer: Medicare Other

## 2016-04-02 ENCOUNTER — Ambulatory Visit (INDEPENDENT_AMBULATORY_CARE_PROVIDER_SITE_OTHER): Payer: Medicare Other | Admitting: Internal Medicine

## 2016-04-02 ENCOUNTER — Encounter: Payer: Self-pay | Admitting: Internal Medicine

## 2016-04-02 VITALS — BP 132/80 | HR 64 | Temp 98.4°F | Resp 14 | Ht 61.0 in | Wt 184.0 lb

## 2016-04-02 DIAGNOSIS — Z0001 Encounter for general adult medical examination with abnormal findings: Secondary | ICD-10-CM | POA: Insufficient documentation

## 2016-04-02 DIAGNOSIS — Z23 Encounter for immunization: Secondary | ICD-10-CM | POA: Diagnosis not present

## 2016-04-02 DIAGNOSIS — R1319 Other dysphagia: Secondary | ICD-10-CM | POA: Diagnosis not present

## 2016-04-02 DIAGNOSIS — K219 Gastro-esophageal reflux disease without esophagitis: Secondary | ICD-10-CM

## 2016-04-02 DIAGNOSIS — E7801 Familial hypercholesterolemia: Secondary | ICD-10-CM

## 2016-04-02 DIAGNOSIS — E7800 Pure hypercholesterolemia, unspecified: Secondary | ICD-10-CM | POA: Diagnosis not present

## 2016-04-02 DIAGNOSIS — Z0000 Encounter for general adult medical examination without abnormal findings: Secondary | ICD-10-CM | POA: Diagnosis not present

## 2016-04-02 DIAGNOSIS — Z01810 Encounter for preprocedural cardiovascular examination: Secondary | ICD-10-CM | POA: Insufficient documentation

## 2016-04-02 LAB — CBC
HCT: 38.5 % (ref 36.0–46.0)
Hemoglobin: 13.3 g/dL (ref 12.0–15.0)
MCHC: 34.6 g/dL (ref 30.0–36.0)
MCV: 82.7 fl (ref 78.0–100.0)
PLATELETS: 181 10*3/uL (ref 150.0–400.0)
RBC: 4.66 Mil/uL (ref 3.87–5.11)
RDW: 13.9 % (ref 11.5–15.5)
WBC: 6.5 10*3/uL (ref 4.0–10.5)

## 2016-04-02 LAB — COMPREHENSIVE METABOLIC PANEL
ALBUMIN: 4.3 g/dL (ref 3.5–5.2)
ALK PHOS: 71 U/L (ref 39–117)
ALT: 13 U/L (ref 0–35)
AST: 14 U/L (ref 0–37)
BILIRUBIN TOTAL: 0.6 mg/dL (ref 0.2–1.2)
BUN: 21 mg/dL (ref 6–23)
CALCIUM: 9.5 mg/dL (ref 8.4–10.5)
CO2: 32 meq/L (ref 19–32)
Chloride: 102 mEq/L (ref 96–112)
Creatinine, Ser: 0.72 mg/dL (ref 0.40–1.20)
GFR: 86.02 mL/min (ref >60.00)
Glucose, Bld: 100 mg/dL — ABNORMAL HIGH (ref 70–99)
Potassium: 4.5 mEq/L (ref 3.5–5.1)
Sodium: 140 mEq/L (ref 135–145)
Total Protein: 6.8 g/dL (ref 6.0–8.3)

## 2016-04-02 LAB — LIPID PANEL
CHOL/HDL RATIO: 3
CHOLESTEROL: 232 mg/dL — AB (ref 0–200)
HDL: 69.3 mg/dL (ref >39.00)
LDL CALC: 147 mg/dL — AB (ref 0–99)
NonHDL: 162.51
TRIGLYCERIDES: 76 mg/dL (ref 0.0–149.0)
VLDL: 15.2 mg/dL (ref 0.0–40.0)

## 2016-04-02 NOTE — Progress Notes (Signed)
Pre visit review using our clinic review tool, if applicable. No additional management support is needed unless otherwise documented below in the visit note. 

## 2016-04-02 NOTE — Progress Notes (Signed)
   Subjective:    Patient ID: Amy Williamson, female    DOB: May 27, 1951, 65 y.o.   MRN: [id number]  HPI Here for medicare wellness and CPE, no new complaints. Please see A/P for status and treatment of chronic medical problems.   Diet: heart healthy Physical activity: sedentary, does yard work Depression/mood screen: negative Hearing: intact to whispered voice Visual acuity: grossly normal with lens, performs annual eye exam  ADLs: capable Fall risk: low Home safety: good Cognitive evaluation: intact to orientation, naming, recall and repetition EOL planning: adv directives discussed  I have personally reviewed and have noted 1. The patient's medical and social history - reviewed today no changes 2. Their use of alcohol, tobacco or illicit drugs 3. Their current medications and supplements 4. The patient's functional ability including ADL's, fall risks, home safety risks and hearing or visual impairment. 5. Diet and physical activities 6. Evidence for depression or mood disorders 7. Care team reviewed and updated (available in snapshot)  Review of Systems  Constitutional: Negative for activity change, appetite change, fatigue, fever and unexpected weight change.  HENT: Negative.   Eyes: Negative.   Respiratory: Negative for cough, chest tightness, shortness of breath and wheezing.   Cardiovascular: Negative for chest pain, palpitations and leg swelling.  Gastrointestinal: Negative for abdominal distention, abdominal pain, constipation, diarrhea and nausea.  Musculoskeletal: Positive for arthralgias. Negative for back pain, gait problem and myalgias.  Skin: Negative.   Neurological: Negative.   Psychiatric/Behavioral: Negative.       Objective:   Physical Exam  Constitutional: She is oriented to person, place, and time. She appears well-developed and well-nourished.  HENT:  Head: Normocephalic and atraumatic.  Eyes: EOM are normal.  Neck: Normal range of motion.    Cardiovascular: Normal rate and regular rhythm.   Pulmonary/Chest: Effort normal and breath sounds normal. No respiratory distress. She has no wheezes. She has no rales.  Abdominal: Soft. Bowel sounds are normal. She exhibits no distension. There is no tenderness. There is no rebound.  Musculoskeletal: She exhibits no edema.  Neurological: She is alert and oriented to person, place, and time. Coordination normal.  Skin: Skin is warm and dry.  Psychiatric: She has a normal mood and affect.   Vitals:   04/02/16 0902  BP: 132/80  Pulse: 64  Resp: 14  Temp: 98.4 F (36.9 C)  TempSrc: Oral  SpO2: 98%  Weight: 184 lb (83.5 kg)  Height: 5\' 1"  (1.549 m)      Assessment & Plan:  Flu shot given at visit

## 2016-04-02 NOTE — Assessment & Plan Note (Signed)
Doing well and no problems on her PPI.

## 2016-04-02 NOTE — Patient Instructions (Signed)
We are checking the labs today and will call you back with the results.   Don't forget to use sun screen or clothes for protection when outside doing yardwork.  Health Maintenance, Female Adopting a healthy lifestyle and getting preventive care can go a long way to promote health and wellness. Talk with your health care provider about what schedule of regular examinations is right for you. This is a good chance for you to check in with your provider about disease prevention and staying healthy. In between checkups, there are plenty of things you can do on your own. Experts have done a lot of research about which lifestyle changes and preventive measures are most likely to keep you healthy. Ask your health care provider for more information. WEIGHT AND DIET  Eat a healthy diet  Be sure to include plenty of vegetables, fruits, low-fat dairy products, and lean protein.  Do not eat a lot of foods high in solid fats, added sugars, or salt.  Get regular exercise. This is one of the most important things you can do for your health.  Most adults should exercise for at least 150 minutes each week. The exercise should increase your heart rate and make you sweat (moderate-intensity exercise).  Most adults should also do strengthening exercises at least twice a week. This is in addition to the moderate-intensity exercise.  Maintain a healthy weight  Body mass index (BMI) is a measurement that can be used to identify possible weight problems. It estimates body fat based on height and weight. Your health care provider can help determine your BMI and help you achieve or maintain a healthy weight.  For females 52 years of age and older:   A BMI below 18.5 is considered underweight.  A BMI of 18.5 to 24.9 is normal.  A BMI of 25 to 29.9 is considered overweight.  A BMI of 30 and above is considered obese.  Watch levels of cholesterol and blood lipids  You should start having your blood tested for  lipids and cholesterol at 66 years of age, then have this test every 5 years.  You may need to have your cholesterol levels checked more often if:  Your lipid or cholesterol levels are high.  You are older than 66 years of age.  You are at high risk for heart disease.  CANCER SCREENING   Lung Cancer  Lung cancer screening is recommended for adults 63-5 years old who are at high risk for lung cancer because of a history of smoking.  A yearly low-dose CT scan of the lungs is recommended for people who:  Currently smoke.  Have quit within the past 15 years.  Have at least a 30-pack-year history of smoking. A pack year is smoking an average of one pack of cigarettes a day for 1 year.  Yearly screening should continue until it has been 15 years since you quit.  Yearly screening should stop if you develop a health problem that would prevent you from having lung cancer treatment.  Breast Cancer  Practice breast self-awareness. This means understanding how your breasts normally appear and feel.  It also means doing regular breast self-exams. Let your health care provider know about any changes, no matter how small.  If you are in your 20s or 30s, you should have a clinical breast exam (CBE) by a health care provider every 1-3 years as part of a regular health exam.  If you are 9 or older, have a CBE every year.  Also consider having a breast X-ray (mammogram) every year.  If you have a family history of breast cancer, talk to your health care provider about genetic screening.  If you are at high risk for breast cancer, talk to your health care provider about having an MRI and a mammogram every year.  Breast cancer gene (BRCA) assessment is recommended for women who have family members with BRCA-related cancers. BRCA-related cancers include:  Breast.  Ovarian.  Tubal.  Peritoneal cancers.  Results of the assessment will determine the need for genetic counseling and BRCA1  and BRCA2 testing. Cervical Cancer Your health care provider may recommend that you be screened regularly for cancer of the pelvic organs (ovaries, uterus, and vagina). This screening involves a pelvic examination, including checking for microscopic changes to the surface of your cervix (Pap test). You may be encouraged to have this screening done every 3 years, beginning at age 20.  For women ages 5-65, health care providers may recommend pelvic exams and Pap testing every 3 years, or they may recommend the Pap and pelvic exam, combined with testing for human papilloma virus (HPV), every 5 years. Some types of HPV increase your risk of cervical cancer. Testing for HPV may also be done on women of any age with unclear Pap test results.  Other health care providers may not recommend any screening for nonpregnant women who are considered low risk for pelvic cancer and who do not have symptoms. Ask your health care provider if a screening pelvic exam is right for you.  If you have had past treatment for cervical cancer or a condition that could lead to cancer, you need Pap tests and screening for cancer for at least 20 years after your treatment. If Pap tests have been discontinued, your risk factors (such as having a new sexual partner) need to be reassessed to determine if screening should resume. Some women have medical problems that increase the chance of getting cervical cancer. In these cases, your health care provider may recommend more frequent screening and Pap tests. Colorectal Cancer  This type of cancer can be detected and often prevented.  Routine colorectal cancer screening usually begins at 66 years of age and continues through 66 years of age.  Your health care provider may recommend screening at an earlier age if you have risk factors for colon cancer.  Your health care provider may also recommend using home test kits to check for hidden blood in the stool.  A small camera at the  end of a tube can be used to examine your colon directly (sigmoidoscopy or colonoscopy). This is done to check for the earliest forms of colorectal cancer.  Routine screening usually begins at age 74.  Direct examination of the colon should be repeated every 5-10 years through 65 years of age. However, you may need to be screened more often if early forms of precancerous polyps or small growths are found. Skin Cancer  Check your skin from head to toe regularly.  Tell your health care provider about any new moles or changes in moles, especially if there is a change in a mole's shape or color.  Also tell your health care provider if you have a mole that is larger than the size of a pencil eraser.  Always use sunscreen. Apply sunscreen liberally and repeatedly throughout the day.  Protect yourself by wearing long sleeves, pants, a wide-brimmed hat, and sunglasses whenever you are outside. HEART DISEASE, DIABETES, AND HIGH BLOOD PRESSURE  High blood pressure causes heart disease and increases the risk of stroke. High blood pressure is more likely to develop in:  People who have blood pressure in the high end of the normal range (130-139/85-89 mm Hg).  People who are overweight or obese.  People who are African American.  If you are 18-39 years of age, have your blood pressure checked every 3-5 years. If you are 40 years of age or older, have your blood pressure checked every year. You should have your blood pressure measured twice--once when you are at a hospital or clinic, and once when you are not at a hospital or clinic. Record the average of the two measurements. To check your blood pressure when you are not at a hospital or clinic, you can use:  An automated blood pressure machine at a pharmacy.  A home blood pressure monitor.  If you are between 55 years and 79 years old, ask your health care provider if you should take aspirin to prevent strokes.  Have regular diabetes  screenings. This involves taking a blood sample to check your fasting blood sugar level.  If you are at a normal weight and have a low risk for diabetes, have this test once every three years after 66 years of age.  If you are overweight and have a high risk for diabetes, consider being tested at a younger age or more often. PREVENTING INFECTION  Hepatitis B  If you have a higher risk for hepatitis B, you should be screened for this virus. You are considered at high risk for hepatitis B if:  You were born in a country where hepatitis B is common. Ask your health care provider which countries are considered high risk.  Your parents were born in a high-risk country, and you have not been immunized against hepatitis B (hepatitis B vaccine).  You have HIV or AIDS.  You use needles to inject street drugs.  You live with someone who has hepatitis B.  You have had sex with someone who has hepatitis B.  You get hemodialysis treatment.  You take certain medicines for conditions, including cancer, organ transplantation, and autoimmune conditions. Hepatitis C  Blood testing is recommended for:  Everyone born from 1945 through 1965.  Anyone with known risk factors for hepatitis C. Sexually transmitted infections (STIs)  You should be screened for sexually transmitted infections (STIs) including gonorrhea and chlamydia if:  You are sexually active and are younger than 66 years of age.  You are older than 66 years of age and your health care provider tells you that you are at risk for this type of infection.  Your sexual activity has changed since you were last screened and you are at an increased risk for chlamydia or gonorrhea. Ask your health care provider if you are at risk.  If you do not have HIV, but are at risk, it may be recommended that you take a prescription medicine daily to prevent HIV infection. This is called pre-exposure prophylaxis (PrEP). You are considered at risk  if:  You are sexually active and do not regularly use condoms or know the HIV status of your partner(s).  You take drugs by injection.  You are sexually active with a partner who has HIV. Talk with your health care provider about whether you are at high risk of being infected with HIV. If you choose to begin PrEP, you should first be tested for HIV. You should then be tested every 3 months for as   long as you are taking PrEP.  PREGNANCY   If you are premenopausal and you may become pregnant, ask your health care provider about preconception counseling.  If you may become pregnant, take 400 to 800 micrograms (mcg) of folic acid every day.  If you want to prevent pregnancy, talk to your health care provider about birth control (contraception). OSTEOPOROSIS AND MENOPAUSE   Osteoporosis is a disease in which the bones lose minerals and strength with aging. This can result in serious bone fractures. Your risk for osteoporosis can be identified using a bone density scan.  If you are 30 years of age or older, or if you are at risk for osteoporosis and fractures, ask your health care provider if you should be screened.  Ask your health care provider whether you should take a calcium or vitamin D supplement to lower your risk for osteoporosis.  Menopause may have certain physical symptoms and risks.  Hormone replacement therapy may reduce some of these symptoms and risks. Talk to your health care provider about whether hormone replacement therapy is right for you.  HOME CARE INSTRUCTIONS   Schedule regular health, dental, and eye exams.  Stay current with your immunizations.   Do not use any tobacco products including cigarettes, chewing tobacco, or electronic cigarettes.  If you are pregnant, do not drink alcohol.  If you are breastfeeding, limit how much and how often you drink alcohol.  Limit alcohol intake to no more than 1 drink per day for nonpregnant women. One drink equals 12  ounces of beer, 5 ounces of wine, or 1 ounces of hard liquor.  Do not use street drugs.  Do not share needles.  Ask your health care provider for help if you need support or information about quitting drugs.  Tell your health care provider if you often feel depressed.  Tell your health care provider if you have ever been abused or do not feel safe at home.   This information is not intended to replace advice given to you by your health care provider. Make sure you discuss any questions you have with your health care provider.   Document Released: 01/22/2011 Document Revised: 07/30/2014 Document Reviewed: 06/10/2013 Elsevier Interactive Patient Education [employer].

## 2016-04-02 NOTE — Assessment & Plan Note (Signed)
Colonoscopy up to date, aged out of pap smears. Mammogram up to date. Tetanus up to date and shingles done. Given flu shot today. Counseled about sun safety and mole surveillance. Given 10 year screening recommendations.

## 2016-04-02 NOTE — Assessment & Plan Note (Signed)
Taking PPI and well controlled.

## 2016-04-02 NOTE — Assessment & Plan Note (Signed)
Checking lipid panel and adjust as needed. Not on meds right now.

## 2016-06-11 ENCOUNTER — Encounter: Payer: Self-pay | Admitting: Internal Medicine

## 2016-06-11 ENCOUNTER — Ambulatory Visit (INDEPENDENT_AMBULATORY_CARE_PROVIDER_SITE_OTHER): Payer: Medicare Other | Admitting: Internal Medicine

## 2016-06-11 DIAGNOSIS — R062 Wheezing: Secondary | ICD-10-CM | POA: Diagnosis not present

## 2016-06-11 MED ORDER — DOXYCYCLINE HYCLATE 100 MG PO TABS: 100.0000 mg | ORAL_TABLET | Freq: Two times a day (BID) | ORAL | 0 refills | Status: DC

## 2016-06-11 MED ORDER — PREDNISONE 20 MG PO TABS: 40.0000 mg | ORAL_TABLET | Freq: Every day | ORAL | 0 refills | Status: DC

## 2016-06-11 MED ORDER — TRIAMCINOLONE ACETONIDE 0.1 % MT PSTE: 1.0000 "application " | PASTE | Freq: Two times a day (BID) | OROMUCOSAL | 12 refills | Status: DC

## 2016-06-11 NOTE — Assessment & Plan Note (Signed)
Rx for prednisone and for doxycycline and instructed to start in 3-4 days if symptoms are not improving.

## 2016-06-11 NOTE — Patient Instructions (Signed)
I have sent in the triamcinolone mouth paste that you can use daily on the mouth.   We have sent in prednisone to take starting today. Take 2 pills daily for 5 days.   Starting Tuesday or Wednesday if you are not feeling better start taking the antibiotic doxycycline. Take 1 pill twice a day until gone.    Upper Respiratory Infection, Adult Most upper respiratory infections (URIs) are a viral infection of the air passages leading to the lungs. A URI affects the nose, throat, and upper air passages. The most common type of URI is nasopharyngitis and is typically referred to as "the common cold." URIs run their course and usually go away on their own. Most of the time, a URI does not require medical attention, but sometimes a bacterial infection in the upper airways can follow a viral infection. This is called a secondary infection. Sinus and middle ear infections are common types of secondary upper respiratory infections. Bacterial pneumonia can also complicate a URI. A URI can worsen asthma and chronic obstructive pulmonary disease (COPD). Sometimes, these complications can require emergency medical care and may be life threatening. What are the causes? Almost all URIs are caused by viruses. A virus is a type of germ and can spread from one person to another. What increases the risk? You may be at risk for a URI if:  You smoke.  You have chronic heart or lung disease.  You have a weakened defense (immune) system.  You are very young or very old.  You have nasal allergies or asthma.  You work in crowded or poorly ventilated areas.  You work in health care facilities or schools. What are the signs or symptoms? Symptoms typically develop 2-3 days after you come in contact with a cold virus. Most viral URIs last 7-10 days. However, viral URIs from the influenza virus (flu virus) can last 14-18 days and are typically more severe. Symptoms may include:  Runny or stuffy (congested)  nose.  Sneezing.  Cough.  Sore throat.  Headache.  Fatigue.  Fever.  Loss of appetite.  Pain in your forehead, behind your eyes, and over your cheekbones (sinus pain).  Muscle aches. How is this diagnosed? Your health care provider may diagnose a URI by:  Physical exam.  Tests to check that your symptoms are not due to another condition such as:  Strep throat.  Sinusitis.  Pneumonia.  Asthma. How is this treated? A URI goes away on its own with time. It cannot be cured with medicines, but medicines may be prescribed or recommended to relieve symptoms. Medicines may help:  Reduce your fever.  Reduce your cough.  Relieve nasal congestion. Follow these instructions at home:  Take medicines only as directed by your health care provider.  Gargle warm saltwater or take cough drops to comfort your throat as directed by your health care provider.  Use a warm mist humidifier or inhale steam from a shower to increase air moisture. This may make it easier to breathe.  Drink enough fluid to keep your urine clear or pale yellow.  Eat soups and other clear broths and maintain good nutrition.  Rest as needed.  Return to work when your temperature has returned to normal or as your health care provider advises. You may need to stay home longer to avoid infecting others. You can also use a face mask and careful hand washing to prevent spread of the virus.  Increase the usage of your inhaler if you have  asthma.  Do not use any tobacco products, including cigarettes, chewing tobacco, or electronic cigarettes. If you need help quitting, ask your health care provider. How is this prevented? The best way to protect yourself from getting a cold is to practice good hygiene.  Avoid oral or hand contact with people with cold symptoms.  Wash your hands often if contact occurs. There is no clear evidence that vitamin C, vitamin E, echinacea, or exercise reduces the chance of  developing a cold. However, it is always recommended to get plenty of rest, exercise, and practice good nutrition. Contact a health care provider if:  You are getting worse rather than better.  Your symptoms are not controlled by medicine.  You have chills.  You have worsening shortness of breath.  You have brown or red mucus.  You have yellow or brown nasal discharge.  You have pain in your face, especially when you bend forward.  You have a fever.  You have swollen neck glands.  You have pain while swallowing.  You have white areas in the back of your throat. Get help right away if:  You have severe or persistent:  Headache.  Ear pain.  Sinus pain.  Chest pain.  You have chronic lung disease and any of the following:  Wheezing.  Prolonged cough.  Coughing up blood.  A change in your usual mucus.  You have a stiff neck.  You have changes in your:  Vision.  Hearing.  Thinking.  Mood. This information is not intended to replace advice given to you by your health care provider. Make sure you discuss any questions you have with your health care provider. Document Released: 01/02/2001 Document Revised: 03/11/2016 Document Reviewed: 10/14/2013 Elsevier Interactive Patient Education  2017 [employer].

## 2016-06-11 NOTE — Progress Notes (Signed)
Pre visit review using our clinic review tool, if applicable. No additional management support is needed unless otherwise documented below in the visit note. 

## 2016-06-11 NOTE — Progress Notes (Signed)
   Subjective:    Patient ID: Amy Williamson, female    DOB: 06/20/1951, 65 y.o.   MRN: [id number]  HPI The patient is a 65 YO female coming in for 4 days of cough with wheezing. Some SOB. She started with nasal congestion and drainage. No fevers or chills. Her husband was sick last week before she started having symptoms. Symptoms are getting worse overall.   Review of Systems  Constitutional: Positive for activity change. Negative for appetite change, chills, fatigue, fever and unexpected weight change.  HENT: Positive for congestion, ear pain, postnasal drip and rhinorrhea. Negative for ear discharge, sinus pain, sinus pressure, sneezing, sore throat, tinnitus and trouble swallowing.   Eyes: Negative.   Respiratory: Positive for cough and wheezing. Negative for apnea, chest tightness and shortness of breath.   Cardiovascular: Negative.   Gastrointestinal: Negative.   Musculoskeletal: Negative.       Objective:   Physical Exam  Constitutional: She is oriented to person, place, and time. She appears well-developed and well-nourished.  HENT:  Head: Normocephalic and atraumatic.  TMs normal bilateral, oropharynx with redness and clear drainage.   Eyes: EOM are normal.  Neck: Normal range of motion.  Cardiovascular: Normal rate and regular rhythm.   Pulmonary/Chest: Effort normal. No respiratory distress. She has wheezes. She has no rales. She exhibits no tenderness.  Diffuse expiratory wheezing   Abdominal: Soft. She exhibits no distension. There is no tenderness. There is no rebound.  Lymphadenopathy:    She has no cervical adenopathy.  Neurological: She is alert and oriented to person, place, and time. Coordination normal.  Skin: Skin is warm and dry.   Vitals:   06/11/16 1015 06/11/16 1036  BP: (!) 180/98 (!) 178/90  Pulse: 76   Resp: 18   Temp: 98.3 F (36.8 C)   TempSrc: Oral   SpO2: 98%   Weight: 187 lb (84.8 kg)   Height: 5\' 1"  (1.549 m)       Assessment & Plan:

## 2016-11-08 ENCOUNTER — Other Ambulatory Visit: Payer: Self-pay | Admitting: Obstetrics & Gynecology

## 2016-11-08 DIAGNOSIS — Z1231 Encounter for screening mammogram for malignant neoplasm of breast: Secondary | ICD-10-CM

## 2016-11-15 NOTE — Progress Notes (Addendum)
66 y.o. G2P2 MarriedCaucasianF here for annual exam.  Doing well.  Frustrated with weight but this is a lifetime issue for her.  States she is going to go back to [employer].  Very happy with being married again.  Just had first year anniversary in [place].  Went to [employer] for anniversary.  Went to [employer] in the fall.  Denies vaginal bleeding.  Patient's last menstrual period was 07/23/2004.          Sexually active: No.  The current method of family planning is post menopausal status.    Exercising: Yes.    occasional walking, yard work Smoker:  no  Health Maintenance: Pap:  08/04/15 ASCUS. HR HPV:neg   05/01/13 Neg  History of abnormal Pap:  no MMG:  12/08/15 BIRADS1:neg. Has appt 12/11/16  Colonoscopy:  03/12/12 Normal. f/u 10 years  BMD:   12/08/15 Normal  TDaP:  07/2015  Pneumonia vaccine(s):  03/2015  Zostavax:   05/2013  Hep C testing: 08/04/15 neg  Screening Labs: PCP takes care of labs   reports that she has never smoked. She has never used smokeless tobacco. She reports that she does not drink alcohol or use drugs.  Past Medical History:  Diagnosis Date  . Arthritis    BACK AND SHOULDERS  . Bronchitis 07-14-11  . Cataract immature BILATERAL  . Depressive disorder, not elsewhere classified   . Dysphagia   . History of esophageal reflux   . History of migraine headaches   . History of pneumonia 05-14-11  . Obesity, unspecified   . Palpitations   . Pure hypercholesterolemia   . Unspecified essential hypertension   . Unspecified venous (peripheral) insufficiency     Past Surgical History:  Procedure Laterality Date  . CARPAL TUNNEL RELEASE Right 2004  . CARPAL TUNNEL RELEASE Left 2006  . CESAREAN SECTION  9528,4132  . EAR CYST EXCISION  08/03/2011   patient states this is incorrect  . KNEE ARTHROSCOPY W/ MENISCAL REPAIR  08/03/2011   with popliteal cyst excition  . PULLEY RELEASE RIGHT TRIGGER FINGER Right 01/08/2011  . TRIGGER FINGER RELEASE Right 04/20/13  .  TRIGGER FINGER RELEASE Left 05/06/14  . TUBAL LIGATION  1977   with second cesarean section    Current Outpatient Prescriptions  Medication Sig Dispense Refill  . Ibuprofen 200 MG CAPS Take by mouth as needed.    . nystatin-triamcinolone (MYCOLOG II) cream Apply 1 application topically 2 (two) times daily. Apply to affected area BID for up to 7 days. 60 g 0  . omeprazole (PRILOSEC) 20 MG capsule Take 20 mg by mouth daily.     No current facility-administered medications for this visit.     Family History  Problem Relation Age of Onset  . Heart failure Mother   . Osteoporosis Mother   . Hyperlipidemia Mother   . Hypertension Mother   . Heart disease Father   . Clotting disorder Father   . Liver cancer Brother   . Liver disease Maternal Aunt     x2  . Colon cancer Neg Hx     ROS:  Pertinent items are noted in HPI.  Otherwise, a comprehensive ROS was negative.  Exam:   BP (!) 148/70 (BP Location: Right Arm, Patient Position: Sitting, Cuff Size: Normal)   Pulse 68   Resp 16   Ht 5' (1.524 m)   Wt 199 lb (90.3 kg)   LMP 07/23/2004   BMI 38.86 kg/m   Weight change: +15#  Height: 5' (152.4 cm)  Ht Readings from Last 3 Encounters:  11/16/16 5' (1.524 m)  06/11/16 5\' 1"  (1.549 m)  04/02/16 5\' 1"  (1.549 m)    General appearance: alert, cooperative and appears stated age Head: Normocephalic, without obvious abnormality, atraumatic Neck: no adenopathy, supple, symmetrical, trachea midline and thyroid normal to inspection and palpation Lungs: clear to auscultation bilaterally Breasts: normal appearance, no masses or tenderness Heart: regular rate and rhythm Abdomen: soft, non-tender; bowel sounds normal; no masses,  no organomegaly Extremities: extremities normal, atraumatic, no cyanosis or edema Skin: Skin color, texture, turgor normal. No rashes or lesions Lymph nodes: Cervical, supraclavicular, and axillary nodes normal. No abnormal inguinal nodes palpated Neurologic:  Grossly normal   Pelvic: External genitalia:  no lesions              Urethra:  normal appearing urethra with no masses, tenderness or lesions              Bartholins and Skenes: normal                 Vagina: normal appearing vagina with normal color and discharge, no lesions              Cervix: no lesions              Pap taken: No. Bimanual Exam:  Uterus:  normal size, contour, position, consistency, mobility, non-tender              Adnexa: normal adnexa and no mass, fullness, tenderness               Rectovaginal: Confirms               Anus:  normal sphincter tone, no lesions  Chaperone was present for exam.  A:  Well Woman with normal exam PMP, no HRT Hypertension.  Came of blood pressure mediation with weight loss.  Pt is checking BP at home. Weight gain Mildly elevated glucose on lab work 9/17  P:   Mammogram guidelines reviewed.   pap smear 2017--ASCUS with neg HR HPV.  No pap smear obtained today. HbA1C obtained today Will completed second pneumonia vaccine this fall.  Reviewed new shingles vaccine with pt.  Will wait to get this. Pt is checking BPs at home and is going to work on weight loss.  Knows needs follow up with Dr. Sharlet Salina if this remains elevated. return annually or prn

## 2016-11-16 ENCOUNTER — Encounter: Payer: Self-pay | Admitting: Obstetrics & Gynecology

## 2016-11-16 ENCOUNTER — Ambulatory Visit (INDEPENDENT_AMBULATORY_CARE_PROVIDER_SITE_OTHER): Payer: Medicare Other | Admitting: Obstetrics & Gynecology

## 2016-11-16 VITALS — BP 148/70 | HR 68 | Resp 16 | Ht 60.0 in | Wt 199.0 lb

## 2016-11-16 DIAGNOSIS — R7309 Other abnormal glucose: Secondary | ICD-10-CM

## 2016-11-16 DIAGNOSIS — Z01419 Encounter for gynecological examination (general) (routine) without abnormal findings: Secondary | ICD-10-CM

## 2016-11-17 LAB — HEMOGLOBIN A1C
Hgb A1c MFr Bld: 5.3 % (ref <5.7)
MEAN PLASMA GLUCOSE: 105 mg/dL

## 2016-11-22 ENCOUNTER — Encounter: Payer: Self-pay | Admitting: Internal Medicine

## 2016-11-22 ENCOUNTER — Ambulatory Visit (INDEPENDENT_AMBULATORY_CARE_PROVIDER_SITE_OTHER): Payer: Medicare Other | Admitting: Internal Medicine

## 2016-11-22 VITALS — BP 158/98 | HR 70 | Temp 98.2°F | Resp 12 | Ht 60.0 in | Wt 198.0 lb

## 2016-11-22 DIAGNOSIS — I10 Essential (primary) hypertension: Secondary | ICD-10-CM | POA: Diagnosis not present

## 2016-11-22 MED ORDER — ATENOLOL 50 MG PO TABS: 50.0000 mg | ORAL_TABLET | Freq: Every day | ORAL | 3 refills | Status: DC

## 2016-11-22 NOTE — Assessment & Plan Note (Signed)
BP is elevated several times recently and will resume atenolol 50 mg daily. EKG done today and no significant changes from 2015. She will return quickly for recheck once on meds and call back with any side effects.

## 2016-11-22 NOTE — Patient Instructions (Addendum)
We have sent in the atenolol 50 mg daily to take. You can take it morning or evening.   If you notice a lot more tiredness this could be a side effect of the medicine and we can easily change this so call or message [place] on mychart and let us know.   The EKG is perfectly normal and the same as before. We would like to see you back in about 2-3 months to check on the blood pressure.   DASH Eating Plan DASH stands for "Dietary Approaches to Stop Hypertension." The DASH eating plan is a healthy eating plan that has been shown to reduce high blood pressure (hypertension). It may also reduce your risk for type 2 diabetes, heart disease, and stroke. The DASH eating plan may also help with weight loss. What are tips for following this plan? General guidelines   Avoid eating more than 2,300 mg (milligrams) of salt (sodium) a day. If you have hypertension, you may need to reduce your sodium intake to 1,500 mg a day.  Limit alcohol intake to no more than 1 drink a day for nonpregnant women and 2 drinks a day for men. One drink equals 12 oz of beer, 5 oz of wine, or 1 oz of hard liquor.  Work with your health care provider to maintain a healthy body weight or to lose weight. Ask what an ideal weight is for you.  Get at least 30 minutes of exercise that causes your heart to beat faster (aerobic exercise) most days of the week. Activities may include walking, swimming, or biking.  Work with your health care provider or diet and nutrition specialist (dietitian) to adjust your eating plan to your individual calorie needs. Reading food labels   Check food labels for the amount of sodium per serving. Choose foods with less than 5 percent of the Daily Value of sodium. Generally, foods with less than 300 mg of sodium per serving fit into this eating plan.  To find whole grains, look for the word "whole" as the first word in the ingredient list. Shopping   Buy products labeled as "low-sodium" or "no salt  added."  Buy fresh foods. Avoid canned foods and premade or frozen meals. Cooking   Avoid adding salt when cooking. Use salt-free seasonings or herbs instead of table salt or sea salt. Check with your health care provider or pharmacist before using salt substitutes.  Do not fry foods. Cook foods using healthy methods such as baking, boiling, grilling, and broiling instead.  Cook with heart-healthy oils, such as olive, canola, soybean, or sunflower oil. Meal planning    Eat a balanced diet that includes:  5 or more servings of fruits and vegetables each day. At each meal, try to fill half of your plate with fruits and vegetables.  Up to 6-8 servings of whole grains each day.  Less than 6 oz of lean meat, poultry, or fish each day. A 3-oz serving of meat is about the same size as a deck of cards. One egg equals 1 oz.  2 servings of low-fat dairy each day.  A serving of nuts, seeds, or beans 5 times each week.  Heart-healthy fats. Healthy fats called Omega-3 fatty acids are found in foods such as flaxseeds and coldwater fish, like sardines, salmon, and mackerel.  Limit how much you eat of the following:  Canned or prepackaged foods.  Food that is high in trans fat, such as fried foods.  Food that is high in saturated  fat, such as fatty meat.  Sweets, desserts, sugary drinks, and other foods with added sugar.  Full-fat dairy products.  Do not salt foods before eating.  Try to eat at least 2 vegetarian meals each week.  Eat more home-cooked food and less restaurant, buffet, and fast food.  When eating at a restaurant, ask that your food be prepared with less salt or no salt, if possible. What foods are recommended? The items listed may not be a complete list. Talk with your dietitian about what dietary choices are best for you. Grains  Whole-grain or whole-wheat bread. Whole-grain or whole-wheat pasta. Brown rice. [place]row. Bulgur. Whole-grain and low-sodium  cereals. Pita bread. Low-fat, low-sodium crackers. Whole-wheat flour tortillas. Vegetables  Fresh or frozen vegetables (raw, steamed, roasted, or grilled). Low-sodium or reduced-sodium tomato and vegetable juice. Low-sodium or reduced-sodium tomato sauce and tomato paste. Low-sodium or reduced-sodium canned vegetables. Fruits  All fresh, dried, or frozen fruit. Canned fruit in natural juice (without added sugar). Meat and other protein foods  Skinless chicken or [place]. Ground chicken or [place]. Pork with fat trimmed off. Fish and seafood. Egg whites. Dried beans, peas, or lentils. Unsalted nuts, nut butters, and seeds. Unsalted canned beans. Lean cuts of beef with fat trimmed off. Low-sodium, lean deli meat. Dairy  Low-fat (1%) or fat-free (skim) milk. Fat-free, low-fat, or reduced-fat cheeses. Nonfat, low-sodium ricotta or cottage cheese. Low-fat or nonfat yogurt. Low-fat, low-sodium cheese. Fats and oils  Soft margarine without trans fats. Vegetable oil. Low-fat, reduced-fat, or light mayonnaise and salad dressings (reduced-sodium). Canola, safflower, olive, soybean, and sunflower oils. Avocado. Seasoning and other foods  Herbs. Spices. Seasoning mixes without salt. Unsalted popcorn and pretzels. Fat-free sweets. What foods are not recommended? The items listed may not be a complete list. Talk with your dietitian about what dietary choices are best for you. Grains  Baked goods made with fat, such as croissants, muffins, or some breads. Dry pasta or rice meal packs. Vegetables  Creamed or fried vegetables. Vegetables in a cheese sauce. Regular canned vegetables (not low-sodium or reduced-sodium). Regular canned tomato sauce and paste (not low-sodium or reduced-sodium). Regular tomato and vegetable juice (not low-sodium or reduced-sodium). Angie Fava. Olives. Fruits  Canned fruit in a light or heavy syrup. Fried fruit. Fruit in cream or butter sauce. Meat and other protein foods  Fatty cuts of  meat. Ribs. Fried meat. Berniece Salines. Sausage. Bologna and other processed lunch meats. Salami. Fatback. Hotdogs. Bratwurst. Salted nuts and seeds. Canned beans with added salt. Canned or smoked fish. Whole eggs or egg yolks. Chicken or [place] with skin. Dairy  Whole or 2% milk, cream, and half-and-half. Whole or full-fat cream cheese. Whole-fat or sweetened yogurt. Full-fat cheese. Nondairy creamers. Whipped toppings. Processed cheese and cheese spreads. Fats and oils  Butter. Stick margarine. Lard. Shortening. Ghee. Bacon fat. Tropical oils, such as coconut, palm kernel, or palm oil. Seasoning and other foods  Salted popcorn and pretzels. Onion salt, garlic salt, seasoned salt, table salt, and sea salt. Worcestershire sauce. Tartar sauce. Barbecue sauce. Teriyaki sauce. Soy sauce, including reduced-sodium. Steak sauce. Canned and packaged gravies. Fish sauce. Oyster sauce. Cocktail sauce. Horseradish that you find on the shelf. Ketchup. Mustard. Meat flavorings and tenderizers. Bouillon cubes. Hot sauce and Tabasco sauce. Premade or packaged marinades. Premade or packaged taco seasonings. Relishes. Regular salad dressings. Where to find more information:  National Heart, Lung, and [place]: https://wilson-eaton.com/  American Heart Association: www.heart.org Summary  The DASH eating plan is a healthy eating plan  that has been shown to reduce high blood pressure (hypertension). It may also reduce your risk for type 2 diabetes, heart disease, and stroke.  With the DASH eating plan, you should limit salt (sodium) intake to 2,300 mg a day. If you have hypertension, you may need to reduce your sodium intake to 1,500 mg a day.  When on the DASH eating plan, aim to eat more fresh fruits and vegetables, whole grains, lean proteins, low-fat dairy, and heart-healthy fats.  Work with your health care provider or diet and nutrition specialist (dietitian) to adjust your eating plan to your individual calorie  needs. This information is not intended to replace advice given to you by your health care provider. Make sure you discuss any questions you have with your health care provider. Document Released: 06/28/2011 Document Revised: 07/02/2016 Document Reviewed: 07/02/2016 Elsevier Interactive Patient Education  2017 [employer].

## 2016-11-22 NOTE — Progress Notes (Signed)
Pre visit review using our clinic review tool, if applicable. No additional management support is needed unless otherwise documented below in the visit note. 

## 2016-11-22 NOTE — Progress Notes (Signed)
   Subjective:    Patient ID: Amy Williamson, female    DOB: 02/23/1951, 66 y.o.   MRN: [id number]  HPI The patient is a 66 YO female coming in for high BP at home. She has been feeling off and more tired lately. Also some skipped beats which she is noticing. She has been on blood pressure medicine in the past (review of records indicate losartan and atenolol) but after losing a lot of weight she stopped these. Her weight has been gradually increasing over the last several years. She denies headaches or chest pains. No nausea or vomiting. She is fatigued and some muscle soreness. Has been checking the readings at home and they have been high some lately with top number in the 150s most of the time. Usually the bottom number is 80-90.   PMH, [facility], social history reviewed and updated.   Review of Systems  Constitutional: Positive for fatigue. Negative for activity change, appetite change, chills, fever and unexpected weight change.  Respiratory: Negative.   Cardiovascular: Negative.   Gastrointestinal: Negative for abdominal distention, abdominal pain, constipation, diarrhea and nausea.  Musculoskeletal: Positive for myalgias. Negative for arthralgias, back pain and gait problem.  Skin: Negative.   Neurological: Negative.       Objective:   Physical Exam  Constitutional: She is oriented to person, place, and time. She appears well-developed and well-nourished.  HENT:  Head: Normocephalic and atraumatic.  Eyes: EOM are normal.  Neck: Normal range of motion.  Cardiovascular: Normal rate and regular rhythm.   No murmur heard. Pulmonary/Chest: Effort normal. No respiratory distress. She has no wheezes. She has no rales.  Abdominal: Soft. She exhibits no distension. There is no tenderness. There is no rebound.  Musculoskeletal: She exhibits no edema.  Neurological: She is alert and oriented to person, place, and time.  Skin: Skin is warm and dry.   Vitals:   11/22/16 0937 11/22/16 0957    BP: (!) 162/100 (!) 158/98  Pulse: 70   Resp: 12   Temp: 98.2 F (36.8 C)   TempSrc: Oral   SpO2: 100%   Weight: 198 lb (89.8 kg)   Height: 5' (1.524 m)    EKG: Rate 64, axis normal, intervals normal, sinus, no st or t wave changes, no significant change when compared to prior 2015.    Assessment & Plan:

## 2016-11-27 ENCOUNTER — Telehealth: Payer: Self-pay | Admitting: Internal Medicine

## 2016-11-27 MED ORDER — AMLODIPINE BESYLATE 10 MG PO TABS: 10.0000 mg | ORAL_TABLET | Freq: Every day | ORAL | 3 refills | Status: DC

## 2016-11-27 NOTE — Telephone Encounter (Signed)
Patient states Dr. Sharlet Salina put her on a new BP med.  States she can not sleep at night and has noticed that her top number continues to climb while her bottom number decreases.  States she took her medication this morning and her BP was 166/69 with a heart rate of 51.  States top number will stay in 150's and above.  Patient requesting call back in regard.

## 2016-11-27 NOTE — Telephone Encounter (Signed)
Please advise 

## 2016-11-27 NOTE — Telephone Encounter (Signed)
Have switched to amlodipine which is a different medicine. She can stop taking atenolol and take amlodipine the next day.

## 2016-11-27 NOTE — Telephone Encounter (Signed)
This is an improvement in BP from before. Is she taking med morning or night? Should be taking in the morning.

## 2016-11-27 NOTE — Telephone Encounter (Signed)
Patient called back to follow up on this. She states she is taking the medication in the morning. She also states she just feels so tired and weak.. Patient is requesting a call back. She states she thinks the medication needs to be changed. Please follow up with patient. Thank you.

## 2016-11-28 NOTE — Telephone Encounter (Signed)
Patient contacted and stated awareness 

## 2016-12-11 ENCOUNTER — Ambulatory Visit
Admission: RE | Admit: 2016-12-11 | Discharge: 2016-12-11 | Disposition: A | Discharge status: Home / Self Care | Payer: Medicare Other | Source: Ambulatory Visit | Attending: Obstetrics & Gynecology | Admitting: Obstetrics & Gynecology

## 2016-12-11 DIAGNOSIS — Z1231 Encounter for screening mammogram for malignant neoplasm of breast: Secondary | ICD-10-CM

## 2017-01-02 ENCOUNTER — Telehealth: Payer: Self-pay | Admitting: Internal Medicine

## 2017-01-02 NOTE — Telephone Encounter (Signed)
Pt states she was out on amlodipine, she is having a side affects, her feet and ankles are swelling, she would like a call back she is wondering if this needs to be changed

## 2017-01-03 MED ORDER — AMLODIPINE BESYLATE 10 MG PO TABS
5.0000 mg | ORAL_TABLET | Freq: Every day | ORAL | 3 refills | Status: DC
Start: 2017-01-03 — End: 2017-02-18

## 2017-01-03 NOTE — Telephone Encounter (Signed)
Please advise on if there is an alternative medication

## 2017-01-03 NOTE — Telephone Encounter (Signed)
And will call if she has any questions or complications

## 2017-01-03 NOTE — Telephone Encounter (Signed)
Patient contacted and stated awareness and will call

## 2017-01-03 NOTE — Addendum Note (Signed)
Addended by: Cassandria Anger on: 01/03/2017 11:21 AM   Modules accepted: Orders

## 2017-01-03 NOTE — Telephone Encounter (Signed)
Pls reduce Amlodipine to 5 mg a day. This should help in a few days Thx

## 2017-01-22 ENCOUNTER — Ambulatory Visit: Payer: Medicare Other | Admitting: Internal Medicine

## 2017-02-18 ENCOUNTER — Telehealth: Payer: Self-pay | Admitting: Internal Medicine

## 2017-02-18 MED ORDER — AMLODIPINE BESYLATE 5 MG PO TABS: 5.0000 mg | ORAL_TABLET | Freq: Every day | ORAL | 0 refills | Status: DC

## 2017-02-18 NOTE — Telephone Encounter (Signed)
States that plot changed amlodipine script from 10mg  to 5mg .  Patient has been cutting 10mg  pills in half.  Patient did not receive new script and states pharmacy did not either.  Requesting new script to be sent to [facility] on [name].  B/C 10mg  is hard to cut in half.

## 2017-02-18 NOTE — Telephone Encounter (Signed)
Reviewed chart back on 01/02/17 (see below) Dr. Alain Marion advise pt to take 5 mg of the aml;odipine. Per chart MD printed script don't think rx was faxed. Updated amlodipine script to 5 mg rx sent to [facility]. Notified pt new rx has been sent.../lmb   Plotnikov, Evie Lacks, MD 11:21 AM  Note    Pls reduce Amlodipine to 5 mg a day. This should help in a few days Thx

## 2017-04-03 ENCOUNTER — Encounter: Payer: Medicare Other | Admitting: Internal Medicine

## 2017-04-17 ENCOUNTER — Encounter: Payer: Medicare Other | Admitting: Internal Medicine

## 2017-05-01 ENCOUNTER — Encounter: Payer: Medicare Other | Admitting: Internal Medicine

## 2017-05-07 ENCOUNTER — Other Ambulatory Visit (INDEPENDENT_AMBULATORY_CARE_PROVIDER_SITE_OTHER): Payer: Medicare Other

## 2017-05-07 ENCOUNTER — Encounter: Payer: Self-pay | Admitting: Internal Medicine

## 2017-05-07 ENCOUNTER — Ambulatory Visit (INDEPENDENT_AMBULATORY_CARE_PROVIDER_SITE_OTHER): Payer: Medicare Other | Admitting: Internal Medicine

## 2017-05-07 VITALS — BP 146/80 | HR 70 | Temp 98.4°F | Ht 60.0 in | Wt 204.0 lb

## 2017-05-07 DIAGNOSIS — I10 Essential (primary) hypertension: Secondary | ICD-10-CM

## 2017-05-07 DIAGNOSIS — Z6839 Body mass index (BMI) 39.0-39.9, adult: Secondary | ICD-10-CM | POA: Diagnosis not present

## 2017-05-07 DIAGNOSIS — E785 Hyperlipidemia, unspecified: Secondary | ICD-10-CM | POA: Diagnosis not present

## 2017-05-07 DIAGNOSIS — Z0000 Encounter for general adult medical examination without abnormal findings: Secondary | ICD-10-CM

## 2017-05-07 DIAGNOSIS — K219 Gastro-esophageal reflux disease without esophagitis: Secondary | ICD-10-CM | POA: Diagnosis not present

## 2017-05-07 DIAGNOSIS — Z23 Encounter for immunization: Secondary | ICD-10-CM | POA: Diagnosis not present

## 2017-05-07 DIAGNOSIS — E6609 Other obesity due to excess calories: Secondary | ICD-10-CM

## 2017-05-07 LAB — COMPREHENSIVE METABOLIC PANEL
ALT: 14 U/L (ref 0–35)
AST: 14 U/L (ref 0–37)
Albumin: 4.4 g/dL (ref 3.5–5.2)
Alkaline Phosphatase: 83 U/L (ref 39–117)
BUN: 24 mg/dL — AB (ref 6–23)
CHLORIDE: 103 meq/L (ref 96–112)
CO2: 29 meq/L (ref 19–32)
Calcium: 9.8 mg/dL (ref 8.4–10.5)
Creatinine, Ser: 0.77 mg/dL (ref 0.40–1.20)
GFR: 79.35 mL/min (ref >60.00)
Glucose, Bld: 101 mg/dL — ABNORMAL HIGH (ref 70–99)
POTASSIUM: 4 meq/L (ref 3.5–5.1)
SODIUM: 142 meq/L (ref 135–145)
TOTAL PROTEIN: 7 g/dL (ref 6.0–8.3)
Total Bilirubin: 0.5 mg/dL (ref 0.2–1.2)

## 2017-05-07 LAB — LIPID PANEL
Cholesterol: 228 mg/dL — ABNORMAL HIGH (ref 0–200)
HDL: 62.6 mg/dL (ref >39.00)
LDL CALC: 147 mg/dL — AB (ref 0–99)
NONHDL: 165.85
Total CHOL/HDL Ratio: 4
Triglycerides: 96 mg/dL (ref 0.0–149.0)
VLDL: 19.2 mg/dL (ref 0.0–40.0)

## 2017-05-07 LAB — CBC
HEMATOCRIT: 40 % (ref 36.0–46.0)
HEMOGLOBIN: 13.2 g/dL (ref 12.0–15.0)
MCHC: 33 g/dL (ref 30.0–36.0)
MCV: 84.7 fl (ref 78.0–100.0)
Platelets: 207 10*3/uL (ref 150.0–400.0)
RBC: 4.72 Mil/uL (ref 3.87–5.11)
RDW: 13.9 % (ref 11.5–15.5)
WBC: 8.2 10*3/uL (ref 4.0–10.5)

## 2017-05-07 MED ORDER — ZOSTER VAC RECOMB ADJUVANTED 50 MCG/0.5ML IM SUSR: 0.5000 mL | Freq: Once | INTRAMUSCULAR | 1 refills | Status: AC

## 2017-05-07 NOTE — Progress Notes (Signed)
   Subjective:    Patient ID: Amy Williamson, female    DOB: 1951-07-22, 65 y.o.   MRN: [id number]  HPI Here for medicare wellness, and physical no new complaints. Please see A/P for status and treatment of chronic medical problems.   Diet: heart healthy Physical activity: sedentary Depression/mood screen: negative Hearing: intact to whispered voice Visual acuity: grossly normal, performs annual eye exam  ADLs: capable Fall risk: none Home safety: good Cognitive evaluation: intact to orientation, naming, recall and repetition EOL planning: adv directives discussed  I have personally reviewed and have noted 1. The patient's medical and social history - reviewed today no changes 2. Their use of alcohol, tobacco or illicit drugs 3. Their current medications and supplements 4. The patient's functional ability including ADL's, fall risks, home safety risks and hearing or visual impairment. 5. Diet and physical activities 6. Evidence for depression or mood disorders 7. Care team reviewed and updated (available in snapshot)  Review of Systems  Constitutional: Negative.   HENT: Negative.   Eyes: Negative.   Respiratory: Negative for cough, chest tightness and shortness of breath.   Cardiovascular: Negative for chest pain, palpitations and leg swelling.  Gastrointestinal: Negative for abdominal distention, abdominal pain, constipation, diarrhea, nausea and vomiting.  Musculoskeletal: Negative.   Skin: Negative.   Neurological: Negative.   Psychiatric/Behavioral: Negative.       Objective:   Physical Exam  Constitutional: She is oriented to person, place, and time. She appears well-developed and well-nourished.  Overweight  HENT:  Head: Normocephalic and atraumatic.  Eyes: EOM are normal.  Neck: Normal range of motion.  Cardiovascular: Normal rate and regular rhythm.   Pulmonary/Chest: Effort normal and breath sounds normal. No respiratory distress. She has no wheezes. She has  no rales.  Abdominal: Soft. Bowel sounds are normal. She exhibits no distension. There is no tenderness. There is no rebound.  Musculoskeletal: She exhibits no edema.  Neurological: She is alert and oriented to person, place, and time. Coordination normal.  Skin: Skin is warm and dry.  Psychiatric: She has a normal mood and affect.   Vitals:   05/07/17 0805  BP: (!) 146/80  Pulse: 70  Temp: 98.4 F (36.9 C)  TempSrc: Oral  SpO2: 98%  Weight: 204 lb (92.5 kg)  Height: 5' (1.524 m)      Assessment & Plan:  Flu shot and pneumonia 23 given at visit

## 2017-05-07 NOTE — Assessment & Plan Note (Signed)
BP at goal on amlodipine 5 mg daily. Checking CMP and adjust as needed.  

## 2017-05-07 NOTE — Patient Instructions (Signed)
We have given you some exercises for the foot.    Plantar Fasciitis Rehab Ask your health care provider which exercises are safe for you. Do exercises exactly as told by your health care provider and adjust them as directed. It is normal to feel mild stretching, pulling, tightness, or discomfort as you do these exercises, but you should stop right away if you feel sudden pain or your pain gets worse. Do not begin these exercises until told by your health care provider. Stretching and range of motion exercises These exercises warm up your muscles and joints and improve the movement and flexibility of your foot. These exercises also help to relieve pain. Exercise A: Plantar fascia stretch  1. Sit with your left / right leg crossed over your opposite knee. 2. Hold your heel with one hand with that thumb near your arch. With your other hand, hold your toes and gently pull them back toward the top of your foot. You should feel a stretch on the bottom of your toes or your foot or both. 3. Hold this stretch for__________ seconds. 4. Slowly release your toes and return to the starting position. Repeat __________ times. Complete this exercise __________ times a day. Exercise B: Gastroc, standing  1. Stand with your hands against a wall. 2. Extend your left / right leg behind you, and bend your front knee slightly. 3. Keeping your heels on the floor and keeping your back knee straight, shift your weight toward the wall without arching your back. You should feel a gentle stretch in your left / right calf. 4. Hold this position for __________ seconds. Repeat __________ times. Complete this exercise __________ times a day. Exercise C: Soleus, standing 1. Stand with your hands against a wall. 2. Extend your left / right leg behind you, and bend your front knee slightly. 3. Keeping your heels on the floor, bend your back knee and slightly shift your weight over the back leg. You should feel a gentle stretch  deep in your calf. 4. Hold this position for __________ seconds. Repeat __________ times. Complete this exercise __________ times a day. Exercise D: Gastrocsoleus, standing 1. Stand with the ball of your left / right foot on a step. The ball of your foot is on the walking surface, right under your toes. 2. Keep your other foot firmly on the same step. 3. Hold onto the wall or a railing for balance. 4. Slowly lift your other foot, allowing your body weight to press your heel down over the edge of the step. You should feel a stretch in your left / right calf. 5. Hold this position for __________ seconds. 6. Return both feet to the step. 7. Repeat this exercise with a slight bend in your left / right knee. Repeat __________ times with your left / right knee straight and __________ times with your left / right knee bent. Complete this exercise __________ times a day. Balance exercise This exercise builds your balance and strength control of your arch to help take pressure off your plantar fascia. Exercise E: Single leg stand 1. Without shoes, stand near a railing or in a doorway. You may hold onto the railing or door frame as needed. 2. Stand on your left / right foot. Keep your big toe down on the floor and try to keep your arch lifted. Do not let your foot roll inward. 3. Hold this position for __________ seconds. 4. If this exercise is too easy, you can try it with your  eyes closed or while standing on a pillow. Repeat __________ times. Complete this exercise __________ times a day. This information is not intended to replace advice given to you by your health care provider. Make sure you discuss any questions you have with your health care provider. Document Released: 07/09/2005 Document Revised: 03/13/2016 Document Reviewed: 05/23/2015 Elsevier Interactive Patient Education  2018 [place] Maintenance, Female Adopting a healthy lifestyle and getting preventive care can go a  long way to promote health and wellness. Talk with your health care provider about what schedule of regular examinations is right for you. This is a good chance for you to check in with your provider about disease prevention and staying healthy. In between checkups, there are plenty of things you can do on your own. Experts have done a lot of research about which lifestyle changes and preventive measures are most likely to keep you healthy. Ask your health care provider for more information. Weight and diet Eat a healthy diet  Be sure to include plenty of vegetables, fruits, low-fat dairy products, and lean protein.  Do not eat a lot of foods high in solid fats, added sugars, or salt.  Get regular exercise. This is one of the most important things you can do for your health. ? Most adults should exercise for at least 150 minutes each week. The exercise should increase your heart rate and make you sweat (moderate-intensity exercise). ? Most adults should also do strengthening exercises at least twice a week. This is in addition to the moderate-intensity exercise.  Maintain a healthy weight  Body mass index (BMI) is a measurement that can be used to identify possible weight problems. It estimates body fat based on height and weight. Your health care provider can help determine your BMI and help you achieve or maintain a healthy weight.  For females 52 years of age and older: ? A BMI below 18.5 is considered underweight. ? A BMI of 18.5 to 24.9 is normal. ? A BMI of 25 to 29.9 is considered overweight. ? A BMI of 30 and above is considered obese.  Watch levels of cholesterol and blood lipids  You should start having your blood tested for lipids and cholesterol at 67 years of age, then have this test every 5 years.  You may need to have your cholesterol levels checked more often if: ? Your lipid or cholesterol levels are high. ? You are older than 67 years of age. ? You are at high risk for  heart disease.  Cancer screening Lung Cancer  Lung cancer screening is recommended for adults 35-73 years old who are at high risk for lung cancer because of a history of smoking.  A yearly low-dose CT scan of the lungs is recommended for people who: ? Currently smoke. ? Have quit within the past 15 years. ? Have at least a 30-pack-year history of smoking. A pack year is smoking an average of one pack of cigarettes a day for 1 year.  Yearly screening should continue until it has been 15 years since you quit.  Yearly screening should stop if you develop a health problem that would prevent you from having lung cancer treatment.  Breast Cancer  Practice breast self-awareness. This means understanding how your breasts normally appear and feel.  It also means doing regular breast self-exams. Let your health care provider know about any changes, no matter how small.  If you are in your 20s or 30s, you should have a  clinical breast exam (CBE) by a health care provider every 1-3 years as part of a regular health exam.  If you are 58 or older, have a CBE every year. Also consider having a breast X-ray (mammogram) every year.  If you have a family history of breast cancer, talk to your health care provider about genetic screening.  If you are at high risk for breast cancer, talk to your health care provider about having an MRI and a mammogram every year.  Breast cancer gene (BRCA) assessment is recommended for women who have family members with BRCA-related cancers. BRCA-related cancers include: ? Breast. ? Ovarian. ? Tubal. ? Peritoneal cancers.  Results of the assessment will determine the need for genetic counseling and BRCA1 and BRCA2 testing.  Cervical Cancer Your health care provider may recommend that you be screened regularly for cancer of the pelvic organs (ovaries, uterus, and vagina). This screening involves a pelvic examination, including checking for microscopic changes to  the surface of your cervix (Pap test). You may be encouraged to have this screening done every 3 years, beginning at age 41.  For women ages 78-65, health care providers may recommend pelvic exams and Pap testing every 3 years, or they may recommend the Pap and pelvic exam, combined with testing for human papilloma virus (HPV), every 5 years. Some types of HPV increase your risk of cervical cancer. Testing for HPV may also be done on women of any age with unclear Pap test results.  Other health care providers may not recommend any screening for nonpregnant women who are considered low risk for pelvic cancer and who do not have symptoms. Ask your health care provider if a screening pelvic exam is right for you.  If you have had past treatment for cervical cancer or a condition that could lead to cancer, you need Pap tests and screening for cancer for at least 20 years after your treatment. If Pap tests have been discontinued, your risk factors (such as having a new sexual partner) need to be reassessed to determine if screening should resume. Some women have medical problems that increase the chance of getting cervical cancer. In these cases, your health care provider may recommend more frequent screening and Pap tests.  Colorectal Cancer  This type of cancer can be detected and often prevented.  Routine colorectal cancer screening usually begins at 67 years of age and continues through 67 years of age.  Your health care provider may recommend screening at an earlier age if you have risk factors for colon cancer.  Your health care provider may also recommend using home test kits to check for hidden blood in the stool.  A small camera at the end of a tube can be used to examine your colon directly (sigmoidoscopy or colonoscopy). This is done to check for the earliest forms of colorectal cancer.  Routine screening usually begins at age 65.  Direct examination of the colon should be repeated every  5-10 years through 67 years of age. However, you may need to be screened more often if early forms of precancerous polyps or small growths are found.  Skin Cancer  Check your skin from head to toe regularly.  Tell your health care provider about any new moles or changes in moles, especially if there is a change in a mole's shape or color.  Also tell your health care provider if you have a mole that is larger than the size of a pencil eraser.  Always use sunscreen.  Apply sunscreen liberally and repeatedly throughout the day.  Protect yourself by wearing long sleeves, pants, a wide-brimmed hat, and sunglasses whenever you are outside.  Heart disease, diabetes, and high blood pressure  High blood pressure causes heart disease and increases the risk of stroke. High blood pressure is more likely to develop in: ? People who have blood pressure in the high end of the normal range (130-139/85-89 mm Hg). ? People who are overweight or obese. ? People who are African American.  If you are 57-76 years of age, have your blood pressure checked every 3-5 years. If you are 50 years of age or older, have your blood pressure checked every year. You should have your blood pressure measured twice-once when you are at a hospital or clinic, and once when you are not at a hospital or clinic. Record the average of the two measurements. To check your blood pressure when you are not at a hospital or clinic, you can use: ? An automated blood pressure machine at a pharmacy. ? A home blood pressure monitor.  If you are between 90 years and 38 years old, ask your health care provider if you should take aspirin to prevent strokes.  Have regular diabetes screenings. This involves taking a blood sample to check your fasting blood sugar level. ? If you are at a normal weight and have a low risk for diabetes, have this test once every three years after 67 years of age. ? If you are overweight and have a high risk for  diabetes, consider being tested at a younger age or more often. Preventing infection Hepatitis B  If you have a higher risk for hepatitis B, you should be screened for this virus. You are considered at high risk for hepatitis B if: ? You were born in a country where hepatitis B is common. Ask your health care provider which countries are considered high risk. ? Your parents were born in a high-risk country, and you have not been immunized against hepatitis B (hepatitis B vaccine). ? You have HIV or AIDS. ? You use needles to inject street drugs. ? You live with someone who has hepatitis B. ? You have had sex with someone who has hepatitis B. ? You get hemodialysis treatment. ? You take certain medicines for conditions, including cancer, organ transplantation, and autoimmune conditions.  Hepatitis C  Blood testing is recommended for: ? Everyone born from [birth year] through 1966. ? Anyone with known risk factors for hepatitis C.  Sexually transmitted infections (STIs)  You should be screened for sexually transmitted infections (STIs) including gonorrhea and chlamydia if: ? You are sexually active and are younger than 67 years of age. ? You are older than 67 years of age and your health care provider tells you that you are at risk for this type of infection. ? Your sexual activity has changed since you were last screened and you are at an increased risk for chlamydia or gonorrhea. Ask your health care provider if you are at risk.  If you do not have HIV, but are at risk, it may be recommended that you take a prescription medicine daily to prevent HIV infection. This is called pre-exposure prophylaxis (PrEP). You are considered at risk if: ? You are sexually active and do not regularly use condoms or know the HIV status of your partner(s). ? You take drugs by injection. ? You are sexually active with a partner who has HIV.  Talk with your health care provider  about whether you are at high risk  of being infected with HIV. If you choose to begin PrEP, you should first be tested for HIV. You should then be tested every 3 months for as long as you are taking PrEP. Pregnancy  If you are premenopausal and you may become pregnant, ask your health care provider about preconception counseling.  If you may become pregnant, take 400 to 800 micrograms (mcg) of folic acid every day.  If you want to prevent pregnancy, talk to your health care provider about birth control (contraception). Osteoporosis and menopause  Osteoporosis is a disease in which the bones lose minerals and strength with aging. This can result in serious bone fractures. Your risk for osteoporosis can be identified using a bone density scan.  If you are 62 years of age or older, or if you are at risk for osteoporosis and fractures, ask your health care provider if you should be screened.  Ask your health care provider whether you should take a calcium or vitamin D supplement to lower your risk for osteoporosis.  Menopause may have certain physical symptoms and risks.  Hormone replacement therapy may reduce some of these symptoms and risks. Talk to your health care provider about whether hormone replacement therapy is right for you. Follow these instructions at home:  Schedule regular health, dental, and eye exams.  Stay current with your immunizations.  Do not use any tobacco products including cigarettes, chewing tobacco, or electronic cigarettes.  If you are pregnant, do not drink alcohol.  If you are breastfeeding, limit how much and how often you drink alcohol.  Limit alcohol intake to no more than 1 drink per day for nonpregnant women. One drink equals 12 ounces of beer, 5 ounces of wine, or 1 ounces of hard liquor.  Do not use street drugs.  Do not share needles.  Ask your health care provider for help if you need support or information about quitting drugs.  Tell your health care provider if you often  feel depressed.  Tell your health care provider if you have ever been abused or do not feel safe at home. This information is not intended to replace advice given to you by your health care provider. Make sure you discuss any questions you have with your health care provider. Document Released: 01/22/2011 Document Revised: 12/15/2015 Document Reviewed: 04/12/2015 Elsevier Interactive Patient Education  [employer].

## 2017-05-07 NOTE — Assessment & Plan Note (Signed)
Checking LDL and goal <130.

## 2017-05-07 NOTE — Assessment & Plan Note (Signed)
Given flu shot and pneumonia 23 shot today. Counseled about shingrix today. Dexa up to date. Colonoscopy up to date and mammogram up to date. Counseled about sun safety and mole surveillance. Given 10 year screening recommendations.

## 2017-05-07 NOTE — Assessment & Plan Note (Signed)
Working on weight loss but with her foot pain she has not been exercising. Counseled to work on exercise.

## 2017-05-07 NOTE — Assessment & Plan Note (Signed)
Taking omeprazole 20 mg daily and controlling symptoms. With history of dysphagia benefit to continue outweighs risk.

## 2017-05-13 ENCOUNTER — Telehealth: Payer: Self-pay | Admitting: Internal Medicine

## 2017-05-13 MED ORDER — OMEPRAZOLE 20 MG PO CPDR: 20.0000 mg | DELAYED_RELEASE_CAPSULE | Freq: Every day | ORAL | 1 refills | Status: DC

## 2017-05-13 MED ORDER — ZOSTER VAC RECOMB ADJUVANTED 50 MCG/0.5ML IM SUSR: 0.5000 mL | Freq: Once | INTRAMUSCULAR | 0 refills | Status: AC

## 2017-05-13 MED ORDER — AMLODIPINE BESYLATE 5 MG PO TABS: 5.0000 mg | ORAL_TABLET | Freq: Every day | ORAL | 1 refills | Status: DC

## 2017-05-13 NOTE — Telephone Encounter (Signed)
Reviewed chart pt is up-to-date sent refills to [facility]. Pls advise on rx for shingrix...Johny Chess

## 2017-05-13 NOTE — Telephone Encounter (Signed)
Pt called for a refill of her amLODipine (NORVASC) 5 MG tablet And her omeprazole (PRILOSEC) 20 MG capsule Please advise  POF Also states she needs a Rx for her Shingrix vaccine sent to the pharmacy

## 2017-05-13 NOTE — Telephone Encounter (Signed)
Okay to send rx for shingles.

## 2017-05-13 NOTE — Telephone Encounter (Signed)
Notified pt refills and injection has been sent to [facility]..lmb

## 2017-05-16 ENCOUNTER — Other Ambulatory Visit: Payer: Self-pay | Admitting: Internal Medicine

## 2017-08-23 DIAGNOSIS — M722 Plantar fascial fibromatosis: Secondary | ICD-10-CM

## 2017-08-23 HISTORY — DX: Plantar fascial fibromatosis: M72.2

## 2017-10-17 ENCOUNTER — Ambulatory Visit (INDEPENDENT_AMBULATORY_CARE_PROVIDER_SITE_OTHER): Payer: Medicare Other | Admitting: Family Medicine

## 2017-10-17 ENCOUNTER — Encounter: Payer: Self-pay | Admitting: Family Medicine

## 2017-10-17 ENCOUNTER — Ambulatory Visit: Payer: Self-pay | Admitting: *Deleted

## 2017-10-17 DIAGNOSIS — J069 Acute upper respiratory infection, unspecified: Secondary | ICD-10-CM | POA: Diagnosis not present

## 2017-10-17 NOTE — Patient Instructions (Signed)
Please try things such as zyrtec-D or allegra-D which is an antihistamine and decongestant.   Please try afrin which will help with nasal congestion but use for only three days.   Please also try using a netti pot on a regular occasion.  Honey can help with a sore throat.   Vick's and Delsym can help with a cough.

## 2017-10-17 NOTE — Assessment & Plan Note (Signed)
Symptoms likely viral in nature.  - counseled on supportive care  - given indications to follow up.

## 2017-10-17 NOTE — Telephone Encounter (Signed)
Pt reports cough, cold symptoms x 1 week. Husband presently with same symptoms. Cough productive for yellowish mucous. LGT last several days ...99.3 to 99.7 max. States intermittent wheezing last 2 days. Denies any SOB,CP, pain on inspiration. Denies sore throat, earache or facial tenderness. Same Day Appt made with Dr. Raeford Razor, however pt is requesting "something be called in instead of coming in." Pt made aware she would probably need to be seen. Care advise given per protocol.   Reason for Disposition . Fever present > 3 days (72 hours)  Answer Assessment - Initial Assessment Questions 1. ONSET: "When did the cough begin?"      1 week ago 2. SEVERITY: "How bad is the cough today?"      Moderate 3. RESPIRATORY DISTRESS: "Describe your breathing."      Wheezing at times past 2 days, mild, intermittent 4. FEVER: "Do you have a fever?" If so, ask: "What is your temperature, how was it measured, and when did it start?"     99.3 - 99.7 5. SPUTUM: "Describe the color of your sputum" (clear, white, yellow, green)     Yellowish, "draining down throat." 6. HEMOPTYSIS: "Are you coughing up any blood?" If so ask: "How much?" (flecks, streaks, tablespoons, etc.)     No 7. CARDIAC HISTORY: "Do you have any history of heart disease?" (e.g., heart attack, congestive heart failure)      No 8. LUNG HISTORY: "Do you have any history of lung disease?"  (e.g., pulmonary embolus, asthma, emphysema)     No 9. PE RISK FACTORS: "Do you have a history of blood clots?" (or: recent major surgery, recent prolonged travel, bedridden )     No 10. OTHER SYMPTOMS: "Do you have any other symptoms?" (e.g., runny nose, wheezing, chest pain)       Wheezing, mild, intermittent; denies SOB  Protocols used: COUGH - ACUTE PRODUCTIVE-A-AH

## 2017-10-17 NOTE — Progress Notes (Signed)
KIMIYO CARMICHEAL - 67 y.o. female MRN [id number]  Date of birth: 1951/05/26  SUBJECTIVE:  Including CC & ROS.  Chief Complaint  Patient presents with  . Cough    MADALEE ALTMANN is a 67 y.o. female that is presenting with a cough. Ongoing for three days. Denies fever or body aches. She has been taking Motrin with some improvement. Admits to productive cough-mucous green in color. Her husband has similar symptoms.  Review of Systems  Constitutional: Negative for fever.  HENT: Positive for congestion.   Respiratory: Positive for cough.   Cardiovascular: Negative for chest pain.    HISTORY: Past Medical, Surgical, Social, and Family History Reviewed & Updated per EMR.   Pertinent Historical Findings include:  Past Medical History:  Diagnosis Date  . Arthritis    BACK AND SHOULDERS  . Bronchitis 07-14-11  . Cataract immature BILATERAL  . Depressive disorder, not elsewhere classified   . Dysphagia   . History of esophageal reflux   . History of migraine headaches   . History of pneumonia 05-14-11  . Obesity, unspecified   . Palpitations   . Pure hypercholesterolemia   . Unspecified essential hypertension   . Unspecified venous (peripheral) insufficiency     Past Surgical History:  Procedure Laterality Date  . CARPAL TUNNEL RELEASE Right 2004  . CARPAL TUNNEL RELEASE Left 2006  . CESAREAN SECTION  [id number]  . EAR CYST EXCISION  08/03/2011   patient states this is incorrect  . KNEE ARTHROSCOPY W/ MENISCAL REPAIR  08/03/2011   with popliteal cyst excition  . PULLEY RELEASE RIGHT TRIGGER FINGER Right 01/08/2011  . TRIGGER FINGER RELEASE Right 04/20/13  . TRIGGER FINGER RELEASE Left 05/06/14  . TUBAL LIGATION  1977   with second cesarean section    Allergies  Allergen Reactions  . Avelox [Moxifloxacin Hcl In Nacl]     Pt did not like side effects  . Azithromycin     ?causes muscle weakness, dysphagia    Family History  Problem Relation Age of Onset  . Heart failure  Mother   . Osteoporosis Mother   . Hyperlipidemia Mother   . Hypertension Mother   . Heart disease Father   . Clotting disorder Father   . Liver cancer Brother   . Liver disease Maternal Aunt        x2  . Colon cancer Neg Hx      Social History   Socioeconomic History  . Marital status: Married    Spouse name: david  . Number of children: 2  . Years of education: Not on file  . Highest education level: Not on file  Occupational History  . Occupation: retired  Scientific laboratory technician  . Financial resource strain: Not on file  . Food insecurity:    Worry: Not on file    Inability: Not on file  . Transportation needs:    Medical: Not on file    Non-medical: Not on file  Tobacco Use  . Smoking status: Never Smoker  . Smokeless tobacco: Never Used  Substance and Sexual Activity  . Alcohol use: No  . Drug use: No  . Sexual activity: Never    Birth control/protection: Surgical, Abstinence    Comment: BTL  Lifestyle  . Physical activity:    Days per week: Not on file    Minutes per session: Not on file  . Stress: Not on file  Relationships  . Social connections:    Talks on phone: Not on file  Gets together: Not on file    Attends religious service: Not on file    Active member of club or organization: Not on file    Attends meetings of clubs or organizations: Not on file    Relationship status: Not on file  . Intimate partner violence:    Fear of current or ex partner: Not on file    Emotionally abused: Not on file    Physically abused: Not on file    Forced sexual activity: Not on file  Other Topics Concern  . Not on file  Social History Narrative  . Not on file     PHYSICAL EXAM:  VS: BP (!) 148/84 (BP Location: Left Arm, Patient Position: Sitting, Cuff Size: Normal)   Pulse 73   Temp 98.6 F (37 C) (Oral)   Ht 5' (1.524 m)   Wt 201 lb (91.2 kg)   LMP 07/23/2004   SpO2 99%   BMI 39.26 kg/m  Physical Exam Gen: NAD, alert, cooperative with exam ENT:  normal lips, normal nasal mucosa, tympanic membranes clear and intact bilaterally, normal oropharynx, no cervical lymphadenopathy Eye: normal EOM, normal conjunctiva and lids CV:  no edema, +2 pedal pulses, regular rate and rhythm, S1-S2   Resp: no accessory muscle use, non-labored, clear to auscultation bilaterally, no crackles or wheezes Skin: no rashes, no areas of induration  Neuro: normal tone, normal sensation to touch Psych:  normal insight, alert and oriented MSK: Normal gait, normal strength       ASSESSMENT & PLAN:   URI (upper respiratory infection) Symptoms likely viral in nature.  - counseled on supportive care  - given indications to follow up.

## 2017-10-30 ENCOUNTER — Other Ambulatory Visit: Payer: Self-pay | Admitting: Internal Medicine

## 2017-11-01 ENCOUNTER — Other Ambulatory Visit: Payer: Self-pay | Admitting: Internal Medicine

## 2017-11-02 ENCOUNTER — Ambulatory Visit (HOSPITAL_COMMUNITY)
Admission: EM | Admit: 2017-11-02 | Discharge: 2017-11-02 | Disposition: A | Discharge status: Home / Self Care | Payer: Medicare Other | Attending: Family Medicine | Admitting: Family Medicine

## 2017-11-02 ENCOUNTER — Ambulatory Visit (INDEPENDENT_AMBULATORY_CARE_PROVIDER_SITE_OTHER): Payer: Medicare Other

## 2017-11-02 ENCOUNTER — Encounter (HOSPITAL_COMMUNITY): Payer: Self-pay | Admitting: *Deleted

## 2017-11-02 ENCOUNTER — Other Ambulatory Visit: Payer: Self-pay

## 2017-11-02 DIAGNOSIS — J209 Acute bronchitis, unspecified: Secondary | ICD-10-CM | POA: Diagnosis not present

## 2017-11-02 MED ORDER — PREDNISONE 50 MG PO TABS: ORAL_TABLET | ORAL | 0 refills | Status: DC

## 2017-11-02 MED ORDER — CYCLOBENZAPRINE HCL 10 MG PO TABS: 5.0000 mg | ORAL_TABLET | Freq: Two times a day (BID) | ORAL | 0 refills | Status: DC | PRN

## 2017-11-02 NOTE — ED Provider Notes (Signed)
[place]    CSN: [id number] Arrival date & time: 11/02/17  1342     History   Chief Complaint Chief Complaint  Patient presents with  . Fever  . Back Pain    HPI ORAL HALLGREN is a 67 y.o. female.   68 yo female here for cough that started 3-4 weeks ago as viral URI. She took OTC meds that helped a little but over the past few days, cough has worsened. She now has a fever at home. She also complains of right sided pain that started at her back and radiates around her side. Nothing makes better or worse.     Past Medical History:  Diagnosis Date  . Arthritis    BACK AND SHOULDERS  . Bronchitis 07-14-11  . Cataract immature BILATERAL  . Depressive disorder, not elsewhere classified   . Dysphagia   . History of esophageal reflux   . History of migraine headaches   . History of pneumonia 05-14-11  . Obesity, unspecified   . Palpitations   . Pure hypercholesterolemia   . Unspecified essential hypertension   . Unspecified venous (peripheral) insufficiency     Patient Active Problem List   Diagnosis Date Noted  . URI (upper respiratory infection) 10/17/2017  . Routine general medical examination at a health care facility 04/02/2016  . Other dysphagia 09/21/2013  . Palpitations 03/24/2013  . Obesity 03/24/2013  . DJD (degenerative joint disease) 03/24/2013  . GERD (gastroesophageal reflux disease) 02/27/2012  . Hyperlipidemia 01/21/2008  . Essential hypertension 01/21/2008    Past Surgical History:  Procedure Laterality Date  . CARPAL TUNNEL RELEASE Right 2004  . CARPAL TUNNEL RELEASE Left 2006  . CESAREAN SECTION  4098,1191  . EAR CYST EXCISION  08/03/2011   patient states this is incorrect  . KNEE ARTHROSCOPY    . KNEE ARTHROSCOPY W/ MENISCAL REPAIR  08/03/2011   with popliteal cyst excition  . KNEE ARTHROSCOPY WITH EXCISION BAKER'S CYST    . PULLEY RELEASE RIGHT TRIGGER FINGER Right 01/08/2011  . TRIGGER FINGER RELEASE Right 04/20/13  .  TRIGGER FINGER RELEASE Left 05/06/14  . TUBAL LIGATION  1977   with second cesarean section    OB History    Gravida  2   Para  2   Term      Preterm      AB      Living  2     SAB      TAB      Ectopic      Multiple      Live Births               Home Medications    Prior to Admission medications   Medication Sig Start Date End Date Taking? Authorizing Provider  amLODipine (NORVASC) 5 MG tablet TAKE 1 TABLET BY MOUTH EVERY DAY 10/30/17  Yes Hoyt Koch, MD  Ibuprofen 200 MG CAPS Take by mouth as needed.   Yes [provider]  omeprazole (PRILOSEC) 20 MG capsule TAKE 1 CAPSULE BY MOUTH EVERY DAY 11/01/17  Yes Hoyt Koch, MD    Family History Family History  Problem Relation Age of Onset  . Heart failure Mother   . Osteoporosis Mother   . Hyperlipidemia Mother   . Hypertension Mother   . Heart disease Father   . Clotting disorder Father   . Liver cancer Brother   . Liver disease Maternal Aunt  x2  . Colon cancer Neg Hx     Social History Social History   Tobacco Use  . Smoking status: Never Smoker  . Smokeless tobacco: Never Used  Substance Use Topics  . Alcohol use: No  . Drug use: No     Allergies   Avelox [moxifloxacin hcl in nacl] and Azithromycin   Review of Systems Review of Systems  Constitutional: Positive for fever. Negative for activity change and appetite change.  HENT: Negative for congestion and ear pain.   Eyes: Negative for discharge and itching.  Respiratory: Positive for cough. Negative for chest tightness.   Cardiovascular: Negative for chest pain and palpitations.  Gastrointestinal: Negative for abdominal distention and abdominal pain.  Endocrine: Negative for cold intolerance and heat intolerance.  Genitourinary: Negative for difficulty urinating and dysuria.  Musculoskeletal: Negative for arthralgias and back pain.       Rib pain  Neurological: Negative for dizziness and  headaches.  Hematological: Negative for adenopathy. Does not bruise/bleed easily.     Physical Exam Triage Vital Signs ED Triage Vitals [11/02/17 1357]  Enc Vitals Group     BP (!) 149/64     Pulse Rate (!) 103     Resp 20     Temp 99.4 F (37.4 C)     Temp Source Oral     SpO2 98 %     Weight      Height      Head Circumference      Peak Flow      Pain Score 5     Pain Loc      Pain Edu?      Excl. in [place]?    No data found.  Updated Vital Signs BP (!) 149/64   Pulse (!) 103   Temp 99.4 F (37.4 C) (Oral)   Resp 20   LMP 07/23/2004   SpO2 98%   Visual Acuity Right Eye Distance:   Left Eye Distance:   Bilateral Distance:    Right Eye Near:   Left Eye Near:    Bilateral Near:     Physical Exam  Constitutional: She is oriented to person, place, and time. She appears well-developed and well-nourished.  HENT:  Head: Normocephalic and atraumatic.  Eyes: Pupils are equal, round, and reactive to light. EOM are normal.  Neck: Normal range of motion. Neck supple.  Cardiovascular: Normal rate and regular rhythm.  Pulmonary/Chest: Effort normal. No respiratory distress.  RLL: decreased breath sounds  Musculoskeletal: Normal range of motion. She exhibits no edema.  Neurological: She is alert and oriented to person, place, and time.  Skin: Skin is warm and dry.  Psychiatric: She has a normal mood and affect. Her behavior is normal.    CONSTITUTIONAL: Well-developed, well-nourished female in no acute distress.  EYES: EOM intact, conjunctivae normal, no scleral icterus HEAD: Normocephalic, atraumatic ENT: External right and left ear normal, oropharynx is clear and moist. CARDIOVASCULAR: No cyanosis or edema. 2+ distal pulses.  RESPIRATORY:Effort and breath sounds normal, no problems with respiration noted. GASTROINTESTINAL:Soft, normal bowel sounds, no distention noted.  No tenderness, rebound or guarding.  MUSCULOSKELETAL: Normal range of motion. No  tenderness. SKIN: Skin is warm and dry. No rash noted. Not diaphoretic. No erythema. No pallor. [place]: Alert and oriented to person, place, and time. Normal reflexes, muscle tone, coordination. No cranial nerve deficit noted. PSYCHIATRIC: Normal mood and affect. Normal behavior. Normal judgment and thought content. HEM/LYMPH/IMMUNOLOGIC: Neck supple, no masses.  Normal thyroid.  UC Treatments / Results  Labs (all labs ordered are listed, but only abnormal results are displayed) Labs Reviewed - No data to display  EKG None Radiology No results found.  Procedures Procedures (including critical care time)  Medications Ordered in UC Medications - No data to display   Initial Impression / Assessment and Plan / UC Course  I have reviewed the triage vital signs and the nursing notes.  Pertinent labs & imaging results that were available during my care of the patient were reviewed by me and considered in my medical decision making (see chart for details).     1. Bronchitis- CXR negative for pneumonia. Will treat with steroids and supportive care.  Final Clinical Impressions(s) / UC Diagnoses   Final diagnoses:  None    ED Discharge Orders    None       Controlled Substance Prescriptions [place] Controlled Substance Registry consulted? Not Applicable   Dannielle Huh, DO 11/02/17 1458

## 2017-11-02 NOTE — ED Triage Notes (Signed)
C/O having URI approx 3-4 wks ago; continues with "hacking cough".  Over past week c/o right mid back pain radiating around to right ribs.  This AM had temp 100.9.

## 2017-11-06 ENCOUNTER — Other Ambulatory Visit: Payer: Self-pay | Admitting: Obstetrics & Gynecology

## 2017-11-06 DIAGNOSIS — Z1231 Encounter for screening mammogram for malignant neoplasm of breast: Secondary | ICD-10-CM

## 2017-11-09 ENCOUNTER — Encounter (HOSPITAL_COMMUNITY): Payer: Self-pay | Admitting: *Deleted

## 2017-11-09 ENCOUNTER — Ambulatory Visit (HOSPITAL_COMMUNITY)
Admission: EM | Admit: 2017-11-09 | Discharge: 2017-11-09 | Disposition: A | Discharge status: Home / Self Care | Payer: Medicare Other | Attending: Family Medicine | Admitting: Family Medicine

## 2017-11-09 DIAGNOSIS — M545 Low back pain, unspecified: Secondary | ICD-10-CM

## 2017-11-09 MED ORDER — TRAMADOL HCL 50 MG PO TABS: 50.0000 mg | ORAL_TABLET | Freq: Four times a day (QID) | ORAL | 0 refills | Status: DC | PRN

## 2017-11-09 NOTE — Discharge Instructions (Signed)

## 2017-11-09 NOTE — ED Provider Notes (Signed)
  [place]    CSN: [id number] Arrival date & time: 11/09/17  1450  Musculoskeletal Exam  Patient: Amy Williamson DOB: 1950/09/13  DOS: 11/09/2017  SUBJECTIVE:  Chief Complaint:   Chief Complaint  Patient presents with  . Back Pain    Amy Williamson is a 67 y.o.  female for evaluation and treatment of his back pain.   Onset:  1 week ago.  She has been dealing with an upper respiratory infection and head pain secondary to coughing.  Location: lower Character:  aching  Progression of issue:  is unchanged Associated symptoms: None Denies bowel/bladder incontinence or weakness Treatment: to date has been OTC NSAIDS.   Neurovascular symptoms: no  ROS: Musculoskeletal/Extremities: +back pain Neurologic: no numbness, tingling no weakness   Past Medical History:  Diagnosis Date  . Arthritis    BACK AND SHOULDERS  . Bronchitis 07-14-11  . Cataract immature BILATERAL  . Depressive disorder, not elsewhere classified   . Dysphagia   . History of esophageal reflux   . History of migraine headaches   . History of pneumonia 05-14-11  . Obesity, unspecified   . Palpitations   . Pure hypercholesterolemia   . Unspecified essential hypertension   . Unspecified venous (peripheral) insufficiency     Objective:  VITAL SIGNS: BP (!) 148/71   Pulse 70   Temp 98.4 F (36.9 C) (Oral)   Resp 18   LMP 07/23/2004   SpO2 100%  Constitutional: Well formed, well developed. No acute distress. HENT: Normocephalic, atraumatic.  Thorax & Lungs:  No accessory muscle use Extremities: No clubbing. No cyanosis. No edema.  Skin: Warm. Dry. No erythema. No rash.  Musculoskeletal: low back.   Normal active range of motion: yes.   Normal passive range of motion: yes Tenderness to palpation: yes Deformity: no Ecchymosis: no Straight leg test: negative for  Poor range of motion of hamstrings bilaterally Neurologic: Normal sensory function. No focal deficits noted. DTR's  equal and symmetry in LE's. No clonus. Psychiatric: Normal mood. Age appropriate judgment and insight. Alert & oriented x 3.    Assessment:  Acute right-sided low back pain without sciatica  Plan: Continue Tylenol and anti-inflammatories.  Tramadol given for breakthrough pain.  Heat, ice, home stretches/exercises. Follow-up with Dr. Sharlet Salina in the next 2-3 weeks if symptoms fail to improve. The patient voiced understanding and agreement to the plan.     Amy Williamson, [place] 11/09/17 2156

## 2017-11-09 NOTE — ED Triage Notes (Signed)
Pt seen in [place] 1 wk ago for bronchitis.  STates cough resolved, but now having significant right mid-back pain wrapping around to right mid ribcage.  States muscle relaxer not helping.

## 2017-11-21 ENCOUNTER — Ambulatory Visit: Payer: Medicare Other | Admitting: Internal Medicine

## 2017-11-21 ENCOUNTER — Ambulatory Visit (INDEPENDENT_AMBULATORY_CARE_PROVIDER_SITE_OTHER)
Admission: RE | Admit: 2017-11-21 | Discharge: 2017-11-21 | Disposition: A | Discharge status: Home / Self Care | Payer: Medicare Other | Source: Ambulatory Visit | Attending: Internal Medicine | Admitting: Internal Medicine

## 2017-11-21 ENCOUNTER — Encounter: Payer: Self-pay | Admitting: Internal Medicine

## 2017-11-21 VITALS — BP 150/80 | HR 83 | Temp 98.3°F | Ht 60.0 in | Wt 207.0 lb

## 2017-11-21 DIAGNOSIS — M5441 Lumbago with sciatica, right side: Secondary | ICD-10-CM | POA: Diagnosis not present

## 2017-11-21 NOTE — Assessment & Plan Note (Signed)
Lumbar x-rays done today and encouraged to continue with chiropractor to see if they can help.

## 2017-11-21 NOTE — Progress Notes (Signed)
   Subjective:    Patient ID: Amy Williamson, female    DOB: [birth year], 67 y.o.   MRN: [id number]  HPI The patient is a 67 YO female coming in for possible x-rays. She wants to get them done for a chiropractor. She has been suffering from low back pain for about 1 month. She saw urgent care about 2 weeks ago and did prednisone course. This did help her breathing but not her back pain. She then was given muscle relaxers which did not work. She then went back to urgent care and was given tramadol which made her constipated and loopy so she stopped. She is now taking ibuprofen. Went to chiropractor and they need x-rays to treat her but they did electric therapy and this helped significantly.   Review of Systems  Constitutional: Positive for activity change. Negative for appetite change, chills, fatigue, fever and unexpected weight change.  Respiratory: Negative.   Cardiovascular: Negative.   Gastrointestinal: Negative.   Musculoskeletal: Positive for arthralgias, back pain and myalgias. Negative for gait problem and joint swelling.  Skin: Negative.   Neurological: Negative.       Objective:   Physical Exam  Constitutional: She is oriented to person, place, and time. She appears well-developed and well-nourished.  HENT:  Head: Normocephalic and atraumatic.  Eyes: EOM are normal.  Neck: Normal range of motion.  Cardiovascular: Normal rate and regular rhythm.  Pulmonary/Chest: Effort normal and breath sounds normal. No respiratory distress. She has no wheezes. She has no rales.  Abdominal: Soft. She exhibits no distension. There is no tenderness. There is no rebound.  Musculoskeletal: She exhibits tenderness. She exhibits no edema.  Low back pain  Neurological: She is alert and oriented to person, place, and time. Coordination normal.  Skin: Skin is warm and dry.   Vitals:   11/21/17 0915  BP: (!) 150/80  Pulse: 83  Temp: 98.3 F (36.8 C)  TempSrc: Oral  SpO2: 98%  Weight: 207 lb  (93.9 kg)  Height: 5' (1.524 m)      Assessment & Plan:

## 2017-11-21 NOTE — Patient Instructions (Signed)
We will check the x-rays today. You can call medical records to get a copy of the x-rays.

## 2017-12-12 ENCOUNTER — Ambulatory Visit
Admission: RE | Admit: 2017-12-12 | Discharge: 2017-12-12 | Disposition: A | Discharge status: Home / Self Care | Payer: Medicare Other | Source: Ambulatory Visit | Attending: Obstetrics & Gynecology | Admitting: Obstetrics & Gynecology

## 2017-12-12 DIAGNOSIS — Z1231 Encounter for screening mammogram for malignant neoplasm of breast: Secondary | ICD-10-CM

## 2018-01-29 ENCOUNTER — Other Ambulatory Visit: Payer: Self-pay | Admitting: Internal Medicine

## 2018-02-18 NOTE — Progress Notes (Signed)
67 y.o. G2P2 MarriedCaucasianF here for annual exam.  Had some issues with plantar fasciitis in her left foot as well as right sided back pain.  Has seen a chiropractor and this has really helped the back pain.  Husband's house is finally cleaned out.  It sat empty for a few years after they got married when he moved in with Mrs. Kreamer.  They know a couple that wants to buy it.  They are doing some work on it and then it will sell.    Denies vaginal bleeding.    Patient's last menstrual period was 07/23/2004.          Sexually active: No.  The current method of family planning is post menopausal status.    Exercising: No.   Smoker:  no  Health Maintenance: Pap:  08/04/15 ASCUS. HR HPV:neg  History of abnormal Pap:  no MMG:  12/12/17 BIRADS1:Neg  Colonoscopy:  09/09/13 Neg  BMD:   12/08/15 Normal  TDaP:  2017 Pneumonia vaccine(s):  2016 Shingrix:   10/18 and 3/19 Hep C testing: 08/04/15 Neg  Screening Labs: PCP   reports that she has never smoked. She has never used smokeless tobacco. She reports that she does not drink alcohol or use drugs.  Past Medical History:  Diagnosis Date  . Arthritis    BACK AND SHOULDERS  . Bronchitis 07-14-11  . Cataract immature BILATERAL  . Depressive disorder, not elsewhere classified   . Dysphagia   . History of esophageal reflux   . History of migraine headaches   . History of pneumonia 05-14-11  . Obesity, unspecified   . Palpitations   . Plantar fasciitis 08/2017   left foot   . Pure hypercholesterolemia   . Unspecified essential hypertension   . Unspecified venous (peripheral) insufficiency     Past Surgical History:  Procedure Laterality Date  . CARPAL TUNNEL RELEASE Right 2004  . CARPAL TUNNEL RELEASE Left 2006  . CESAREAN SECTION  0086,7619  . EAR CYST EXCISION  08/03/2011   patient states this is incorrect  . KNEE ARTHROSCOPY    . KNEE ARTHROSCOPY W/ MENISCAL REPAIR  08/03/2011   with popliteal cyst excition  . KNEE ARTHROSCOPY  WITH EXCISION BAKER'S CYST    . PULLEY RELEASE RIGHT TRIGGER FINGER Right 01/08/2011  . TRIGGER FINGER RELEASE Right 04/20/13  . TRIGGER FINGER RELEASE Left 05/06/14  . TUBAL LIGATION  1977   with second cesarean section    Current Outpatient Medications  Medication Sig Dispense Refill  . amLODipine (NORVASC) 5 MG tablet TAKE 1 TABLET BY MOUTH EVERY DAY 90 tablet 1  . Ibuprofen 200 MG CAPS Take by mouth as needed.    [name] omeprazole (PRILOSEC) 20 MG capsule TAKE 1 CAPSULE BY MOUTH EVERY DAY 90 capsule 1   No current facility-administered medications for this visit.     Family History  Problem Relation Age of Onset  . Heart failure Mother   . Osteoporosis Mother   . Hyperlipidemia Mother   . Hypertension Mother   . Heart disease Father   . Clotting disorder Father   . Liver cancer Brother   . Liver disease Maternal Aunt        x2  . Colon cancer Neg Hx     Review of Systems  Constitutional:       Weight gain   Cardiovascular: Positive for leg swelling.  Genitourinary: Positive for frequency.       Night urination   Musculoskeletal: Positive for  myalgias.  All other systems reviewed and are negative.   Exam:   BP 138/70 (BP Location: Right Arm, Patient Position: Sitting, Cuff Size: Large)   Pulse 84   Resp 16   Ht 5' (1.524 m)   Wt 207 lb 9.6 oz (94.2 kg)   LMP 07/23/2004   BMI 40.54 kg/m    Height: 5' (152.4 cm)  Ht Readings from Last 3 Encounters:  02/20/18 5' (1.524 m)  11/21/17 5' (1.524 m)  10/17/17 5' (1.524 m)    General appearance: alert, cooperative and appears stated age Head: Normocephalic, without obvious abnormality, atraumatic Neck: no adenopathy, supple, symmetrical, trachea midline and thyroid normal to inspection and palpation Lungs: clear to auscultation bilaterally Breasts: normal appearance, no masses or tenderness Heart: regular rate and rhythm Abdomen: soft, non-tender; bowel sounds normal; no masses,  no organomegaly Extremities:  extremities normal, atraumatic, no cyanosis or edema Skin: Skin color, texture, turgor normal. No rashes or lesions Lymph nodes: Cervical, supraclavicular, and axillary nodes normal. No abnormal inguinal nodes palpated Neurologic: Grossly normal   Pelvic: External genitalia:  no lesions              Urethra:  normal appearing urethra with no masses, tenderness or lesions              Bartholins and Skenes: normal                 Vagina: normal appearing vagina with normal color and discharge, no lesions              Cervix: no lesions              Pap taken: Yes.   Bimanual Exam:  Uterus:  normal size, contour, position, consistency, mobility, non-tender              Adnexa: normal adnexa and no mass, fullness, tenderness               Rectovaginal: Confirms               Anus:  normal sphincter tone, no lesions  Chaperone was present for exam.  A:  Well Woman with normal exam PMP, no HRT Hypertension Weight gain  P:   Mammogram guidelines reviewed pap smear obtained today Lab work and vaccines UTD BMD and colonoscopy are both UTD return annually or prn

## 2018-02-20 ENCOUNTER — Encounter: Payer: Self-pay | Admitting: Obstetrics & Gynecology

## 2018-02-20 ENCOUNTER — Other Ambulatory Visit: Payer: Self-pay

## 2018-02-20 ENCOUNTER — Ambulatory Visit (INDEPENDENT_AMBULATORY_CARE_PROVIDER_SITE_OTHER): Payer: Medicare Other | Admitting: Obstetrics & Gynecology

## 2018-02-20 ENCOUNTER — Encounter

## 2018-02-20 ENCOUNTER — Other Ambulatory Visit (HOSPITAL_COMMUNITY)
Admission: RE | Admit: 2018-02-20 | Discharge: 2018-02-20 | Disposition: A | Discharge status: Home / Self Care | Payer: Medicare Other | Source: Ambulatory Visit | Attending: Obstetrics & Gynecology | Admitting: Obstetrics & Gynecology

## 2018-02-20 VITALS — BP 138/70 | HR 84 | Resp 16 | Ht 60.0 in | Wt 207.6 lb

## 2018-02-20 DIAGNOSIS — R635 Abnormal weight gain: Secondary | ICD-10-CM | POA: Diagnosis not present

## 2018-02-20 DIAGNOSIS — E7800 Pure hypercholesterolemia, unspecified: Secondary | ICD-10-CM | POA: Insufficient documentation

## 2018-02-20 DIAGNOSIS — I10 Essential (primary) hypertension: Secondary | ICD-10-CM | POA: Insufficient documentation

## 2018-02-20 DIAGNOSIS — I872 Venous insufficiency (chronic) (peripheral): Secondary | ICD-10-CM | POA: Diagnosis not present

## 2018-02-20 DIAGNOSIS — Z8249 Family history of ischemic heart disease and other diseases of the circulatory system: Secondary | ICD-10-CM | POA: Insufficient documentation

## 2018-02-20 DIAGNOSIS — Z6841 Body Mass Index (BMI) 40.0 and over, adult: Secondary | ICD-10-CM | POA: Insufficient documentation

## 2018-02-20 DIAGNOSIS — Z800 Family history of malignant neoplasm of digestive organs: Secondary | ICD-10-CM | POA: Insufficient documentation

## 2018-02-20 DIAGNOSIS — Z8262 Family history of osteoporosis: Secondary | ICD-10-CM | POA: Insufficient documentation

## 2018-02-20 DIAGNOSIS — Z01419 Encounter for gynecological examination (general) (routine) without abnormal findings: Secondary | ICD-10-CM | POA: Insufficient documentation

## 2018-02-20 DIAGNOSIS — Z79899 Other long term (current) drug therapy: Secondary | ICD-10-CM | POA: Diagnosis not present

## 2018-02-20 DIAGNOSIS — M1990 Unspecified osteoarthritis, unspecified site: Secondary | ICD-10-CM | POA: Insufficient documentation

## 2018-02-20 DIAGNOSIS — K219 Gastro-esophageal reflux disease without esophagitis: Secondary | ICD-10-CM | POA: Diagnosis not present

## 2018-02-21 LAB — CYTOLOGY - PAP
Adequacy: ABSENT
Diagnosis: NEGATIVE

## 2018-04-02 ENCOUNTER — Encounter: Payer: Self-pay | Admitting: Internal Medicine

## 2018-04-02 ENCOUNTER — Ambulatory Visit: Payer: Medicare Other | Admitting: Internal Medicine

## 2018-04-02 DIAGNOSIS — R059 Cough, unspecified: Secondary | ICD-10-CM

## 2018-04-02 DIAGNOSIS — R05 Cough: Secondary | ICD-10-CM | POA: Diagnosis not present

## 2018-04-02 MED ORDER — DOXYCYCLINE HYCLATE 100 MG PO TABS: 100.0000 mg | ORAL_TABLET | Freq: Two times a day (BID) | ORAL | 0 refills | Status: DC

## 2018-04-02 NOTE — Assessment & Plan Note (Signed)
Rx for doxycycline for likely bacterial infection. Will continue otc cough medicine and nasacort and allergy medicine.

## 2018-04-02 NOTE — Progress Notes (Signed)
   Subjective:    Patient ID: Amy Williamson, female    DOB: 10/13/1950, 67 y.o.   MRN: [id number]  HPI The patient is a 67 YO female coming in for cough and wheezing. Started about 7-8 days ago. Started with nasal drainage, sore throat, coughing. She has been fatigued and some chills since that time. Overall it is worsening. She is taking nasacort and allergy medicine otc for the last 5 days without improvement. She is having wheezing and SOB. She has decreased endurance. Worsening GERD.   Review of Systems  Constitutional: Positive for activity change, appetite change, chills and fatigue. Negative for fever and unexpected weight change.  HENT: Positive for congestion, postnasal drip, rhinorrhea and sinus pressure. Negative for ear discharge, ear pain, sinus pain, sneezing, sore throat, tinnitus, trouble swallowing and voice change.   Eyes: Negative.   Respiratory: Positive for cough, shortness of breath and wheezing. Negative for chest tightness.   Cardiovascular: Negative.   Gastrointestinal: Negative.   Musculoskeletal: Negative for myalgias.  Neurological: Negative.       Objective:   Physical Exam  Constitutional: She is oriented to person, place, and time. She appears well-developed and well-nourished.  HENT:  Head: Normocephalic and atraumatic.  Oropharynx with redness and clear drainage, nose with swollen turbinates, TMs normal bilaterally  Eyes: EOM are normal.  Neck: Normal range of motion. No thyromegaly present.  Cardiovascular: Normal rate and regular rhythm.  Pulmonary/Chest: Effort normal. No respiratory distress. She has no wheezes. She has no rales.  Mild rhonchi in the bases, partially clears with cough  Abdominal: Soft.  Musculoskeletal: She exhibits no tenderness.  Lymphadenopathy:    She has no cervical adenopathy.  Neurological: She is alert and oriented to person, place, and time.  Skin: Skin is warm and dry.   Vitals:   04/02/18 1009  BP: (!) 150/80    Pulse: 71  Temp: 98.5 F (36.9 C)  TempSrc: Oral  SpO2: 99%  Weight: 206 lb (93.4 kg)  Height: 5' (1.524 m)      Assessment & Plan:

## 2018-04-02 NOTE — Patient Instructions (Signed)
We have sent in the antibiotic doxycycline 1 pill twice a day for 1 week to help the symptoms.

## 2018-04-22 ENCOUNTER — Other Ambulatory Visit: Payer: Self-pay | Admitting: Internal Medicine

## 2018-04-26 ENCOUNTER — Other Ambulatory Visit: Payer: Self-pay | Admitting: Internal Medicine

## 2018-05-09 ENCOUNTER — Other Ambulatory Visit (INDEPENDENT_AMBULATORY_CARE_PROVIDER_SITE_OTHER): Payer: Medicare Other

## 2018-05-09 ENCOUNTER — Ambulatory Visit: Payer: Medicare Other | Admitting: Internal Medicine

## 2018-05-09 ENCOUNTER — Encounter: Payer: Self-pay | Admitting: Internal Medicine

## 2018-05-09 VITALS — BP 138/78 | HR 75 | Temp 98.6°F | Ht 60.0 in | Wt 207.0 lb

## 2018-05-09 DIAGNOSIS — E785 Hyperlipidemia, unspecified: Secondary | ICD-10-CM | POA: Diagnosis not present

## 2018-05-09 DIAGNOSIS — I10 Essential (primary) hypertension: Secondary | ICD-10-CM

## 2018-05-09 DIAGNOSIS — R7301 Impaired fasting glucose: Secondary | ICD-10-CM

## 2018-05-09 DIAGNOSIS — Z23 Encounter for immunization: Secondary | ICD-10-CM | POA: Diagnosis not present

## 2018-05-09 DIAGNOSIS — Z0000 Encounter for general adult medical examination without abnormal findings: Secondary | ICD-10-CM | POA: Diagnosis not present

## 2018-05-09 DIAGNOSIS — K219 Gastro-esophageal reflux disease without esophagitis: Secondary | ICD-10-CM | POA: Diagnosis not present

## 2018-05-09 LAB — COMPREHENSIVE METABOLIC PANEL
ALBUMIN: 4.4 g/dL (ref 3.5–5.2)
ALK PHOS: 76 U/L (ref 39–117)
ALT: 15 U/L (ref 0–35)
AST: 13 U/L (ref 0–37)
BUN: 24 mg/dL — ABNORMAL HIGH (ref 6–23)
CO2: 30 mEq/L (ref 19–32)
Calcium: 9.8 mg/dL (ref 8.4–10.5)
Chloride: 102 mEq/L (ref 96–112)
Creatinine, Ser: 0.77 mg/dL (ref 0.40–1.20)
GFR: 79.11 mL/min (ref >60.00)
Glucose, Bld: 102 mg/dL — ABNORMAL HIGH (ref 70–99)
Potassium: 4.2 mEq/L (ref 3.5–5.1)
Sodium: 141 mEq/L (ref 135–145)
TOTAL PROTEIN: 7.1 g/dL (ref 6.0–8.3)
Total Bilirubin: 0.6 mg/dL (ref 0.2–1.2)

## 2018-05-09 LAB — CBC
HEMATOCRIT: 39.8 % (ref 36.0–46.0)
HEMOGLOBIN: 13.5 g/dL (ref 12.0–15.0)
MCHC: 34 g/dL (ref 30.0–36.0)
MCV: 82.6 fl (ref 78.0–100.0)
Platelets: 209 10*3/uL (ref 150.0–400.0)
RBC: 4.81 Mil/uL (ref 3.87–5.11)
RDW: 14.6 % (ref 11.5–15.5)
WBC: 7.7 10*3/uL (ref 4.0–10.5)

## 2018-05-09 LAB — LIPID PANEL
CHOLESTEROL: 221 mg/dL — AB (ref 0–200)
HDL: 64.3 mg/dL (ref >39.00)
LDL Cholesterol: 139 mg/dL — ABNORMAL HIGH (ref 0–99)
NonHDL: 156.92
TRIGLYCERIDES: 91 mg/dL (ref 0.0–149.0)
Total CHOL/HDL Ratio: 3
VLDL: 18.2 mg/dL (ref 0.0–40.0)

## 2018-05-09 LAB — HEMOGLOBIN A1C: Hgb A1c MFr Bld: 5.8 % (ref 4.6–6.5)

## 2018-05-09 MED ORDER — PANTOPRAZOLE SODIUM 40 MG PO TBEC: 40.0000 mg | DELAYED_RELEASE_TABLET | Freq: Every day | ORAL | 3 refills | Status: DC

## 2018-05-09 NOTE — Assessment & Plan Note (Signed)
Weight is increasing overall and asked her to start cutting back on portions and track calories.

## 2018-05-09 NOTE — Assessment & Plan Note (Signed)
BP at goal on amlodipine. Checking CMP and adjust as needed.  

## 2018-05-09 NOTE — Assessment & Plan Note (Signed)
Change omeprazole to protonix for some lack of control of symptoms. Prior stricture.

## 2018-05-09 NOTE — Progress Notes (Signed)
   Subjective:    Patient ID: Amy Williamson, female    DOB: [birth year], 67 y.o.   MRN: [id number]  HPI Here for medicare wellness and physical, no new complaints. Please see A/P for status and treatment of chronic medical problems.   Diet: heart healthy Physical activity: sedentary Depression/mood screen: negative Hearing: intact to whispered voice Visual acuity: grossly normal, performs annual eye exam  ADLs: capable Fall risk: none Home safety: good Cognitive evaluation: intact to orientation, naming, recall and repetition EOL planning: adv directives discussed  I have personally reviewed and have noted 1. The patient's medical and social history - reviewed today no changes 2. Their use of alcohol, tobacco or illicit drugs 3. Their current medications and supplements 4. The patient's functional ability including ADL's, fall risks, home safety risks and hearing or visual impairment. 5. Diet and physical activities 6. Evidence for depression or mood disorders 7. Care team reviewed and updated (available in snapshot)  Review of Systems  Constitutional: Negative.   HENT: Negative.   Eyes: Negative.   Respiratory: Negative for cough, chest tightness and shortness of breath.   Cardiovascular: Negative for chest pain, palpitations and leg swelling.  Gastrointestinal: Negative for abdominal distention, abdominal pain, constipation, diarrhea, nausea and vomiting.       GERD  Musculoskeletal: Negative.   Skin: Negative.   Neurological: Negative.   Psychiatric/Behavioral: Negative.       Objective:   Physical Exam  Constitutional: She is oriented to person, place, and time. She appears well-developed and well-nourished.  HENT:  Head: Normocephalic and atraumatic.  Eyes: EOM are normal.  Neck: Normal range of motion.  Cardiovascular: Normal rate and regular rhythm.  Pulmonary/Chest: Effort normal and breath sounds normal. No respiratory distress. She has no wheezes. She has no  rales.  Abdominal: Soft. Bowel sounds are normal. She exhibits no distension. There is no tenderness. There is no rebound.  Musculoskeletal: She exhibits no edema.  Neurological: She is alert and oriented to person, place, and time. Coordination normal.  Skin: Skin is warm and dry.  Psychiatric: She has a normal mood and affect.   Vitals:   05/09/18 0823  BP: 138/78  Pulse: 75  Temp: 98.6 F (37 C)  TempSrc: Oral  SpO2: 98%  Weight: 207 lb (93.9 kg)  Height: 5' (1.524 m)      Assessment & Plan:  Flu shot given at visit

## 2018-05-09 NOTE — Patient Instructions (Signed)

## 2018-05-09 NOTE — Assessment & Plan Note (Signed)
Flu shot given. Pneumonia complete. Shingrix complete. Tetanus up to date. Colonoscopy up to date. Mammogram up to date, pap smear up to date and dexa up to date. Counseled about sun safety and mole surveillance. Counseled about the dangers of distracted driving. Given 10 year screening recommendations.

## 2018-05-09 NOTE — Assessment & Plan Note (Signed)
Checking lipid panel and adjust as needed. Off medications currently.

## 2018-08-04 ENCOUNTER — Ambulatory Visit: Payer: Medicare Other | Admitting: Internal Medicine

## 2018-08-04 ENCOUNTER — Encounter: Payer: Self-pay | Admitting: Internal Medicine

## 2018-08-04 VITALS — BP 180/90 | HR 84 | Temp 98.9°F | Ht 60.0 in

## 2018-08-04 DIAGNOSIS — R059 Cough, unspecified: Secondary | ICD-10-CM

## 2018-08-04 DIAGNOSIS — R05 Cough: Secondary | ICD-10-CM

## 2018-08-04 MED ORDER — METHYLPREDNISOLONE ACETATE 40 MG/ML IJ SUSP
40.0000 mg | Freq: Once | INTRAMUSCULAR | Status: AC
  Administered 2018-08-04: 40 mg via INTRAMUSCULAR

## 2018-08-04 MED ORDER — OMEPRAZOLE 20 MG PO CPDR: 20.0000 mg | DELAYED_RELEASE_CAPSULE | Freq: Every day | ORAL | 3 refills | Status: DC

## 2018-08-04 MED ORDER — DOXYCYCLINE HYCLATE 100 MG PO TABS: 100.0000 mg | ORAL_TABLET | Freq: Two times a day (BID) | ORAL | 0 refills | Status: DC

## 2018-08-04 NOTE — Progress Notes (Signed)
   Subjective:   Patient ID: Amy Williamson, female    DOB: Apr 16, 1951, 68 y.o.   MRN: [id number]  HPI  The patient is a 68 y.o. female coming in for cold symptoms. Started about 2 weeks ago. Main symptoms are: cough and SOB and wheezing, green sputum. Does have fevers and chills. Denies sick contacts. Overall it is worsening. Has tried mucinex and tussin otc which have not helped.   Review of Systems  Constitutional: Positive for activity change, appetite change and chills. Negative for fatigue, fever and unexpected weight change.  HENT: Positive for congestion, postnasal drip, rhinorrhea and sinus pressure. Negative for ear discharge, ear pain, sinus pain, sneezing, sore throat, tinnitus, trouble swallowing and voice change.   Eyes: Negative.   Respiratory: Positive for cough, shortness of breath and wheezing. Negative for chest tightness.   Cardiovascular: Negative.   Gastrointestinal: Negative.   Musculoskeletal: Positive for myalgias.  Neurological: Negative.     Objective:  Physical Exam Constitutional:      Appearance: She is well-developed.  HENT:     Head: Normocephalic and atraumatic.     Comments: Oropharynx with redness and clear drainage, nose with swollen turbinates, TMs normal bilaterally.  Neck:     Musculoskeletal: Normal range of motion.     Thyroid: No thyromegaly.  Cardiovascular:     Rate and Rhythm: Normal rate and regular rhythm.  Pulmonary:     Effort: Pulmonary effort is normal. No respiratory distress.     Breath sounds: Rhonchi present. No wheezing or rales.  Abdominal:     Palpations: Abdomen is soft.  Musculoskeletal:        General: Tenderness present.  Lymphadenopathy:     Cervical: No cervical adenopathy.  Skin:    General: Skin is warm and dry.  Neurological:     Mental Status: She is alert and oriented to person, place, and time.     Vitals:   08/04/18 1520  BP: (!) 180/90  Pulse: 84  Temp: 98.9 F (37.2 C)  TempSrc: Oral  SpO2:  98%  Height: 5' (1.524 m)    Assessment & Plan:  Depo-medrol 40 mg IM

## 2018-08-04 NOTE — Patient Instructions (Signed)
We have sent in doxycycline to take 1 pill twice a day for 1 week.  

## 2018-08-05 NOTE — Assessment & Plan Note (Signed)
Depo-medrol 40 mg IM given at visit and rx for doxycycline. Advised of otc help as well.

## 2018-08-06 ENCOUNTER — Ambulatory Visit: Payer: Self-pay

## 2018-08-06 ENCOUNTER — Telehealth: Payer: Self-pay

## 2018-08-06 MED ORDER — CEFDINIR 300 MG PO CAPS: 300.0000 mg | ORAL_CAPSULE | Freq: Two times a day (BID) | ORAL | 0 refills | Status: DC

## 2018-08-06 NOTE — Telephone Encounter (Signed)
Pt called stating that she has had severe burning pain in her chest from Doxycycline.  She states that she already is on and acid reducer.  She states that she has had problems in the past with other antibiotics that cause her esophagus issues. She is requesting a different antibiotic. Conversation will be routed to office. Pt has taken morning dose and is waiting for instruction from office before taking next dose.  Reason for Disposition . Caller has NON-URGENT medication question about med that PCP prescribed and triager unable to answer question  Answer Assessment - Initial Assessment Questions 1. SYMPTOMS: "Do you have any symptoms?"     Heart burn, chest burns 2. SEVERITY: If symptoms are present, ask "Are they mild, moderate or severe?"     severe  Protocols used: MEDICATION QUESTION CALL-A-AH

## 2018-08-06 NOTE — Telephone Encounter (Signed)
Copied from [place] [place] [id number]. Topic: General - Other >> Aug 06, 2018  2:33 PM Percell Belt A wrote: Reason for CRM:  Pt just called in and state that she just got [facility] and [facility] not [facility] the cefdinir (OMNICEF) 300 MG capsule [[id number]] that was sent today.  She would like to know if it can be called in again or resent  [id number] [address], [place]. [phone] (Phone) >> Aug 06, 2018  3:36 PM Morey Hummingbird wrote: Med says "Phone In", Please advise

## 2018-08-06 NOTE — Telephone Encounter (Signed)
Sent in alternate antibiotic. 

## 2018-08-06 NOTE — Telephone Encounter (Signed)
Patient informed Rx was sent

## 2018-08-06 NOTE — Telephone Encounter (Signed)
Resent medication

## 2018-08-12 ENCOUNTER — Ambulatory Visit: Payer: Self-pay

## 2018-08-12 NOTE — Telephone Encounter (Signed)
Incoming call from Patient with complaint of coughing and wheezing.  Bringing up yellow tinged mucous.  Patient was seen last Monday by Dr.  Sharlet Salina.  Onset was about a week and a half ago States Sx are constant.  More wheezing at night .  Severity Moderate to severe.  Reports High blood pressure. Pressure.  And a Hx of bronchitis.  Has sinus drainage also .  Patient Schedule appointment with Caesar Chestnut  08/13/18 @ 1:45pm Patient voices understanding.  Reviewed care advice with Patient.  Voiced understanding .    Reason for Disposition . Cough has been present for > 3 weeks  Answer Assessment - Initial Assessment Questions 1. RESPIRATORY STATUS: "Describe your breathing?" (e.g., wheezing, shortness of breath, unable to speak, severe coughing)      Wheezing and coughing 2. ONSET: "When did this breathing problem begin?"      Week and half hasnt stopped 3. PATTERN "Does the difficult breathing come and go, or has it been constant since it started?"      Constant whee more at night 4. SEVERITY: "How bad is your breathing?" (e.g., mild, moderate, severe)    - MILD: No SOB at rest, mild SOB with walking, speaks normally in sentences, can lay down, no retractions, pulse < 100.    - MODERATE: SOB at rest, SOB with minimal exertion and prefers to sit, cannot lie down flat, speaks in phrases, mild retractions, audible wheezing, pulse 100-120.    - SEVERE: Very SOB at rest, speaks in single words, struggling to breathe, sitting hunched forward, retractions, pulse > 120      Moderate and severe 5. RECURRENT SYMPTOM: "Have you had difficulty breathing before?" If so, ask: "When was the last time?" and "What happened that time?"      yes 6. CARDIAC HISTORY: "Do you have any history of heart disease?" (e.g., heart attack, angina, bypass surgery, angioplasty)      Denies, high blood pressure meds 7. LUNG HISTORY: "Do you have any history of lung disease?"  (e.g., pulmonary embolus, asthma, emphysema)    bronchitis 8. CAUSE: "What do you think is causing the breathing problem?"      9. OTHER SYMPTOMS: "Do you have any other symptoms? (e.g., dizziness, runny nose, cough, chest pain, fever)     Cough, sinus drainage 10. PREGNANCY: "Is there any chance you are pregnant?" "When was your last menstrual period?"       na 11. TRAVEL: "Have you traveled out of the country in the last month?" (e.g., travel history, exposures)       na  Protocols used: COUGH - ACUTE PRODUCTIVE-A-AH, BREATHING DIFFICULTY-A-AH

## 2018-08-13 ENCOUNTER — Ambulatory Visit (INDEPENDENT_AMBULATORY_CARE_PROVIDER_SITE_OTHER)
Admission: RE | Admit: 2018-08-13 | Discharge: 2018-08-13 | Disposition: A | Discharge status: Home / Self Care | Payer: Medicare Other | Source: Ambulatory Visit | Attending: Nurse Practitioner | Admitting: Nurse Practitioner

## 2018-08-13 ENCOUNTER — Ambulatory Visit: Payer: Medicare Other | Admitting: Nurse Practitioner

## 2018-08-13 ENCOUNTER — Encounter: Payer: Self-pay | Admitting: Nurse Practitioner

## 2018-08-13 VITALS — BP 148/80 | HR 76 | Temp 98.7°F | Ht 60.0 in | Wt 204.0 lb

## 2018-08-13 DIAGNOSIS — R059 Cough, unspecified: Secondary | ICD-10-CM

## 2018-08-13 DIAGNOSIS — J069 Acute upper respiratory infection, unspecified: Secondary | ICD-10-CM | POA: Diagnosis not present

## 2018-08-13 DIAGNOSIS — R05 Cough: Secondary | ICD-10-CM

## 2018-08-13 MED ORDER — AMOXICILLIN-POT CLAVULANATE 875-125 MG PO TABS: 1.0000 | ORAL_TABLET | Freq: Two times a day (BID) | ORAL | 0 refills | Status: DC

## 2018-08-13 MED ORDER — BENZONATATE 100 MG PO CAPS: 100.0000 mg | ORAL_CAPSULE | Freq: Two times a day (BID) | ORAL | 0 refills | Status: DC | PRN

## 2018-08-13 NOTE — Patient Instructions (Signed)
Head downstairs for chest xray  Take augmentin as prescribed  Please return in about 1 week for follow up visit

## 2018-08-13 NOTE — Progress Notes (Signed)
Amy Williamson is a 68 y.o. female with the following history as recorded in [employer]:  Patient Active Problem List   Diagnosis Date Noted  . Cough 04/02/2018  . Routine general medical examination at a health care facility 04/02/2016  . Other dysphagia 09/21/2013  . Palpitations 03/24/2013  . Morbid obesity ([place]) 03/24/2013  . DJD (degenerative joint disease) 03/24/2013  . GERD (gastroesophageal reflux disease) 02/27/2012  . Hyperlipidemia 01/21/2008  . Essential hypertension 01/21/2008    Current Outpatient Medications  Medication Sig Dispense Refill  . amLODipine (NORVASC) 5 MG tablet TAKE 1 TABLET BY MOUTH EVERY DAY 90 tablet 1  . Ibuprofen 200 MG CAPS Take by mouth as needed.    [name] omeprazole (PRILOSEC) 20 MG capsule Take 1 capsule (20 mg total) by mouth daily. 90 capsule 3  . amoxicillin-clavulanate (AUGMENTIN) 875-125 MG tablet Take 1 tablet by mouth 2 (two) times daily. 14 tablet 0  . benzonatate (TESSALON) 100 MG capsule Take 1 capsule (100 mg total) by mouth 2 (two) times daily as needed for cough. 20 capsule 0   No current facility-administered medications for this visit.     Allergies: Atenolol; Avelox [moxifloxacin hcl in nacl]; Azithromycin; and Doxycycline  Past Medical History:  Diagnosis Date  . Arthritis    BACK AND SHOULDERS  . Bronchitis 07-14-11  . Cataract immature BILATERAL  . Depressive disorder, not elsewhere classified   . Dysphagia   . History of esophageal reflux   . History of migraine headaches   . History of pneumonia 05-14-11  . Obesity, unspecified   . Palpitations   . Plantar fasciitis 08/2017   left foot   . Pure hypercholesterolemia   . Unspecified essential hypertension   . Unspecified venous (peripheral) insufficiency     Past Surgical History:  Procedure Laterality Date  . CARPAL TUNNEL RELEASE Right 2004  . CARPAL TUNNEL RELEASE Left 2006  . CESAREAN SECTION  3810,1751  . EAR CYST EXCISION  08/03/2011   patient states this is  incorrect  . KNEE ARTHROSCOPY    . KNEE ARTHROSCOPY W/ MENISCAL REPAIR  08/03/2011   with popliteal cyst excition  . KNEE ARTHROSCOPY WITH EXCISION BAKER'S CYST    . PULLEY RELEASE RIGHT TRIGGER FINGER Right 01/08/2011  . TRIGGER FINGER RELEASE Right 04/20/13  . TRIGGER FINGER RELEASE Left 05/06/14  . TUBAL LIGATION  1977   with second cesarean section    Family History  Problem Relation Age of Onset  . Heart failure Mother   . Osteoporosis Mother   . Hyperlipidemia Mother   . Hypertension Mother   . Heart disease Father   . Clotting disorder Father   . Liver cancer Brother   . Liver disease Maternal Aunt        x2  . Colon cancer Neg Hx     Social History   Tobacco Use  . Smoking status: Never Smoker  . Smokeless tobacco: Never Used  Substance Use Topics  . Alcohol use: No     Subjective:  Amy Williamson is here today requesting evaluation of acute complaint of cough/cold symptoms, which first began about 2 weeks ago. She was seen by her PCP 08/04/18 and given depo IM, started on doxycycline course, she took doxy for 2 days but caused gerd symptoms to worsen so she was then switched to [facility] course, which she has now completed but continues to haves: low grade fevers, malaise, sinus pressure and drainage, post nasal drip,scough with yellow mucus, shortness of  breath, wheezing, says her symptoms are just not getting any better. Denies: syncope, confusion, headaches, loss of appetite, sore throat, chest pain, abdominal pain, nausea, vomiting, diarrhea Smoker? No  Tried at home: mucinex helps some   ROS- See HPI  Objective:  Vitals:   08/13/18 1349  BP: (!) 148/80  Pulse: 76  Temp: 98.7 F (37.1 C)  TempSrc: Oral  SpO2: 99%  Weight: 204 lb (92.5 kg)  Height: 5' (1.524 m)    General: Well developed, well nourished, in no acute distress  Skin : Warm and dry.  Head: Normocephalic and atraumatic  Eyes: Sclera and conjunctiva clear; pupils round and reactive to light;  extraocular movements intact  Ears: External normal; canals clear; tympanic membranes bulging bilaterally without effusion, erythema Oropharynx: Pink, supple. Mild posterior oropharyngeal erythema, no edema or exudate. No suspicious lesions  Neck: Supple without thyromegaly, adenopathy  Lungs: Respirations unlabored; mild wheezing throughout CVS exam: normal rate and regular rhythm.  Extremities: No edema, cyanosis, clubbing  Vessels: Symmetric bilaterally  Neurologic: Alert and oriented; speech intact; face symmetrical; moves all extremities well; CNII-XII intact without focal deficit  Psychiatric: Normal mood and affect.   Assessment:  1. Cough   2. Upper respiratory tract infection, unspecified type     Plan:   Due to continued symptoms of infection, Will prescribe additional antibiotic course. Multiple antibiotic allergies/ intolerances noted, she has been able to take augmentin in the past without any adverse side effects. Augmentin, tessalon Rx sent- medication dosing and side effects discussed Chest xray ordered, F/U with further recommendations pending results RTC in 7-10 days for follow up visit  No follow-ups on file.  Orders Placed This Encounter  Procedures  . DG Chest 2 View    Standing Status:   Future    Standing Expiration Date:   10/12/2019    Order Specific Question:   Reason for Exam (SYMPTOM  OR DIAGNOSIS REQUIRED)    Answer:   productive cough, fevers, after antibiotic course    Order Specific Question:   Preferred imaging location?    Answer:   [facility]    Order Specific Question:   Radiology Contrast Protocol - do NOT remove file path    Answer:   [email or web address]    Requested Prescriptions   Signed Prescriptions Disp Refills  . amoxicillin-clavulanate (AUGMENTIN) 875-125 MG tablet 14 tablet 0    Sig: Take 1 tablet by mouth 2 (two) times daily.  . benzonatate (TESSALON) 100 MG capsule 20 capsule 0     Sig: Take 1 capsule (100 mg total) by mouth 2 (two) times daily as needed for cough.

## 2018-08-15 ENCOUNTER — Other Ambulatory Visit: Payer: Self-pay | Admitting: Nurse Practitioner

## 2018-08-15 ENCOUNTER — Telehealth: Payer: Self-pay

## 2018-08-15 DIAGNOSIS — R059 Cough, unspecified: Secondary | ICD-10-CM

## 2018-08-15 DIAGNOSIS — R05 Cough: Secondary | ICD-10-CM

## 2018-08-15 DIAGNOSIS — R9389 Abnormal findings on diagnostic imaging of other specified body structures: Secondary | ICD-10-CM

## 2018-08-15 NOTE — Telephone Encounter (Signed)
Patient saw [name] yesterday

## 2018-08-15 NOTE — Telephone Encounter (Signed)
Copied from [place] [id number]. Topic: General - Inquiry >> Aug 15, 2018  8:28 AM Virl Axe D wrote: Reason for CRM: Pt called to inquire about results of chest xray on 08/13/18. Please advise once results are ready.

## 2018-08-22 ENCOUNTER — Encounter: Payer: Self-pay | Admitting: Internal Medicine

## 2018-08-22 ENCOUNTER — Ambulatory Visit: Payer: Medicare Other | Admitting: Internal Medicine

## 2018-08-22 VITALS — BP 160/80 | HR 71 | Temp 98.6°F | Ht 60.0 in | Wt 206.0 lb

## 2018-08-22 DIAGNOSIS — R05 Cough: Secondary | ICD-10-CM | POA: Diagnosis not present

## 2018-08-22 DIAGNOSIS — R059 Cough, unspecified: Secondary | ICD-10-CM

## 2018-08-22 MED ORDER — ALBUTEROL SULFATE (2.5 MG/3ML) 0.083% IN NEBU: 2.5000 mg | INHALATION_SOLUTION | RESPIRATORY_TRACT | 1 refills | Status: DC | PRN

## 2018-08-22 MED ORDER — PREDNISONE 20 MG PO TABS: 40.0000 mg | ORAL_TABLET | Freq: Every day | ORAL | 0 refills | Status: DC

## 2018-08-22 MED ORDER — METHYLPREDNISOLONE ACETATE 80 MG/ML IJ SUSP
80.0000 mg | Freq: Once | INTRAMUSCULAR | Status: AC
  Administered 2018-08-22: 80 mg via INTRAMUSCULAR

## 2018-08-22 MED ORDER — IPRATROPIUM-ALBUTEROL 0.5-2.5 (3) MG/3ML IN SOLN
3.0000 mL | Freq: Once | RESPIRATORY_TRACT | Status: AC
  Administered 2018-08-22: 3 mL via RESPIRATORY_TRACT

## 2018-08-22 NOTE — Progress Notes (Signed)
   Subjective:   Patient ID: Amy Williamson, female    DOB: 04-17-1951, 68 y.o.   MRN: [id number]  HPI The patient is a 68 YO female coming in for follow up of cough. Initially seen about 2-3 weeks ago and given IM steroid and doxycycline. The doxycycline made her GERD worse so was switched to omnicef. She finished that but was not feeling much better. Came back in and had CXR with possible pneumonia so given augmentin course. She is now feeling the same and possibly worse. She is wheezing a lot and coughing a lot. Still having SOB with much activity. Can walk from bedroom to kitchen and needs to stop to rest. Overall it is worsening slightly. Denies fevers or chills in the last 3 days. She is concerned about this. Going on almost a month now. Has nebulizer at home but no medication and no inhalers.   Review of Systems  Constitutional: Positive for activity change, appetite change and fatigue. Negative for chills, fever and unexpected weight change.  HENT: Positive for congestion. Negative for ear discharge, ear pain, postnasal drip, rhinorrhea, sinus pressure, sinus pain, sneezing, sore throat, tinnitus, trouble swallowing and voice change.   Eyes: Negative.   Respiratory: Positive for cough, shortness of breath and wheezing. Negative for chest tightness.   Cardiovascular: Negative.  Negative for chest pain, palpitations and leg swelling.  Gastrointestinal: Negative.  Negative for abdominal distention, abdominal pain, constipation, diarrhea, nausea and vomiting.  Musculoskeletal: Positive for myalgias.  Skin: Negative.   Neurological: Negative.   Psychiatric/Behavioral: Negative.     Objective:  Physical Exam Constitutional:      Appearance: She is well-developed. She is ill-appearing.  HENT:     Head: Normocephalic and atraumatic.     Comments: Oropharynx with redness and clear drainage, nose with swollen turbinates, TMs normal bilaterally.  Neck:     Musculoskeletal: Normal range of  motion.     Thyroid: No thyromegaly.  Cardiovascular:     Rate and Rhythm: Normal rate and regular rhythm.  Pulmonary:     Effort: Pulmonary effort is normal. No respiratory distress.     Breath sounds: Wheezing present. No rales.     Comments: Severe wheezing inspiratory and expiratory prior to nebulizer. Given duoneb and then lung exam with persistent expiratory wheezing but inspiratory resolved diffuse whole lungs Abdominal:     General: There is no distension.     Palpations: Abdomen is soft.     Tenderness: There is no abdominal tenderness.  Musculoskeletal:        General: Tenderness present.  Lymphadenopathy:     Cervical: No cervical adenopathy.  Skin:    General: Skin is warm and dry.  Neurological:     Mental Status: She is alert and oriented to person, place, and time.     Vitals:   08/22/18 0921 08/22/18 1133  BP: (!) 200/110 (!) 160/80  Pulse: 71   Temp: 98.6 F (37 C)   TempSrc: Oral   SpO2: 97%   Weight: 206 lb (93.4 kg)   Height: 5' (1.524 m)    Depo-medrol 80 mg IM given at visit  Assessment & Plan:  Visit time 25 minutes: greater than 50% of that time was spent in face to face counseling and coordination of care with the patient: counseled about breathing and options as well as wheezing and imaging results

## 2018-08-22 NOTE — Assessment & Plan Note (Addendum)
Given duoneb in the office due to wheezing and persistent wheezing after treatment. Offered ER visit for more nebulizer and they wish to try oral steroid treatment for 2 days. Given depo-medrol 80 mg IM today and rx for prednisone. She will call back Monday with status. If worsening this weekend she will seek care in ER. Given 2 recent courses of antibiotics will hold off on more and if no resolution with prednisone will do levaquin treatment. Rx for albuterol nebulizer solution.

## 2018-08-22 NOTE — Patient Instructions (Addendum)
We have given you a steroid shot today.   We have sent in prednisone to take 2 pills daily for 1 week.   Monday you need to check in with [place] on how you are feeling.   If feeling worse this weekend go to the ER.   We have sent in the medicine for the nebulizer machine that you can use every 4 hours as needed.   Come back for the chest x-ray at the office sometime the week of February 24th.

## 2018-08-25 ENCOUNTER — Ambulatory Visit: Payer: Self-pay

## 2018-08-25 NOTE — Telephone Encounter (Signed)
Pt. Calling to report she is feeling better. Still has "some wheezing, but it is much better." Continues with the Prednisone and nebulizer treatments. Instructed to call back if anything changes - verbalizes understanding.  Answer Assessment - Initial Assessment Questions 1. SYMPTOMS: "What symptoms are you most concerned about?" "Is it better, the same, or worse compared to when you saw the doctor?"     Feels better 2. BREATHING DIFFICULTY: "Are you having any difficulty breathing?" If so, ask "How bad is it?"  (e.g., none, mild, moderate, severe)   - MILD: No SOB at rest, mild SOB with walking, speaks normally in sentences, can lay down, no retractions, pulse < 100.    - MODERATE: SOB at rest, SOB with minimal exertion and prefers to sit, cannot lie down flat, speaks in phrases, mild retractions, audible wheezing, pulse 100-120.    - SEVERE: Very SOB at rest, speaks in single words, struggling to breathe, sitting hunched forward, retractions, pulse > 120      Mild 3. FEVER: "Do you have a fever?" If so, ask: "What is your temperature, how was it measured, and when did it start?"     No 4. SPUTUM: "Describe the color of your sputum" (clear, white, yellow, green, blood-tinged)     Not as yellow - getting lighter 5. DIAGNOSIS CONFIRMATION: "When was the pneumonia diagnosed?" "By whom?"     Friday 6. ANTIBIOTIC: "Are you taking an antibiotic?"  If so, "Which one?" "When was it started?"     On Prednisone and nebulizer 7. OTHER TREATMENT: "Are you receiving any other treatment for the pneumonia?" (e.g., albuterol nebulizer, oxygen) If so, ask, "How often?" and "Do they help?"     Helping - feels better 8. HOSPITAL ADMISSION: "Were you hospitalized for this pneumonia?" If so, ask "When were you discharged home from the hospital?"     No  Protocols used: PNEUMONIA ON ANTIBIOTIC FOLLOW-UP CALL-A-AH

## 2018-09-04 ENCOUNTER — Encounter: Payer: Self-pay | Admitting: Internal Medicine

## 2018-09-04 ENCOUNTER — Ambulatory Visit: Payer: Medicare Other | Admitting: Internal Medicine

## 2018-09-04 ENCOUNTER — Ambulatory Visit (INDEPENDENT_AMBULATORY_CARE_PROVIDER_SITE_OTHER)
Admission: RE | Admit: 2018-09-04 | Discharge: 2018-09-04 | Disposition: A | Discharge status: Home / Self Care | Payer: Medicare Other | Source: Ambulatory Visit | Attending: Internal Medicine | Admitting: Internal Medicine

## 2018-09-04 ENCOUNTER — Telehealth: Payer: Self-pay

## 2018-09-04 VITALS — BP 138/70 | HR 72 | Temp 98.6°F | Ht 60.0 in | Wt 205.0 lb

## 2018-09-04 DIAGNOSIS — R05 Cough: Secondary | ICD-10-CM | POA: Diagnosis not present

## 2018-09-04 DIAGNOSIS — R059 Cough, unspecified: Secondary | ICD-10-CM

## 2018-09-04 LAB — POCT EXHALED NITRIC OXIDE: FeNO level (ppb): 13

## 2018-09-04 NOTE — Telephone Encounter (Signed)
Appointment scheduled.

## 2018-09-04 NOTE — Telephone Encounter (Signed)
Copied from [place] #[id number]. Topic: General - Inquiry >> Sep 03, 2018  2:01 PM Conception Chancy, NT wrote: Reason for CRM: patient is calling and states that she has been seeing Dr. Sharlet Salina for her pneumonia and states that she still has a cough, ear/head pressure, and wheezing on and off as well. She states she is unsure if she needs to come back in. She states she is better than what she was but it is still there. Please advise.

## 2018-09-04 NOTE — Progress Notes (Signed)
   Subjective:   Patient ID: Amy Williamson, female    DOB: 1951/02/22, 68 y.o.   MRN: [id number]  HPI The patient is a 68 YO female coming in for follow up of severe respiratory illness. She did take a course of antibiotics and then a course of steroids as well as several steroid injections. She did finish prednisone course about 3-4 days ago. She is feeling better but not well. She is having a lot more head congestion. She denies fevers or chills. Not having wheezing anymore. She denies SOB at this time. She is still coughing and less productive although still some production. Denies sinus pressure or headaches.   Review of Systems  Constitutional: Positive for activity change, appetite change and fatigue. Negative for fever and unexpected weight change.  HENT: Positive for congestion, postnasal drip, rhinorrhea and sinus pressure. Negative for ear discharge, ear pain, sinus pain, sneezing, sore throat, tinnitus, trouble swallowing and voice change.   Eyes: Negative.   Respiratory: Positive for cough. Negative for chest tightness, shortness of breath and wheezing.   Cardiovascular: Negative.  Negative for chest pain, palpitations and leg swelling.  Gastrointestinal: Negative.  Negative for abdominal distention, abdominal pain, constipation, diarrhea, nausea and vomiting.  Musculoskeletal: Negative.   Skin: Negative.   Neurological: Negative.   Psychiatric/Behavioral: Negative.     Objective:  Physical Exam Constitutional:      Appearance: She is well-developed.  HENT:     Head: Normocephalic and atraumatic.     Comments: Oropharynx with redness and clear drainage, nose with swollen turbinates, TMs normal bilaterally.  Neck:     Musculoskeletal: Normal range of motion.     Thyroid: No thyromegaly.  Cardiovascular:     Rate and Rhythm: Normal rate and regular rhythm.  Pulmonary:     Effort: Pulmonary effort is normal. No respiratory distress.     Breath sounds: Normal breath sounds.  No wheezing or rales.     Comments: Lung exam with some persistent upper airway wheezing but lungs are clear and no wheezing Abdominal:     Palpations: Abdomen is soft.  Musculoskeletal:        General: No tenderness.  Lymphadenopathy:     Cervical: No cervical adenopathy.  Skin:    General: Skin is warm and dry.  Neurological:     Mental Status: She is alert and oriented to person, place, and time.    Vitals:   09/04/18 1449  BP: 138/70  Pulse: 72  Temp: 98.6 F (37 C)  TempSrc: Oral  SpO2: 98%  Weight: 205 lb (93 kg)  Height: 5' (1.524 m)   FENO 13  Assessment & Plan:

## 2018-09-04 NOTE — Telephone Encounter (Signed)
Needs visit

## 2018-09-04 NOTE — Telephone Encounter (Signed)
Can you please make patient an appointment. Thank you

## 2018-09-04 NOTE — Patient Instructions (Signed)
We will do the chest x-ray today and call you back about the results.   We have done a test today for the breathing and you do not need more prednisone.   Start taking zyrtec (cetirizine) daily to help with the head congestion.

## 2018-09-05 NOTE — Assessment & Plan Note (Signed)
She is improving overall and lung exam is dramatically improved after the prednisone and FENO 13 today done in office ruling out continued inflammation. She will get CXR today to check for clearance of pneumonia. Advised to start the nasacort she has at home and start zyrtec as well for the sinus drainage.

## 2018-09-08 MED ORDER — PREDNISONE 20 MG PO TABS: 40.0000 mg | ORAL_TABLET | Freq: Every day | ORAL | 0 refills | Status: DC

## 2018-09-08 NOTE — Addendum Note (Signed)
Addended by: Pricilla Holm A on: 09/08/2018 09:05 AM   Modules accepted: Orders

## 2018-09-08 NOTE — Telephone Encounter (Signed)
Patient informed of MD response states that she is using the nasocort, zyrtec, and sometimes mucinex.

## 2018-09-08 NOTE — Telephone Encounter (Signed)
Have sent in another course of prednisone to see if we can improve symptoms but cough could stay for several more weeks and she should not expect this to be gone with the prednisone. Has she started taking the nasacort from home?

## 2018-09-08 NOTE — Telephone Encounter (Signed)
Called patient states symptoms are staying the same

## 2018-09-08 NOTE — Telephone Encounter (Signed)
This note does not talk about change from visit. Are her symptoms improving, worsening, or stable?

## 2018-09-08 NOTE — Telephone Encounter (Signed)
Relation to pt: self  Call back number: [phone] home # [phone] mobile  Pharmacy: [id number] - [place], [place] [phone] (Phone) [phone] (Fax)     Reason for call:  Patient states PCP wanted an update regarding her symptoms after her DG Chest 2 View taken on Thursday. As per patient results still show pneumonia. Patient experiencing coughing, wheezing and slight sinus pressure, requesting Rx, please advise

## 2018-09-19 ENCOUNTER — Ambulatory Visit: Payer: Medicare Other | Admitting: Internal Medicine

## 2018-09-19 ENCOUNTER — Encounter: Payer: Self-pay | Admitting: Internal Medicine

## 2018-09-19 ENCOUNTER — Ambulatory Visit (INDEPENDENT_AMBULATORY_CARE_PROVIDER_SITE_OTHER)
Admission: RE | Admit: 2018-09-19 | Discharge: 2018-09-19 | Disposition: A | Discharge status: Home / Self Care | Payer: Medicare Other | Source: Ambulatory Visit | Attending: Internal Medicine | Admitting: Internal Medicine

## 2018-09-19 ENCOUNTER — Other Ambulatory Visit (INDEPENDENT_AMBULATORY_CARE_PROVIDER_SITE_OTHER): Payer: Medicare Other

## 2018-09-19 VITALS — BP 138/80 | HR 76 | Temp 99.2°F | Ht 60.0 in | Wt 202.0 lb

## 2018-09-19 DIAGNOSIS — G4700 Insomnia, unspecified: Secondary | ICD-10-CM | POA: Insufficient documentation

## 2018-09-19 DIAGNOSIS — R05 Cough: Secondary | ICD-10-CM

## 2018-09-19 DIAGNOSIS — G4701 Insomnia due to medical condition: Secondary | ICD-10-CM

## 2018-09-19 DIAGNOSIS — R059 Cough, unspecified: Secondary | ICD-10-CM

## 2018-09-19 DIAGNOSIS — R0602 Shortness of breath: Secondary | ICD-10-CM | POA: Insufficient documentation

## 2018-09-19 LAB — COMPREHENSIVE METABOLIC PANEL
ALBUMIN: 4.3 g/dL (ref 3.5–5.2)
ALT: 20 U/L (ref 0–35)
AST: 14 U/L (ref 0–37)
Alkaline Phosphatase: 78 U/L (ref 39–117)
BUN: 21 mg/dL (ref 6–23)
CO2: 28 mEq/L (ref 19–32)
Calcium: 9.9 mg/dL (ref 8.4–10.5)
Chloride: 104 mEq/L (ref 96–112)
Creatinine, Ser: 0.78 mg/dL (ref 0.40–1.20)
GFR: 73.25 mL/min (ref >60.00)
Glucose, Bld: 95 mg/dL (ref 70–99)
POTASSIUM: 4.7 meq/L (ref 3.5–5.1)
SODIUM: 142 meq/L (ref 135–145)
Total Bilirubin: 0.6 mg/dL (ref 0.2–1.2)
Total Protein: 6.9 g/dL (ref 6.0–8.3)

## 2018-09-19 LAB — TROPONIN I: TNIDX: 0.01 ug/l (ref 0.00–0.06)

## 2018-09-19 LAB — CBC
HCT: 41.6 % (ref 36.0–46.0)
Hemoglobin: 14.1 g/dL (ref 12.0–15.0)
MCHC: 34 g/dL (ref 30.0–36.0)
MCV: 83.4 fl (ref 78.0–100.0)
Platelets: 224 10*3/uL (ref 150.0–400.0)
RBC: 4.99 Mil/uL (ref 3.87–5.11)
RDW: 15.3 % (ref 11.5–15.5)
WBC: 9.7 10*3/uL (ref 4.0–10.5)

## 2018-09-19 LAB — D-DIMER, QUANTITATIVE: D-Dimer, Quant: 0.38 mcg/mL FEU (ref <0.50)

## 2018-09-19 LAB — BRAIN NATRIURETIC PEPTIDE: PRO B NATRI PEPTIDE: 52 pg/mL (ref 0.0–100.0)

## 2018-09-19 MED ORDER — TRAZODONE HCL 50 MG PO TABS: 50.0000 mg | ORAL_TABLET | Freq: Every evening | ORAL | 3 refills | Status: DC | PRN

## 2018-09-19 NOTE — Assessment & Plan Note (Signed)
Concerning that she is still having significant SOB and previously low FENO and CXR was partially cleared. Checking CXR today to confirm no new lung findings. EKG done today with some new background noise which is likely not significant. Checking BNP, troponin, d-dimer, CBC, CMP to rule out alternate cause. Most likely is deconditioning.

## 2018-09-19 NOTE — Assessment & Plan Note (Signed)
Recheck CXR today as resolving pneumonia last time and she is still having SOB/fatigue. She has no wheezing on exam and FENO low last visit so did not repeat today. EKG done today with some baseline background noise but no significant changes from prior, sinus without ST or t wave changes. Checking D-dimer, BNP, CBC, CMP, troponin to rule out alternate etiology. Most likely explanation is deconditioning but need to rule out medical cause.

## 2018-09-19 NOTE — Progress Notes (Signed)
Subjective:   Patient ID: Amy Williamson, female    DOB: Jan 29, 1951, 68 y.o.   MRN: [id number]  HPI The patient is a 68 y.o. female coming in for continued respiratory symptoms. Started about 2 weeks ago with worsening. She did stop taking zyrtec and nasacort about 1 week ago. She is very fatigued which has worsened about 2 weeks ago. Cough is improving and rare now with rare sputum production. She is SOB due to fatigue and also breathing. She is trying to move around when she can. Having some headaches recently and sinus pressure. Some more ear tinnitus in the right and in the left. Some normally in the right but louder now. None usually in the left. Denies fevers or chills. Overall it is worsening. Has tried nothing recently. Stopped zyrtec and nasacort about 1 week ago. Took steroids several times and antibiotics about 1 month ago. Also she is not sleeping well for the last few nights and maybe only a couple of hours here and there.   PMH, [facility], social history reviewed and updated  Review of Systems  Constitutional: Positive for activity change, appetite change and fatigue. Negative for chills, diaphoresis, fever and unexpected weight change.  HENT: Positive for congestion, ear pain, postnasal drip, rhinorrhea, sinus pressure and tinnitus. Negative for sinus pain, sneezing, sore throat, trouble swallowing and voice change.   Eyes: Negative.   Respiratory: Positive for cough and shortness of breath. Negative for chest tightness and wheezing.   Cardiovascular: Negative for chest pain, palpitations and leg swelling.  Gastrointestinal: Negative for abdominal distention, abdominal pain, constipation, diarrhea, nausea and vomiting.  Musculoskeletal: Negative.   Skin: Negative.   Neurological: Negative.   Psychiatric/Behavioral: Negative.     Objective:  Physical Exam Constitutional:      Appearance: She is well-developed. She is obese.  HENT:     Head: Normocephalic and atraumatic.   Comments: Oropharynx with redness and clear drainage, eyes with slight bulging TM clear fluid, nose with swelling no crusting, no sinus pain Neck:     Musculoskeletal: Normal range of motion.  Cardiovascular:     Rate and Rhythm: Normal rate and regular rhythm.  Pulmonary:     Effort: Pulmonary effort is normal. No respiratory distress.     Breath sounds: Normal breath sounds. No wheezing or rales.     Comments: Lungs clear and improved from prior. No wheezing or rales noted.  Abdominal:     General: Bowel sounds are normal. There is no distension.     Palpations: Abdomen is soft.     Tenderness: There is no abdominal tenderness. There is no rebound.  Skin:    General: Skin is warm and dry.  Neurological:     Mental Status: She is alert and oriented to person, place, and time.     Coordination: Coordination normal.    Vitals:   09/19/18 1257 09/19/18 1333  BP: (!) 190/100 138/80  Pulse: 76   Temp: 99.2 F (37.3 C)   TempSrc: Oral   SpO2: 98%   Weight: 202 lb (91.6 kg)   Height: 5' (1.524 m)    EKG: Rate 79, axis normal, interval normal, no st or t wave changes, sinus, some increased background noise but does not appear to be a fib or a flutter, compared to prior 2018 there is the increased background noise which is new, otherwise no significant changes  Assessment & Plan:  Visit time 40 minutes: greater than 50% of that time was spent in  face to face counseling and coordination of care with the patient: counseled about symptoms, etiology, course, EKG interpretation, other symptom management, sleep

## 2018-09-19 NOTE — Patient Instructions (Signed)
We are checking the x-ray today and the labs.   We have sent in the medicine to help with sleep called trazodone which you can take as needed.   We have done the EKG today which looks some different than last time but no signs of heart attack or a fib or other rhythm problem.

## 2018-09-19 NOTE — Assessment & Plan Note (Signed)
Rx for trazodone to help. Suspect the change in sleep is due to stress. Checking BNP to rule out acute heart failure causing SOB causing lack of sleep although she did not complain of this.

## 2018-09-28 ENCOUNTER — Other Ambulatory Visit: Payer: Self-pay

## 2018-09-28 ENCOUNTER — Encounter (HOSPITAL_COMMUNITY): Payer: Self-pay | Admitting: Emergency Medicine

## 2018-09-28 ENCOUNTER — Emergency Department (HOSPITAL_COMMUNITY)
Admission: EM | Admit: 2018-09-28 | Discharge: 2018-09-28 | Disposition: A | Discharge status: Home / Self Care | Payer: Medicare Other | Attending: Emergency Medicine | Admitting: Emergency Medicine

## 2018-09-28 DIAGNOSIS — R059 Cough, unspecified: Secondary | ICD-10-CM

## 2018-09-28 DIAGNOSIS — Z79899 Other long term (current) drug therapy: Secondary | ICD-10-CM | POA: Diagnosis not present

## 2018-09-28 DIAGNOSIS — R05 Cough: Secondary | ICD-10-CM | POA: Diagnosis not present

## 2018-09-28 DIAGNOSIS — I10 Essential (primary) hypertension: Secondary | ICD-10-CM | POA: Insufficient documentation

## 2018-09-28 NOTE — ED Provider Notes (Signed)
[facility] DEPT Provider Note   CSN: [id number] Arrival date & time: 09/28/18  7846    History   Chief Complaint Chief Complaint  Patient presents with  . Cough  . ear feel clogged  . Headache   Patient gives permission to perform history and physical in front of family/friend   HPI Amy Williamson is a 68 y.o. female.     The history is provided by the patient and the spouse.  Cough  Cough characteristics:  Productive Sputum characteristics: Mucous, no hemoptysis. Severity:  Moderate Onset quality:  Gradual Timing:  Intermittent Progression:  Unchanged Chronicity:  Recurrent Smoker: no   Relieved by:  Home nebulizer Worsened by:  Activity Associated symptoms: chills, ear fullness, headaches, shortness of breath and sinus congestion   Associated symptoms: no chest pain and no fever   Risk factors: no recent travel   Headache  Associated symptoms: cough and fatigue   Associated symptoms: no diarrhea, no fever and no vomiting   Patient presents for ongoing cough symptoms. Reports she has been sick since January.  She has had at least 2 rounds of antibiotics and 2 courses of steroids.  She reports history of pneumonia and has had multiple x-rays to monitor this.  She reports she was seen at the end of February by her PCP and had an extensive evaluation.  She reports the cough appears to be worse in the morning.  She reports some fatigue and generalized weakness.  No active chest pain.  No hemoptysis. She Is a non-smoker.  No travel.  She reports they had their home evaluated last night for carbon monoxide.  The testing revealed small levels of CO in her house.  They have carbon oxide detectors that were not detecting this.  The gas system was shut off last night, and the CO levels are now negative.  She is concerned that all of her symptoms were coming from the CO Past Medical History:  Diagnosis Date  . Arthritis    BACK AND SHOULDERS  .  Bronchitis 07-14-11  . Cataract immature BILATERAL  . Depressive disorder, not elsewhere classified   . Dysphagia   . History of esophageal reflux   . History of migraine headaches   . History of pneumonia 05-14-11  . Obesity, unspecified   . Palpitations   . Plantar fasciitis 08/2017   left foot   . Pure hypercholesterolemia   . Unspecified essential hypertension   . Unspecified venous (peripheral) insufficiency     Patient Active Problem List   Diagnosis Date Noted  . SOB (shortness of breath) 09/19/2018  . Insomnia 09/19/2018  . Cough 04/02/2018  . Routine general medical examination at a health care facility 04/02/2016  . Other dysphagia 09/21/2013  . Palpitations 03/24/2013  . Morbid obesity ([place]) 03/24/2013  . DJD (degenerative joint disease) 03/24/2013  . GERD (gastroesophageal reflux disease) 02/27/2012  . Hyperlipidemia 01/21/2008  . Essential hypertension 01/21/2008    Past Surgical History:  Procedure Laterality Date  . CARPAL TUNNEL RELEASE Right 2004  . CARPAL TUNNEL RELEASE Left 2006  . CESAREAN SECTION  9629,5284  . EAR CYST EXCISION  08/03/2011   patient states this is incorrect  . KNEE ARTHROSCOPY    . KNEE ARTHROSCOPY W/ MENISCAL REPAIR  08/03/2011   with popliteal cyst excition  . KNEE ARTHROSCOPY WITH EXCISION BAKER'S CYST    . PULLEY RELEASE RIGHT TRIGGER FINGER Right 01/08/2011  . TRIGGER FINGER RELEASE Right 04/20/13  . TRIGGER  FINGER RELEASE Left 05/06/14  . TUBAL LIGATION  1977   with second cesarean section     OB History    Gravida  2   Para  2   Term      Preterm      AB      Living  2     SAB      TAB      Ectopic      Multiple      Live Births               Home Medications    Prior to Admission medications   Medication Sig Start Date End Date Taking? Authorizing Provider  albuterol (PROVENTIL) (2.5 MG/3ML) 0.083% nebulizer solution Take 3 mLs (2.5 mg total) by nebulization every 4 (four) hours as needed for  wheezing or shortness of breath. Patient not taking: Reported on 09/04/2018 08/22/18   Hoyt Koch, MD  amLODipine (NORVASC) 5 MG tablet TAKE 1 TABLET BY MOUTH EVERY DAY 04/28/18   Hoyt Koch, MD  Ibuprofen 200 MG CAPS Take by mouth as needed.    [provider]  omeprazole (PRILOSEC) 20 MG capsule Take 1 capsule (20 mg total) by mouth daily. 08/04/18   Hoyt Koch, MD  traZODone (DESYREL) 50 MG tablet Take 1 tablet (50 mg total) by mouth at bedtime as needed for sleep. 09/19/18   Hoyt Koch, MD    Family History Family History  Problem Relation Age of Onset  . Heart failure Mother   . Osteoporosis Mother   . Hyperlipidemia Mother   . Hypertension Mother   . Heart disease Father   . Clotting disorder Father   . Liver cancer Brother   . Liver disease Maternal Aunt        x2  . Colon cancer Neg Hx     Social History Social History   Tobacco Use  . Smoking status: Never Smoker  . Smokeless tobacco: Never Used  Substance Use Topics  . Alcohol use: No  . Drug use: No     Allergies   Atenolol; Avelox [moxifloxacin hcl in nacl]; Azithromycin; and Doxycycline   Review of Systems Review of Systems  Constitutional: Positive for chills and fatigue. Negative for fever.  HENT: Positive for tinnitus.   Respiratory: Positive for cough and shortness of breath.   Cardiovascular: Negative for chest pain.  Gastrointestinal: Negative for diarrhea and vomiting.  Neurological: Positive for headaches.  All other systems reviewed and are negative.    Physical Exam Updated Vital Signs BP 128/66   Pulse 80   Temp 98.5 F (36.9 C) (Oral)   Resp 18   LMP 07/23/2004   SpO2 98%   Physical Exam CONSTITUTIONAL: Well developed/well nourished, no distress HEAD: Normocephalic/atraumatic EYES: EOMI/PERRL ENMT: Mucous membranes moist, bilateral TMs clear and intact, cerumen noted right ear canal Uvula midline without erythema or  exudates NECK: supple no meningeal signs SPINE/BACK:entire spine nontender CV: S1/S2 noted, no murmurs/rubs/gallops noted LUNGS: Lungs are clear to auscultation bilaterally, no apparent distress ABDOMEN: soft, nontender, no rebound or guarding, bowel sounds noted throughout abdomen GU:no cva tenderness NEURO: Pt is awake/alert/appropriate, moves all extremitiesx4.  No facial droop.   EXTREMITIES: pulses normal/equal, full ROM, no lower extremity edema SKIN: warm, color normal PSYCH: no abnormalities of mood noted, alert and oriented to situation   ED Treatments / Results  Labs (all labs ordered are listed, but only abnormal results are displayed) Labs Reviewed -  No data to display  EKG None  Radiology No results found.  Procedures Procedures   Medications Ordered in ED Medications - No data to display   Initial Impression / Assessment and Plan / ED Course  I have reviewed the triage vital signs and the nursing notes.         Patient reports feeling sick for over 2 months.  She has seen her primary doctor multiple times and has been on several rounds of antibiotics. Reviewing epic reveals she had an x-ray February 28 that showed improved infiltrate.  She had extensive labs done on that same day that ruled out PE/CHF/ACS Reviewed the chest x-ray from that day.  I reviewed EKG from that day that showed artifact but no gross ST changes Today, her vital signs are appropriate, she is afebrile, no hypoxia.  She is in no respiratory distress.  Her lung sounds are clear.  I offered her further testing as well as nebulized therapies because she reports improvement from that, but she declines.  She wanted reassurance after this possible CO exposure.  Since exposure ended over 12 hours ago, it is unlikely that level would be elevated at this time. She feels improved and would like to be discharged Discussed strict ER return precautions  Final Clinical Impressions(s) / ED Diagnoses    Final diagnoses:  Cough    ED Discharge Orders    None       Ripley Fraise, MD 09/28/18 1019

## 2018-09-28 NOTE — ED Notes (Signed)
Hx of pneumonia, finished antibiotics and steroids approx 2 weeks ago. Pt complaints of generalized weakness, SOB, productive cough with yellow sputum, and ringing in the ears. Pt states she had carbon monoxide levels tested in home yesterday and showed increased levels within the home. Actual reading unknown.

## 2018-09-28 NOTE — Discharge Instructions (Addendum)
°  SEEK IMMEDIATE MEDICAL ATTENTION IF: You have severe chest pain, especially if the pain is crushing or pressure-like and spreads to the arms, back, neck, or jaw, or if you have sweating, nausea (feeling sick to your stomach), or shortness of breath. THIS IS AN EMERGENCY. Don't wait to see if the pain will go away. Get medical help at once. Call 911 or 0 (operator). DO NOT drive yourself to the hospital.    You have an attack of chest pain lasting longer than usual, despite rest and treatment with the medications your caregiver has prescribed.  You wake from sleep with chest pain or shortness of breath.  You feel dizzy or faint.  You have chest pain not typical of your usual pain for which you originally saw your caregiver.

## 2018-09-28 NOTE — ED Triage Notes (Signed)
Pt states that been sick all winter and saw PCP in Jan for URI. Then went back in Feb and had PNA. Reports that had her gas logs tested last night and putting off carbon monoxide and thinks that's what causing her head pressure, constant dry cough, ears feeling clogged. Reports when walks around HR gets high and feels SOB.

## 2018-10-11 ENCOUNTER — Other Ambulatory Visit: Payer: Self-pay | Admitting: Internal Medicine

## 2018-10-29 ENCOUNTER — Other Ambulatory Visit: Payer: Self-pay | Admitting: Internal Medicine

## 2019-02-02 ENCOUNTER — Other Ambulatory Visit: Payer: Self-pay | Admitting: Obstetrics & Gynecology

## 2019-02-02 DIAGNOSIS — Z1231 Encounter for screening mammogram for malignant neoplasm of breast: Secondary | ICD-10-CM

## 2019-02-25 ENCOUNTER — Other Ambulatory Visit: Payer: Self-pay

## 2019-02-27 ENCOUNTER — Encounter: Payer: Self-pay | Admitting: Obstetrics & Gynecology

## 2019-02-27 ENCOUNTER — Other Ambulatory Visit: Payer: Self-pay

## 2019-02-27 ENCOUNTER — Ambulatory Visit (INDEPENDENT_AMBULATORY_CARE_PROVIDER_SITE_OTHER): Payer: Medicare Other | Admitting: Obstetrics & Gynecology

## 2019-02-27 VITALS — BP 142/80 | HR 88 | Temp 98.1°F | Ht 60.0 in | Wt 215.8 lb

## 2019-02-27 DIAGNOSIS — Z01419 Encounter for gynecological examination (general) (routine) without abnormal findings: Secondary | ICD-10-CM

## 2019-02-27 DIAGNOSIS — N760 Acute vaginitis: Secondary | ICD-10-CM

## 2019-02-27 NOTE — Patient Instructions (Signed)
Vulvar Biopsy Post-procedure Instructions . You may take Ibuprofen, Aleve, or Tylenol for any pain or discomfort. . You may have a small amount of spotting.  You should wear a mini pad for the next few days. . You may use some topical Neosporin ointment (over the counter) if you would like after spotting/drainage stops . You will need to call the office if you have worsening redness or tenderness in the area where the abscess was located. . Shower or bathe as normal.  Just use soap and water and keep dry.

## 2019-02-27 NOTE — Progress Notes (Signed)
68 y.o. G2P2 Married White or Caucasian female here for annual exam.  Reports she had URI in January that lasted into March.  Ended up with pneumonia.    Finally got her husband's house sold.  This has been good for them to have this done.  Husband had total knee of the left.  He has done really well.  Denies vaginal bleeding.  Pt has noticed an enlarging and tender area on her left labia that she would like me to evaluate today.  Patient's last menstrual period was 07/23/2004.          Sexually active: No.  The current method of family planning is post menopausal status.    Exercising: Yes.     Smoker:  no  Health Maintenance: Pap:  02/20/18 neg   08/04/15 ASCUS. HR HPV:neg  History of abnormal Pap:  yes MMG:  12/12/17 BIRADS1:Neg. Has appt 03/19/19  Colonoscopy:  03/12/12 f/u 10 years  BMD:   12/08/15 Normal  TDaP:  08/04/15 Pneumonia vaccine(s):  2018 Shingrix:   Completed  Hep C testing: 08/04/15 neg  Screening Labs: PCP   reports that she has never smoked. She has never used smokeless tobacco. She reports that she does not drink alcohol or use drugs.  Past Medical History:  Diagnosis Date  . Arthritis    BACK AND SHOULDERS  . Bronchitis 07-14-11  . Cataract immature BILATERAL  . Depressive disorder, not elsewhere classified   . Dysphagia   . History of esophageal reflux   . History of migraine headaches   . History of pneumonia 05-14-11  . Obesity, unspecified   . Palpitations   . Plantar fasciitis 08/2017   left foot   . Pure hypercholesterolemia   . Unspecified essential hypertension   . Unspecified venous (peripheral) insufficiency     Past Surgical History:  Procedure Laterality Date  . CARPAL TUNNEL RELEASE Right 2004  . CARPAL TUNNEL RELEASE Left 2006  . CESAREAN SECTION  5093,2671  . EAR CYST EXCISION  08/03/2011   patient states this is incorrect  . KNEE ARTHROSCOPY    . KNEE ARTHROSCOPY W/ MENISCAL REPAIR  08/03/2011   with popliteal cyst excition  . KNEE  ARTHROSCOPY WITH EXCISION BAKER'S CYST    . PULLEY RELEASE RIGHT TRIGGER FINGER Right 01/08/2011  . TRIGGER FINGER RELEASE Right 04/20/13  . TRIGGER FINGER RELEASE Left 05/06/14  . TUBAL LIGATION  1977   with second cesarean section    Current Outpatient Medications  Medication Sig Dispense Refill  . amLODipine (NORVASC) 5 MG tablet TAKE 1 TABLET BY MOUTH EVERY DAY 90 tablet 1  . diphenhydramine-acetaminophen (TYLENOL PM) 25-500 MG TABS tablet Take 1 tablet by mouth at bedtime as needed.    . Ibuprofen 200 MG CAPS Take by mouth as needed.    [name] omeprazole (PRILOSEC) 20 MG capsule Take 1 capsule (20 mg total) by mouth daily. 90 capsule 3   No current facility-administered medications for this visit.     Family History  Problem Relation Age of Onset  . Heart failure Mother   . Osteoporosis Mother   . Hyperlipidemia Mother   . Hypertension Mother   . Heart disease Father   . Clotting disorder Father   . Liver cancer Brother   . Liver disease Maternal Aunt        x2  . Colon cancer Neg Hx     Review of Systems  Genitourinary:       Vulvar bump  All other systems reviewed and are negative.   Exam:   BP (!) 142/80   Pulse 88   Temp 98.1 F (36.7 C) (Temporal)   Ht 5' (1.524 m)   Wt 215 lb 12.8 oz (97.9 kg)   LMP 07/23/2004   BMI 42.15 kg/m    Height: 5' (152.4 cm)  Ht Readings from Last 3 Encounters:  02/27/19 5' (1.524 m)  09/19/18 5' (1.524 m)  09/04/18 5' (1.524 m)    General appearance: alert, cooperative and appears stated age Head: Normocephalic, without obvious abnormality, atraumatic Neck: no adenopathy, supple, symmetrical, trachea midline and thyroid normal to inspection and palpation Lungs: clear to auscultation bilaterally Breasts: normal appearance, no masses or tenderness Heart: regular rate and rhythm Abdomen: soft, non-tender; bowel sounds normal; no masses,  no organomegaly Extremities: extremities normal, atraumatic, no cyanosis or edema Skin:  Skin color, texture, turgor normal. No rashes or lesions Lymph nodes: Cervical, supraclavicular, and axillary nodes normal. No abnormal inguinal nodes palpated Neurologic: Grossly normal  Pelvic: External genitalia:  No visible lesion except for dime size labial abscess on left labia majora, mid portion of labia, fluctuance noted              Urethra:  normal appearing urethra with no masses, tenderness or lesions              Bartholins and Skenes: normal                 Vagina: normal appearing vagina with normal color and discharge, no lesions              Cervix: no lesions              Pap taken: No. Bimanual Exam:  Uterus:  normal size, contour, position, consistency, mobility, non-tender              Adnexa: normal adnexa and no mass, fullness, tenderness               Rectovaginal: Confirms               Anus:  normal sphincter tone, no lesions  I&D recommended.  Verbal consent obtained.  Area cleansed with Hibiclens x 3.  1.0cc 1% Lidocaine instilled.  Lot: [id number].  Exp:  01/21/20.  Area opened with #11 blade.  Purulent drainage and an inclusion cyst came from area once it was opened.  Culture obtained.  Silver nitrate used for hemostasis.  Pt tolerated procedure well.  Chaperone was present for exam.  A:  Well Woman with normal exam  PMP, no HRT Hypertension Left labia majora abscess  P:   Mammogram guidelines reviewed.  Has appt scheduled. pap smear neg 2019.  Not indicated today. Colonoscopy UTD BMD UTD Vaccines UTD Lab work done with Dr. Sharlet Salina (routine done 04/2018) I&D of labial abscess performed.  Culture pending.  Pt aware to call if any signs/symptoms of worsening infection. Return annually or prn

## 2019-03-01 MED ORDER — SULFAMETHOXAZOLE-TRIMETHOPRIM 800-160 MG PO TABS: 1.0000 | ORAL_TABLET | Freq: Two times a day (BID) | ORAL | 0 refills | Status: DC

## 2019-03-01 MED ORDER — FLUCONAZOLE 150 MG PO TABS: ORAL_TABLET | ORAL | 0 refills | Status: DC

## 2019-03-01 NOTE — Addendum Note (Signed)
Addended by: Megan Salon on: 03/01/2019 10:55 AM   Modules accepted: Orders

## 2019-03-02 LAB — WOUND CULTURE: Organism ID, Bacteria: NONE SEEN

## 2019-03-06 ENCOUNTER — Other Ambulatory Visit: Payer: Self-pay

## 2019-03-06 ENCOUNTER — Other Ambulatory Visit: Payer: Self-pay | Admitting: *Deleted

## 2019-03-06 ENCOUNTER — Encounter: Payer: Self-pay | Admitting: Internal Medicine

## 2019-03-06 ENCOUNTER — Telehealth: Payer: Self-pay | Admitting: Obstetrics & Gynecology

## 2019-03-06 ENCOUNTER — Ambulatory Visit (INDEPENDENT_AMBULATORY_CARE_PROVIDER_SITE_OTHER): Payer: Medicare Other | Admitting: Internal Medicine

## 2019-03-06 DIAGNOSIS — R110 Nausea: Secondary | ICD-10-CM | POA: Diagnosis not present

## 2019-03-06 DIAGNOSIS — J329 Chronic sinusitis, unspecified: Secondary | ICD-10-CM | POA: Insufficient documentation

## 2019-03-06 DIAGNOSIS — Z20828 Contact with and (suspected) exposure to other viral communicable diseases: Secondary | ICD-10-CM | POA: Insufficient documentation

## 2019-03-06 DIAGNOSIS — J019 Acute sinusitis, unspecified: Secondary | ICD-10-CM

## 2019-03-06 DIAGNOSIS — Z20822 Contact with and (suspected) exposure to covid-19: Secondary | ICD-10-CM

## 2019-03-06 MED ORDER — CEFDINIR 300 MG PO CAPS: 300.0000 mg | ORAL_CAPSULE | Freq: Two times a day (BID) | ORAL | 0 refills | Status: DC

## 2019-03-06 MED ORDER — ONDANSETRON HCL 4 MG PO TABS: 4.0000 mg | ORAL_TABLET | Freq: Three times a day (TID) | ORAL | 0 refills | Status: DC | PRN

## 2019-03-06 NOTE — Telephone Encounter (Signed)
Patient stated that she had a vulvar biopsy on 02/27/2019. She has been running a low grade fever (99.5-99.6) since 02/28/2019. Around 3AM this morning she was woken up with a 102.9 fever, again at 6AM with a 100.7 fever. Patient just took temperature and it was 101.7.

## 2019-03-06 NOTE — Telephone Encounter (Signed)
Since she isn't having pain at the I&D site, she should f/u with her primary.

## 2019-03-06 NOTE — Patient Instructions (Signed)
Please take all new medication as prescribed - the omnicef antibiotic, and zofran as needed for nausea  Please also go to the [facility] for COVID testing  Please continue all other medications as before, and refills have been done if requested.  Please have the pharmacy call with any other refills you may need.  Please keep your appointments with your specialists as you may have planned

## 2019-03-06 NOTE — Assessment & Plan Note (Signed)
Also for zofran prn,  to f/u any worsening symptoms or concerns

## 2019-03-06 NOTE — Assessment & Plan Note (Signed)
Also for covid testing - will place order, pt states will go the [facility] site, and self isolate

## 2019-03-06 NOTE — Assessment & Plan Note (Signed)
Mild to mod, for antibx course,  to f/u any worsening symptoms or concerns 

## 2019-03-06 NOTE — Progress Notes (Signed)
Patient ID: Amy Williamson, female   DOB: February 12, 1951, 68 y.o.   MRN: [id number]  Virtual Visit via Video Note  I connected with [name] on 03/06/19 at  1:40 PM EDT by a video enabled telemedicine application and verified that I am speaking with the correct person using two identifiers.  Location: Patient: at home Provider: at office   I discussed the limitations of evaluation and management by telemedicine and the availability of in person appointments. The patient expressed understanding and agreed to proceed.  History of Present Illness:  Here with 6 days acute onset fever, facial pain, pressure, headache, general weakness and malaise, with mild ST and cough, but pt denies chest pain, wheezing, increased sob or doe, orthopnea, PND, increased LE swelling, palpitations, dizziness or syncope.  Has just finished septra DS x 1 wk for infected vaginal labial cyst, but today awoke with temp up to 102.9, improved somewhat with ibuprofen but now going up again, as well as chills, aching,  Also with some nausea, but no vomiting and Denies worsening reflux, abd pain, dysphagia, bowel change or blood. Past Medical History:  Diagnosis Date  . Arthritis    BACK AND SHOULDERS  . Bronchitis 07-14-11  . Cataract immature BILATERAL  . Depressive disorder, not elsewhere classified   . Dysphagia   . History of esophageal reflux   . History of migraine headaches   . History of pneumonia 05-14-11  . Obesity, unspecified   . Palpitations   . Plantar fasciitis 08/2017   left foot   . Pure hypercholesterolemia   . Unspecified essential hypertension   . Unspecified venous (peripheral) insufficiency    Past Surgical History:  Procedure Laterality Date  . CARPAL TUNNEL RELEASE Right 2004  . CARPAL TUNNEL RELEASE Left 2006  . CESAREAN SECTION  [id number]  . EAR CYST EXCISION  08/03/2011   patient states this is incorrect  . KNEE ARTHROSCOPY    . KNEE ARTHROSCOPY W/ MENISCAL REPAIR  08/03/2011   with popliteal cyst excition  . KNEE ARTHROSCOPY WITH EXCISION BAKER'S CYST    . PULLEY RELEASE RIGHT TRIGGER FINGER Right 01/08/2011  . TRIGGER FINGER RELEASE Right 04/20/13  . TRIGGER FINGER RELEASE Left 05/06/14  . TUBAL LIGATION  1977   with second cesarean section    reports that she has never smoked. She has never used smokeless tobacco. She reports that she does not drink alcohol or use drugs. family history includes Clotting disorder in her father; Heart disease in her father; Heart failure in her mother; Hyperlipidemia in her mother; Hypertension in her mother; Liver cancer in her brother; Liver disease in her maternal aunt; Osteoporosis in her mother. Allergies  Allergen Reactions  . Atenolol     Other reaction(s): Decreased blood pressure  . Avelox [Moxifloxacin Hcl In Nacl]     Pt did not like side effects  . Azithromycin     ?causes muscle weakness, dysphagia  . Doxycycline Nausea Only   Current Outpatient Medications on File Prior to Visit  Medication Sig Dispense Refill  . amLODipine (NORVASC) 5 MG tablet TAKE 1 TABLET BY MOUTH EVERY DAY 90 tablet 1  . diphenhydramine-acetaminophen (TYLENOL PM) 25-500 MG TABS tablet Take 1 tablet by mouth at bedtime as needed.    . fluconazole (DIFLUCAN) 150 MG tablet 1 tab po x 1, repeat in 72 hours if needed. 2 tablet 0  . Ibuprofen 200 MG CAPS Take by mouth as needed.    [name] omeprazole (PRILOSEC) 20 MG  capsule Take 1 capsule (20 mg total) by mouth daily. 90 capsule 3  . sulfamethoxazole-trimethoprim (BACTRIM DS) 800-160 MG tablet Take 1 tablet by mouth 2 (two) times daily. 14 tablet 0   No current facility-administered medications on file prior to visit.     Observations/Objective: Alert, NAD, mild ill appearing, appropriate mood and affect, resps normal, cn 2-12 intact, moves all 4s, no visible rash or swelling Lab Results  Component Value Date   WBC 9.7 09/19/2018   HGB 14.1 09/19/2018   HCT 41.6 09/19/2018   PLT 224.0 09/19/2018    GLUCOSE 95 09/19/2018   CHOL 221 (H) 05/09/2018   TRIG 91.0 05/09/2018   HDL 64.30 05/09/2018   LDLDIRECT 142.3 03/16/2013   LDLCALC 139 (H) 05/09/2018   ALT 20 09/19/2018   AST 14 09/19/2018   NA 142 09/19/2018   K 4.7 09/19/2018   CL 104 09/19/2018   CREATININE 0.78 09/19/2018   BUN 21 09/19/2018   CO2 28 09/19/2018   TSH 2.57 03/30/2015   HGBA1C 5.8 05/09/2018   Assessment and Plan: See notes  Follow Up Instructions: See notes   I discussed the assessment and treatment plan with the patient. The patient was provided an opportunity to ask questions and all were answered. The patient agreed with the plan and demonstrated an understanding of the instructions.   The patient was advised to call back or seek an in-person evaluation if the symptoms worsen or if the condition fails to improve as anticipated.   Cathlean Cower, MD

## 2019-03-06 NOTE — Telephone Encounter (Signed)
Call to patient. Patient had I&D of labial abscess done on 02-27-2019 with Dr. Sabra Heck. Patient states she has been running a low grade fever every day since 02-28-2019. Patient states that this morning at 3 am she woke up with chills. Checked her temperature and it was 102.9, took 600mg  of ibuprofen and slept until 6 am. Temperature at 6am was 100.7. Current temperature is 101.7. Denies pain at I&D site, redness, swelling or drainage. States the area "feels healed." States she will finish the Bactrim prescribed tomorrow. Patient is complaining of nausea, headache, body aches, sinus drainage and coughing up phlegm. States she is "not aware" of any recent exposure to anyone with covid. States she always wears her mask in public and uses hand sanitizer. RN advised would need to review with covering provider, Dr. Talbert Nan and return call. Patient agreeable.   Routing to provider for review.

## 2019-03-06 NOTE — Telephone Encounter (Signed)
Call to patient. Patient advised to follow up with Dr. Sharlet Salina. Patient advised will call for an appointment.   Routing to provider and will close encounter.   Cc Dr. Sabra Heck.

## 2019-03-07 LAB — NOVEL CORONAVIRUS, NAA: SARS-CoV-2, NAA: NOT DETECTED

## 2019-03-08 ENCOUNTER — Encounter (HOSPITAL_COMMUNITY): Payer: Self-pay

## 2019-03-08 ENCOUNTER — Emergency Department (HOSPITAL_COMMUNITY)
Admission: EM | Admit: 2019-03-08 | Discharge: 2019-03-08 | Disposition: A | Discharge status: Home / Self Care | Payer: Medicare Other | Attending: Emergency Medicine | Admitting: Emergency Medicine

## 2019-03-08 ENCOUNTER — Emergency Department (HOSPITAL_COMMUNITY): Payer: Medicare Other

## 2019-03-08 ENCOUNTER — Other Ambulatory Visit: Payer: Self-pay

## 2019-03-08 DIAGNOSIS — R509 Fever, unspecified: Secondary | ICD-10-CM | POA: Insufficient documentation

## 2019-03-08 DIAGNOSIS — Z79899 Other long term (current) drug therapy: Secondary | ICD-10-CM | POA: Insufficient documentation

## 2019-03-08 DIAGNOSIS — B349 Viral infection, unspecified: Secondary | ICD-10-CM | POA: Diagnosis not present

## 2019-03-08 DIAGNOSIS — I10 Essential (primary) hypertension: Secondary | ICD-10-CM | POA: Insufficient documentation

## 2019-03-08 LAB — CBC WITH DIFFERENTIAL/PLATELET
Abs Immature Granulocytes: 0.05 10*3/uL (ref 0.00–0.07)
Basophils Absolute: 0 10*3/uL (ref 0.0–0.1)
Basophils Relative: 0 %
Eosinophils Absolute: 0.2 10*3/uL (ref 0.0–0.5)
Eosinophils Relative: 5 %
HCT: 38.2 % (ref 36.0–46.0)
Hemoglobin: 12.2 g/dL (ref 12.0–15.0)
Immature Granulocytes: 1 %
Lymphocytes Relative: 17 %
Lymphs Abs: 0.7 10*3/uL (ref 0.7–4.0)
MCH: 27.3 pg (ref 26.0–34.0)
MCHC: 31.9 g/dL (ref 30.0–36.0)
MCV: 85.5 fL (ref 80.0–100.0)
Monocytes Absolute: 0.3 10*3/uL (ref 0.1–1.0)
Monocytes Relative: 7 %
Neutro Abs: 2.8 10*3/uL (ref 1.7–7.7)
Neutrophils Relative %: 70 %
Platelets: 101 10*3/uL — ABNORMAL LOW (ref 150–400)
RBC: 4.47 MIL/uL (ref 3.87–5.11)
RDW: 14.5 % (ref 11.5–15.5)
WBC: 3.9 10*3/uL — ABNORMAL LOW (ref 4.0–10.5)
nRBC: 0 % (ref 0.0–0.2)

## 2019-03-08 LAB — URINALYSIS, ROUTINE W REFLEX MICROSCOPIC
Bacteria, UA: NONE SEEN
Bilirubin Urine: NEGATIVE
Glucose, UA: NEGATIVE mg/dL
Hgb urine dipstick: NEGATIVE
Ketones, ur: NEGATIVE mg/dL
Leukocytes,Ua: NEGATIVE
Nitrite: NEGATIVE
Protein, ur: 30 mg/dL — AB
Specific Gravity, Urine: 1.02 (ref 1.005–1.030)
pH: 5 (ref 5.0–8.0)

## 2019-03-08 LAB — COMPREHENSIVE METABOLIC PANEL
ALT: 24 U/L (ref 0–44)
AST: 25 U/L (ref 15–41)
Albumin: 3.8 g/dL (ref 3.5–5.0)
Alkaline Phosphatase: 120 U/L (ref 38–126)
Anion gap: 8 (ref 5–15)
BUN: 18 mg/dL (ref 8–23)
CO2: 26 mmol/L (ref 22–32)
Calcium: 8.6 mg/dL — ABNORMAL LOW (ref 8.9–10.3)
Chloride: 104 mmol/L (ref 98–111)
Creatinine, Ser: 1.19 mg/dL — ABNORMAL HIGH (ref 0.44–1.00)
GFR calc Af Amer: 54 mL/min — ABNORMAL LOW (ref >60)
GFR calc non Af Amer: 47 mL/min — ABNORMAL LOW (ref >60)
Glucose, Bld: 119 mg/dL — ABNORMAL HIGH (ref 70–99)
Potassium: 3.9 mmol/L (ref 3.5–5.1)
Sodium: 138 mmol/L (ref 135–145)
Total Bilirubin: 0.5 mg/dL (ref 0.3–1.2)
Total Protein: 6.7 g/dL (ref 6.5–8.1)

## 2019-03-08 MED ORDER — FLUTICASONE PROPIONATE 50 MCG/ACT NA SUSP: 1.0000 | Freq: Every day | NASAL | 0 refills | Status: DC

## 2019-03-08 MED ORDER — ACETAMINOPHEN 500 MG PO TABS
1000.0000 mg | ORAL_TABLET | Freq: Once | ORAL | Status: AC
  Administered 2019-03-08: 18:00:00 1000 mg via ORAL
  Filled 2019-03-08: qty 2

## 2019-03-08 MED ORDER — KETOROLAC TROMETHAMINE 15 MG/ML IJ SOLN: 15.0000 mg | Freq: Once | INTRAMUSCULAR | Status: DC

## 2019-03-08 MED ORDER — SODIUM CHLORIDE 0.9 % IV BOLUS
1000.0000 mL | Freq: Once | INTRAVENOUS | Status: AC
  Administered 2019-03-08: 18:00:00 1000 mL via INTRAVENOUS

## 2019-03-08 MED ORDER — KETOROLAC TROMETHAMINE 15 MG/ML IJ SOLN
7.5000 mg | Freq: Once | INTRAMUSCULAR | Status: AC
  Administered 2019-03-08: 22:00:00 7.5 mg via INTRAVENOUS
  Filled 2019-03-08: qty 1

## 2019-03-08 NOTE — Discharge Instructions (Signed)
You likely have a viral illness.  This should be treated symptomatically. Use Tylenol or ibuprofen as needed for fevers or body aches. Use Flonase daily for nasal congestion and morning cough. Make sure you stay well-hydrated with water. Wash your hands frequently to prevent spread of infection. Follow-up with your primary care doctor in 1 week if your symptoms are not improving. Return to the emergency room if you develop chest pain, difficulty breathing, or any new or worsening symptoms.

## 2019-03-08 NOTE — ED Notes (Signed)
Patient transported to X-ray 

## 2019-03-08 NOTE — ED Notes (Addendum)
Pt ambulated to the bathroom by staff without assistance. Gait steady

## 2019-03-08 NOTE — ED Notes (Signed)
Pt verbalized discharge instructions and follow up care. Alert and ambulatory. No IV. Husband is driving pt home

## 2019-03-08 NOTE — ED Triage Notes (Addendum)
Pt has had a fever since labial cyst removal 02/27/19. Pt states no relief after taking ibuprofen and 2 different types of abx. Pt states that she was told there was bacteria in the area, and that may be causing it. Pt states she is also achy in lower back and hips, causing pain when walking.

## 2019-03-08 NOTE — ED Notes (Addendum)
Urine and culture sent to lab  

## 2019-03-08 NOTE — ED Notes (Addendum)
Pt aware that urine sample is needed.  

## 2019-03-09 ENCOUNTER — Telehealth: Payer: Self-pay

## 2019-03-09 NOTE — Telephone Encounter (Signed)
Pt has been informed of results and expressed understanding.  °

## 2019-03-09 NOTE — ED Provider Notes (Signed)
[facility] DEPT Provider Note   CSN: [id number] Arrival date & time: 03/08/19  1714     History   Chief Complaint Chief Complaint  Patient presents with  . Fever    HPI Amy Williamson is a 68 y.o. female presenting for evaluation of fever.  Patient states she had a labial abscess 2 weeks ago which was drained.  She was placed on Bactrim at that time.  She was doing well for the first week, however she then developed a low-grade fever.  3 days ago, she developed a much higher fever, temperature up to 102.9.  Patient states she has been taking ibuprofen several times a day, and her fever has not gone below 100.  She reports generalized body aches.  She has associated nasal congestion and postnasal drip causing a morning cough.  She denies cough throughout the day, shortness of breath, chest pain, nausea, vomiting, abdominal pain, urinary symptoms, normal bowel movements.  She denies pain or drainage from place for her labia was drained.     HPI  Past Medical History:  Diagnosis Date  . Arthritis    BACK AND SHOULDERS  . Bronchitis 07-14-11  . Cataract immature BILATERAL  . Depressive disorder, not elsewhere classified   . Dysphagia   . History of esophageal reflux   . History of migraine headaches   . History of pneumonia 05-14-11  . Obesity, unspecified   . Palpitations   . Plantar fasciitis 08/2017   left foot   . Pure hypercholesterolemia   . Unspecified essential hypertension   . Unspecified venous (peripheral) insufficiency     Patient Active Problem List   Diagnosis Date Noted  . Sinus infection 03/06/2019  . Nausea 03/06/2019  . Exposure to Covid-19 Virus 03/06/2019  . SOB (shortness of breath) 09/19/2018  . Insomnia 09/19/2018  . Cough 04/02/2018  . Routine general medical examination at a health care facility 04/02/2016  . Other dysphagia 09/21/2013  . Palpitations 03/24/2013  . Morbid obesity ([place]) 03/24/2013  . DJD  (degenerative joint disease) 03/24/2013  . GERD (gastroesophageal reflux disease) 02/27/2012  . Hyperlipidemia 01/21/2008  . Essential hypertension 01/21/2008    Past Surgical History:  Procedure Laterality Date  . CARPAL TUNNEL RELEASE Right 2004  . CARPAL TUNNEL RELEASE Left 2006  . CESAREAN SECTION  [id number]  . EAR CYST EXCISION  08/03/2011   patient states this is incorrect  . KNEE ARTHROSCOPY    . KNEE ARTHROSCOPY W/ MENISCAL REPAIR  08/03/2011   with popliteal cyst excition  . KNEE ARTHROSCOPY WITH EXCISION BAKER'S CYST    . PULLEY RELEASE RIGHT TRIGGER FINGER Right 01/08/2011  . TRIGGER FINGER RELEASE Right 04/20/13  . TRIGGER FINGER RELEASE Left 05/06/14  . TUBAL LIGATION  1977   with second cesarean section     OB History    Gravida  2   Para  2   Term      Preterm      AB      Living  2     SAB      TAB      Ectopic      Multiple      Live Births               Home Medications    Prior to Admission medications   Medication Sig Start Date End Date Taking? Authorizing Provider  amLODipine (NORVASC) 5 MG tablet TAKE 1 TABLET BY MOUTH EVERY DAY Patient  taking differently: Take 5 mg by mouth daily.  10/29/18  Yes Hoyt Koch, MD  cefdinir (OMNICEF) 300 MG capsule Take 1 capsule (300 mg total) by mouth 2 (two) times daily. 03/06/19  Yes Biagio Borg, MD  diphenhydramine-acetaminophen (TYLENOL PM) 25-500 MG TABS tablet Take 1 tablet by mouth at bedtime as needed (sleep).    Yes [provider]  fluconazole (DIFLUCAN) 150 MG tablet 1 tab po x 1, repeat in 72 hours if needed. Patient taking differently: Take 150 mg by mouth See admin instructions. 1 tab po x 1, repeat in 72 hours if needed for yeast infection. 03/01/19  Yes Megan Salon, MD  Ibuprofen 200 MG CAPS Take 800 mg by mouth 2 (two) times daily as needed (headache and fever).    Yes [provider]  omeprazole (PRILOSEC) 20 MG capsule Take 1 capsule (20 mg total) by  mouth daily. 08/04/18  Yes Hoyt Koch, MD  ondansetron (ZOFRAN) 4 MG tablet Take 1 tablet (4 mg total) by mouth every 8 (eight) hours as needed for nausea or vomiting. 03/06/19  Yes Biagio Borg, MD  fluticasone [facility]) 50 MCG/ACT nasal spray Place 1 spray into both nostrils daily. 03/08/19   Verdean Murin, PA-C  sulfamethoxazole-trimethoprim (BACTRIM DS) 800-160 MG tablet Take 1 tablet by mouth 2 (two) times daily. Patient not taking: Reported on 03/08/2019 03/01/19   Megan Salon, MD    Family History Family History  Problem Relation Age of Onset  . Heart failure Mother   . Osteoporosis Mother   . Hyperlipidemia Mother   . Hypertension Mother   . Heart disease Father   . Clotting disorder Father   . Liver cancer Brother   . Liver disease Maternal Aunt        x2  . Colon cancer Neg Hx     Social History Social History   Tobacco Use  . Smoking status: Never Smoker  . Smokeless tobacco: Never Used  Substance Use Topics  . Alcohol use: No  . Drug use: No     Allergies   Atenolol, Avelox [moxifloxacin hcl in nacl], Azithromycin, and Doxycycline   Review of Systems Review of Systems  Constitutional: Positive for fever.  Musculoskeletal: Positive for myalgias.  All other systems reviewed and are negative.    Physical Exam Updated Vital Signs BP 107/81 (BP Location: Left Arm)   Pulse 84   Temp 99.4 F (37.4 C) (Oral)   Resp 16   Ht 5' (1.524 m)   Wt 95.3 kg   LMP 07/23/2004   SpO2 95%   BMI 41.01 kg/m   Physical Exam Vitals signs and nursing note reviewed. Exam conducted with a chaperone present.  Constitutional:      General: She is not in acute distress.    Appearance: She is well-developed.  HENT:     Head: Normocephalic and atraumatic.     Comments: OP clear without tonsillar swelling or exudate.  Uvula midline with equal palate rise. Several red and tender lesions on the palate.  Eyes:     Extraocular Movements: Extraocular movements  intact.     Conjunctiva/sclera: Conjunctivae normal.     Pupils: Pupils are equal, round, and reactive to light.  Neck:     Musculoskeletal: Normal range of motion and neck supple.  Cardiovascular:     Rate and Rhythm: Normal rate and regular rhythm.     Pulses: Normal pulses.  Pulmonary:     Effort: Pulmonary effort  is normal. No respiratory distress.     Breath sounds: Normal breath sounds. No wheezing.     Comments: Speaking in full sentences.  Clear lung sounds all fields. Abdominal:     General: There is no distension.     Palpations: Abdomen is soft. There is no mass.     Tenderness: There is no abdominal tenderness. There is no guarding or rebound.  Genitourinary:      Comments: Location of patient's previous infected cyst.  No erythema, induration, tenderness, or signs of infection at this time. Musculoskeletal: Normal range of motion.  Skin:    General: Skin is warm and dry.     Capillary Refill: Capillary refill takes less than 2 seconds.  Neurological:     Mental Status: She is alert and oriented to person, place, and time.      ED Treatments / Results  Labs (all labs ordered are listed, but only abnormal results are displayed) Labs Reviewed  CBC WITH DIFFERENTIAL/PLATELET - Abnormal; Notable for the following components:      Result Value   WBC 3.9 (*)    Platelets 101 (*)    All other components within normal limits  COMPREHENSIVE METABOLIC PANEL - Abnormal; Notable for the following components:   Glucose, Bld 119 (*)    Creatinine, Ser 1.19 (*)    Calcium 8.6 (*)    GFR calc non Af Amer 47 (*)    GFR calc Af Amer 54 (*)    All other components within normal limits  URINALYSIS, ROUTINE W REFLEX MICROSCOPIC - Abnormal; Notable for the following components:   Protein, ur 30 (*)    All other components within normal limits  URINE CULTURE    EKG None  Radiology Dg Chest 2 View  Result Date: 03/08/2019 CLINICAL DATA:  Pt has had a fever since labial  cyst removal 02/27/19. Pt states no relief after taking ibuprofen and 2 different types of abx. Pt states that she was told there was bacteria in the area, and that may be causing it. Also having som.*comment was truncated*fever, neg covid EXAM: CHEST - 2 VIEW COMPARISON:  Radiograph 09/19/2018 FINDINGS: Normal mediastinum and cardiac silhouette. Normal pulmonary vasculature. No evidence of effusion, infiltrate, or pneumothorax. No acute bony abnormality. IMPRESSION: No acute cardiopulmonary process. Electronically Signed   By: Suzy Bouchard M.D.   On: 03/08/2019 18:21    Procedures Procedures (including critical care time)  Medications Ordered in ED Medications  sodium chloride 0.9 % bolus 1,000 mL (0 mLs Intravenous Stopped 03/08/19 2033)  acetaminophen (TYLENOL) tablet 1,000 mg (1,000 mg Oral Given 03/08/19 1815)  ketorolac (TORADOL) 15 MG/ML injection 7.5 mg (7.5 mg Intravenous Given 03/08/19 2133)     Initial Impression / Assessment and Plan / ED Course  I have reviewed the triage vital signs and the nursing notes.  Pertinent labs & imaging results that were available during my care of the patient were reviewed by me and considered in my medical decision making (see chart for details).        Patient presenting for evaluation of seizure.  Physical exam shows patient who appears nontoxic.  No obvious bacterial cause for patient's fever.  However, considering elevated fever in the setting of an elderly female, will obtain chest x-ray, urine, and labs.  Consider viral cause such as herpangina due to lesions on the roof of the mouth.  Patient tested negative for COVID 2 days ago, no cough, shortness of breath, or respiratory symptoms, as  such less likely to be COVID.  Labs overall reassuring.  No leukocytosis.  Labs show slight AKI, likely due to dehydration.  Fluid bolus given.  Reviewed interpreted by me, no pneumonia, pneumothorax, effusion, cardiomegaly.  Urine without infection.   Temperature improved to 99.4 with Tylenol.  On reassessment, patient reports she is feeling better.  Case discussed with attending, Dr. Sedonia Small evaluated the patient.  Discussed with patient that she likely has a viral illness, which needs to be treated symptomatically.  Encourage follow-up with primary care symptoms not proving.  At this time, patient appears safe for discharge.  Return precautions given.  Patient states she understands and agrees to plan.   Final Clinical Impressions(s) / ED Diagnoses   Final diagnoses:  Viral illness  Fever, unspecified fever cause    ED Discharge Orders         Ordered    fluticasone (FLONASE) 50 MCG/ACT nasal spray  Daily     03/08/19 2120           Franchot Heidelberg, PA-C 03/09/19 0057    Maudie Flakes, MD 03/11/19 [phone]

## 2019-03-09 NOTE — Telephone Encounter (Signed)
-----   Message from Biagio Borg, MD sent at 03/07/2019  9:12 PM EDT ----- Left message on MyChart, pt to cont same tx   [place] to please inform pt, COVID is neg

## 2019-03-10 LAB — URINE CULTURE: Culture: NO GROWTH

## 2019-03-19 ENCOUNTER — Other Ambulatory Visit: Payer: Self-pay

## 2019-03-19 ENCOUNTER — Ambulatory Visit
Admission: RE | Admit: 2019-03-19 | Discharge: 2019-03-19 | Disposition: A | Discharge status: Home / Self Care | Payer: Medicare Other | Source: Ambulatory Visit | Attending: Obstetrics & Gynecology | Admitting: Obstetrics & Gynecology

## 2019-03-19 DIAGNOSIS — Z1231 Encounter for screening mammogram for malignant neoplasm of breast: Secondary | ICD-10-CM

## 2019-04-22 ENCOUNTER — Other Ambulatory Visit: Payer: Self-pay | Admitting: Internal Medicine

## 2019-05-12 ENCOUNTER — Ambulatory Visit (INDEPENDENT_AMBULATORY_CARE_PROVIDER_SITE_OTHER): Payer: Medicare Other | Admitting: Internal Medicine

## 2019-05-12 ENCOUNTER — Other Ambulatory Visit: Payer: Self-pay

## 2019-05-12 ENCOUNTER — Encounter: Payer: Self-pay | Admitting: Internal Medicine

## 2019-05-12 ENCOUNTER — Other Ambulatory Visit (INDEPENDENT_AMBULATORY_CARE_PROVIDER_SITE_OTHER): Payer: Medicare Other

## 2019-05-12 VITALS — BP 118/78 | HR 77 | Temp 98.5°F | Ht 60.0 in | Wt 210.0 lb

## 2019-05-12 DIAGNOSIS — I10 Essential (primary) hypertension: Secondary | ICD-10-CM | POA: Diagnosis not present

## 2019-05-12 DIAGNOSIS — Z0000 Encounter for general adult medical examination without abnormal findings: Secondary | ICD-10-CM

## 2019-05-12 DIAGNOSIS — K219 Gastro-esophageal reflux disease without esophagitis: Secondary | ICD-10-CM | POA: Diagnosis not present

## 2019-05-12 LAB — CBC
HCT: 39.1 % (ref 36.0–46.0)
Hemoglobin: 13.1 g/dL (ref 12.0–15.0)
MCHC: 33.5 g/dL (ref 30.0–36.0)
MCV: 83.2 fl (ref 78.0–100.0)
Platelets: 227 10*3/uL (ref 150.0–400.0)
RBC: 4.7 Mil/uL (ref 3.87–5.11)
RDW: 15.5 % (ref 11.5–15.5)
WBC: 7.2 10*3/uL (ref 4.0–10.5)

## 2019-05-12 LAB — LIPID PANEL
Cholesterol: 228 mg/dL — ABNORMAL HIGH (ref 0–200)
HDL: 52.1 mg/dL (ref >39.00)
LDL Cholesterol: 150 mg/dL — ABNORMAL HIGH (ref 0–99)
NonHDL: 176.28
Total CHOL/HDL Ratio: 4
Triglycerides: 132 mg/dL (ref 0.0–149.0)
VLDL: 26.4 mg/dL (ref 0.0–40.0)

## 2019-05-12 LAB — COMPREHENSIVE METABOLIC PANEL
ALT: 16 U/L (ref 0–35)
AST: 16 U/L (ref 0–37)
Albumin: 4.5 g/dL (ref 3.5–5.2)
Alkaline Phosphatase: 74 U/L (ref 39–117)
BUN: 20 mg/dL (ref 6–23)
CO2: 28 mEq/L (ref 19–32)
Calcium: 9.6 mg/dL (ref 8.4–10.5)
Chloride: 103 mEq/L (ref 96–112)
Creatinine, Ser: 0.77 mg/dL (ref 0.40–1.20)
GFR: 74.21 mL/min (ref >60.00)
Glucose, Bld: 105 mg/dL — ABNORMAL HIGH (ref 70–99)
Potassium: 3.9 mEq/L (ref 3.5–5.1)
Sodium: 139 mEq/L (ref 135–145)
Total Bilirubin: 0.6 mg/dL (ref 0.2–1.2)
Total Protein: 6.7 g/dL (ref 6.0–8.3)

## 2019-05-12 LAB — HEMOGLOBIN A1C: Hgb A1c MFr Bld: 5.8 % (ref 4.6–6.5)

## 2019-05-12 NOTE — Assessment & Plan Note (Signed)
Taking omeprazole and symptoms well controlled at this time.

## 2019-05-12 NOTE — Assessment & Plan Note (Signed)
Weight stable and she declines ability to work on this at this time due to pandemic.

## 2019-05-12 NOTE — Progress Notes (Signed)
Subjective:   Patient ID: Amy Williamson, female    DOB: [birth year], 68 y.o.   MRN: [id number]  HPI Here for medicare wellness and physical, no new complaints. Please see A/P for status and treatment of chronic medical problems.   Diet: heart healthy Physical activity: sedentary Depression/mood screen: positive, addressed declines meds Hearing: intact to whispered voice, mild loss bilaterally Visual acuity: grossly normal, performs annual eye exam  ADLs: capable Fall risk: none Home safety: good Cognitive evaluation: intact to orientation, naming, recall and repetition EOL planning: adv directives discussed    Office Visit from 09/19/2018 in [place]  PHQ-2 Total Score  4        Office Visit from 09/19/2018 in [place]  PHQ-9 Total Score  15      I have personally reviewed and have noted 1. The patient's medical and social history - reviewed today no changes 2. Their use of alcohol, tobacco or illicit drugs 3. Their current medications and supplements 4. The patient's functional ability including ADL's, fall risks, home safety risks and hearing or visual impairment. 5. Diet and physical activities 6. Evidence for depression or mood disorders 7. Care team reviewed and updated  Patient Care Team: Hoyt Koch, MD as PCP - General (Internal Medicine) Lafayette Dragon, MD (Inactive) (Gastroenterology) Megan Salon, MD (Gynecology) Peggye Form, MD (Gastroenterology) Calvert Cantor, MD (Ophthalmology) Roseanne Kaufman, MD (Orthopedic Surgery) Past Medical History:  Diagnosis Date  . Arthritis    BACK AND SHOULDERS  . Bronchitis 07-14-11  . Cataract immature BILATERAL  . Depressive disorder, not elsewhere classified   . Dysphagia   . History of esophageal reflux   . History of migraine headaches   . History of pneumonia 05-14-11  . Obesity, unspecified   . Palpitations   . Plantar fasciitis 08/2017   left foot   . Pure hypercholesterolemia   . Unspecified essential hypertension   . Unspecified venous (peripheral) insufficiency    Past Surgical History:  Procedure Laterality Date  . CARPAL TUNNEL RELEASE Right 2004  . CARPAL TUNNEL RELEASE Left 2006  . CESAREAN SECTION  [id number]  . EAR CYST EXCISION  08/03/2011   patient states this is incorrect  . KNEE ARTHROSCOPY    . KNEE ARTHROSCOPY W/ MENISCAL REPAIR  08/03/2011   with popliteal cyst excition  . KNEE ARTHROSCOPY WITH EXCISION BAKER'S CYST    . PULLEY RELEASE RIGHT TRIGGER FINGER Right 01/08/2011  . TRIGGER FINGER RELEASE Right 04/20/13  . TRIGGER FINGER RELEASE Left 05/06/14  . TUBAL LIGATION  1977   with second cesarean section   Family History  Problem Relation Age of Onset  . Heart failure Mother   . Osteoporosis Mother   . Hyperlipidemia Mother   . Hypertension Mother   . Heart disease Father   . Clotting disorder Father   . Liver cancer Brother   . Liver disease Maternal Aunt        x2  . Colon cancer Neg Hx     Review of Systems  Constitutional: Negative.   HENT: Negative.   Eyes: Negative.   Respiratory: Negative for cough, chest tightness and shortness of breath.   Cardiovascular: Negative for chest pain, palpitations and leg swelling.  Gastrointestinal: Negative for abdominal distention, abdominal pain, constipation, diarrhea, nausea and vomiting.  Musculoskeletal: Negative.   Skin: Negative.   Neurological: Negative.   Psychiatric/Behavioral: Negative.     Objective:  Physical Exam Constitutional:  Appearance: She is well-developed.  HENT:     Head: Normocephalic and atraumatic.  Neck:     Musculoskeletal: Normal range of motion.  Cardiovascular:     Rate and Rhythm: Normal rate and regular rhythm.  Pulmonary:     Effort: Pulmonary effort is normal. No respiratory distress.     Breath sounds: Normal breath sounds. No wheezing or rales.  Abdominal:     General: Bowel sounds are normal.  There is no distension.     Palpations: Abdomen is soft.     Tenderness: There is no abdominal tenderness. There is no rebound.  Skin:    General: Skin is warm and dry.  Neurological:     Mental Status: She is alert and oriented to person, place, and time.     Coordination: Coordination normal.     Vitals:   05/12/19 0828 05/12/19 0905  BP: (!) 160/100 118/78  Pulse: 77   Temp: 98.5 F (36.9 C)   TempSrc: Oral   SpO2: 98%   Weight: 210 lb (95.3 kg)   Height: 5' (1.524 m)     Assessment & Plan:

## 2019-05-12 NOTE — Assessment & Plan Note (Signed)
Controlled on amlodipine 5 mg daily. Checking CMP and adjust as needed.

## 2019-05-12 NOTE — Assessment & Plan Note (Signed)
Flu shot up to date. Pneumonia complete. Shingrix counseled. Tetanus due 2027. Colonoscopy due 2023. Mammogram due 2022, pap smear aged out and dexa due declines given pandemic. Counseled about sun safety and mole surveillance. Counseled about the dangers of distracted driving. Given 10 year screening recommendations.

## 2019-05-12 NOTE — Patient Instructions (Signed)
Health Maintenance, Female Adopting a healthy lifestyle and getting preventive care are important in promoting health and wellness. Ask your health care provider about:  The right schedule for you to have regular tests and exams.  Things you can do on your own to prevent diseases and keep yourself healthy. What should I know about diet, weight, and exercise? Eat a healthy diet   Eat a diet that includes plenty of vegetables, fruits, low-fat dairy products, and lean protein.  Do not eat a lot of foods that are high in solid fats, added sugars, or sodium. Maintain a healthy weight Body mass index (BMI) is used to identify weight problems. It estimates body fat based on height and weight. Your health care provider can help determine your BMI and help you achieve or maintain a healthy weight. Get regular exercise Get regular exercise. This is one of the most important things you can do for your health. Most adults should:  Exercise for at least 150 minutes each week. The exercise should increase your heart rate and make you sweat (moderate-intensity exercise).  Do strengthening exercises at least twice a week. This is in addition to the moderate-intensity exercise.  Spend less time sitting. Even light physical activity can be beneficial. Watch cholesterol and blood lipids Have your blood tested for lipids and cholesterol at 69 years of age, then have this test every 5 years. Have your cholesterol levels checked more often if:  Your lipid or cholesterol levels are high.  You are older than 69 years of age.  You are at high risk for heart disease. What should I know about cancer screening? Depending on your health history and family history, you may need to have cancer screening at various ages. This may include screening for:  Breast cancer.  Cervical cancer.  Colorectal cancer.  Skin cancer.  Lung cancer. What should I know about heart disease, diabetes, and high blood  pressure? Blood pressure and heart disease  High blood pressure causes heart disease and increases the risk of stroke. This is more likely to develop in people who have high blood pressure readings, are of African descent, or are overweight.  Have your blood pressure checked: ? Every 3-5 years if you are 18-39 years of age. ? Every year if you are 40 years old or older. Diabetes Have regular diabetes screenings. This checks your fasting blood sugar level. Have the screening done:  Once every three years after age 40 if you are at a normal weight and have a low risk for diabetes.  More often and at a younger age if you are overweight or have a high risk for diabetes. What should I know about preventing infection? Hepatitis B If you have a higher risk for hepatitis B, you should be screened for this virus. Talk with your health care provider to find out if you are at risk for hepatitis B infection. Hepatitis C Testing is recommended for:  Everyone born from 1945 through 1965.  Anyone with known risk factors for hepatitis C. Sexually transmitted infections (STIs)  Get screened for STIs, including gonorrhea and chlamydia, if: ? You are sexually active and are younger than 69 years of age. ? You are older than 69 years of age and your health care provider tells you that you are at risk for this type of infection. ? Your sexual activity has changed since you were last screened, and you are at increased risk for chlamydia or gonorrhea. Ask your health care provider if   you are at risk.  Ask your health care provider about whether you are at high risk for HIV. Your health care provider may recommend a prescription medicine to help prevent HIV infection. If you choose to take medicine to prevent HIV, you should first get tested for HIV. You should then be tested every 3 months for as long as you are taking the medicine. Pregnancy  If you are about to stop having your period (premenopausal) and  you may become pregnant, seek counseling before you get pregnant.  Take 400 to 800 micrograms (mcg) of folic acid every day if you become pregnant.  Ask for birth control (contraception) if you want to prevent pregnancy. Osteoporosis and menopause Osteoporosis is a disease in which the bones lose minerals and strength with aging. This can result in bone fractures. If you are 65 years old or older, or if you are at risk for osteoporosis and fractures, ask your health care provider if you should:  Be screened for bone loss.  Take a calcium or vitamin D supplement to lower your risk of fractures.  Be given hormone replacement therapy (HRT) to treat symptoms of menopause. Follow these instructions at home: Lifestyle  Do not use any products that contain nicotine or tobacco, such as cigarettes, e-cigarettes, and chewing tobacco. If you need help quitting, ask your health care provider.  Do not use street drugs.  Do not share needles.  Ask your health care provider for help if you need support or information about quitting drugs. Alcohol use  Do not drink alcohol if: ? Your health care provider tells you not to drink. ? You are pregnant, may be pregnant, or are planning to become pregnant.  If you drink alcohol: ? Limit how much you use to 0-1 drink a day. ? Limit intake if you are breastfeeding.  Be aware of how much alcohol is in your drink. In the [place] one drink equals one 12 oz bottle of beer (355 mL), one 5 oz glass of wine (148 mL), or one 1 oz glass of hard liquor (44 mL). General instructions  Schedule regular health, dental, and eye exams.  Stay current with your vaccines.  Tell your health care provider if: ? You often feel depressed. ? You have ever been abused or do not feel safe at home. Summary  Adopting a healthy lifestyle and getting preventive care are important in promoting health and wellness.  Follow your health care provider's instructions about healthy  diet, exercising, and getting tested or screened for diseases.  Follow your health care provider's instructions on monitoring your cholesterol and blood pressure. This information is not intended to replace advice given to you by your health care provider. Make sure you discuss any questions you have with your health care provider. Document Released: 01/22/2011 Document Revised: 07/02/2018 Document Reviewed: 07/02/2018 Elsevier Patient Education  2020 Elsevier Inc.  

## 2019-05-15 ENCOUNTER — Telehealth: Payer: Self-pay

## 2019-05-15 NOTE — Telephone Encounter (Signed)
Copied from [place] [id number]. Topic: General - Other >> May 15, 2019  2:14 PM Leward Quan A wrote: Reason for CRM: Patient called to inform Dr Sharlet Salina that she would like to start on the Cholesterol medication suggested at last appointment since she noticed that he numbers have become elevated. Please call Ph# [phone]

## 2019-05-18 MED ORDER — SIMVASTATIN 20 MG PO TABS: 20.0000 mg | ORAL_TABLET | Freq: Every day | ORAL | 3 refills | Status: DC

## 2019-05-18 NOTE — Addendum Note (Signed)
Addended by: Pricilla Holm A on: 05/18/2019 10:03 AM   Modules accepted: Orders

## 2019-05-18 NOTE — Telephone Encounter (Signed)
Sent in simvastatin. Should come back for recheck in 6 months.

## 2019-05-18 NOTE — Telephone Encounter (Signed)
Patient informed of MD response and stated understanding  

## 2019-06-04 ENCOUNTER — Ambulatory Visit (INDEPENDENT_AMBULATORY_CARE_PROVIDER_SITE_OTHER): Payer: Medicare Other

## 2019-06-04 ENCOUNTER — Other Ambulatory Visit: Payer: Self-pay

## 2019-06-04 ENCOUNTER — Ambulatory Visit
Admission: EM | Admit: 2019-06-04 | Discharge: 2019-06-04 | Disposition: A | Discharge status: Home / Self Care | Payer: Medicare Other | Attending: Emergency Medicine | Admitting: Emergency Medicine

## 2019-06-04 ENCOUNTER — Encounter: Payer: Self-pay | Admitting: Emergency Medicine

## 2019-06-04 DIAGNOSIS — R05 Cough: Secondary | ICD-10-CM

## 2019-06-04 DIAGNOSIS — J40 Bronchitis, not specified as acute or chronic: Secondary | ICD-10-CM | POA: Diagnosis not present

## 2019-06-04 DIAGNOSIS — R059 Cough, unspecified: Secondary | ICD-10-CM

## 2019-06-04 DIAGNOSIS — Z20828 Contact with and (suspected) exposure to other viral communicable diseases: Secondary | ICD-10-CM

## 2019-06-04 MED ORDER — PREDNISONE 20 MG PO TABS: 40.0000 mg | ORAL_TABLET | Freq: Every day | ORAL | 0 refills | Status: AC

## 2019-06-04 MED ORDER — ALBUTEROL SULFATE (2.5 MG/3ML) 0.083% IN NEBU: 2.5000 mg | INHALATION_SOLUTION | Freq: Four times a day (QID) | RESPIRATORY_TRACT | 12 refills | Status: DC | PRN

## 2019-06-04 NOTE — ED Triage Notes (Signed)
Pt presents to [facility] for assessment of cough, nasal drainage, wheezing, temp of 99.7 x 1.5 weeks.  Cough is productive with yellow mucous.  Denies headaches.  Denies n/v/d.  C/o some shortness of breath at night.

## 2019-06-04 NOTE — ED Provider Notes (Signed)
[facility]    CSN: [id number] Arrival date & time: 06/04/19  1613      History   Chief Complaint Chief Complaint  Patient presents with  . Sinus Drainage    HPI Amy Williamson is a 68 y.o. female with history of hypertension, obesity, GERD presenting for 2-week course of cough, nasal congestion, wheezing.  Patient has had elevated temperature at home for which she is been taking Tylenol with adequate relief.  Of note, patient took single dose of Tylenol 1 hour PTA.  Patient denies chest pain, shortness of breath.  No known Covid contacts, there is interested in testing given global pandemic.  Patient states cough is productive with yellow sputum, though attributes this to her postnasal drip.  Past Medical History:  Diagnosis Date  . Arthritis    BACK AND SHOULDERS  . Bronchitis 07-14-11  . Cataract immature BILATERAL  . Depressive disorder, not elsewhere classified   . Dysphagia   . History of esophageal reflux   . History of migraine headaches   . History of pneumonia 05-14-11  . Obesity, unspecified   . Palpitations   . Plantar fasciitis 08/2017   left foot   . Pure hypercholesterolemia   . Unspecified essential hypertension   . Unspecified venous (peripheral) insufficiency     Patient Active Problem List   Diagnosis Date Noted  . Routine general medical examination at a health care facility 04/02/2016  . Other dysphagia 09/21/2013  . Palpitations 03/24/2013  . Morbid obesity ([place]) 03/24/2013  . DJD (degenerative joint disease) 03/24/2013  . GERD (gastroesophageal reflux disease) 02/27/2012  . Hyperlipidemia 01/21/2008  . Essential hypertension 01/21/2008    Past Surgical History:  Procedure Laterality Date  . CARPAL TUNNEL RELEASE Right 2004  . CARPAL TUNNEL RELEASE Left 2006  . CESAREAN SECTION  [id number]  . EAR CYST EXCISION  08/03/2011   patient states this is incorrect  . KNEE ARTHROSCOPY    . KNEE ARTHROSCOPY W/ MENISCAL REPAIR   08/03/2011   with popliteal cyst excition  . KNEE ARTHROSCOPY WITH EXCISION BAKER'S CYST    . PULLEY RELEASE RIGHT TRIGGER FINGER Right 01/08/2011  . TRIGGER FINGER RELEASE Right 04/20/13  . TRIGGER FINGER RELEASE Left 05/06/14  . TUBAL LIGATION  1977   with second cesarean section    OB History    Gravida  2   Para  2   Term      Preterm      AB      Living  2     SAB      TAB      Ectopic      Multiple      Live Births               Home Medications    Prior to Admission medications   Medication Sig Start Date End Date Taking? Authorizing Provider  albuterol (PROVENTIL) (2.5 MG/3ML) 0.083% nebulizer solution Take 3 mLs (2.5 mg total) by nebulization every 6 (six) hours as needed for wheezing or shortness of breath. 06/04/19   Hall-Potvin, [place], PA-C  amLODipine (NORVASC) 5 MG tablet TAKE 1 TABLET BY MOUTH EVERY DAY 04/22/19   Hoyt Koch, MD  diphenhydramine-acetaminophen (TYLENOL PM) 25-500 MG TABS tablet Take 1 tablet by mouth at bedtime as needed (sleep).     [provider]  fluticasone (FLONASE) 50 MCG/ACT nasal spray Place 1 spray into both nostrils daily. 03/08/19   Caccavale, Sophia, PA-C  Ibuprofen  200 MG CAPS Take 800 mg by mouth 2 (two) times daily as needed (headache and fever).     [provider]  omeprazole (PRILOSEC) 20 MG capsule Take 1 capsule (20 mg total) by mouth daily. 08/04/18   Hoyt Koch, MD  predniSONE (DELTASONE) 20 MG tablet Take 2 tablets (40 mg total) by mouth daily for 5 days. 06/04/19 06/09/19  Hall-Potvin, [place], PA-C  simvastatin (ZOCOR) 20 MG tablet Take 1 tablet (20 mg total) by mouth at bedtime. 05/18/19   Hoyt Koch, MD    Family History Family History  Problem Relation Age of Onset  . Heart failure Mother   . Osteoporosis Mother   . Hyperlipidemia Mother   . Hypertension Mother   . Heart disease Father   . Clotting disorder Father   . Liver cancer Brother   .  Liver disease Maternal Aunt        x2  . Colon cancer Neg Hx     Social History Social History   Tobacco Use  . Smoking status: Never Smoker  . Smokeless tobacco: Never Used  Substance Use Topics  . Alcohol use: No  . Drug use: No     Allergies   Atenolol, Avelox [moxifloxacin hcl in nacl], Azithromycin, and Doxycycline   Review of Systems Review of Systems  Constitutional: Positive for fever. Negative for activity change, appetite change and fatigue.  HENT: Positive for congestion. Negative for dental problem, ear pain, facial swelling, hearing loss, sinus pressure, sinus pain, sore throat, trouble swallowing and voice change.   Eyes: Negative for photophobia, pain and visual disturbance.  Respiratory: Positive for cough and wheezing. Negative for shortness of breath.   Cardiovascular: Negative for chest pain, palpitations and leg swelling.  Gastrointestinal: Negative for diarrhea and vomiting.  Musculoskeletal: Negative for arthralgias and myalgias.  Neurological: Negative for dizziness and headaches.     Physical Exam Triage Vital Signs ED Triage Vitals  Enc Vitals Group     BP      Pulse      Resp      Temp      Temp src      SpO2      Weight      Height      Head Circumference      Peak Flow      Pain Score      Pain Loc      Pain Edu?      Excl. in [place]?    No data found.  Updated Vital Signs BP (!) 154/76 (BP Location: Left Arm)   Pulse 87   Temp 99 F (37.2 C) (Temporal) Comment: Tylenol 1 hour ago  Resp 20   LMP 07/23/2004   SpO2 93%   Visual Acuity Right Eye Distance:   Left Eye Distance:   Bilateral Distance:    Right Eye Near:   Left Eye Near:    Bilateral Near:     Physical Exam Constitutional:      General: She is not in acute distress.    Appearance: She is obese. She is not toxic-appearing.  HENT:     Head: Normocephalic and atraumatic.     Right Ear: Tympanic membrane, ear canal and external ear normal.     Left Ear:  Tympanic membrane, ear canal and external ear normal.     Nose: Nose normal.     Comments: Negative sinus tenderness bilaterally    Mouth/Throat:     Mouth: Mucous  membranes are moist.     Pharynx: Oropharynx is clear. No posterior oropharyngeal erythema.  Eyes:     General: No scleral icterus.    Conjunctiva/sclera: Conjunctivae normal.     Pupils: Pupils are equal, round, and reactive to light.  Cardiovascular:     Rate and Rhythm: Normal rate and regular rhythm.     Heart sounds: No murmur. No gallop.   Pulmonary:     Effort: Pulmonary effort is normal. No respiratory distress.     Breath sounds: Wheezing and rhonchi present. No rales.     Comments: Adventitious lung sounds diffusely (R > L) Musculoskeletal:     Right lower leg: No edema.     Left lower leg: No edema.  Skin:    General: Skin is warm.     Capillary Refill: Capillary refill takes less than 2 seconds.     Coloration: Skin is not jaundiced or pale.     Findings: No rash.  Neurological:     General: No focal deficit present.     Mental Status: She is alert and oriented to person, place, and time.      UC Treatments / Results  Labs (all labs ordered are listed, but only abnormal results are displayed) Labs Reviewed  NOVEL CORONAVIRUS, NAA    EKG   Radiology Dg Chest 2 View  Result Date: 06/04/2019 CLINICAL DATA:  61 I am female with lung nodule. Patient complaining of cough and postnasal drip. EXAM: CHEST - 2 VIEW COMPARISON:  Chest radiograph dated 03/08/2019. FINDINGS: Mild diffuse chronic bronchitic changes. No focal consolidation, pleural effusion, pneumothorax. The cardiac silhouette is within normal limits. No acute osseous pathology. IMPRESSION: No active cardiopulmonary disease. Electronically Signed   By: Anner Crete M.D.   On: 06/04/2019 16:55    Procedures Procedures (including critical care time)  Medications Ordered in UC Medications - No data to display  Initial  Impression / Assessment and Plan / UC Course  I have reviewed the triage vital signs and the nursing notes.  Pertinent labs & imaging results that were available during my care of the patient were reviewed by me and considered in my medical decision making (see chart for details).     Patient nontoxic in office, fever control with Tylenol.  CXR obtained in office, reviewed by me and radiology (compared to previous from 03/08/2019): No focal consolidation, pleural effusion, pneumothorax.  Mild diffuse chronic bronchitic changes.  Will treat for bronchitis with prednisone, albuterol as needed.  Patient does now note that she has nebulizer at home which she has not been using: We will do so.  Educated patient not to use MDI in conjunction with nebulizer due to dual therapy.  Patient verbalized understanding.  Covid testing pending: Patient quarantine in the interim.  Return precautions discussed, patient verbalized understanding and is agreeable to plan. Final Clinical Impressions(s) / UC Diagnoses   Final diagnoses:  Bronchitis  Cough     Discharge Instructions     Take steroid once daily with breakfast. Use albuterol inhaler as needed for difficulty breathing, chest tightness, shortness of breath.  Your COVID test is pending - it is important to quarantine / isolate at home until your results are back. If you test positive and would like further evaluation for persistent or worsening symptoms, you may schedule an E-visit or virtual (video) visit throughout the [facility] app or website.  PLEASE NOTE: If you develop severe chest pain or shortness of breath please go to  the ER or call 9-1-1 for further evaluation --> DO NOT schedule electronic or virtual visits for this. Please call our office for further guidance / recommendations as needed.    ED Prescriptions    Medication Sig Dispense Auth. Provider   albuterol (PROVENTIL) (2.5 MG/3ML) 0.083% nebulizer solution Take 3 mLs (2.5  mg total) by nebulization every 6 (six) hours as needed for wheezing or shortness of breath. 75 mL Hall-Potvin, [place], PA-C   predniSONE (DELTASONE) 20 MG tablet Take 2 tablets (40 mg total) by mouth daily for 5 days. 10 tablet Hall-Potvin, [place], PA-C     PDMP not reviewed this encounter.   Hall-Potvin, [place], [place] 06/04/19 1709

## 2019-06-04 NOTE — ED Notes (Signed)
Patient able to ambulate independently  

## 2019-06-04 NOTE — Discharge Instructions (Addendum)
Take steroid once daily with breakfast. Use albuterol inhaler as needed for difficulty breathing, chest tightness, shortness of breath.  Your COVID test is pending - it is important to quarantine / isolate at home until your results are back. If you test positive and would like further evaluation for persistent or worsening symptoms, you may schedule an E-visit or virtual (video) visit throughout the [facility] app or website.  PLEASE NOTE: If you develop severe chest pain or shortness of breath please go to the ER or call 9-1-1 for further evaluation --> DO NOT schedule electronic or virtual visits for this. Please call our office for further guidance / recommendations as needed.

## 2019-06-06 LAB — NOVEL CORONAVIRUS, NAA: SARS-CoV-2, NAA: NOT DETECTED

## 2019-06-12 ENCOUNTER — Encounter: Payer: Self-pay | Admitting: Internal Medicine

## 2019-06-12 ENCOUNTER — Other Ambulatory Visit: Payer: Self-pay

## 2019-06-12 ENCOUNTER — Ambulatory Visit (INDEPENDENT_AMBULATORY_CARE_PROVIDER_SITE_OTHER): Payer: Medicare Other | Admitting: Internal Medicine

## 2019-06-12 DIAGNOSIS — J40 Bronchitis, not specified as acute or chronic: Secondary | ICD-10-CM | POA: Diagnosis not present

## 2019-06-12 MED ORDER — BENZONATATE 200 MG PO CAPS: 200.0000 mg | ORAL_CAPSULE | Freq: Three times a day (TID) | ORAL | 0 refills | Status: DC | PRN

## 2019-06-12 MED ORDER — AMOXICILLIN-POT CLAVULANATE 875-125 MG PO TABS: 1.0000 | ORAL_TABLET | Freq: Two times a day (BID) | ORAL | 0 refills | Status: DC

## 2019-06-12 NOTE — Assessment & Plan Note (Signed)
Rx augmentin 10 days and tessalon perles. Rx for supplies for nebulizer machine so she can use this. She has enough albuterol medication for this.

## 2019-06-12 NOTE — Progress Notes (Signed)
Virtual Visit via Video Note  I connected with Amy Williamson on 06/12/19 at 10:20 AM EST by a video enabled telemedicine application and verified that I am speaking with the correct person using two identifiers.  The patient and the provider were at separate locations throughout the entire encounter.   I discussed the limitations of evaluation and management by telemedicine and the availability of in person appointments. The patient expressed understanding and agreed to proceed. The patient and the provider were the only parties present for the visit unless noted in HPI below.  History of Present Illness: The patient is a 68 y.o. female with visit for urgent care follow up. Started feeling poorly about 3 weeks or so ago. Is having sinus pressure and pain. Having drainage and in her chest. She is coughing up yellow phlegm. Still having fevers to 100.6 F most days. Is having SOB mostly with talking a lot or walking. Has nebulizer but needs new supplies for this as her is not working well. She is getting temporary relief of SOB with this. She did take prednisone as prescribed by urgent care. Did not feel that this helped at all. CXR and covid-19 negative for changes. Has nothing for cough but using otc which is not helping. Overall it is not improving at all and mildly worsening. Has tried as above  Observations/Objective: Appearance: normal, breathing appears normal, some coughing during visit, no dyspnea during visit, casual grooming, abdomen does not appear distended, throat not well visualized, memory normal, mental status is A and O times 3  Assessment and Plan: See problem oriented charting  Follow Up Instructions: nebulizer supplies rx, augmentin and tessalon perles  I discussed the assessment and treatment plan with the patient. The patient was provided an opportunity to ask questions and all were answered. The patient agreed with the plan and demonstrated an understanding of the  instructions.   The patient was advised to call back or seek an in-person evaluation if the symptoms worsen or if the condition fails to improve as anticipated.  Hoyt Koch, MD

## 2019-10-16 ENCOUNTER — Other Ambulatory Visit: Payer: Self-pay | Admitting: Internal Medicine

## 2019-10-20 ENCOUNTER — Other Ambulatory Visit: Payer: Self-pay | Admitting: Internal Medicine

## 2019-10-21 ENCOUNTER — Ambulatory Visit: Payer: Medicare Other | Admitting: Internal Medicine

## 2019-10-22 ENCOUNTER — Ambulatory Visit: Payer: Medicare Other | Admitting: Internal Medicine

## 2019-10-30 ENCOUNTER — Other Ambulatory Visit: Payer: Self-pay

## 2019-10-30 ENCOUNTER — Ambulatory Visit: Payer: Medicare Other | Admitting: Internal Medicine

## 2019-10-30 ENCOUNTER — Encounter: Payer: Self-pay | Admitting: Internal Medicine

## 2019-10-30 VITALS — BP 152/76 | HR 74 | Temp 98.6°F | Ht 60.0 in | Wt 214.0 lb

## 2019-10-30 DIAGNOSIS — E785 Hyperlipidemia, unspecified: Secondary | ICD-10-CM

## 2019-10-30 LAB — LIPID PANEL
Cholesterol: 179 mg/dL (ref 0–200)
HDL: 56.5 mg/dL (ref >39.00)
LDL Cholesterol: 101 mg/dL — ABNORMAL HIGH (ref 0–99)
NonHDL: 122.22
Total CHOL/HDL Ratio: 3
Triglycerides: 107 mg/dL (ref 0.0–149.0)
VLDL: 21.4 mg/dL (ref 0.0–40.0)

## 2019-10-30 NOTE — Progress Notes (Signed)
   Subjective:   Patient ID: Amy Williamson, female    DOB: [birth year], 69 y.o.   MRN: [id number]  HPI The patient is a 69 YO female coming in for follow up cholesterol medicine. She did start simvastatin about 6 months ago. Denies side effects. Doing well overall. Denies chest pains or stroke symptoms. Denies muscle aches. Having some sciatica issues and seeing orthopedic on Monday. Tried Restaurant manager, fast food and that did not work.   Review of Systems  Constitutional: Negative.   HENT: Negative.   Eyes: Negative.   Respiratory: Negative for cough, chest tightness and shortness of breath.   Cardiovascular: Negative for chest pain, palpitations and leg swelling.  Gastrointestinal: Negative for abdominal distention, abdominal pain, constipation, diarrhea, nausea and vomiting.  Musculoskeletal: Positive for back pain.  Skin: Negative.   Neurological: Negative.   Psychiatric/Behavioral: Negative.     Objective:  Physical Exam Constitutional:      Appearance: She is well-developed. She is obese.  HENT:     Head: Normocephalic and atraumatic.  Cardiovascular:     Rate and Rhythm: Normal rate and regular rhythm.  Pulmonary:     Effort: Pulmonary effort is normal. No respiratory distress.     Breath sounds: Normal breath sounds. No wheezing or rales.  Abdominal:     General: Bowel sounds are normal. There is no distension.     Palpations: Abdomen is soft.     Tenderness: There is no abdominal tenderness. There is no rebound.  Musculoskeletal:     Cervical back: Normal range of motion.  Skin:    General: Skin is warm and dry.  Neurological:     Mental Status: She is alert and oriented to person, place, and time.     Coordination: Coordination normal.     Vitals:   10/30/19 0909  BP: (!) 152/76  Pulse: 74  Temp: 98.6 F (37 C)  TempSrc: Oral  SpO2: 98%  Weight: 214 lb (97.1 kg)  Height: 5' (1.524 m)    This visit occurred during the SARS-CoV-2 public health emergency.  Safety  protocols were in place, including screening questions prior to the visit, additional usage of staff PPE, and extensive cleaning of exam room while observing appropriate contact time as indicated for disinfecting solutions.   Assessment & Plan:

## 2019-10-30 NOTE — Patient Instructions (Signed)
We will check the labs today. 

## 2019-10-30 NOTE — Assessment & Plan Note (Signed)
Started simvastatin about 6 months ago. Checking lipid panel today and adjust as needed.

## 2020-03-21 ENCOUNTER — Other Ambulatory Visit: Payer: Self-pay | Admitting: Obstetrics & Gynecology

## 2020-03-21 DIAGNOSIS — Z1231 Encounter for screening mammogram for malignant neoplasm of breast: Secondary | ICD-10-CM

## 2020-04-12 ENCOUNTER — Other Ambulatory Visit: Payer: Self-pay

## 2020-04-12 ENCOUNTER — Ambulatory Visit
Admission: RE | Admit: 2020-04-12 | Discharge: 2020-04-12 | Disposition: A | Discharge status: Home / Self Care | Payer: Medicare Other | Source: Ambulatory Visit | Attending: Obstetrics & Gynecology | Admitting: Obstetrics & Gynecology

## 2020-04-12 DIAGNOSIS — Z1231 Encounter for screening mammogram for malignant neoplasm of breast: Secondary | ICD-10-CM

## 2020-04-14 ENCOUNTER — Other Ambulatory Visit: Payer: Self-pay | Admitting: Obstetrics & Gynecology

## 2020-04-14 DIAGNOSIS — R928 Other abnormal and inconclusive findings on diagnostic imaging of breast: Secondary | ICD-10-CM

## 2020-04-19 ENCOUNTER — Other Ambulatory Visit: Payer: Self-pay | Admitting: Obstetrics & Gynecology

## 2020-04-19 ENCOUNTER — Other Ambulatory Visit: Payer: Self-pay

## 2020-04-19 ENCOUNTER — Ambulatory Visit
Admission: RE | Admit: 2020-04-19 | Discharge: 2020-04-19 | Disposition: A | Discharge status: Home / Self Care | Payer: Medicare Other | Source: Ambulatory Visit | Attending: Obstetrics & Gynecology | Admitting: Obstetrics & Gynecology

## 2020-04-19 DIAGNOSIS — R928 Other abnormal and inconclusive findings on diagnostic imaging of breast: Secondary | ICD-10-CM

## 2020-04-24 ENCOUNTER — Other Ambulatory Visit: Payer: Self-pay | Admitting: Internal Medicine

## 2020-04-25 ENCOUNTER — Ambulatory Visit
Admission: RE | Admit: 2020-04-25 | Discharge: 2020-04-25 | Disposition: A | Discharge status: Home / Self Care | Payer: Medicare Other | Source: Ambulatory Visit | Attending: Obstetrics & Gynecology | Admitting: Obstetrics & Gynecology

## 2020-04-25 ENCOUNTER — Other Ambulatory Visit: Payer: Self-pay

## 2020-04-25 DIAGNOSIS — R928 Other abnormal and inconclusive findings on diagnostic imaging of breast: Secondary | ICD-10-CM

## 2020-05-02 ENCOUNTER — Other Ambulatory Visit: Payer: Self-pay | Admitting: Surgery

## 2020-05-02 DIAGNOSIS — N6489 Other specified disorders of breast: Secondary | ICD-10-CM

## 2020-05-10 NOTE — Progress Notes (Signed)
69 y.o. G2P2 Married White or Caucasian female here for breast and pelvic exam.  Pt had recent 04/25/2020 breast biopsy showing complex sclerosing lesion.  Has seen Dr. Rush Farmer.  Lumpectomy is scheduled for 11/18.  She has some questions about this.  Answered all of these to the best of my ability.  Having a lot of anxiety about this and just feels "torn up inside".  Had prescription of Xanac from PCP in the past.  Would like small prescription today if possible.  Risk of addiction discussed.  Having more issues with urinary urgency and some incontinence.  Ready for treatment.  Denies vaginal bleeding.  Patient's last menstrual period was 07/23/2004.          Sexually active: No.  H/O STD:  no  Health Maintenance: PCP:  Pricilla Holm  Last wellness appt was last year  Did blood work at that appt: yes.  Has appt June 30, 2020.   Vaccines are up to date:  yes Colonoscopy:  03-12-12 f/u 42yrs MMG:  See reports, surgery scheduled for 06-09-2020 BMD:  12-08-15 neg Last pap smear:  08-04-15 ASCUS HPV HR neg 02-20-18 neg H/o abnormal pap smear:  yes    reports that she has never smoked. She has never used smokeless tobacco. She reports that she does not drink alcohol and does not use drugs.  Past Medical History:  Diagnosis Date  . Arthritis    BACK AND SHOULDERS  . Bronchitis 07-14-11  . Cataract immature BILATERAL  . Depressive disorder, not elsewhere classified   . Dysphagia   . History of esophageal reflux   . History of migraine headaches   . History of pneumonia 05-14-11  . Obesity, unspecified   . Palpitations   . Plantar fasciitis 08/2017   left foot   . Pure hypercholesterolemia   . Unspecified essential hypertension   . Unspecified venous (peripheral) insufficiency     Past Surgical History:  Procedure Laterality Date  . CARPAL TUNNEL RELEASE Right 2004  . CARPAL TUNNEL RELEASE Left 2006  . CESAREAN SECTION  [id number]  . EAR CYST EXCISION  08/03/2011   patient  states this is incorrect  . KNEE ARTHROSCOPY    . KNEE ARTHROSCOPY W/ MENISCAL REPAIR  08/03/2011   with popliteal cyst excition  . KNEE ARTHROSCOPY WITH EXCISION BAKER'S CYST    . PULLEY RELEASE RIGHT TRIGGER FINGER Right 01/08/2011  . TRIGGER FINGER RELEASE Right 04/20/13  . TRIGGER FINGER RELEASE Left 05/06/14  . TUBAL LIGATION  1977   with second cesarean section    Current Outpatient Medications  Medication Sig Dispense Refill  . albuterol (PROVENTIL) (2.5 MG/3ML) 0.083% nebulizer solution Take 3 mLs (2.5 mg total) by nebulization every 6 (six) hours as needed for wheezing or shortness of breath. 75 mL 12  . amLODipine (NORVASC) 5 MG tablet TAKE 1 TABLET BY MOUTH EVERY DAY 90 tablet 2  . diphenhydramine-acetaminophen (TYLENOL PM) 25-500 MG TABS tablet Take 1 tablet by mouth at bedtime as needed (sleep).     . fluticasone (FLONASE) 50 MCG/ACT nasal spray Place 1 spray into both nostrils daily. 16 g 0  . Ibuprofen 200 MG CAPS Take 800 mg by mouth 2 (two) times daily as needed (headache and fever).     [name] omeprazole (PRILOSEC) 20 MG capsule TAKE 1 CAPSULE BY MOUTH EVERY DAY 90 capsule 3  . simvastatin (ZOCOR) 20 MG tablet TAKE 1 TABLET BY MOUTH EVERYDAY AT BEDTIME 90 tablet 1   No current facility-administered  medications for this visit.    Family History  Problem Relation Age of Onset  . Heart failure Mother   . Osteoporosis Mother   . Hyperlipidemia Mother   . Hypertension Mother   . Heart disease Father   . Clotting disorder Father   . Liver cancer Brother   . Liver disease Maternal Aunt        x2  . Colon cancer Neg Hx     Review of Systems  Constitutional: Negative.   HENT: Negative.   Eyes: Negative.   Respiratory: Negative.   Cardiovascular: Negative.   Gastrointestinal: Negative.   Endocrine: Negative.   Genitourinary: Negative.   Musculoskeletal: Negative.   Skin: Negative.   Allergic/Immunologic: Negative.   Neurological: Negative.   Hematological:  Negative.   Psychiatric/Behavioral: Negative.     Exam:   Ht 5' 0.25" (1.53 m)   Wt 215 lb (97.5 kg)   LMP 07/23/2004   BMI 41.64 kg/m   Height: 5' 0.25" (153 cm)  General appearance: alert, cooperative and appears stated age Breasts: normal appearance, no masses or tenderness Abdomen: soft, non-tender; bowel sounds normal; no masses,  no organomegaly Lymph nodes: Cervical, supraclavicular, and axillary nodes normal.  No abnormal inguinal nodes palpated Neurologic: Grossly normal  Pelvic: External genitalia:  no lesions              Urethra:  normal appearing urethra with no masses, tenderness or lesions              Bartholins and Skenes: normal                 Vagina: normal appearing vagina with normal color and discharge, no lesions              Cervix: no lesions              Pap taken: No. Bimanual Exam:  Uterus:  normal size, contour, position, consistency, mobility, non-tender              Adnexa: normal adnexa and no mass, fullness, tenderness               Rectovaginal: Confirms               Anus:  normal sphincter tone, no lesions  Chaperone, Olene Floss, CMA, was present for exam.  A:  Breast and Pelvic exam PMP, no HRT Recent biopsy showing complex sclerosing lesion, lumpectomy is scheduled H/o c section x 2 Hypertension, elevated lipids, GERD, seasonal allergies managed by Dr. Sharlet Salina OAB Anxiety  P:   Mammogram guidelines reviewed.  She and I discussed the possibility of breast MRI as well depending on final pathology from lumpectomy. pap smear not indicated.  She and I discussed guidelines and we both feel comfortable stopping pap smear testing Colonoscopy due 2023 BMD done 2017.  Plan to repeat with mammogram next year. Myrbetriq 25mg  daily.  BP risk discussed.  She will check and call with results. Xanax 0.5mg  1/2 to 1 tab no more than daily prn anxiety.  #30/0RF. return annually or prn  31 minutes of total time was spent for this patient encounter,  including preparation, face-to-face counseling with the patient and coordination of care, and documentation of the encounter.

## 2020-05-11 ENCOUNTER — Other Ambulatory Visit: Payer: Self-pay | Admitting: Surgery

## 2020-05-11 DIAGNOSIS — N6489 Other specified disorders of breast: Secondary | ICD-10-CM

## 2020-05-12 ENCOUNTER — Encounter: Payer: Medicare Other | Admitting: Internal Medicine

## 2020-05-13 ENCOUNTER — Other Ambulatory Visit: Payer: Self-pay

## 2020-05-13 ENCOUNTER — Ambulatory Visit (INDEPENDENT_AMBULATORY_CARE_PROVIDER_SITE_OTHER): Payer: Medicare Other | Admitting: Obstetrics & Gynecology

## 2020-05-13 ENCOUNTER — Encounter: Payer: Self-pay | Admitting: Obstetrics & Gynecology

## 2020-05-13 VITALS — BP 118/74 | HR 68 | Resp 16 | Ht 60.25 in | Wt 215.0 lb

## 2020-05-13 DIAGNOSIS — N6489 Other specified disorders of breast: Secondary | ICD-10-CM | POA: Diagnosis not present

## 2020-05-13 DIAGNOSIS — Z01419 Encounter for gynecological examination (general) (routine) without abnormal findings: Secondary | ICD-10-CM

## 2020-05-13 MED ORDER — ALPRAZOLAM 0.5 MG PO TABS: ORAL_TABLET | ORAL | 0 refills | Status: DC

## 2020-05-13 MED ORDER — MIRABEGRON ER 25 MG PO TB24: 25.0000 mg | ORAL_TABLET | Freq: Every day | ORAL | 2 refills | Status: DC

## 2020-05-13 NOTE — Patient Instructions (Signed)
Please give me a call in about 10 days and let me know the results.

## 2020-05-23 ENCOUNTER — Encounter: Payer: Self-pay | Admitting: Obstetrics & Gynecology

## 2020-05-24 ENCOUNTER — Other Ambulatory Visit: Payer: Self-pay | Admitting: Obstetrics & Gynecology

## 2020-05-24 ENCOUNTER — Telehealth: Payer: Self-pay

## 2020-05-24 NOTE — Telephone Encounter (Signed)
Routing to Dr Sabra Heck, please advise

## 2020-05-24 NOTE — Telephone Encounter (Signed)
Pt sent following mychart message:  Amy, Williamson [facility] My blood pressure readings for past 10 days:   1) 05/14/2000  148/69  2) 05/15/2020  135/65  3) 05/16/2020 147/69  4) 05/17/2020 139/71  5) 05/19/2020 139/64  6) 05/20/2020 145/66  7) 05/21/2020 141/64  8) 05/23/2020  140/68   I have seen some change in the frequency of the number of times I have to go urinate during the day.  I'm still having to get up several times at night to go to bathroom. Do I just continue taking the medication as prescribed?   Thanks.  Amy Williamson

## 2020-05-24 NOTE — Telephone Encounter (Signed)
The 25mg  myrbetriq is increasing her blood pressure so we should stop this.  There is a 14% increase risk of elevated BP and 10% risk with the 50mg  dosage so I think we should consider a medication change.  I would suggest trying oxybutynin XL 5mg  daily.  This can cause dry mouth, constipation and somnolence but the max dosage is 30mg  so I would start low and work up slowly.  Would be consider switching.

## 2020-05-25 MED ORDER — OXYBUTYNIN CHLORIDE ER 5 MG PO TB24: 5.0000 mg | ORAL_TABLET | Freq: Every day | ORAL | 0 refills | Status: DC

## 2020-05-25 NOTE — Telephone Encounter (Signed)
Spoke with pt. Pt given update and recommendations per Dr Sabra Heck. Pt agreeable to switch to oxybutynin XL 5 mg daily. Pt verbalized understanding of side effects. Pt advised to give update to Dr Sabra Heck in 1 week to see if needs to increase dose.  Rx oxybutynin 5mg  XL # 30, 0RF sent to pharmacy on file.  Routing to Dr Sabra Heck for update   Does pt need to check BP with this medication?

## 2020-05-26 NOTE — Telephone Encounter (Signed)
Pt aware of no BP needed on this medication.  Encounter closed

## 2020-05-26 NOTE — Telephone Encounter (Signed)
This medication does not causes elevations in blood pressure.  Dry eyes, dry mouth, constipation, somnolence are possible side effects.  That is why I'm starting with the 5mg  dosage.  Thanks.

## 2020-06-02 ENCOUNTER — Encounter (HOSPITAL_BASED_OUTPATIENT_CLINIC_OR_DEPARTMENT_OTHER): Payer: Self-pay | Admitting: Surgery

## 2020-06-02 ENCOUNTER — Other Ambulatory Visit: Payer: Self-pay

## 2020-06-03 ENCOUNTER — Encounter: Payer: Self-pay | Admitting: Obstetrics & Gynecology

## 2020-06-06 ENCOUNTER — Other Ambulatory Visit (HOSPITAL_COMMUNITY)
Admission: RE | Admit: 2020-06-06 | Discharge: 2020-06-06 | Disposition: A | Discharge status: Home / Self Care | Payer: Medicare Other | Source: Ambulatory Visit | Attending: Surgery | Admitting: Surgery

## 2020-06-06 ENCOUNTER — Encounter (HOSPITAL_BASED_OUTPATIENT_CLINIC_OR_DEPARTMENT_OTHER)
Admission: RE | Admit: 2020-06-06 | Discharge: 2020-06-06 | Disposition: A | Discharge status: Home / Self Care | Payer: Medicare Other | Source: Ambulatory Visit | Attending: Surgery | Admitting: Surgery

## 2020-06-06 DIAGNOSIS — Z01812 Encounter for preprocedural laboratory examination: Secondary | ICD-10-CM | POA: Diagnosis present

## 2020-06-06 DIAGNOSIS — I10 Essential (primary) hypertension: Secondary | ICD-10-CM | POA: Insufficient documentation

## 2020-06-06 DIAGNOSIS — Z20822 Contact with and (suspected) exposure to covid-19: Secondary | ICD-10-CM | POA: Insufficient documentation

## 2020-06-06 DIAGNOSIS — Z01810 Encounter for preprocedural cardiovascular examination: Secondary | ICD-10-CM | POA: Insufficient documentation

## 2020-06-06 LAB — SARS CORONAVIRUS 2 (TAT 6-24 HRS): SARS Coronavirus 2: NEGATIVE

## 2020-06-06 NOTE — Progress Notes (Signed)

## 2020-06-07 ENCOUNTER — Telehealth: Payer: Self-pay

## 2020-06-07 NOTE — Telephone Encounter (Signed)
AEX 05/13/2020 with SM H/o OAB   Pt changed to oxybutynin 5mg  XL in 11/2 and sent follow up through [place] message.   Does pt need to continue current dose or increase?   Routing to Dr Quincy Simmonds for review, Please advise

## 2020-06-07 NOTE — Telephone Encounter (Signed)
Pt sent following mychart message:  [name] [name] I'm tolerating the medication very well. I'm not having any side effects to the medication. I'm having less frequency in urination during the day. Most nights I have to get up 2 times during the night. Do I continue taking this medicine as prescribed? I don't have anymore refills.   Thanks  Amy Williamson

## 2020-06-08 ENCOUNTER — Ambulatory Visit
Admission: RE | Admit: 2020-06-08 | Discharge: 2020-06-08 | Disposition: A | Discharge status: Home / Self Care | Payer: Medicare Other | Source: Ambulatory Visit | Attending: Surgery | Admitting: Surgery

## 2020-06-08 ENCOUNTER — Other Ambulatory Visit: Payer: Self-pay

## 2020-06-08 DIAGNOSIS — N6489 Other specified disorders of breast: Secondary | ICD-10-CM

## 2020-06-08 MED ORDER — OXYBUTYNIN CHLORIDE ER 10 MG PO TB24: 10.0000 mg | ORAL_TABLET | Freq: Every day | ORAL | 1 refills | Status: DC

## 2020-06-08 NOTE — H&P (Signed)
Amy Williamson  Location: [facility] Patient #: [id number] DOB: 08/15/1950 Married / Language: English / Race: White Female   History of Present Illness The patient is a 69 year old female who presents with a complaint of Breast problems.  Chief complaint: Complex sclerosing lesion left breast  This is a 69 year old female who had undergone a recent screening mammography. She is found to have a 7 mm abnormality at the 2 o'clock position of the left breast. She underwent a biopsy of this showing a complex sclerosing lesion. A lumpectomy of this area was recommended. She has no previous history of breast cancer. She has had no previous surgery on her breast. There is no family history of breast cancer. She is otherwise without complaints.   Past Surgical History  Breast Biopsy  Left. Cataract Surgery  Bilateral. Cesarean Section - Multiple   Diagnostic Studies History Colonoscopy  1-5 years ago Mammogram  within last year Pap Smear  1-5 years ago  Allergies Atenolol *BETA BLOCKERS*  Avelox *FLUOROQUINOLONES*  Azithromycin *CHEMICALS*  Doxycycline *DERMATOLOGICALS*  Allergies Reconciled   Medication History amLODIPine Besylate (5MG  Tablet, Oral) Active. Simvastatin (20MG  Tablet, Oral) Active. Omeprazole (20MG  Capsule DR, Oral) Active. Tylenol (Oral) Specific strength unknown - Active. Medications Reconciled  Social History Caffeine use  Coffee, Tea. No alcohol use  No drug use  Tobacco use  Never smoker.  Family History Arthritis  Mother, Sister. Cancer  Brother. Cerebrovascular Accident  Family Members In General. Depression  Mother. Heart disease in female family member before age 40  Hypertension  Father, Mother.  Pregnancy / Birth History  Age at menarche  40 years. Age of menopause  [age]  Other Problems Anxiety Disorder  Bladder Problems  Gastroesophageal Reflux Disease  Hemorrhoids  High blood  pressure     Review of Systems  General Present- Fatigue and Weight Gain. Not Present- Appetite Loss, Chills, Fever, Night Sweats and Weight Loss. Skin Not Present- Change in Wart/Mole, Dryness, Hives, Jaundice, New Lesions, Non-Healing Wounds, Rash and Ulcer. HEENT Present- Hearing Loss. Not Present- Earache, Hoarseness, Nose Bleed, Oral Ulcers, Ringing in the Ears, Seasonal Allergies, Sinus Pain, Sore Throat, Visual Disturbances, Wears glasses/contact lenses and Yellow Eyes. Respiratory Not Present- Bloody sputum, Chronic Cough, Difficulty Breathing, Snoring and Wheezing. Breast Present- Breast Mass. Not Present- Breast Pain, Nipple Discharge and Skin Changes. Gastrointestinal Not Present- Abdominal Pain, Bloating, Bloody Stool, Change in Bowel Habits, Chronic diarrhea, Constipation, Difficulty Swallowing, Excessive gas, Gets full quickly at meals, Hemorrhoids, Indigestion, Nausea, Rectal Pain and Vomiting. Female Genitourinary Present- Frequency and Nocturia. Not Present- Painful Urination, Pelvic Pain and Urgency. Musculoskeletal Present- Back Pain and Joint Stiffness. Not Present- Joint Pain, Muscle Pain, Muscle Weakness and Swelling of Extremities. Endocrine Present- Cold Intolerance. Not Present- Excessive Hunger, Hair Changes, Heat Intolerance, Hot flashes and New Diabetes.  Vitals  Weight: 217 lb Height: 60in Body Surface Area: 1.93 m Body Mass Index: 42.38 kg/m  Temp.: 97.66F  Pulse: 97 (Regular)      Physical Exam  The physical exam findings are as follows: Note: She appears well exam.  There is no palpable mass in left breast. There is mild ecchymosis from biopsy. The nipple areolar complex is normal. There is no axillary adenopathy.    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 I reviewed the pathology results as well as the ultrasound and mammogram.  She has a left  breast complex sclerosing lesion. I gave her a copy of the pathology results we discussed the diagnosis with her husband in detail. We discussed the reasons for proceeding with surgery to remove this area for histological evaluation for malignancy. This would be with a radioactive seed left breast lumpectomy. I discussed the surgical procedure within the detail. I discussed the risk which includes but is not limited to bleeding, infection, and the need for further surgery malignancy is found. They understand and wish to proceed with surgery which will be scheduled.

## 2020-06-08 NOTE — Telephone Encounter (Signed)
Message left to return call to Amy Williamson at [phone].    

## 2020-06-08 NOTE — Telephone Encounter (Signed)
Ok to increase to Ditropan XL 10 mg po q hs.  Disp:  30 RF:  one.   Please have her contact the office to let us know how she is doing on the increased dosage.  This may cause dry mouth or constipation.

## 2020-06-08 NOTE — Telephone Encounter (Signed)
Patient returned call

## 2020-06-08 NOTE — Telephone Encounter (Signed)
Returned call to patient. Message given to patient as seen below from Dr. Quincy Simmonds and patient verbalized understanding. Patient requesting refill of Ditropan to [facility] on [address]. Patient advised to contact office with update after starting on increased dosage of Ditropan. Patient agreeable.   Ditropan 10mg  XL #30, 1RF sent to confirmed pharmacy.   Routing to provider and will close encounter.

## 2020-06-09 ENCOUNTER — Ambulatory Visit (HOSPITAL_BASED_OUTPATIENT_CLINIC_OR_DEPARTMENT_OTHER)
Admission: RE | Admit: 2020-06-09 | Discharge: 2020-06-09 | Disposition: A | Discharge status: Home / Self Care | Payer: Medicare Other | Attending: Surgery | Admitting: Surgery

## 2020-06-09 ENCOUNTER — Encounter (HOSPITAL_BASED_OUTPATIENT_CLINIC_OR_DEPARTMENT_OTHER): Payer: Self-pay | Admitting: Surgery

## 2020-06-09 ENCOUNTER — Ambulatory Visit (HOSPITAL_BASED_OUTPATIENT_CLINIC_OR_DEPARTMENT_OTHER): Payer: Medicare Other | Admitting: Anesthesiology

## 2020-06-09 ENCOUNTER — Encounter (HOSPITAL_BASED_OUTPATIENT_CLINIC_OR_DEPARTMENT_OTHER)
Admission: RE | Disposition: A | Discharge status: Home / Self Care | Payer: Self-pay | Source: Home / Self Care | Attending: Surgery

## 2020-06-09 ENCOUNTER — Ambulatory Visit
Admission: RE | Admit: 2020-06-09 | Discharge: 2020-06-09 | Disposition: A | Discharge status: Home / Self Care | Payer: Medicare Other | Source: Ambulatory Visit | Attending: Surgery | Admitting: Surgery

## 2020-06-09 ENCOUNTER — Other Ambulatory Visit: Payer: Self-pay

## 2020-06-09 DIAGNOSIS — N6489 Other specified disorders of breast: Secondary | ICD-10-CM | POA: Diagnosis present

## 2020-06-09 DIAGNOSIS — Z800 Family history of malignant neoplasm of digestive organs: Secondary | ICD-10-CM | POA: Insufficient documentation

## 2020-06-09 DIAGNOSIS — N6032 Fibrosclerosis of left breast: Secondary | ICD-10-CM | POA: Diagnosis not present

## 2020-06-09 DIAGNOSIS — Z888 Allergy status to other drugs, medicaments and biological substances status: Secondary | ICD-10-CM | POA: Insufficient documentation

## 2020-06-09 DIAGNOSIS — Z881 Allergy status to other antibiotic agents status: Secondary | ICD-10-CM | POA: Insufficient documentation

## 2020-06-09 DIAGNOSIS — D242 Benign neoplasm of left breast: Secondary | ICD-10-CM | POA: Insufficient documentation

## 2020-06-09 HISTORY — PX: BREAST LUMPECTOMY WITH RADIOACTIVE SEED LOCALIZATION: SHX6424

## 2020-06-09 SURGERY — BREAST LUMPECTOMY WITH RADIOACTIVE SEED LOCALIZATION
Anesthesia: General | Site: Breast | Laterality: Left

## 2020-06-09 MED ORDER — ONDANSETRON HCL 4 MG/2ML IJ SOLN
INTRAMUSCULAR | Status: DC | PRN
  Administered 2020-06-09: 4 mg via INTRAVENOUS

## 2020-06-09 MED ORDER — PROPOFOL 10 MG/ML IV BOLUS
INTRAVENOUS | Status: AC
  Filled 2020-06-09: qty 20

## 2020-06-09 MED ORDER — PROPOFOL 10 MG/ML IV BOLUS
INTRAVENOUS | Status: DC | PRN
  Administered 2020-06-09: 30 mg via INTRAVENOUS
  Administered 2020-06-09: 200 mg via INTRAVENOUS

## 2020-06-09 MED ORDER — LACTATED RINGERS IV SOLN: INTRAVENOUS | Status: DC

## 2020-06-09 MED ORDER — CEFAZOLIN SODIUM-DEXTROSE 2-4 GM/100ML-% IV SOLN
INTRAVENOUS | Status: AC
  Filled 2020-06-09: qty 100

## 2020-06-09 MED ORDER — OXYCODONE HCL 5 MG/5ML PO SOLN: 5.0000 mg | Freq: Once | ORAL | Status: DC | PRN

## 2020-06-09 MED ORDER — EPHEDRINE SULFATE 50 MG/ML IJ SOLN
INTRAMUSCULAR | Status: DC | PRN
  Administered 2020-06-09: 5 mg via INTRAVENOUS
  Administered 2020-06-09: 10 mg via INTRAVENOUS

## 2020-06-09 MED ORDER — FENTANYL CITRATE (PF) 250 MCG/5ML IJ SOLN
INTRAMUSCULAR | Status: DC | PRN
  Administered 2020-06-09: 50 ug via INTRAVENOUS

## 2020-06-09 MED ORDER — PROMETHAZINE HCL 25 MG/ML IJ SOLN: 6.2500 mg | INTRAMUSCULAR | Status: DC | PRN

## 2020-06-09 MED ORDER — DEXAMETHASONE SODIUM PHOSPHATE 10 MG/ML IJ SOLN
INTRAMUSCULAR | Status: DC | PRN
  Administered 2020-06-09: 8 mg via INTRAVENOUS

## 2020-06-09 MED ORDER — TRAMADOL HCL 50 MG PO TABS
50.0000 mg | ORAL_TABLET | Freq: Four times a day (QID) | ORAL | 0 refills | Status: DC | PRN
Start: 2020-06-09 — End: 2020-07-08

## 2020-06-09 MED ORDER — CHLORHEXIDINE GLUCONATE CLOTH 2 % EX PADS: 6.0000 | MEDICATED_PAD | Freq: Once | CUTANEOUS | Status: DC

## 2020-06-09 MED ORDER — ACETAMINOPHEN 500 MG PO TABS
1000.0000 mg | ORAL_TABLET | ORAL | Status: AC
  Administered 2020-06-09: 1000 mg via ORAL

## 2020-06-09 MED ORDER — MIDAZOLAM HCL 2 MG/2ML IJ SOLN
INTRAMUSCULAR | Status: DC | PRN
  Administered 2020-06-09: 2 mg via INTRAVENOUS

## 2020-06-09 MED ORDER — ONDANSETRON HCL 4 MG/2ML IJ SOLN
INTRAMUSCULAR | Status: AC
  Filled 2020-06-09: qty 2

## 2020-06-09 MED ORDER — GABAPENTIN 300 MG PO CAPS
ORAL_CAPSULE | ORAL | Status: AC
  Filled 2020-06-09: qty 1

## 2020-06-09 MED ORDER — BUPIVACAINE HCL (PF) 0.25 % IJ SOLN
INTRAMUSCULAR | Status: DC | PRN
  Administered 2020-06-09: 10 mL

## 2020-06-09 MED ORDER — DEXAMETHASONE SODIUM PHOSPHATE 10 MG/ML IJ SOLN
INTRAMUSCULAR | Status: AC
  Filled 2020-06-09: qty 1

## 2020-06-09 MED ORDER — LIDOCAINE 2% (20 MG/ML) 5 ML SYRINGE
INTRAMUSCULAR | Status: AC
  Filled 2020-06-09: qty 5

## 2020-06-09 MED ORDER — FENTANYL CITRATE (PF) 100 MCG/2ML IJ SOLN
INTRAMUSCULAR | Status: AC
  Filled 2020-06-09: qty 2

## 2020-06-09 MED ORDER — CEFAZOLIN SODIUM-DEXTROSE 2-4 GM/100ML-% IV SOLN
2.0000 g | INTRAVENOUS | Status: AC
  Administered 2020-06-09: 2 g via INTRAVENOUS

## 2020-06-09 MED ORDER — GABAPENTIN 300 MG PO CAPS
300.0000 mg | ORAL_CAPSULE | ORAL | Status: AC
  Administered 2020-06-09: 300 mg via ORAL

## 2020-06-09 MED ORDER — EPHEDRINE 5 MG/ML INJ
INTRAVENOUS | Status: AC
  Filled 2020-06-09: qty 10

## 2020-06-09 MED ORDER — MIDAZOLAM HCL 2 MG/2ML IJ SOLN
INTRAMUSCULAR | Status: AC
  Filled 2020-06-09: qty 2

## 2020-06-09 MED ORDER — AMISULPRIDE (ANTIEMETIC) 5 MG/2ML IV SOLN: 10.0000 mg | Freq: Once | INTRAVENOUS | Status: DC | PRN

## 2020-06-09 MED ORDER — ACETAMINOPHEN 500 MG PO TABS
ORAL_TABLET | ORAL | Status: AC
  Filled 2020-06-09: qty 2

## 2020-06-09 MED ORDER — HYDROMORPHONE HCL 1 MG/ML IJ SOLN: 0.2500 mg | INTRAMUSCULAR | Status: DC | PRN

## 2020-06-09 MED ORDER — OXYCODONE HCL 5 MG PO TABS: 5.0000 mg | ORAL_TABLET | Freq: Once | ORAL | Status: DC | PRN

## 2020-06-09 MED ORDER — LIDOCAINE HCL (CARDIAC) PF 100 MG/5ML IV SOSY
PREFILLED_SYRINGE | INTRAVENOUS | Status: DC | PRN
  Administered 2020-06-09: 60 mg via INTRATRACHEAL

## 2020-06-09 SURGICAL SUPPLY — 49 items
ADH SKN CLS APL DERMABOND .7 (GAUZE/BANDAGES/DRESSINGS) ×1
APL PRP STRL LF DISP 70% ISPRP (MISCELLANEOUS) ×1
APPLIER CLIP 9.375 MED OPEN (MISCELLANEOUS)
APR CLP MED 9.3 20 MLT OPN (MISCELLANEOUS)
BINDER BREAST 3XL (GAUZE/BANDAGES/DRESSINGS) IMPLANT
BINDER BREAST LRG (GAUZE/BANDAGES/DRESSINGS) IMPLANT
BINDER BREAST MEDIUM (GAUZE/BANDAGES/DRESSINGS) IMPLANT
BINDER BREAST XLRG (GAUZE/BANDAGES/DRESSINGS) IMPLANT
BINDER BREAST XXLRG (GAUZE/BANDAGES/DRESSINGS) IMPLANT
BLADE SURG 15 STRL LF DISP TIS (BLADE) ×1 IMPLANT
BLADE SURG 15 STRL SS (BLADE) ×3
CANISTER SUC SOCK COL 7IN (MISCELLANEOUS) IMPLANT
CANISTER SUCT 1200ML W/VALVE (MISCELLANEOUS) IMPLANT
CHLORAPREP W/TINT 26 (MISCELLANEOUS) ×3 IMPLANT
CLIP APPLIE 9.375 MED OPEN (MISCELLANEOUS) IMPLANT
COVER BACK TABLE 60X90IN (DRAPES) ×3 IMPLANT
COVER MAYO STAND STRL (DRAPES) ×3 IMPLANT
COVER PROBE W GEL 5X96 (DRAPES) ×3 IMPLANT
COVER WAND RF STERILE (DRAPES) IMPLANT
DECANTER SPIKE VIAL GLASS SM (MISCELLANEOUS) IMPLANT
DERMABOND ADVANCED (GAUZE/BANDAGES/DRESSINGS) ×2
DERMABOND ADVANCED .7 DNX12 (GAUZE/BANDAGES/DRESSINGS) ×1 IMPLANT
DRAPE LAPAROSCOPIC ABDOMINAL (DRAPES) ×3 IMPLANT
DRAPE UTILITY XL STRL (DRAPES) ×3 IMPLANT
ELECT REM PT RETURN 9FT ADLT (ELECTROSURGICAL) ×3
ELECTRODE REM PT RTRN 9FT ADLT (ELECTROSURGICAL) ×1 IMPLANT
GAUZE SPONGE 4X4 12PLY STRL LF (GAUZE/BANDAGES/DRESSINGS) IMPLANT
GLOVE SURG SIGNA 7.5 PF LTX (GLOVE) ×3 IMPLANT
GOWN STRL REUS W/ TWL LRG LVL3 (GOWN DISPOSABLE) ×1 IMPLANT
GOWN STRL REUS W/ TWL XL LVL3 (GOWN DISPOSABLE) ×1 IMPLANT
GOWN STRL REUS W/TWL LRG LVL3 (GOWN DISPOSABLE) ×3
GOWN STRL REUS W/TWL XL LVL3 (GOWN DISPOSABLE) ×3
KIT MARKER MARGIN INK (KITS) ×3 IMPLANT
NEEDLE HYPO 25X1 1.5 SAFETY (NEEDLE) ×3 IMPLANT
NS IRRIG 1000ML POUR BTL (IV SOLUTION) IMPLANT
PACK BASIN DAY SURGERY FS (CUSTOM PROCEDURE TRAY) ×3 IMPLANT
PENCIL SMOKE EVACUATOR (MISCELLANEOUS) ×3 IMPLANT
SLEEVE SCD COMPRESS KNEE MED (MISCELLANEOUS) ×3 IMPLANT
SPONGE LAP 4X18 RFD (DISPOSABLE) ×3 IMPLANT
SUT MNCRL AB 4-0 PS2 18 (SUTURE) ×3 IMPLANT
SUT SILK 2 0 SH (SUTURE) IMPLANT
SUT VIC AB 3-0 SH 27 (SUTURE) ×3
SUT VIC AB 3-0 SH 27X BRD (SUTURE) ×1 IMPLANT
SYR CONTROL 10ML LL (SYRINGE) ×3 IMPLANT
TOWEL GREEN STERILE FF (TOWEL DISPOSABLE) ×3 IMPLANT
TRAY FAXITRON CT DISP (TRAY / TRAY PROCEDURE) ×3 IMPLANT
TUBE CONNECTING 20'X1/4 (TUBING)
TUBE CONNECTING 20X1/4 (TUBING) IMPLANT
YANKAUER SUCT BULB TIP NO VENT (SUCTIONS) IMPLANT

## 2020-06-09 NOTE — Anesthesia Preprocedure Evaluation (Signed)
Anesthesia Evaluation  Patient identified by MRN, date of birth, ID band Patient awake    Reviewed: Allergy & Precautions, H&P , NPO status , Patient's Chart, lab work & pertinent test results  Airway Mallampati: III  TM Distance: <3 FB Neck ROM: Full    Dental no notable dental hx.    Pulmonary neg pulmonary ROS,    Pulmonary exam normal breath sounds clear to auscultation       Cardiovascular hypertension, Normal cardiovascular exam Rhythm:Regular Rate:Normal     Neuro/Psych Depression negative neurological ROS  negative psych ROS   GI/Hepatic Neg liver ROS, GERD  ,  Endo/Other  negative endocrine ROSMorbid obesity  Renal/GU negative Renal ROS  negative genitourinary   Musculoskeletal  (+) Arthritis , Osteoarthritis,    Abdominal (+) + obese,   Peds negative pediatric ROS (+)  Hematology negative hematology ROS (+)   Anesthesia Other Findings   Reproductive/Obstetrics negative OB ROS                             Anesthesia Physical  Anesthesia Plan  ASA: III  Anesthesia Plan: General   Post-op Pain Management:    Induction: Intravenous  PONV Risk Score and Plan: 3 and Ondansetron, Dexamethasone, Midazolam and Treatment may vary due to age or medical condition  Airway Management Planned: LMA  Additional Equipment:   Intra-op Plan:   Post-operative Plan: Extubation in OR  Informed Consent: I have reviewed the patients History and Physical, chart, labs and discussed the procedure including the risks, benefits and alternatives for the proposed anesthesia with the patient or authorized representative who has indicated his/her understanding and acceptance.     Dental advisory given  Plan Discussed with: CRNA  Anesthesia Plan Comments:         Anesthesia Quick Evaluation

## 2020-06-09 NOTE — Transfer of Care (Signed)
Immediate Anesthesia Transfer of Care Note  Patient: Amy Williamson  Procedure(s) Performed: LEFT BREAST LUMPECTOMY WITH RADIOACTIVE SEED LOCALIZATION (Left Breast)  Patient Location: PACU  Anesthesia Type:General  Level of Consciousness: drowsy and patient cooperative  Airway & Oxygen Therapy: Patient Spontanous Breathing and Patient connected to face mask oxygen  Post-op Assessment: Report given to RN and Post -op Vital signs reviewed and stable  Post vital signs: Reviewed and stable  Last Vitals:  Vitals Value Taken Time  BP 123/56   Temp    Pulse 102 06/09/20 1239  Resp 15 06/09/20 1239  SpO2 100 % 06/09/20 1239  Vitals shown include unvalidated device data.  Last Pain:  Vitals:   06/09/20 1027  TempSrc: Oral  PainSc: 0-No pain      Patients Stated Pain Goal: 1 (33/29/51 8841)  Complications: No complications documented.

## 2020-06-09 NOTE — Interval H&P Note (Signed)
History and Physical Interval Note: no change in H and P  06/09/2020 10:37 AM  Amy Williamson  has presented today for surgery, with the diagnosis of COMPLEX SCLEROSING LESION LEFT BREAST.  The various methods of treatment have been discussed with the patient and family. After consideration of risks, benefits and other options for treatment, the patient has consented to  Procedure(s) with comments: LEFT BREAST LUMPECTOMY WITH RADIOACTIVE SEED LOCALIZATION (Left) - LMA as a surgical intervention.  The patient's history has been reviewed, patient examined, no change in status, stable for surgery.  I have reviewed the patient's chart and labs.  Questions were answered to the patient's satisfaction.     Coralie Keens

## 2020-06-09 NOTE — Anesthesia Procedure Notes (Signed)
Procedure Name: LMA Insertion Date/Time: 06/09/2020 12:11 PM Performed by: Kathryne Hitch, CRNA Pre-anesthesia Checklist: Patient identified, Emergency Drugs available, Suction available and Patient being monitored Patient Re-evaluated:Patient Re-evaluated prior to induction Oxygen Delivery Method: Circle system utilized Preoxygenation: Pre-oxygenation with 100% oxygen Induction Type: IV induction Ventilation: Mask ventilation without difficulty LMA: LMA inserted LMA Size: 3.0 Placement Confirmation: breath sounds checked- equal and bilateral and positive ETCO2 Tube secured with: Tape Dental Injury: Teeth and Oropharynx as per pre-operative assessment

## 2020-06-09 NOTE — Op Note (Signed)
LEFT BREAST LUMPECTOMY WITH RADIOACTIVE SEED LOCALIZATION  Procedure Note  Amy Williamson 06/09/2020   Pre-op Diagnosis: COMPLEX SCLEROSING LESION LEFT BREAST     Post-op Diagnosis: same  Procedure(s): LEFT BREAST LUMPECTOMY WITH RADIOACTIVE SEED LOCALIZATION  Surgeon(s): Coralie Keens, MD  Anesthesia: General  Staff:  Circulator: Maurene Capes, RN; Donnita Falls, RN Relief Scrub: Glenna Fellows, RN Scrub Person: Courtney Heys S  Estimated Blood Loss: Minimal               Specimens: sent to path  Indications: This is a 69 year old female was found to have a suspicious area in her left breast on screening mammography.  Biopsy was performed of this showing a complex grossing lesion.  The decision was made to proceed with a radioactive seed guided localized left breast lumpectomy  Procedure: The patient was brought to the operating room and identifies correct patient.  She is placed upon the operating table general anesthesia was induced.  Her left breast was prepped and draped in usual sterile fashion.  I localized the seed underneath the lateral edge of the areola with the neoprobe.  I anesthetized the edge of the areola with Marcaine.  I then made incision with a scalpel.  I then take this down to the breast tissue electrocautery.  I then dissected medially toward the nipple and easily identified the very superficial radioactive seed.  I performed a lumpectomy staying around the seed with aid of neoprobe.  I then completed the lumpectomy.  I marked the specimen with paint.  An x-ray was performed confirming the radioactive seed and one of the clips were in the specimen.  The specimen was then sent to pathology for evaluation.  States was achieved with cautery.  I anesthetized incision further with Marcaine.  I then closed the subtenons tissue with interrupted 3-0 Vicryl sutures and closed the skin with a running 4-0 Monocryl.  Dermabond was then applied.  The patient  tolerated procedure well.  All the counts were correct at the end of the procedure.  The patient was then extubated in the operating room and taken in stable addition to recovery room.          Coralie Keens   Date: 06/09/2020  Time: 12:29 PM

## 2020-06-09 NOTE — Discharge Instructions (Signed)
[place] Office Phone Number [phone]  BREAST BIOPSY/ PARTIAL MASTECTOMY: POST OP INSTRUCTIONS  Always review your discharge instruction sheet given to you by the facility where your surgery was performed.  IF YOU HAVE DISABILITY OR FAMILY LEAVE FORMS, YOU MUST BRING THEM TO THE OFFICE FOR PROCESSING.  DO NOT GIVE THEM TO YOUR DOCTOR.  1. A prescription for pain medication may be given to you upon discharge.  Take your pain medication as prescribed, if needed.  If narcotic pain medicine is not needed, then you may take acetaminophen (Tylenol) or ibuprofen (Advil) as needed. 2. Take your usually prescribed medications unless otherwise directed 3. If you need a refill on your pain medication, please contact your pharmacy.  They will contact our office to request authorization.  Prescriptions will not be filled after 5pm or on week-ends. 4. You should eat very light the first 24 hours after surgery, such as soup, crackers, pudding, etc.  Resume your normal diet the day after surgery. 5. Most patients will experience some swelling and bruising in the breast.  Ice packs and a good support bra will help.  Swelling and bruising can take several days to resolve.  6. It is common to experience some constipation if taking pain medication after surgery.  Increasing fluid intake and taking a stool softener will usually help or prevent this problem from occurring.  A mild laxative (Milk of Magnesia or Miralax) should be taken according to package directions if there are no bowel movements after 48 hours. 7. Unless discharge instructions indicate otherwise, you may remove your bandages 24-48 hours after surgery, and you may shower at that time.  You may have steri-strips (small skin tapes) in place directly over the incision.  These strips should be left on the skin for 7-10 days.  If your surgeon used skin glue on the incision, you may shower in 24 hours.  The glue will flake off over the  next 2-3 weeks.  Any sutures or staples will be removed at the office during your follow-up visit. 8. ACTIVITIES:  You may resume regular daily activities (gradually increasing) beginning the next day.  Wearing a good support bra or sports bra minimizes pain and swelling.  You may have sexual intercourse when it is comfortable. a. You may drive when you no longer are taking prescription pain medication, you can comfortably wear a seatbelt, and you can safely maneuver your car and apply brakes. b. RETURN TO WORK:  ______________________________________________________________________________________ 9. You should see your doctor in the office for a follow-up appointment approximately two weeks after your surgery.  Your doctor's nurse will typically make your follow-up appointment when she calls you with your pathology report.  Expect your pathology report 2-3 business days after your surgery.  You may call to check if you do not hear from [place] after three days. 10. OTHER INSTRUCTIONS:OK TO SHOWER STARTING TOMORROW 11. ICE PACK,TYLENOL,AND IBUPROFEN FOR PAIN 12. NO VIGOROUS ACTIVITY FOR ONE WEEK _______________________________________________________________________________________________ _____________________________________________________________________________________________________________________________________ _____________________________________________________________________________________________________________________________________ _____________________________________________________________________________________________________________________________________  WHEN TO CALL YOUR DOCTOR: 1. Fever over 101.0 2. Nausea and/or vomiting. 3. Extreme swelling or bruising. 4. Continued bleeding from incision. 5. Increased pain, redness, or drainage from the incision.  The clinic staff is available to answer your questions during regular business hours.  Please don't hesitate to call and  ask to speak to one of the nurses for clinical concerns.  If you have a medical emergency, go to the nearest emergency room or call 911.  A surgeon from [facility] is always on call at the hospital.  For further questions, please visit [email or web address]   No Tylenol until 4:30pm if needed   Post Anesthesia Home Care Instructions  Activity: Get plenty of rest for the remainder of the day. A responsible individual must stay with you for 24 hours following the procedure.  For the next 24 hours, DO NOT: -Drive a car -Paediatric nurse -Drink alcoholic beverages -Take any medication unless instructed by your physician -Make any legal decisions or sign important papers.  Meals: Start with liquid foods such as gelatin or soup. Progress to regular foods as tolerated. Avoid greasy, spicy, heavy foods. If nausea and/or vomiting occur, drink only clear liquids until the nausea and/or vomiting subsides. Call your physician if vomiting continues.  Special Instructions/Symptoms: Your throat may feel dry or sore from the anesthesia or the breathing tube placed in your throat during surgery. If this causes discomfort, gargle with warm salt water. The discomfort should disappear within 24 hours.  If you had a scopolamine patch placed behind your ear for the management of post- operative nausea and/or vomiting:  1. The medication in the patch is effective for 72 hours, after which it should be removed.  Wrap patch in a tissue and discard in the trash. Wash hands thoroughly with soap and water. 2. You may remove the patch earlier than 72 hours if you experience unpleasant side effects which may include dry mouth, dizziness or visual disturbances. 3. Avoid touching the patch. Wash your hands with soap and water after contact with the patch.

## 2020-06-09 NOTE — Anesthesia Postprocedure Evaluation (Signed)
Anesthesia Post Note  Patient: Amy Williamson  Procedure(s) Performed: LEFT BREAST LUMPECTOMY WITH RADIOACTIVE SEED LOCALIZATION (Left Breast)     Patient location during evaluation: PACU Anesthesia Type: General Level of consciousness: awake and alert Pain management: pain level controlled Vital Signs Assessment: post-procedure vital signs reviewed and stable Respiratory status: spontaneous breathing, nonlabored ventilation and respiratory function stable Cardiovascular status: blood pressure returned to baseline and stable Postop Assessment: no apparent nausea or vomiting Anesthetic complications: no   No complications documented.  Last Vitals:  Vitals:   06/09/20 1255 06/09/20 1308  BP: 120/62   Pulse: 88   Resp: 14   Temp:    SpO2: 97% 97%    Last Pain:  Vitals:   06/09/20 1308  TempSrc:   PainSc: 0-No pain                 Lynda Rainwater

## 2020-06-10 ENCOUNTER — Encounter (HOSPITAL_BASED_OUTPATIENT_CLINIC_OR_DEPARTMENT_OTHER): Payer: Self-pay | Admitting: Surgery

## 2020-06-10 LAB — SURGICAL PATHOLOGY

## 2020-06-13 ENCOUNTER — Telehealth: Payer: Self-pay | Admitting: Internal Medicine

## 2020-06-13 NOTE — Telephone Encounter (Signed)
LVM for pt to rtn my call to schedule AWV with [facility]. Please schedule this appt if pt calls the office    Thanks, [phone] 

## 2020-06-16 ENCOUNTER — Other Ambulatory Visit: Payer: Self-pay | Admitting: Obstetrics & Gynecology

## 2020-06-22 DIAGNOSIS — R002 Palpitations: Secondary | ICD-10-CM

## 2020-06-22 HISTORY — DX: Palpitations: R00.2

## 2020-06-30 ENCOUNTER — Encounter: Payer: Self-pay | Admitting: Internal Medicine

## 2020-06-30 ENCOUNTER — Other Ambulatory Visit: Payer: Self-pay

## 2020-06-30 ENCOUNTER — Ambulatory Visit (INDEPENDENT_AMBULATORY_CARE_PROVIDER_SITE_OTHER): Payer: Medicare Other | Admitting: Internal Medicine

## 2020-06-30 VITALS — BP 130/78 | HR 57 | Temp 98.6°F | Ht 61.0 in | Wt 210.0 lb

## 2020-06-30 DIAGNOSIS — I10 Essential (primary) hypertension: Secondary | ICD-10-CM | POA: Diagnosis not present

## 2020-06-30 DIAGNOSIS — I4891 Unspecified atrial fibrillation: Secondary | ICD-10-CM | POA: Diagnosis not present

## 2020-06-30 DIAGNOSIS — I700 Atherosclerosis of aorta: Secondary | ICD-10-CM

## 2020-06-30 DIAGNOSIS — K219 Gastro-esophageal reflux disease without esophagitis: Secondary | ICD-10-CM

## 2020-06-30 DIAGNOSIS — Z0000 Encounter for general adult medical examination without abnormal findings: Secondary | ICD-10-CM | POA: Diagnosis not present

## 2020-06-30 DIAGNOSIS — E782 Mixed hyperlipidemia: Secondary | ICD-10-CM

## 2020-06-30 LAB — COMPREHENSIVE METABOLIC PANEL
ALT: 17 U/L (ref 0–35)
AST: 17 U/L (ref 0–37)
Albumin: 4.7 g/dL (ref 3.5–5.2)
Alkaline Phosphatase: 70 U/L (ref 39–117)
BUN: 22 mg/dL (ref 6–23)
CO2: 29 mEq/L (ref 19–32)
Calcium: 10 mg/dL (ref 8.4–10.5)
Chloride: 101 mEq/L (ref 96–112)
Creatinine, Ser: 0.97 mg/dL (ref 0.40–1.20)
GFR: 59.14 mL/min — ABNORMAL LOW (ref >60.00)
Glucose, Bld: 111 mg/dL — ABNORMAL HIGH (ref 70–99)
Potassium: 4.3 mEq/L (ref 3.5–5.1)
Sodium: 141 mEq/L (ref 135–145)
Total Bilirubin: 0.7 mg/dL (ref 0.2–1.2)
Total Protein: 7.4 g/dL (ref 6.0–8.3)

## 2020-06-30 LAB — CBC
HCT: 44.4 % (ref 36.0–46.0)
Hemoglobin: 14.7 g/dL (ref 12.0–15.0)
MCHC: 33.1 g/dL (ref 30.0–36.0)
MCV: 83.6 fl (ref 78.0–100.0)
Platelets: 281 10*3/uL (ref 150.0–400.0)
RBC: 5.31 Mil/uL — ABNORMAL HIGH (ref 3.87–5.11)
RDW: 14.9 % (ref 11.5–15.5)
WBC: 10.6 10*3/uL — ABNORMAL HIGH (ref 4.0–10.5)

## 2020-06-30 LAB — LIPID PANEL
Cholesterol: 177 mg/dL (ref 0–200)
HDL: 61.4 mg/dL (ref >39.00)
LDL Cholesterol: 92 mg/dL (ref 0–99)
NonHDL: 115.3
Total CHOL/HDL Ratio: 3
Triglycerides: 116 mg/dL (ref 0.0–149.0)
VLDL: 23.2 mg/dL (ref 0.0–40.0)

## 2020-06-30 LAB — HEMOGLOBIN A1C: Hgb A1c MFr Bld: 5.7 % (ref 4.6–6.5)

## 2020-06-30 LAB — TSH: TSH: 2.22 u[IU]/mL (ref 0.35–4.50)

## 2020-06-30 MED ORDER — METOPROLOL SUCCINATE ER 25 MG PO TB24: 25.0000 mg | ORAL_TABLET | Freq: Every day | ORAL | 3 refills | Status: DC

## 2020-06-30 MED ORDER — APIXABAN 5 MG PO TABS: 5.0000 mg | ORAL_TABLET | Freq: Two times a day (BID) | ORAL | 6 refills | Status: DC

## 2020-06-30 NOTE — Assessment & Plan Note (Addendum)
EKG done today for palpitations which shows new A fib. Sample given of eliquis and rx sent in for eliquis and metoprolol. Referral to cardiology done today. Counseled about new diagnosis and importance of new medications. If HR low can stop amlodipine advised to call. She has fitbit to monitor HR. Checking labs for etiology but could be reactive to recent surgery (Nov 2021) or medicine (oxybutynin).

## 2020-06-30 NOTE — Assessment & Plan Note (Signed)
Taking prilosec daily.

## 2020-06-30 NOTE — Assessment & Plan Note (Signed)
BMI 39 but complicated by GERD, hyperlipidemia and high blood pressure. Weight is stable and talked to her about diet and exercise.

## 2020-06-30 NOTE — Assessment & Plan Note (Signed)
Flu shot up to date, covid-19 up to date she will bring dates to use. Pneumonia complete. Shingrix counseled. Tetanus due 2027. Colonoscopy due 2023. Mammogram due 2022, pap smear aged out and dexa due 2025. Counseled about sun safety and mole surveillance. Counseled about the dangers of distracted driving. Given 10 year screening recommendations.

## 2020-06-30 NOTE — Patient Instructions (Addendum)
We have given you a sample of eliquis for 1 week. Take 1 pill twice a day. We have sent in a prescription for this medicine so make sure to pick this up at the pharmacy before running out. Call the office if you are unable to do this so we can help you get it.   We have also sent in metoprolol to start taking 1 pill daily to keep the heart rate from going up. We will get you in with the heart doctor as soon as possible for the new A fib.   Atrial Fibrillation  Atrial fibrillation is a type of heartbeat that is irregular or fast. If you have this condition, your heart beats without any order. This makes it hard for your heart to pump blood in a normal way. Atrial fibrillation may come and go, or it may become a long-lasting problem. If this condition is not treated, it can put you at higher risk for stroke, heart failure, and other heart problems. What are the causes? This condition may be caused by diseases that damage the heart. They include:  High blood pressure.  Heart failure.  Heart valve disease.  Heart surgery. Other causes include:  Diabetes.  Thyroid disease.  Being overweight.  Kidney disease. Sometimes the cause is not known. What increases the risk? You are more likely to develop this condition if:  You are older.  You smoke.  You exercise often and very hard.  You have a family history of this condition.  You are a man.  You use drugs.  You drink a lot of alcohol.  You have lung conditions, such as emphysema, pneumonia, or COPD.  You have sleep apnea. What are the signs or symptoms? Common symptoms of this condition include:  A feeling that your heart is beating very fast.  Chest pain or discomfort.  Feeling short of breath.  Suddenly feeling light-headed or weak.  Getting tired easily during activity.  Fainting.  Sweating. In some cases, there are no symptoms. How is this treated? Treatment for this condition depends on underlying  conditions and how you feel when you have atrial fibrillation. They include:  Medicines to: ? Prevent blood clots. ? Treat heart rate or heart rhythm problems.  Using devices, such as a pacemaker, to correct heart rhythm problems.  Doing surgery to remove the part of the heart that sends bad signals.  Closing an area where clots can form in the heart (left atrial appendage). In some cases, your doctor will treat other underlying conditions. Follow these instructions at home: Medicines  Take over-the-counter and prescription medicines only as told by your doctor.  Do not take any new medicines without first talking to your doctor.  If you are taking blood thinners: ? Talk with your doctor before you take any medicines that have aspirin or NSAIDs, such as ibuprofen, in them. ? Take your medicine exactly as told by your doctor. Take it at the same time each day. ? Avoid activities that could hurt or bruise you. Follow instructions about how to prevent falls. ? Wear a bracelet that says you are taking blood thinners. Or, carry a card that lists what medicines you take. Lifestyle      Do not use any products that have nicotine or tobacco in them. These include cigarettes, e-cigarettes, and chewing tobacco. If you need help quitting, ask your doctor.  Eat heart-healthy foods. Talk with your doctor about the right eating plan for you.  Exercise regularly as  told by your doctor.  Do not drink alcohol.  Lose weight if you are overweight.  Do not use drugs, including cannabis. General instructions  If you have a condition that causes breathing to stop for a short period of time (apnea), treat it as told by your doctor.  Keep a healthy weight. Do not use diet pills unless your doctor says they are safe for you. Diet pills may make heart problems worse.  Keep all follow-up visits as told by your doctor. This is important. Contact a doctor if:  You notice a change in the speed,  rhythm, or strength of your heartbeat.  You are taking a blood-thinning medicine and you get more bruising.  You get tired more easily when you move or exercise.  You have a sudden change in weight. Get help right away if:   You have pain in your chest or your belly (abdomen).  You have trouble breathing.  You have side effects of blood thinners, such as blood in your vomit, poop (stool), or pee (urine), or bleeding that cannot stop.  You have any signs of a stroke. "BE FAST" is an easy way to remember the main warning signs: ? B - Balance. Signs are dizziness, sudden trouble walking, or loss of balance. ? E - Eyes. Signs are trouble seeing or a change in how you see. ? F - Face. Signs are sudden weakness or loss of feeling in the face, or the face or eyelid drooping on one side. ? A - Arms. Signs are weakness or loss of feeling in an arm. This happens suddenly and usually on one side of the body. ? S - Speech. Signs are sudden trouble speaking, slurred speech, or trouble understanding what people say. ? T - Time. Time to call emergency services. Write down what time symptoms started.  You have other signs of a stroke, such as: ? A sudden, very bad headache with no known cause. ? Feeling like you may vomit (nausea). ? Vomiting. ? A seizure. These symptoms may be an emergency. Do not wait to see if the symptoms will go away. Get medical help right away. Call your local emergency services (911 in the [place] Do not drive yourself to the hospital. Summary  Atrial fibrillation is a type of heartbeat that is irregular or fast.  You are at higher risk of this condition if you smoke, are older, have diabetes, or are overweight.  Follow your doctor's instructions about medicines, diet, exercise, and follow-up visits.  Get help right away if you have signs or symptoms of a stroke.  Get help right away if you cannot catch your breath, or you have chest pain or discomfort. This  information is not intended to replace advice given to you by your health care provider. Make sure you discuss any questions you have with your health care provider. Document Revised: 12/31/2018 Document Reviewed: 12/31/2018 Elsevier Patient Education  [place] Maintenance, Female Adopting a healthy lifestyle and getting preventive care are important in promoting health and wellness. Ask your health care provider about:  The right schedule for you to have regular tests and exams.  Things you can do on your own to prevent diseases and keep yourself healthy. What should I know about diet, weight, and exercise? Eat a healthy diet   Eat a diet that includes plenty of vegetables, fruits, low-fat dairy products, and lean protein.  Do not eat a lot of foods that are high in  solid fats, added sugars, or sodium. Maintain a healthy weight Body mass index (BMI) is used to identify weight problems. It estimates body fat based on height and weight. Your health care provider can help determine your BMI and help you achieve or maintain a healthy weight. Get regular exercise Get regular exercise. This is one of the most important things you can do for your health. Most adults should:  Exercise for at least 150 minutes each week. The exercise should increase your heart rate and make you sweat (moderate-intensity exercise).  Do strengthening exercises at least twice a week. This is in addition to the moderate-intensity exercise.  Spend less time sitting. Even light physical activity can be beneficial. Watch cholesterol and blood lipids Have your blood tested for lipids and cholesterol at 70 years of age, then have this test every 5 years. Have your cholesterol levels checked more often if:  Your lipid or cholesterol levels are high.  You are older than 69 years of age.  You are at high risk for heart disease. What should I know about cancer screening? Depending on your health  history and family history, you may need to have cancer screening at various ages. This may include screening for:  Breast cancer.  Cervical cancer.  Colorectal cancer.  Skin cancer.  Lung cancer. What should I know about heart disease, diabetes, and high blood pressure? Blood pressure and heart disease  High blood pressure causes heart disease and increases the risk of stroke. This is more likely to develop in people who have high blood pressure readings, are of African descent, or are overweight.  Have your blood pressure checked: ? Every 3-5 years if you are 70-71 years of age. ? Every year if you are 87 years old or older. Diabetes Have regular diabetes screenings. This checks your fasting blood sugar level. Have the screening done:  Once every three years after age 65 if you are at a normal weight and have a low risk for diabetes.  More often and at a younger age if you are overweight or have a high risk for diabetes. What should I know about preventing infection? Hepatitis B If you have a higher risk for hepatitis B, you should be screened for this virus. Talk with your health care provider to find out if you are at risk for hepatitis B infection. Hepatitis C Testing is recommended for:  Everyone born from [birth year] through 1966.  Anyone with known risk factors for hepatitis C. Sexually transmitted infections (STIs)  Get screened for STIs, including gonorrhea and chlamydia, if: ? You are sexually active and are younger than 70 years of age. ? You are older than 70 years of age and your health care provider tells you that you are at risk for this type of infection. ? Your sexual activity has changed since you were last screened, and you are at increased risk for chlamydia or gonorrhea. Ask your health care provider if you are at risk.  Ask your health care provider about whether you are at high risk for HIV. Your health care provider may recommend a prescription medicine to  help prevent HIV infection. If you choose to take medicine to prevent HIV, you should first get tested for HIV. You should then be tested every 3 months for as long as you are taking the medicine. Pregnancy  If you are about to stop having your period (premenopausal) and you may become pregnant, seek counseling before you get pregnant.  Take 400  to 800 micrograms (mcg) of folic acid every day if you become pregnant.  Ask for birth control (contraception) if you want to prevent pregnancy. Osteoporosis and menopause Osteoporosis is a disease in which the bones lose minerals and strength with aging. This can result in bone fractures. If you are 62 years old or older, or if you are at risk for osteoporosis and fractures, ask your health care provider if you should:  Be screened for bone loss.  Take a calcium or vitamin D supplement to lower your risk of fractures.  Be given hormone replacement therapy (HRT) to treat symptoms of menopause. Follow these instructions at home: Lifestyle  Do not use any products that contain nicotine or tobacco, such as cigarettes, e-cigarettes, and chewing tobacco. If you need help quitting, ask your health care provider.  Do not use street drugs.  Do not share needles.  Ask your health care provider for help if you need support or information about quitting drugs. Alcohol use  Do not drink alcohol if: ? Your health care provider tells you not to drink. ? You are pregnant, may be pregnant, or are planning to become pregnant.  If you drink alcohol: ? Limit how much you use to 0-1 drink a day. ? Limit intake if you are breastfeeding.  Be aware of how much alcohol is in your drink. In the [place] one drink equals one 12 oz bottle of beer (355 mL), one 5 oz glass of wine (148 mL), or one 1 oz glass of hard liquor (44 mL). General instructions  Schedule regular health, dental, and eye exams.  Stay current with your vaccines.  Tell your health care  provider if: ? You often feel depressed. ? You have ever been abused or do not feel safe at home. Summary  Adopting a healthy lifestyle and getting preventive care are important in promoting health and wellness.  Follow your health care provider's instructions about healthy diet, exercising, and getting tested or screened for diseases.  Follow your health care provider's instructions on monitoring your cholesterol and blood pressure. This information is not intended to replace advice given to you by your health care provider. Make sure you discuss any questions you have with your health care provider. Document Revised: 07/02/2018 Document Reviewed: 07/02/2018 Elsevier Patient Education  2020 [employer].

## 2020-06-30 NOTE — Assessment & Plan Note (Signed)
BP at goal on amlodipine but adding metoprolol for new a fib. EKG done today.

## 2020-06-30 NOTE — Assessment & Plan Note (Signed)
Checking lipid panel and adjust as needed her simvastatin.

## 2020-06-30 NOTE — Progress Notes (Signed)
Subjective:   Patient ID: Amy Williamson, female    DOB: 11/13/1950, 69 y.o.   MRN: [id number]  HPI Here for medicare wellness and physical, no new complaints. Please see A/P for status and treatment of chronic medical problems.   HPI #3: Here for new concerns about palpitations: started in the past several weeks since she was started on medications by gyn for her bladder frequency. She was originally started on myrbetriq which caused her to have high BP. She then was stopped and started on oxybutynin 5 mg which seemed to be okay. It was then increased to 10 mg which she started taking. She had an episode of severe palpitations and high BP with HR to 140. She did not seek care at this time. She contacted her ob/gyn and they advised her to stop taking this medication and keep her visit with [place] today. She stopped this about 4-5 days ago. She is still having some palpitations especially with activity. Denies chest pain but mild tightness with activity as well. Mother with A fib. She denies change in caffeine.   Diet: heart healthy Physical activity: sedentary Depression/mood screen: negative Hearing: intact to whispered voice, mild loss bilaterally Visual acuity: grossly normal, performs annual eye exam  ADLs: capable Fall risk: none Home safety: good Cognitive evaluation: intact to orientation, naming, recall and repetition EOL planning: adv directives discussed  [place] Visit from 09/19/2018 in [place]  PHQ-2 Total Score 4      [place] Visit from 09/19/2018 in [place]  PHQ-9 Total Score 15      I have personally reviewed and have noted 1. The patient's medical and social history - reviewed today no changes 2. Their use of alcohol, tobacco or illicit drugs 3. Their current medications and supplements 4. The patient's functional ability including ADL's, fall risks, home safety risks and hearing or  visual impairment. 5. Diet and physical activities 6. Evidence for depression or mood disorders 7. Care team reviewed and updated  Patient Care Team: Hoyt Koch, MD as PCP - General (Internal Medicine) Lafayette Dragon, MD (Inactive) (Gastroenterology) Megan Salon, MD (Gynecology) Peggye Form, MD (Gastroenterology) Calvert Cantor, MD (Ophthalmology) Roseanne Kaufman, MD (Orthopedic Surgery) Past Medical History:  Diagnosis Date  . Arthritis    BACK AND SHOULDERS  . Bronchitis 07-14-11  . Cataract immature BILATERAL  . Depressive disorder, not elsewhere classified   . Dysphagia   . History of esophageal reflux   . History of migraine headaches   . History of pneumonia 05-14-11  . Obesity, unspecified   . Palpitations   . Plantar fasciitis 08/2017   left foot   . Pure hypercholesterolemia   . Unspecified essential hypertension   . Unspecified venous (peripheral) insufficiency    Past Surgical History:  Procedure Laterality Date  . BREAST LUMPECTOMY WITH RADIOACTIVE SEED LOCALIZATION Left 06/09/2020   Procedure: LEFT BREAST LUMPECTOMY WITH RADIOACTIVE SEED LOCALIZATION;  Surgeon: Coralie Keens, MD;  Location: [place];  Service: General;  Laterality: Left;  LMA  . CARPAL TUNNEL RELEASE Right 2004  . CARPAL TUNNEL RELEASE Left 2006  . CESAREAN SECTION  [id number]  . EAR CYST EXCISION  08/03/2011   patient states this is incorrect  . KNEE ARTHROSCOPY    . KNEE ARTHROSCOPY W/ MENISCAL REPAIR  08/03/2011   with popliteal cyst excition  . KNEE ARTHROSCOPY WITH EXCISION BAKER'S CYST    . PULLEY RELEASE  RIGHT TRIGGER FINGER Right 01/08/2011  . TRIGGER FINGER RELEASE Right 04/20/13  . TRIGGER FINGER RELEASE Left 05/06/14  . TUBAL LIGATION  1977   with second cesarean section   Family History  Problem Relation Age of Onset  . Heart failure Mother   . Osteoporosis Mother   . Hyperlipidemia Mother   . Hypertension Mother   . Heart disease Father    . Clotting disorder Father   . Liver cancer Brother   . Liver disease Maternal Aunt        x2  . Colon cancer Neg Hx     Review of Systems  Constitutional: Positive for fatigue.  HENT: Negative.   Eyes: Negative.   Respiratory: Negative for cough, chest tightness and shortness of breath.   Cardiovascular: Positive for palpitations. Negative for chest pain and leg swelling.  Gastrointestinal: Negative for abdominal distention, abdominal pain, constipation, diarrhea, nausea and vomiting.  Musculoskeletal: Negative.   Skin: Negative.   Neurological: Negative.   Psychiatric/Behavioral: Negative.     Objective:  Physical Exam Constitutional:      Appearance: She is well-developed and well-nourished. She is obese.  HENT:     Head: Normocephalic and atraumatic.  Eyes:     Extraocular Movements: EOM normal.  Cardiovascular:     Rate and Rhythm: Normal rate. Rhythm irregular.  Pulmonary:     Effort: Pulmonary effort is normal. No respiratory distress.     Breath sounds: Normal breath sounds. No wheezing or rales.  Abdominal:     General: Bowel sounds are normal. There is no distension.     Palpations: Abdomen is soft.     Tenderness: There is no abdominal tenderness. There is no rebound.  Musculoskeletal:        General: No edema.     Cervical back: Normal range of motion.  Skin:    General: Skin is warm and dry.  Neurological:     Mental Status: She is alert and oriented to person, place, and time.     Coordination: Coordination normal.  Psychiatric:        Mood and Affect: Mood and affect normal.     Vitals:   06/30/20 0801  BP: 130/78  Pulse: (!) 57  Temp: 98.6 F (37 C)  TempSrc: Oral  SpO2: 99%  Weight: 210 lb (95.3 kg)  Height: 5\' 1"  (1.549 m)   EKG: Rate 117, axis normal, intervals stable, a fib with some pvcs, no st or t wave changes, significant change from EKG 06/06/20 as A fib is new  This visit occurred during the SARS-CoV-2 public health emergency.   Safety protocols were in place, including screening questions prior to the visit, additional usage of staff PPE, and extensive cleaning of exam room while observing appropriate contact time as indicated for disinfecting solutions.   Assessment & Plan:

## 2020-06-30 NOTE — Assessment & Plan Note (Signed)
Seen on lumbar x-ray previously. On statin and advised to continue.

## 2020-07-01 ENCOUNTER — Encounter: Payer: Self-pay | Admitting: Internal Medicine

## 2020-07-08 ENCOUNTER — Other Ambulatory Visit: Payer: Self-pay

## 2020-07-08 ENCOUNTER — Encounter: Payer: Self-pay | Admitting: *Deleted

## 2020-07-08 ENCOUNTER — Encounter: Payer: Self-pay | Admitting: Internal Medicine

## 2020-07-08 ENCOUNTER — Telehealth: Payer: Self-pay | Admitting: *Deleted

## 2020-07-08 ENCOUNTER — Ambulatory Visit: Payer: Medicare Other | Admitting: Internal Medicine

## 2020-07-08 VITALS — BP 122/70 | HR 105 | Ht 60.0 in | Wt 214.4 lb

## 2020-07-08 DIAGNOSIS — I700 Atherosclerosis of aorta: Secondary | ICD-10-CM | POA: Diagnosis not present

## 2020-07-08 DIAGNOSIS — R400 Somnolence: Secondary | ICD-10-CM | POA: Insufficient documentation

## 2020-07-08 DIAGNOSIS — R079 Chest pain, unspecified: Secondary | ICD-10-CM | POA: Diagnosis not present

## 2020-07-08 DIAGNOSIS — E782 Mixed hyperlipidemia: Secondary | ICD-10-CM

## 2020-07-08 DIAGNOSIS — I4819 Other persistent atrial fibrillation: Secondary | ICD-10-CM | POA: Diagnosis not present

## 2020-07-08 LAB — BASIC METABOLIC PANEL
BUN/Creatinine Ratio: 21 (ref 12–28)
BUN: 18 mg/dL (ref 8–27)
CO2: 25 mmol/L (ref 20–29)
Calcium: 9.8 mg/dL (ref 8.7–10.3)
Chloride: 100 mmol/L (ref 96–106)
Creatinine, Ser: 0.86 mg/dL (ref 0.57–1.00)
GFR calc Af Amer: 79 mL/min/{1.73_m2} (ref >59)
GFR calc non Af Amer: 69 mL/min/{1.73_m2} (ref >59)
Glucose: 101 mg/dL — ABNORMAL HIGH (ref 65–99)
Potassium: 4.3 mmol/L (ref 3.5–5.2)
Sodium: 139 mmol/L (ref 134–144)

## 2020-07-08 LAB — CBC
Hematocrit: 39.5 % (ref 34.0–46.6)
Hemoglobin: 13.2 g/dL (ref 11.1–15.9)
MCH: 28 pg (ref 26.6–33.0)
MCHC: 33.4 g/dL (ref 31.5–35.7)
MCV: 84 fL (ref 79–97)
Platelets: 231 10*3/uL (ref 150–450)
RBC: 4.71 x10E6/uL (ref 3.77–5.28)
RDW: 13.6 % (ref 11.7–15.4)
WBC: 9 10*3/uL (ref 3.4–10.8)

## 2020-07-08 MED ORDER — METOPROLOL SUCCINATE ER 50 MG PO TB24: 50.0000 mg | ORAL_TABLET | Freq: Every day | ORAL | 3 refills | Status: DC

## 2020-07-08 NOTE — Progress Notes (Signed)
Cardiology Office Note:    Date:  07/08/2020   ID:  Amy Williamson, DOB 12/16/1950, MRN [id number]  PCP:  Hoyt Koch, MD  [facility] Cardiologist:  No primary care provider on file.  [facility] Electrophysiologist:  None   CC: Atrial Fibrillation Consulted for the evaluation of new atrial fibrillation at the behest of Hoyt Koch, MD  History of Present Illness:    Amy Williamson is a 69 y.o. female with a hx of new atrial fibrillation, with HTN, Aortic Atherosclerosis and HLD, Morbid Obesity, history of dysphagia) sees ([facility]) who presents for evaluation.  Patient notes that she is feeling winded and racing heart.  Has felt this since the 06/27/20.  Sometimes fees heart racing:  At that time heart rate was 140.  Has felt terrible even when hear heart rate is improved.  With fast heart rate feels chest tightness.  Notes that she has had some blood pressure changes (HTN).  Had recent breast surgery.  No bleeding issues on the blood thinner.  Blood Thinner started 06/30/2020  Patient reports prior cardiac testing including 2015 echo.  Notes that she doesn't sleep well.  Notes snoring.  Notes Day Time Somnolensce  No history of pre-eclampsia.  No Fen-Phen.  Past Medical History:  Diagnosis Date  . Arthritis    BACK AND SHOULDERS  . Bronchitis 07-14-11  . Cataract immature BILATERAL  . Depressive disorder, not elsewhere classified   . Dysphagia   . History of esophageal reflux   . History of migraine headaches   . History of pneumonia 05-14-11  . Obesity, unspecified   . Palpitations   . Plantar fasciitis 08/2017   left foot   . Pure hypercholesterolemia   . Unspecified essential hypertension   . Unspecified venous (peripheral) insufficiency     Past Surgical History:  Procedure Laterality Date  . BREAST LUMPECTOMY WITH RADIOACTIVE SEED LOCALIZATION Left 06/09/2020   Procedure: LEFT BREAST LUMPECTOMY WITH RADIOACTIVE SEED LOCALIZATION;   Surgeon: Coralie Keens, MD;  Location: [place];  Service: General;  Laterality: Left;  LMA  . CARPAL TUNNEL RELEASE Right 2004  . CARPAL TUNNEL RELEASE Left 2006  . CESAREAN SECTION  [id number]  . EAR CYST EXCISION  08/03/2011   patient states this is incorrect  . KNEE ARTHROSCOPY    . KNEE ARTHROSCOPY W/ MENISCAL REPAIR  08/03/2011   with popliteal cyst excition  . KNEE ARTHROSCOPY WITH EXCISION BAKER'S CYST    . PULLEY RELEASE RIGHT TRIGGER FINGER Right 01/08/2011  . TRIGGER FINGER RELEASE Right 04/20/13  . TRIGGER FINGER RELEASE Left 05/06/14  . TUBAL LIGATION  1977   with second cesarean section    Current Medications: Current Meds  Medication Sig  . albuterol (PROVENTIL) (2.5 MG/3ML) 0.083% nebulizer solution Take 3 mLs (2.5 mg total) by nebulization every 6 (six) hours as needed for wheezing or shortness of breath.  . ALPRAZolam (XANAX) 0.5 MG tablet 1/2 to 1 tab q day prn anxiety  . amLODipine (NORVASC) 5 MG tablet TAKE 1 TABLET BY MOUTH EVERY DAY  . apixaban (ELIQUIS) 5 MG TABS tablet Take 1 tablet (5 mg total) by mouth 2 (two) times daily.  . diphenhydramine-acetaminophen (TYLENOL PM) 25-500 MG TABS tablet Take 1 tablet by mouth at bedtime as needed (sleep).   . fluticasone (FLONASE) 50 MCG/ACT nasal spray Place 1 spray into both nostrils daily.  . metoprolol succinate (TOPROL-XL) 25 MG 24 hr tablet Take 1 tablet (25 mg total) by  mouth daily.  [name] omeprazole (PRILOSEC) 20 MG capsule TAKE 1 CAPSULE BY MOUTH EVERY DAY  . simvastatin (ZOCOR) 20 MG tablet TAKE 1 TABLET BY MOUTH EVERYDAY AT BEDTIME     Allergies:   Atenolol, Avelox [moxifloxacin hcl in nacl], Azithromycin, Doxycycline, Myrbetriq [mirabegron], and Oxybutynin   Social History   Socioeconomic History  . Marital status: Married    Spouse name: david  . Number of children: 2  . Years of education: Not on file  . Highest education level: Not on file  Occupational History  . Occupation: retired   Tobacco Use  . Smoking status: Never Smoker  . Smokeless tobacco: Never Used  Vaping Use  . Vaping Use: Never used  Substance and Sexual Activity  . Alcohol use: No  . Drug use: No  . Sexual activity: Not Currently    Comment: BTL  Other Topics Concern  . Not on file  Social History Narrative  . Not on file   Social Determinants of Health   Financial Resource Strain: Not on file  Food Insecurity: Not on file  Transportation Needs: Not on file  Physical Activity: Not on file  Stress: Not on file  Social Connections: Not on file     Family History: The patient's family history includes Clotting disorder in her father; Heart disease in her father; Heart failure in her mother; Hyperlipidemia in her mother; Hypertension in her mother; Liver cancer in her brother; Liver disease in her maternal aunt; Osteoporosis in her mother. There is no history of Colon cancer. History of coronary artery disease notable for no members. History of heart failure notable for mother. No history of cardiomyopathies including hypertrophic cardiomyopathy, left ventricular non-compaction, or arrhythmogenic right ventricular cardiomyopathy. History of arrhythmia notable for AF in mother. Denies family history of sudden cardiac death including drowning, car accidents, or unexplained deaths in the family. No history of bicuspid aortic valve or aortic aneurysm or dissection.  ROS:   Please see the history of present illness.     All other systems reviewed and are negative.  EKGs/Labs/Other Studies Reviewed:    The following studies were reviewed today:  EKG:  06/30/20:  A fib 70   Transthoracic Echocardiogram: Date: 10/14/2013 Results: - Left ventricle: The cavity size was normal. Systolic  function was normal. The estimated ejection fraction was  in the range of 55% to 60%. Wall motion was normal; there  were no regional wall motion abnormalities. Doppler  parameters are consistent with  abnormal left ventricular  relaxation (grade 1 diastolic dysfunction). Doppler  parameters are consistent with elevated ventricular  end-diastolic filling pressure.  - Aortic valve: Trileaflet; normal thickness leaflets.  Transvalvular velocity was within the normal range. There  was no stenosis. No regurgitation.  - Mitral valve: Mildly thickened leaflets . Trivial  regurgitation.  - Left atrium: The atrium was normal in size.  - Right ventricle: Systolic function was normal.  - Right atrium: The atrium was normal in size.  - Pulmonic valve: Mild regurgitation.  - Pulmonary arteries: Systolic pressure was within the  normal range.  - Pericardium, extracardiac: There was no pericardial  effusion.   NonCardiac CT: Date: 09/23/2013 Results: No coronary calcification; Aortic Arch Calcifications  Recent Labs: 06/30/2020: ALT 17; BUN 22; Creatinine, Ser 0.97; Hemoglobin 14.7; Platelets 281.0; Potassium 4.3; Sodium 141; TSH 2.22  Recent Lipid Panel    Component Value Date/Time   CHOL 177 06/30/2020 0903   TRIG 116.0 06/30/2020 0903   TRIG 54 06/25/2006  0725   HDL 61.40 06/30/2020 0903   CHOLHDL 3 06/30/2020 0903   VLDL 23.2 06/30/2020 0903   LDLCALC 92 06/30/2020 0903   LDLDIRECT 142.3 03/16/2013 0732    Risk Assessment/Calculations:     CHA2DS2-VASc Score = 3  This indicates a 3.2% annual risk of stroke. The patient's score is based upon: CHF History: No HTN History: Yes Diabetes History: No Stroke History: No Vascular Disease History: No Age Score: 1 Gender Score: 1     HASBLED 3: Risk was 5.8% in one validation study (Lip 2011) and 3.72 bleeds per 100 patient-years in another validation study (Pisters 2010).  Physical Exam:    VS:  BP 122/70   Pulse (!) 105   Ht 5' (1.524 m)   Wt 214 lb 6.4 oz (97.3 kg)   LMP 07/23/2004   SpO2 97%   BMI 41.87 kg/m     Wt Readings from Last 3 Encounters:  07/08/20 214 lb 6.4 oz (97.3 kg)  06/30/20 210 lb  (95.3 kg)  06/09/20 216 lb 11.4 oz (98.3 kg)    GEN: Obese, well developed in no acute distress HEENT: Normal NECK: No JVD; No carotid bruits LYMPHATICS: No lymphadenopathy CARDIAC: irregularly irregular rhythm, no murmurs, rubs, gallops RESPIRATORY:  Clear to auscultation without rales, wheezing or rhonchi  ABDOMEN: Soft, non-tender, non-distended MUSCULOSKELETAL:  No edema; No deformity  SKIN: Warm and dry NEUROLOGIC:  Alert and oriented x 3 PSYCHIATRIC:  Normal affect   ASSESSMENT:    1. Persistent atrial fibrillation ([place])   2. Daytime somnolence   3. Chest pain of uncertain etiology   4. Aortic atherosclerosis ([place])   5. Mixed hyperlipidemia   6. Morbid obesity ([facility])    PLAN:    In order of problems listed above:  Persistent Atrial Fibrillation,  Day Time Somnolense Chest Pain Morbid Obesity HTN and HLD with aortic atherosclerosis - CHADSVASC=3, HASBLED 3. - TSH 2.22 - discussed alcohol and exercise as preventive factors - OSA Risk Testing appropriate - Will get obtain TTE - Continue anticoagulation with Eliquis (no low dose given creatinine and and weight) - Continue rate control with increasing metoprolol succinate 50 mg Po Daily; check blood pressure and if BP is low stop norvasc and give [place] a call - Considerations for DCCV 07/28/2020 - after DCCV, will consider ischemic testing - continue statin present  Two month follow up unless new symptoms or abnormal test results warranting change in plan  Would be reasonable for  APP Follow up   Shared Decision Making/Informed Consent The risks (stroke, cardiac arrhythmias rarely resulting in the need for a temporary or permanent pacemaker, skin irritation or burns and complications associated with conscious sedation including aspiration, arrhythmia, respiratory failure and death), benefits (restoration of normal sinus rhythm) and alternatives of a direct current cardioversion were explained in detail to Amy Williamson and  she agrees to proceed.    Medication Adjustments/Labs and Tests Ordered: Current medicines are reviewed at length with the patient today.  Concerns regarding medicines are outlined above.  No orders of the defined types were placed in this encounter.  No orders of the defined types were placed in this encounter.   There are no Patient Instructions on file for this visit.   Signed, Werner Lean, MD  07/08/2020 10:17 AM    [place]

## 2020-07-08 NOTE — Patient Instructions (Addendum)
Medication Instructions:  1) INCREASE Metoprolol Succinate to 50mg  once daily  *If you need a refill on your cardiac medications before your next appointment, please call your pharmacy*   Lab Work: BMET and CBC today  If you have labs (blood work) drawn today and your tests are completely normal, you will receive your results only by: [name] MyChart Message (if you have MyChart) OR . A paper copy in the mail If you have any lab test that is abnormal or we need to change your treatment, we will call you to review the results.   Testing/Procedures: Your physician has requested that you have an echocardiogram. Echocardiography is a painless test that uses sound waves to create images of your heart. It provides your doctor with information about the size and shape of your heart and how well your heart's chambers and valves are working. This procedure takes approximately one hour. There are no restrictions for this procedure.  Your physician has recommended that you have a sleep study. This test records several body functions during sleep, including: brain activity, eye movement, oxygen and carbon dioxide blood levels, heart rate and rhythm, breathing rate and rhythm, the flow of air through your mouth and nose, snoring, body muscle movements, and chest and belly movement.   Your physician has recommended that you have a Cardioversion (DCCV). Electrical Cardioversion uses a jolt of electricity to your heart either through paddles or wired patches attached to your chest. This is a controlled, usually prescheduled, procedure. Defibrillation is done under light anesthesia in the hospital, and you usually go home the day of the procedure. This is done to get your heart back into a normal rhythm. You are not awake for the procedure. Please see the instruction sheet given to you today.    Follow-Up: At [facility], you and your health needs are our priority.  As part of our continuing mission to provide  you with exceptional heart care, we have created designated Provider Care Teams.  These Care Teams include your primary Cardiologist (physician) and Advanced Practice Providers (APPs -  Physician Assistants and Nurse Practitioners) who all work together to provide you with the care you need, when you need it.  We recommend signing up for the patient portal called "MyChart".  Sign up information is provided on this After Visit Summary.  MyChart is used to connect with patients for Virtual Visits (Telemedicine).  Patients are able to view lab/test results, encounter notes, upcoming appointments, etc.  Non-urgent messages can be sent to your provider as well.   To learn more about what you can do with MyChart, go to [email or web address].    Your next appointment:   2 month(s)  The format for your next appointment:   In Person  Provider:   You may see Rudean Haskell, MD or one of the following Advanced Practice Providers on your designated Care Team:    Melina Copa, PA-C  Ermalinda Barrios, PA-C    Other Instructions   Due to recent COVID-19 restrictions implemented by our local and state authorities and in an effort to keep both patients and staff as safe as possible, our hospital system requires COVID-19 testing prior to certain scheduled hospital procedures.  Please go to [address] [place], [place] [id number] on __________ at __________  .  This is a drive up testing site.  You will not need to exit your vehicle.  You must agree to self-quarantine from the time of your testing until the procedure date  on 07/28/2020.  This should included staying home with ONLY the people you live with.  Avoid take-out, grocery store shopping or leaving the house for any non-emergent reason.  Failure to have your COVID-19 test done on the date and time you have been scheduled will result in cancellation of your procedure.  Please call our office at [phone] if you have any questions.

## 2020-07-08 NOTE — H&P (View-Only) (Signed)
Cardiology Office Note:    Date:  07/08/2020   ID:  Amy Williamson, DOB May 21, 1951, MRN [id number]  PCP:  Hoyt Koch, MD  [facility] Cardiologist:  No primary care provider on file.  [facility] Electrophysiologist:  None   CC: Atrial Fibrillation Consulted for the evaluation of new atrial fibrillation at the behest of Hoyt Koch, MD  History of Present Illness:    Amy Williamson is a 69 y.o. female with a hx of new atrial fibrillation, with HTN, Aortic Atherosclerosis and HLD, Morbid Obesity, history of dysphagia) sees ([facility]) who presents for evaluation.  Patient notes that she is feeling winded and racing heart.  Has felt this since the 06/27/20.  Sometimes fees heart racing:  At that time heart rate was 140.  Has felt terrible even when hear heart rate is improved.  With fast heart rate feels chest tightness.  Notes that she has had some blood pressure changes (HTN).  Had recent breast surgery.  No bleeding issues on the blood thinner.  Blood Thinner started 06/30/2020  Patient reports prior cardiac testing including 2015 echo.  Notes that she doesn't sleep well.  Notes snoring.  Notes Day Time Somnolensce  No history of pre-eclampsia.  No Fen-Phen.  Past Medical History:  Diagnosis Date  . Arthritis    BACK AND SHOULDERS  . Bronchitis 07-14-11  . Cataract immature BILATERAL  . Depressive disorder, not elsewhere classified   . Dysphagia   . History of esophageal reflux   . History of migraine headaches   . History of pneumonia 05-14-11  . Obesity, unspecified   . Palpitations   . Plantar fasciitis 08/2017   left foot   . Pure hypercholesterolemia   . Unspecified essential hypertension   . Unspecified venous (peripheral) insufficiency     Past Surgical History:  Procedure Laterality Date  . BREAST LUMPECTOMY WITH RADIOACTIVE SEED LOCALIZATION Left 06/09/2020   Procedure: LEFT BREAST LUMPECTOMY WITH RADIOACTIVE SEED LOCALIZATION;   Surgeon: Coralie Keens, MD;  Location: [place];  Service: General;  Laterality: Left;  LMA  . CARPAL TUNNEL RELEASE Right 2004  . CARPAL TUNNEL RELEASE Left 2006  . CESAREAN SECTION  2979,8921  . EAR CYST EXCISION  08/03/2011   patient states this is incorrect  . KNEE ARTHROSCOPY    . KNEE ARTHROSCOPY W/ MENISCAL REPAIR  08/03/2011   with popliteal cyst excition  . KNEE ARTHROSCOPY WITH EXCISION BAKER'S CYST    . PULLEY RELEASE RIGHT TRIGGER FINGER Right 01/08/2011  . TRIGGER FINGER RELEASE Right 04/20/13  . TRIGGER FINGER RELEASE Left 05/06/14  . TUBAL LIGATION  1977   with second cesarean section    Current Medications: Current Meds  Medication Sig  . albuterol (PROVENTIL) (2.5 MG/3ML) 0.083% nebulizer solution Take 3 mLs (2.5 mg total) by nebulization every 6 (six) hours as needed for wheezing or shortness of breath.  . ALPRAZolam (XANAX) 0.5 MG tablet 1/2 to 1 tab q day prn anxiety  . amLODipine (NORVASC) 5 MG tablet TAKE 1 TABLET BY MOUTH EVERY DAY  . apixaban (ELIQUIS) 5 MG TABS tablet Take 1 tablet (5 mg total) by mouth 2 (two) times daily.  . diphenhydramine-acetaminophen (TYLENOL PM) 25-500 MG TABS tablet Take 1 tablet by mouth at bedtime as needed (sleep).   . fluticasone (FLONASE) 50 MCG/ACT nasal spray Place 1 spray into both nostrils daily.  . metoprolol succinate (TOPROL-XL) 25 MG 24 hr tablet Take 1 tablet (25 mg total) by  mouth daily.  [name] omeprazole (PRILOSEC) 20 MG capsule TAKE 1 CAPSULE BY MOUTH EVERY DAY  . simvastatin (ZOCOR) 20 MG tablet TAKE 1 TABLET BY MOUTH EVERYDAY AT BEDTIME     Allergies:   Atenolol, Avelox [moxifloxacin hcl in nacl], Azithromycin, Doxycycline, Myrbetriq [mirabegron], and Oxybutynin   Social History   Socioeconomic History  . Marital status: Married    Spouse name: david  . Number of children: 2  . Years of education: Not on file  . Highest education level: Not on file  Occupational History  . Occupation: retired   Tobacco Use  . Smoking status: Never Smoker  . Smokeless tobacco: Never Used  Vaping Use  . Vaping Use: Never used  Substance and Sexual Activity  . Alcohol use: No  . Drug use: No  . Sexual activity: Not Currently    Comment: BTL  Other Topics Concern  . Not on file  Social History Narrative  . Not on file   Social Determinants of Health   Financial Resource Strain: Not on file  Food Insecurity: Not on file  Transportation Needs: Not on file  Physical Activity: Not on file  Stress: Not on file  Social Connections: Not on file     Family History: The patient's family history includes Clotting disorder in her father; Heart disease in her father; Heart failure in her mother; Hyperlipidemia in her mother; Hypertension in her mother; Liver cancer in her brother; Liver disease in her maternal aunt; Osteoporosis in her mother. There is no history of Colon cancer. History of coronary artery disease notable for no members. History of heart failure notable for mother. No history of cardiomyopathies including hypertrophic cardiomyopathy, left ventricular non-compaction, or arrhythmogenic right ventricular cardiomyopathy. History of arrhythmia notable for AF in mother. Denies family history of sudden cardiac death including drowning, car accidents, or unexplained deaths in the family. No history of bicuspid aortic valve or aortic aneurysm or dissection.  ROS:   Please see the history of present illness.     All other systems reviewed and are negative.  EKGs/Labs/Other Studies Reviewed:    The following studies were reviewed today:  EKG:  06/30/20:  A fib 70   Transthoracic Echocardiogram: Date: 10/14/2013 Results: - Left ventricle: The cavity size was normal. Systolic  function was normal. The estimated ejection fraction was  in the range of 55% to 60%. Wall motion was normal; there  were no regional wall motion abnormalities. Doppler  parameters are consistent with  abnormal left ventricular  relaxation (grade 1 diastolic dysfunction). Doppler  parameters are consistent with elevated ventricular  end-diastolic filling pressure.  - Aortic valve: Trileaflet; normal thickness leaflets.  Transvalvular velocity was within the normal range. There  was no stenosis. No regurgitation.  - Mitral valve: Mildly thickened leaflets . Trivial  regurgitation.  - Left atrium: The atrium was normal in size.  - Right ventricle: Systolic function was normal.  - Right atrium: The atrium was normal in size.  - Pulmonic valve: Mild regurgitation.  - Pulmonary arteries: Systolic pressure was within the  normal range.  - Pericardium, extracardiac: There was no pericardial  effusion.   NonCardiac CT: Date: 09/23/2013 Results: No coronary calcification; Aortic Arch Calcifications  Recent Labs: 06/30/2020: ALT 17; BUN 22; Creatinine, Ser 0.97; Hemoglobin 14.7; Platelets 281.0; Potassium 4.3; Sodium 141; TSH 2.22  Recent Lipid Panel    Component Value Date/Time   CHOL 177 06/30/2020 0903   TRIG 116.0 06/30/2020 0903   TRIG 54 06/25/2006  0725   HDL 61.40 06/30/2020 0903   CHOLHDL 3 06/30/2020 0903   VLDL 23.2 06/30/2020 0903   LDLCALC 92 06/30/2020 0903   LDLDIRECT 142.3 03/16/2013 0732    Risk Assessment/Calculations:     CHA2DS2-VASc Score = 3  This indicates a 3.2% annual risk of stroke. The patient's score is based upon: CHF History: No HTN History: Yes Diabetes History: No Stroke History: No Vascular Disease History: No Age Score: 1 Gender Score: 1     HASBLED 3: Risk was 5.8% in one validation study (Lip 2011) and 3.72 bleeds per 100 patient-years in another validation study (Pisters 2010).  Physical Exam:    VS:  BP 122/70   Pulse (!) 105   Ht 5' (1.524 m)   Wt 214 lb 6.4 oz (97.3 kg)   LMP 07/23/2004   SpO2 97%   BMI 41.87 kg/m     Wt Readings from Last 3 Encounters:  07/08/20 214 lb 6.4 oz (97.3 kg)  06/30/20 210 lb  (95.3 kg)  06/09/20 216 lb 11.4 oz (98.3 kg)    GEN: Obese, well developed in no acute distress HEENT: Normal NECK: No JVD; No carotid bruits LYMPHATICS: No lymphadenopathy CARDIAC: irregularly irregular rhythm, no murmurs, rubs, gallops RESPIRATORY:  Clear to auscultation without rales, wheezing or rhonchi  ABDOMEN: Soft, non-tender, non-distended MUSCULOSKELETAL:  No edema; No deformity  SKIN: Warm and dry NEUROLOGIC:  Alert and oriented x 3 PSYCHIATRIC:  Normal affect   ASSESSMENT:    1. Persistent atrial fibrillation ([place])   2. Daytime somnolence   3. Chest pain of uncertain etiology   4. Aortic atherosclerosis ([place])   5. Mixed hyperlipidemia   6. Morbid obesity (HCC)    PLAN:    In order of problems listed above:  Persistent Atrial Fibrillation,  Day Time Somnolense Chest Pain Morbid Obesity HTN and HLD with aortic atherosclerosis - CHADSVASC=3, HASBLED 3. - TSH 2.22 - discussed alcohol and exercise as preventive factors - OSA Risk Testing appropriate - Will get obtain TTE - Continue anticoagulation with Eliquis (no low dose given creatinine and and weight) - Continue rate control with increasing metoprolol succinate 50 mg Po Daily; check blood pressure and if BP is low stop norvasc and give [place] a call - Considerations for DCCV 07/28/2020 - after DCCV, will consider ischemic testing - continue statin present  Two month follow up unless new symptoms or abnormal test results warranting change in plan  Would be reasonable for  APP Follow up   Shared Decision Making/Informed Consent The risks (stroke, cardiac arrhythmias rarely resulting in the need for a temporary or permanent pacemaker, skin irritation or burns and complications associated with conscious sedation including aspiration, arrhythmia, respiratory failure and death), benefits (restoration of normal sinus rhythm) and alternatives of a direct current cardioversion were explained in detail to Ms. Persing and  she agrees to proceed.    Medication Adjustments/Labs and Tests Ordered: Current medicines are reviewed at length with the patient today.  Concerns regarding medicines are outlined above.  No orders of the defined types were placed in this encounter.  No orders of the defined types were placed in this encounter.   There are no Patient Instructions on file for this visit.   Signed, Werner Lean, MD  07/08/2020 10:17 AM    [place]

## 2020-07-08 NOTE — Telephone Encounter (Signed)
-----   Message from Loren Racer, RN sent at 07/08/2020 11:19 AM EST ----- Sleep study ordered

## 2020-07-12 NOTE — Telephone Encounter (Signed)
NO PA REQUIRED; DECISION ID: [id number]

## 2020-07-13 IMAGING — CR CHEST - 2 VIEW
2 series · 2 of 2 positions shown · non-contrast
Comparison: Radiograph 09/19/2018

CLINICAL DATA: Pt has had a fever since labial cyst removal 02/27/19.
Pt states no relief after taking ibuprofen and 2 different types of
abx. Pt states that she was told there was bacteria in the area, and
that may be causing it. Also having [place].*comment was
truncated*fever, neg covid

EXAM:
CHEST - 2 VIEW

[w chest pa]
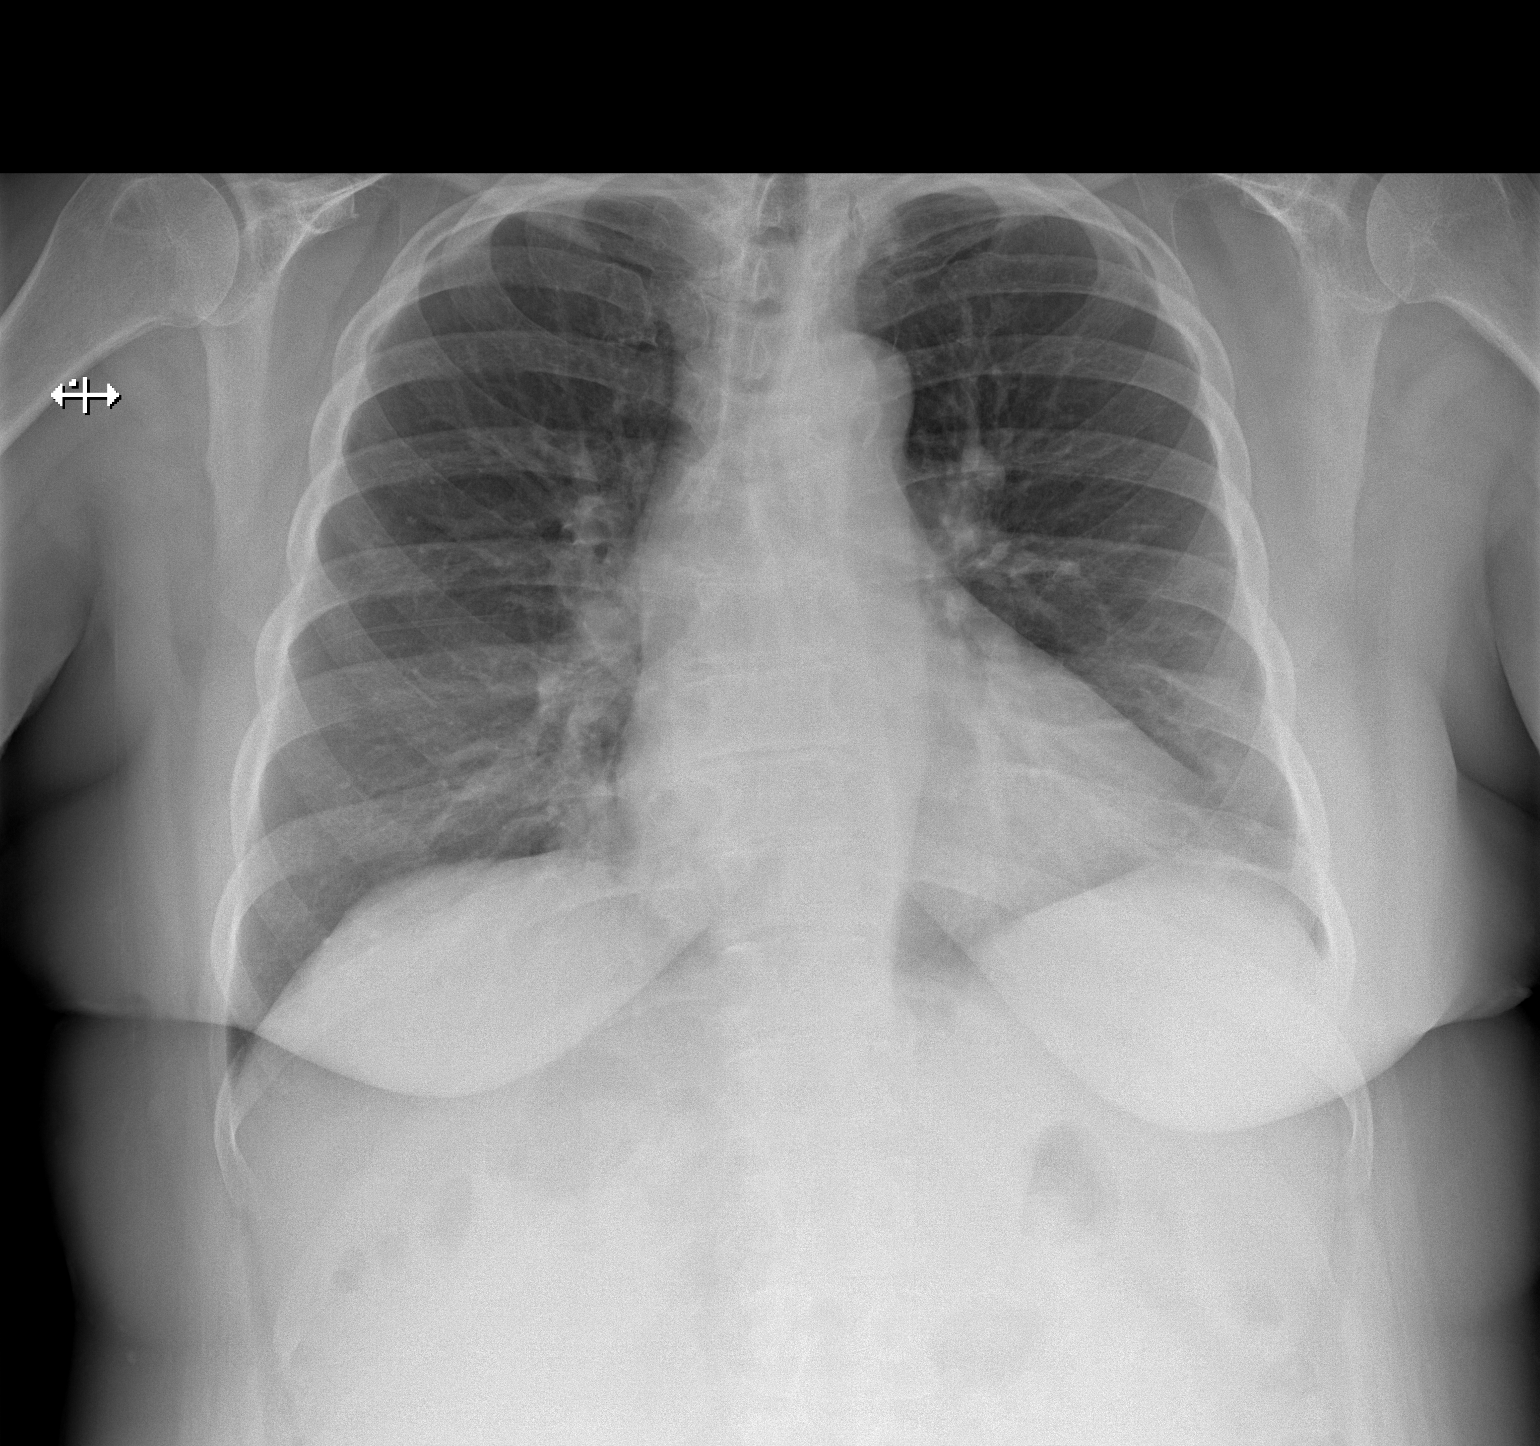

[w chest lat]
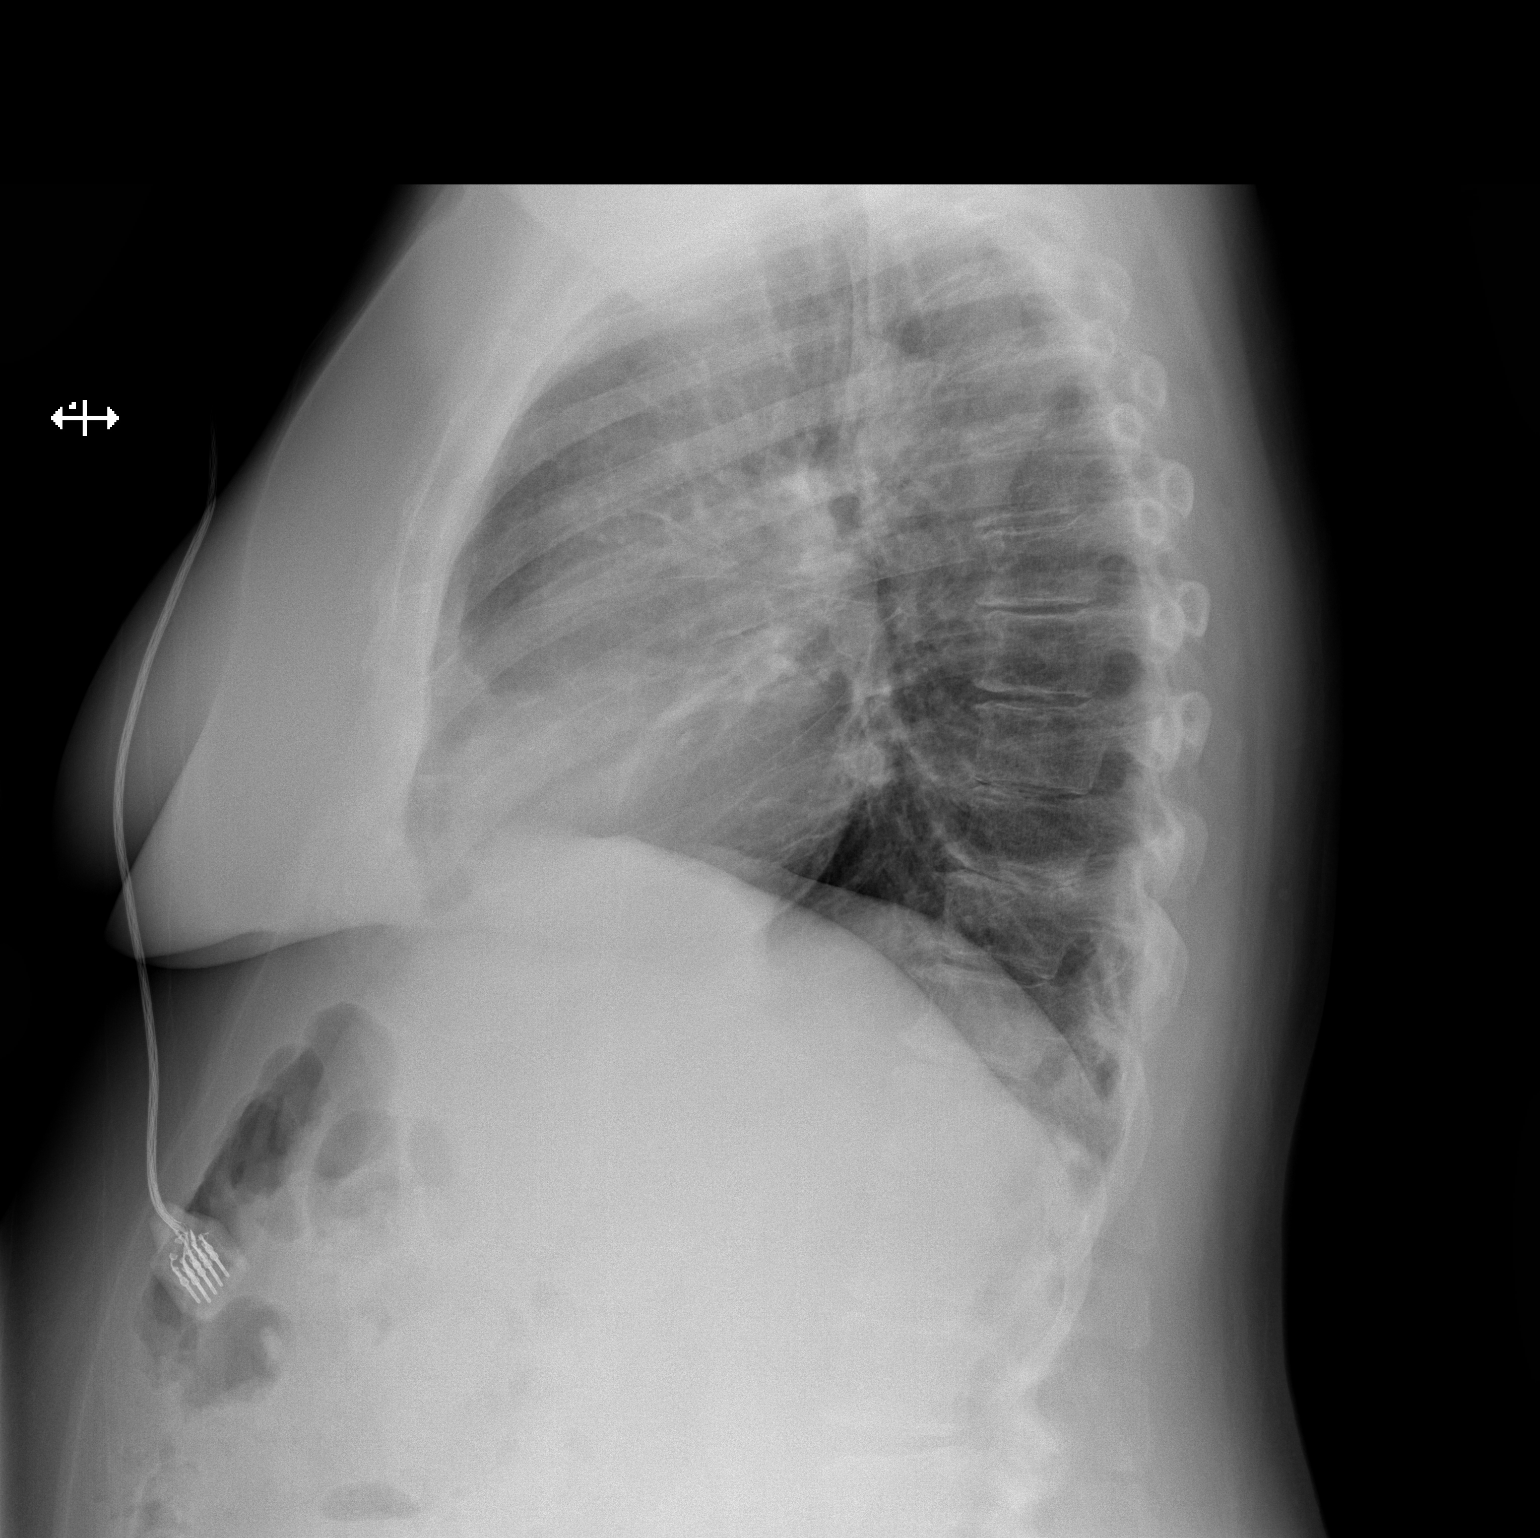

[2 of 2 positions shown; findings below may reference images not displayed]

FINDINGS: Normal mediastinum and cardiac silhouette. Normal pulmonary
vasculature. No evidence of effusion, infiltrate, or pneumothorax.
No acute bony abnormality.
IMPRESSION: No acute cardiopulmonary process.

## 2020-07-15 ENCOUNTER — Other Ambulatory Visit: Payer: Self-pay | Admitting: Internal Medicine

## 2020-07-20 ENCOUNTER — Other Ambulatory Visit: Payer: Self-pay

## 2020-07-20 ENCOUNTER — Ambulatory Visit
Admission: EM | Admit: 2020-07-20 | Discharge: 2020-07-20 | Disposition: A | Discharge status: Home / Self Care | Payer: Medicare Other | Attending: Emergency Medicine | Admitting: Emergency Medicine

## 2020-07-20 DIAGNOSIS — Z1152 Encounter for screening for COVID-19: Secondary | ICD-10-CM | POA: Diagnosis not present

## 2020-07-20 DIAGNOSIS — J069 Acute upper respiratory infection, unspecified: Secondary | ICD-10-CM

## 2020-07-20 MED ORDER — AMOXICILLIN-POT CLAVULANATE 875-125 MG PO TABS: 1.0000 | ORAL_TABLET | Freq: Two times a day (BID) | ORAL | 0 refills | Status: AC

## 2020-07-20 NOTE — ED Provider Notes (Signed)
[facility]    CSN: [id number] Arrival date & time: 07/20/20  2353      History   Chief Complaint Chief Complaint  Patient presents with   Nasal Congestion    X 4 days   Cough    X 4 days   Sinus drainage    X 4 days    HPI Amy Williamson is a 69 y.o. female history of persistent A. fib, GERD, hyperlipidemia, hypertension, presenting today for evaluation of URI symptoms.  Patient has had congestion cough and fever.  Reports she has had fevers up to 101.  Reports a lot of sinus congestion pressure and drainage.  Cough has been less prominent.  Does report grandkids sick over the holidays, but no known Covid exposures.  Denies chest pain or shortness of breath.  HPI  Past Medical History:  Diagnosis Date   Arthritis    BACK AND SHOULDERS   Bronchitis 07-14-11   Cataract immature BILATERAL   Depressive disorder, not elsewhere classified    Dysphagia    History of esophageal reflux    History of migraine headaches    History of pneumonia 05-14-11   Obesity, unspecified    Palpitations    Plantar fasciitis 08/2017   left foot    Pure hypercholesterolemia    Unspecified essential hypertension    Unspecified venous (peripheral) insufficiency     Patient Active Problem List   Diagnosis Date Noted   Persistent atrial fibrillation ([place]) 07/08/2020   Daytime somnolence 07/08/2020   Chest pain of uncertain etiology 61/44/3154   Mixed hyperlipidemia 07/08/2020   Aortic atherosclerosis ([place]) 06/30/2020   Bronchitis 06/12/2019   Routine general medical examination at a health care facility 04/02/2016   Other dysphagia 09/21/2013   New onset atrial fibrillation ([place]) 03/24/2013   Morbid obesity ([place]) 03/24/2013   DJD (degenerative joint disease) 03/24/2013   GERD (gastroesophageal reflux disease) 02/27/2012   Hyperlipidemia 01/21/2008   Essential hypertension 01/21/2008    Past Surgical History:  Procedure Laterality Date    BREAST LUMPECTOMY WITH RADIOACTIVE SEED LOCALIZATION Left 06/09/2020   Procedure: LEFT BREAST LUMPECTOMY WITH RADIOACTIVE SEED LOCALIZATION;  Surgeon: Coralie Keens, MD;  Location: [place];  Service: General;  Laterality: Left;  LMA   CARPAL TUNNEL RELEASE Right 2004   CARPAL TUNNEL RELEASE Left 2006   CESAREAN SECTION  [id number],[id number]   EAR CYST EXCISION  08/03/2011   patient states this is incorrect   KNEE ARTHROSCOPY     KNEE ARTHROSCOPY W/ MENISCAL REPAIR  08/03/2011   with popliteal cyst excition   KNEE ARTHROSCOPY WITH EXCISION BAKER'S CYST     PULLEY RELEASE RIGHT TRIGGER FINGER Right 01/08/2011   TRIGGER FINGER RELEASE Right 04/20/13   TRIGGER FINGER RELEASE Left 05/06/14   TUBAL LIGATION  1977   with second cesarean section    OB History    Gravida  2   Para  2   Term      Preterm      AB      Living  2     SAB      IAB      Ectopic      Multiple      Live Births               Home Medications    Prior to Admission medications   Medication Sig Start Date End Date Taking? Authorizing Provider  ALPRAZolam Duanne Moron) 0.5 MG tablet 1/2 to 1  tab q day prn anxiety 05/13/20  Yes Jerene Bears, MD  amLODipine (NORVASC) 5 MG tablet TAKE 1 TABLET BY MOUTH EVERY DAY 07/18/20  Yes Myrlene Broker, MD  amoxicillin-clavulanate (AUGMENTIN) 875-125 MG tablet Take 1 tablet by mouth every 12 (twelve) hours for 7 days. 07/20/20 07/27/20 Yes Joaopedro Eschbach C, PA-C  apixaban (ELIQUIS) 5 MG TABS tablet Take 1 tablet (5 mg total) by mouth 2 (two) times daily. 06/30/20  Yes Myrlene Broker, MD  diphenhydramine-acetaminophen (TYLENOL PM) 25-500 MG TABS tablet Take 1 tablet by mouth at bedtime as needed (sleep).    Yes [provider]  fluticasone (FLONASE) 50 MCG/ACT nasal spray Place 1 spray into both nostrils daily. 03/08/19  Yes Caccavale, Sophia, PA-C  metoprolol succinate (TOPROL-XL) 50 MG 24 hr tablet Take 1 tablet (50 mg  total) by mouth daily. Take with or immediately following a meal. 07/08/20  Yes Chandrasekhar, Mahesh A, MD  omeprazole (PRILOSEC) 20 MG capsule TAKE 1 CAPSULE BY MOUTH EVERY DAY 10/20/19  Yes Myrlene Broker, MD  simvastatin (ZOCOR) 20 MG tablet TAKE 1 TABLET BY MOUTH EVERYDAY AT BEDTIME 04/25/20  Yes Myrlene Broker, MD  albuterol (PROVENTIL) (2.5 MG/3ML) 0.083% nebulizer solution Take 3 mLs (2.5 mg total) by nebulization every 6 (six) hours as needed for wheezing or shortness of breath. 06/04/19   Hall-Potvin, [place], PA-C    Family History Family History  Problem Relation Age of Onset   Heart failure Mother    Osteoporosis Mother    Hyperlipidemia Mother    Hypertension Mother    Heart disease Father    Clotting disorder Father    Liver cancer Brother    Liver disease Maternal Aunt        x2   Colon cancer Neg Hx     Social History Social History   Tobacco Use   Smoking status: Never Smoker   Smokeless tobacco: Never Used  Building services engineer Use: Never used  Substance Use Topics   Alcohol use: No   Drug use: No     Allergies   Atenolol, Avelox [moxifloxacin hcl in nacl], Azithromycin, Doxycycline, Myrbetriq [mirabegron], and Oxybutynin   Review of Systems Review of Systems  Constitutional: Positive for fever. Negative for activity change, appetite change, chills and fatigue.  HENT: Positive for congestion and rhinorrhea. Negative for ear pain, sinus pressure, sore throat and trouble swallowing.   Eyes: Negative for discharge and redness.  Respiratory: Positive for cough. Negative for chest tightness and shortness of breath.   Cardiovascular: Negative for chest pain.  Gastrointestinal: Negative for abdominal pain, diarrhea, nausea and vomiting.  Musculoskeletal: Negative for myalgias.  Skin: Negative for rash.  Neurological: Negative for dizziness, light-headedness and headaches.     Physical Exam Triage Vital Signs ED Triage Vitals  [07/20/20 0939]  Enc Vitals Group     BP (!) 142/81     Pulse Rate (!) 108     Resp 20     Temp 98.8 F (37.1 C)     Temp Source Oral     SpO2 97 %     Weight      Height      Head Circumference      Peak Flow      Pain Score      Pain Loc      Pain Edu?      Excl. in GC?    No data found.  Updated Vital Signs BP (!) 142/81 (  BP Location: Left Arm)    Pulse (!) 108    Temp 98.8 F (37.1 C) (Oral)    Resp 20    LMP 07/23/2004    SpO2 97%   Visual Acuity Right Eye Distance:   Left Eye Distance:   Bilateral Distance:    Right Eye Near:   Left Eye Near:    Bilateral Near:     Physical Exam Vitals and nursing note reviewed.  Constitutional:      Appearance: She is well-developed and well-nourished.     Comments: No acute distress  HENT:     Head: Normocephalic and atraumatic.     Ears:     Comments: Bilateral ears without tenderness to palpation of external auricle, tragus and mastoid, EAC's without erythema or swelling, TM's with good bony landmarks and cone of light. Non erythematous.     Nose: Nose normal.     Mouth/Throat:     Comments: Oral mucosa pink and moist, no tonsillar enlargement or exudate. Posterior pharynx patent and nonerythematous, no uvula deviation or swelling. Normal phonation. Eyes:     Conjunctiva/sclera: Conjunctivae normal.  Cardiovascular:     Rate and Rhythm: Rhythm irregular.     Heart sounds: Murmur heard.    Pulmonary:     Effort: Pulmonary effort is normal. No respiratory distress.     Comments: Breathing comfortably at rest, CTABL, no wheezing, rales or other adventitious sounds auscultated Abdominal:     General: There is no distension.  Musculoskeletal:        General: Normal range of motion.     Cervical back: Neck supple.  Skin:    General: Skin is warm and dry.  Neurological:     Mental Status: She is alert and oriented to person, place, and time.  Psychiatric:        Mood and Affect: Mood and affect normal.      UC  Treatments / Results  Labs (all labs ordered are listed, but only abnormal results are displayed) Labs Reviewed  NOVEL CORONAVIRUS, NAA    EKG   Radiology No results found.  Procedures Procedures (including critical care time)  Medications Ordered in UC Medications - No data to display  Initial Impression / Assessment and Plan / UC Course  I have reviewed the triage vital signs and the nursing notes.  Pertinent labs & imaging results that were available during my care of the patient were reviewed by me and considered in my medical decision making (see chart for details).     Covid test pending, recommending continued symptomatic and supportive care of symptoms while awaiting Covid test, if negative did provide prescription for Augmentin to treat sinusitis if still having pressure and fevers.  Discussed strict return precautions. Patient verbalized understanding and is agreeable with plan.  Final Clinical Impressions(s) / UC Diagnoses   Final diagnoses:  Encounter for screening for COVID-19  Viral URI with cough     Discharge Instructions     Covid test pending, monitor my chart for results Rest and fluids Continue Tylenol for headache, body aches Continue Nasacort Tessalon/benzonatate for cough May fill prescription for Augmentin if Covid test negative and still having sinus pressure and fevers   Return if not improving or worsening    ED Prescriptions    Medication Sig Dispense Auth. Provider   amoxicillin-clavulanate (AUGMENTIN) 875-125 MG tablet Take 1 tablet by mouth every 12 (twelve) hours for 7 days. 14 tablet Valina Maes, [place] C, PA-C  PDMP not reviewed this encounter.   Brenner Visconti, [place] C, PA-C 07/20/20 1100

## 2020-07-20 NOTE — ED Triage Notes (Signed)
Patient states she has had sinus drainage and cough/congestion as well as fevers x 4 days. Pt is aox4 and ambulatory.

## 2020-07-20 NOTE — Discharge Instructions (Signed)
Covid test pending, monitor my chart for results Rest and fluids Continue Tylenol for headache, body aches Continue Nasacort Tessalon/benzonatate for cough May fill prescription for Augmentin if Covid test negative and still having sinus pressure and fevers   Return if not improving or worsening

## 2020-07-21 LAB — NOVEL CORONAVIRUS, NAA: SARS-CoV-2, NAA: NOT DETECTED

## 2020-07-21 LAB — SARS-COV-2, NAA 2 DAY TAT

## 2020-07-25 ENCOUNTER — Other Ambulatory Visit (HOSPITAL_COMMUNITY)
Admission: RE | Admit: 2020-07-25 | Discharge: 2020-07-25 | Disposition: A | Discharge status: Home / Self Care | Payer: Medicare Other | Source: Ambulatory Visit | Attending: Internal Medicine | Admitting: Internal Medicine

## 2020-07-25 DIAGNOSIS — Z20822 Contact with and (suspected) exposure to covid-19: Secondary | ICD-10-CM | POA: Insufficient documentation

## 2020-07-25 DIAGNOSIS — Z01812 Encounter for preprocedural laboratory examination: Secondary | ICD-10-CM | POA: Diagnosis present

## 2020-07-25 LAB — SARS CORONAVIRUS 2 (TAT 6-24 HRS): SARS Coronavirus 2: NEGATIVE

## 2020-07-27 NOTE — Anesthesia Preprocedure Evaluation (Addendum)
Anesthesia Evaluation  Patient identified by MRN, date of birth, ID band Patient awake    Reviewed: Allergy & Precautions, H&P , NPO status , Patient's Chart, lab work & pertinent test results  Airway Mallampati: II  TM Distance: >3 FB Neck ROM: Full    Dental  (+) Dental Advisory Given   Pulmonary neg pulmonary ROS,    Pulmonary exam normal breath sounds clear to auscultation       Cardiovascular hypertension, Pt. on medications and Pt. on home beta blockers  Rhythm:Irregular Rate:Normal     Neuro/Psych PSYCHIATRIC DISORDERS Depression negative neurological ROS     GI/Hepatic Neg liver ROS, GERD  ,  Endo/Other  Morbid obesity  Renal/GU negative Renal ROS     Musculoskeletal  (+) Arthritis , Osteoarthritis,    Abdominal (+) + obese,   Peds  Hematology negative hematology ROS (+)   Anesthesia Other Findings   Reproductive/Obstetrics negative OB ROS                            Anesthesia Physical  Anesthesia Plan  ASA: III  Anesthesia Plan: General   Post-op Pain Management:    Induction: Intravenous  PONV Risk Score and Plan: 3 and Propofol infusion, Treatment may vary due to age or medical condition and TIVA  Airway Management Planned: Mask  Additional Equipment: None  Intra-op Plan:   Post-operative Plan:   Informed Consent: I have reviewed the patients History and Physical, chart, labs and discussed the procedure including the risks, benefits and alternatives for the proposed anesthesia with the patient or authorized representative who has indicated his/her understanding and acceptance.     Dental advisory given  Plan Discussed with: CRNA  Anesthesia Plan Comments:        Anesthesia Quick Evaluation

## 2020-07-27 NOTE — Progress Notes (Signed)
Pre op call done for cardioversion tomorrow 07/28/20. Patient states she has been quarantined since covid test, has not missed any doses of her blood thinner and will take morning of procedure, will be NPO morning of, and she confirmed she has a driver home. All questions addressed.

## 2020-07-28 ENCOUNTER — Ambulatory Visit (HOSPITAL_COMMUNITY): Payer: Medicare Other | Admitting: Anesthesiology

## 2020-07-28 ENCOUNTER — Encounter (HOSPITAL_COMMUNITY): Payer: Self-pay | Admitting: Internal Medicine

## 2020-07-28 ENCOUNTER — Encounter (HOSPITAL_COMMUNITY)
Admission: RE | Disposition: A | Discharge status: Home / Self Care | Payer: Self-pay | Source: Home / Self Care | Attending: Internal Medicine

## 2020-07-28 ENCOUNTER — Other Ambulatory Visit: Payer: Self-pay

## 2020-07-28 ENCOUNTER — Ambulatory Visit (HOSPITAL_COMMUNITY)
Admission: RE | Admit: 2020-07-28 | Discharge: 2020-07-28 | Disposition: A | Discharge status: Home / Self Care | Payer: Medicare Other | Attending: Internal Medicine | Admitting: Internal Medicine

## 2020-07-28 DIAGNOSIS — E782 Mixed hyperlipidemia: Secondary | ICD-10-CM | POA: Insufficient documentation

## 2020-07-28 DIAGNOSIS — I700 Atherosclerosis of aorta: Secondary | ICD-10-CM | POA: Insufficient documentation

## 2020-07-28 DIAGNOSIS — R400 Somnolence: Secondary | ICD-10-CM | POA: Diagnosis not present

## 2020-07-28 DIAGNOSIS — I10 Essential (primary) hypertension: Secondary | ICD-10-CM | POA: Insufficient documentation

## 2020-07-28 DIAGNOSIS — I4819 Other persistent atrial fibrillation: Secondary | ICD-10-CM | POA: Insufficient documentation

## 2020-07-28 DIAGNOSIS — Z6841 Body Mass Index (BMI) 40.0 and over, adult: Secondary | ICD-10-CM | POA: Insufficient documentation

## 2020-07-28 HISTORY — PX: CARDIOVERSION: SHX1299

## 2020-07-28 SURGERY — CARDIOVERSION
Anesthesia: General

## 2020-07-28 MED ORDER — LIDOCAINE 2% (20 MG/ML) 5 ML SYRINGE
INTRAMUSCULAR | Status: DC | PRN
  Administered 2020-07-28: 100 mg via INTRAVENOUS

## 2020-07-28 MED ORDER — PHENYLEPHRINE 40 MCG/ML (10ML) SYRINGE FOR IV PUSH (FOR BLOOD PRESSURE SUPPORT)
PREFILLED_SYRINGE | INTRAVENOUS | Status: DC | PRN
  Administered 2020-07-28: 40 ug via INTRAVENOUS

## 2020-07-28 MED ORDER — METOPROLOL SUCCINATE ER 25 MG PO TB24: 25.0000 mg | ORAL_TABLET | Freq: Every day | ORAL | 3 refills | Status: DC

## 2020-07-28 MED ORDER — PROPOFOL 10 MG/ML IV BOLUS
INTRAVENOUS | Status: DC | PRN
  Administered 2020-07-28: 80 mg via INTRAVENOUS

## 2020-07-28 MED ORDER — SODIUM CHLORIDE 0.9 % IV SOLN: INTRAVENOUS | Status: DC

## 2020-07-28 NOTE — Transfer of Care (Signed)
Immediate Anesthesia Transfer of Care Note  Patient: Amy Williamson  Procedure(s) Performed: CARDIOVERSION (N/A )  Patient Location: PACU and Endoscopy Unit  Anesthesia Type:General  Level of Consciousness: awake, patient cooperative and responds to stimulation  Airway & Oxygen Therapy: Patient Spontanous Breathing  Post-op Assessment: Report given to RN and Post -op Vital signs reviewed and stable  Post vital signs: Reviewed and stable  Last Vitals:  Vitals Value Taken Time  BP 125/62 07/28/20 0811  Temp    Pulse 66 07/28/20 0813  Resp 15 07/28/20 0813  SpO2 96 % 07/28/20 0813    Last Pain:  Vitals:   07/28/20 0706  TempSrc: Temporal  PainSc: 0-No pain         Complications: No complications documented.

## 2020-07-28 NOTE — CV Procedure (Signed)
   Electrical Cardioversion Procedure Note Amy Williamson [id number] [birth year]  Procedure: Electrical Cardioversion Indications:  Atrial Fibrillation  Time Out: Verified patient identification, verified procedure,medications/allergies/relevent history reviewed, required imaging and test results available.  Performed  Procedure Details  The patient was NPO after midnight. Anesthesia was administered at the beside  by Dr.Germeroth with 80mg  of lidocaine and 100 mg propofol.  Cardioversion was done with synchronized biphasic defibrillation with AP pads with 200 Joules- two attempts were made.  The patient converted to Amy sinus bradycardia that progressed to sinus rhythm. The patient tolerated the procedure well   IMPRESSION:  Successful cardioversion of atrial fibrillation    Amy Williamson Amy Williamson 07/28/2020, 8:12 AM

## 2020-07-28 NOTE — Interval H&P Note (Signed)
History and Physical Interval Note:  07/28/2020 7:54 AM  Amy Williamson  has presented today for surgery, with the diagnosis of AFIB.  The various methods of treatment have been discussed with the patient and family. After consideration of risks, benefits and other options for treatment, the patient has consented to  Procedure(s): CARDIOVERSION (N/A) as a surgical intervention.  The patient's history has been reviewed, patient examined, no change in status, stable for surgery.  I have reviewed the patient's chart and labs.  Questions were answered to the patient's satisfaction.     Matalie Romberger A Reana Chacko

## 2020-07-29 NOTE — Anesthesia Postprocedure Evaluation (Signed)
Anesthesia Post Note  Patient: Amy Williamson  Procedure(s) Performed: CARDIOVERSION (N/A )     Patient location during evaluation: PACU Anesthesia Type: General Level of consciousness: sedated and patient cooperative Pain management: pain level controlled Vital Signs Assessment: post-procedure vital signs reviewed and stable Respiratory status: spontaneous breathing Cardiovascular status: stable Anesthetic complications: no   No complications documented.  Last Vitals:  Vitals:   07/28/20 0815 07/28/20 0825  BP: (!) 126/58 (!) 145/94  Pulse: 68 73  Resp: 17 (!) 22  Temp: 36.6 C   SpO2: 96% 98%    Last Pain:  Vitals:   07/28/20 0825  TempSrc:   PainSc: 0-No pain                 Nolon Nations

## 2020-08-01 ENCOUNTER — Encounter: Payer: Self-pay | Admitting: Internal Medicine

## 2020-08-01 ENCOUNTER — Other Ambulatory Visit: Payer: Self-pay

## 2020-08-01 ENCOUNTER — Ambulatory Visit: Payer: Medicare Other | Admitting: Internal Medicine

## 2020-08-01 DIAGNOSIS — I4891 Unspecified atrial fibrillation: Secondary | ICD-10-CM | POA: Diagnosis not present

## 2020-08-01 NOTE — Progress Notes (Signed)
   Subjective:   Patient ID: Amy Williamson, female    DOB: 30-Dec-1950, 70 y.o.   MRN: [id number]  HPI The patient is a 70 YO female coming in for follow up new A fib (referred to cardiology and they did cardioversion about 4 days ago, she is taking metoprolol and eliquis without any problems). Denies bleeding or blood in stool. Denies easy bruising. Has not felt palpitations since procedure last week. Does feel more energy since the cardioversion. Denies fevers or chills. Denies chest pains. Stable mild SOB with exertion.  She is getting sleep study soon to evaluate for OSA.  Review of Systems  Constitutional: Negative.   HENT: Negative.   Eyes: Negative.   Respiratory: Negative for cough, chest tightness and shortness of breath.   Cardiovascular: Negative for chest pain, palpitations and leg swelling.  Gastrointestinal: Negative for abdominal distention, abdominal pain, constipation, diarrhea, nausea and vomiting.  Musculoskeletal: Negative.   Skin: Negative.   Neurological: Negative.   Psychiatric/Behavioral: Negative.     Objective:  Physical Exam Constitutional:      Appearance: She is well-developed and well-nourished. She is obese.  HENT:     Head: Normocephalic and atraumatic.  Eyes:     Extraocular Movements: EOM normal.  Cardiovascular:     Rate and Rhythm: Normal rate and regular rhythm.  Pulmonary:     Effort: Pulmonary effort is normal. No respiratory distress.     Breath sounds: Normal breath sounds. No wheezing or rales.  Abdominal:     General: Bowel sounds are normal. There is no distension.     Palpations: Abdomen is soft.     Tenderness: There is no abdominal tenderness. There is no rebound.  Musculoskeletal:        General: No edema.     Cervical back: Normal range of motion.  Skin:    General: Skin is warm and dry.  Neurological:     Mental Status: She is alert and oriented to person, place, and time.     Coordination: Coordination normal.   Psychiatric:        Mood and Affect: Mood and affect normal.     Vitals:   08/01/20 0843  BP: 138/72  Pulse: 70  Temp: 98.9 F (37.2 C)  TempSrc: Oral  SpO2: 98%  Weight: 214 lb 12.8 oz (97.4 kg)   This visit occurred during the SARS-CoV-2 public health emergency.  Safety protocols were in place, including screening questions prior to the visit, additional usage of staff PPE, and extensive cleaning of exam room while observing appropriate contact time as indicated for disinfecting solutions.   Assessment & Plan:  Visit time 25 minutes in face to face communication with patient and coordination of care, additional 5 minutes spent in record review, coordination or care, ordering tests, communicating/referring to other healthcare professionals, documenting in medical records all on the same day of the visit for total time 30 minutes spent on the visit.

## 2020-08-01 NOTE — Patient Instructions (Addendum)
You can take claritin or allegra or zyrtec for the drainage.   We will get you in with a nutritionist.

## 2020-08-03 NOTE — Assessment & Plan Note (Signed)
Recent cardioversion and sounds regular on exam today. Follow up with cardiology next month. Continue metoprolol and eliquis. HR at goal.

## 2020-08-03 NOTE — Assessment & Plan Note (Addendum)
Discussed diet with her and increasing exercise now that she has more energy to help reduce weight. She will be screened for OSA and she does not feel capable of using CPAP even if recommended due to claustrophobia. Referral to nutrition to help with dietary recommendations.

## 2020-08-10 ENCOUNTER — Other Ambulatory Visit: Payer: Self-pay

## 2020-08-10 ENCOUNTER — Ambulatory Visit (HOSPITAL_COMMUNITY): Payer: Medicare Other | Attending: Cardiovascular Disease

## 2020-08-10 DIAGNOSIS — I4819 Other persistent atrial fibrillation: Secondary | ICD-10-CM | POA: Diagnosis present

## 2020-08-10 LAB — ECHOCARDIOGRAM COMPLETE
Area-P 1/2: 3.12 cm2
S' Lateral: 3.2 cm

## 2020-09-03 ENCOUNTER — Ambulatory Visit (HOSPITAL_BASED_OUTPATIENT_CLINIC_OR_DEPARTMENT_OTHER): Payer: Medicare Other | Attending: Internal Medicine | Admitting: Cardiology

## 2020-09-03 DIAGNOSIS — G4733 Obstructive sleep apnea (adult) (pediatric): Secondary | ICD-10-CM | POA: Diagnosis present

## 2020-09-03 DIAGNOSIS — R400 Somnolence: Secondary | ICD-10-CM

## 2020-09-05 ENCOUNTER — Other Ambulatory Visit (HOSPITAL_BASED_OUTPATIENT_CLINIC_OR_DEPARTMENT_OTHER): Payer: Self-pay

## 2020-09-05 DIAGNOSIS — R400 Somnolence: Secondary | ICD-10-CM

## 2020-09-05 NOTE — Procedures (Signed)
   Patient Name: Amy Williamson, Amy Williamson Date: 09/03/2020 Gender: Female D.O.B: 05-14-1951 Age (years): 57 Referring Provider: Rudean Haskell Height (inches): 61 Interpreting Physician: Fransico Him MD, ABSM Weight (lbs): 215 RPSGT: Earney Hamburg BMI: 41 MRN: [id number] Neck Size: 15.00  CLINICAL INFORMATION Sleep Study Type: NPSG  Indication for sleep study: N/A  Epworth Sleepiness Score: 7  SLEEP STUDY TECHNIQUE As per the AASM Manual for the Scoring of Sleep and Associated Events v2.3 (April 2016) with a hypopnea requiring 4% desaturations.  The channels recorded and monitored were frontal, central and occipital EEG, electrooculogram (EOG), submentalis EMG (chin), nasal and oral airflow, thoracic and abdominal wall motion, anterior tibialis EMG, snore microphone, electrocardiogram, and pulse oximetry.  MEDICATIONS Medications self-administered by patient taken the night of the study : METOPROLOL, SIMVASTATIN, TYLENOL PM  SLEEP ARCHITECTURE The study was initiated at 10:02:39 PM and ended at 4:05:23 AM.  Sleep onset time was 47.4 minutes and the sleep efficiency was 35.1%. The total sleep time was 127.5 minutes.  Stage REM latency was 163.0 minutes.  The patient spent 12.2% of the night in stage N1 sleep, 82.7% in stage N2 sleep, 0.0% in stage N3 and 5.1% in REM.  Alpha intrusion was absent.  Supine sleep was 38.04%.  RESPIRATORY PARAMETERS The overall apnea/hypopnea index (AHI) was 22.1 per hour. There were 18 total apneas, including 18 obstructive, 0 central and 0 mixed apneas. There were 29 hypopneas and 30 RERAs.  The AHI during Stage REM sleep was 46.2 per hour.  AHI while supine was 45.8 per hour.  The mean oxygen saturation was 93.0%. The minimum SpO2 during sleep was 70.0%.  loud snoring was noted during this study.  CARDIAC DATA The 2 lead EKG demonstrated sinus rhythm. The mean heart rate was 69.7 beats per minute. Other EKG findings include:  PACs.  LEG MOVEMENT DATA The total PLMS were 0 with a resulting PLMS index of 0.0. Associated arousal with leg movement index was 1.4 .  IMPRESSIONS - Moderate obstructive sleep apnea occurred during this study (AHI = 22.1/h). - No significant central sleep apnea occurred during this study (CAI = 0.0/h). - Severe oxygen desaturation was noted during this study (Min O2 = 70.0%). - The patient snored with loud snoring volume. - PACs were noted during this study. - Clinically significant periodic limb movements did not occur during sleep. No significant associated arousals.  DIAGNOSIS - Obstructive Sleep Apnea (G47.33)  RECOMMENDATIONS - Therapeutic CPAP titration to determine optimal pressure required to alleviate sleep disordered breathing. - Positional therapy avoiding supine position during sleep. - Avoid alcohol, sedatives and other CNS depressants that may worsen sleep apnea and disrupt normal sleep architecture. - Sleep hygiene should be reviewed to assess factors that may improve sleep quality. - Weight management and regular exercise should be initiated or continued if appropriate.  [Electronically signed] 09/05/2020 11:38 PM  Fransico Him MD, ABSM Diplomate, American Board of Sleep Medicine

## 2020-09-13 ENCOUNTER — Other Ambulatory Visit: Payer: Self-pay

## 2020-09-13 ENCOUNTER — Ambulatory Visit: Payer: Medicare Other | Admitting: Internal Medicine

## 2020-09-13 ENCOUNTER — Encounter: Payer: Self-pay | Admitting: Internal Medicine

## 2020-09-13 VITALS — BP 128/80 | HR 70 | Ht 60.5 in | Wt 214.0 lb

## 2020-09-13 DIAGNOSIS — I10 Essential (primary) hypertension: Secondary | ICD-10-CM | POA: Diagnosis not present

## 2020-09-13 DIAGNOSIS — G4733 Obstructive sleep apnea (adult) (pediatric): Secondary | ICD-10-CM

## 2020-09-13 DIAGNOSIS — I4819 Other persistent atrial fibrillation: Secondary | ICD-10-CM | POA: Diagnosis not present

## 2020-09-13 DIAGNOSIS — E782 Mixed hyperlipidemia: Secondary | ICD-10-CM

## 2020-09-13 NOTE — Patient Instructions (Signed)
Medication Instructions:  Your physician has recommended you make the following change in your medication:   STOP: metoprolol XL *If you need a refill on your cardiac medications before your next appointment, please call your pharmacy*   Lab Work: IN 6 MONTHS: lipid panel and LFT If you have labs (blood work) drawn today and your tests are completely normal, you will receive your results only by: [name] MyChart Message (if you have MyChart) OR . A paper copy in the mail If you have any lab test that is abnormal or we need to change your treatment, we will call you to review the results.   Testing/Procedures: NONE   Follow-Up: At [facility], you and your health needs are our priority.  As part of our continuing mission to provide you with exceptional heart care, we have created designated Provider Care Teams.  These Care Teams include your primary Cardiologist (physician) and Advanced Practice Providers (APPs -  Physician Assistants and Nurse Practitioners) who all work together to provide you with the care you need, when you need it.  We recommend signing up for the patient portal called "MyChart".  Sign up information is provided on this After Visit Summary.  MyChart is used to connect with patients for Virtual Visits (Telemedicine).  Patients are able to view lab/test results, encounter notes, upcoming appointments, etc.  Non-urgent messages can be sent to your provider as well.   To learn more about what you can do with MyChart, go to [email or web address].    Your next appointment:   6 month(s)  The format for your next appointment:   In Person  Provider:   You may see Gasper Sells, MD or one of the following Advanced Practice Providers on your designated Care Team:    Melina Copa, PA-C  Ermalinda Barrios, PA-C

## 2020-09-13 NOTE — Progress Notes (Signed)
Cardiology Office Note:    Date:  09/13/2020   ID:  Amy Williamson, DOB October 31, 1950, MRN [id number]  PCP:  Hoyt Koch, MD  [facility] Cardiologist:  Rudean Haskell MD [place] Electrophysiologist:  None   CC: Atrial Fibrillation Follow up  History of Present Illness:    Amy Williamson is a 70 y.o. female with a hx of new atrial fibrillation, with HTN, Aortic Atherosclerosis and HLD, Morbid Obesity, history of dysphagia who sees ([id number]); prior lumpectomy in 06/09/2020 who presented for evaluation 07/08/20.  In interval had 07/28/20 DCCV.  Has also seen Dr. Radford Pax for OSA.  Patient notes that she is doing some fatigue.  Since last visit notes some depression and feeling lack of energy changes. There are no interval hospital/ED visit.   No chest pain or pressure .  No SOB/DOE and no PND/Orthopnea.  No weight gain or leg swelling.  No palpitations or syncope; still feels heart in her ears.  Patient notes that during her sleep study, she had issues with nasal congestion and feels anxious with her sleep mask.  Her husband does well with his mask but she has questions about optimizing her sleep treatment.   Past Medical History:  Diagnosis Date  . Arthritis    BACK AND SHOULDERS  . Bronchitis 07-14-11  . Cataract immature BILATERAL  . Depressive disorder, not elsewhere classified   . Dysphagia   . History of esophageal reflux   . History of migraine headaches   . History of pneumonia 05-14-11  . Obesity, unspecified   . Palpitations   . Plantar fasciitis 08/2017   left foot   . Pure hypercholesterolemia   . Unspecified essential hypertension   . Unspecified venous (peripheral) insufficiency     Past Surgical History:  Procedure Laterality Date  . BREAST LUMPECTOMY WITH RADIOACTIVE SEED LOCALIZATION Left 06/09/2020   Procedure: LEFT BREAST LUMPECTOMY WITH RADIOACTIVE SEED LOCALIZATION;  Surgeon: Coralie Keens, MD;  Location: [place];  Service: General;  Laterality: Left;  LMA  . CARDIOVERSION N/A 07/28/2020   Procedure: CARDIOVERSION;  Surgeon: Werner Lean, MD;  Location: [place] [facility];  Service: Cardiovascular;  Laterality: N/A;  . CARPAL TUNNEL RELEASE Right 2004  . CARPAL TUNNEL RELEASE Left 2006  . CESAREAN SECTION  [id number]  . EAR CYST EXCISION  08/03/2011   patient states this is incorrect  . KNEE ARTHROSCOPY    . KNEE ARTHROSCOPY W/ MENISCAL REPAIR  08/03/2011   with popliteal cyst excition  . KNEE ARTHROSCOPY WITH EXCISION BAKER'S CYST    . PULLEY RELEASE RIGHT TRIGGER FINGER Right 01/08/2011  . TRIGGER FINGER RELEASE Right 04/20/13  . TRIGGER FINGER RELEASE Left 05/06/14  . TUBAL LIGATION  1977   with second cesarean section    Current Medications: Current Meds  Medication Sig  . amLODipine (NORVASC) 5 MG tablet TAKE 1 TABLET BY MOUTH EVERY DAY  . apixaban (ELIQUIS) 5 MG TABS tablet Take 1 tablet (5 mg total) by mouth 2 (two) times daily.  [name] omeprazole (PRILOSEC) 20 MG capsule TAKE 1 CAPSULE BY MOUTH EVERY DAY  . simvastatin (ZOCOR) 20 MG tablet TAKE 1 TABLET BY MOUTH EVERYDAY AT BEDTIME  . triamcinolone (NASACORT) 55 MCG/ACT AERO nasal inhaler Place 2 sprays into the nose daily as needed (allergies).  . [DISCONTINUED] metoprolol succinate (TOPROL XL) 25 MG 24 hr tablet Take 1 tablet (25 mg total) by mouth daily.     Allergies:   Atenolol, Avelox [moxifloxacin hcl in  nacl], Azithromycin, Doxycycline, Myrbetriq [mirabegron], and Oxybutynin   Social History   Socioeconomic History  . Marital status: Married    Spouse name: Amy Williamson  . Number of children: 2  . Years of education: Not on file  . Highest education level: Not on file  Occupational History  . Occupation: retired  Tobacco Use  . Smoking status: Never Smoker  . Smokeless tobacco: Never Used  Vaping Use  . Vaping Use: Never used  Substance and Sexual Activity  . Alcohol use: No  . Drug use: No  . Sexual activity: Not  Currently    Comment: BTL  Other Topics Concern  . Not on file  Social History Narrative  . Not on file   Social Determinants of Health   Financial Resource Strain: Not on file  Food Insecurity: Not on file  Transportation Needs: Not on file  Physical Activity: Not on file  Stress: Not on file  Social Connections: Not on file    Social:  I take care of her nephew  Family History: The patient's family history includes Clotting disorder in her father; Heart disease in her father; Heart failure in her mother; Hyperlipidemia in her mother; Hypertension in her mother; Liver cancer in her brother; Liver disease in her maternal aunt; Osteoporosis in her mother. There is no history of Colon cancer. History of coronary artery disease notable for no members. History of heart failure notable for mother. No history of cardiomyopathies including hypertrophic cardiomyopathy, left ventricular non-compaction, or arrhythmogenic right ventricular cardiomyopathy. History of arrhythmia notable for AF in mother. Denies family history of sudden cardiac death including drowning, car accidents, or unexplained deaths in the family. No history of bicuspid aortic valve or aortic aneurysm or dissection.  ROS:   Please see the history of present illness.     All other systems reviewed and are negative.  EKGs/Labs/Other Studies Reviewed:    The following studies were reviewed today:  EKG:  09/13/2020: SR rate 61 WNL 06/30/20:  A fib 70   Transthoracic Echocardiogram: Date: 08/10/2020 Results: IMPRESSIONS  1. Left ventricular ejection fraction, by estimation, is 60 to 65%. The  left ventricle has normal function. The left ventricle has no regional  wall motion abnormalities. Left ventricular diastolic parameters are  consistent with Grade II diastolic  dysfunction (pseudonormalization). Elevated left ventricular end-diastolic  pressure.  2. Right ventricular systolic function is normal. The right  ventricular  size is normal.  3. The mitral valve is normal in structure. Trivial mitral valve  regurgitation. No evidence of mitral stenosis.  4. The aortic valve is tricuspid. Aortic valve regurgitation is not  visualized. No aortic stenosis is present.  5. The inferior vena cava is normal in size with greater than 50%  respiratory variability, suggesting right atrial pressure of 3 mmHg.   NonCardiac CT: Date: 09/23/2013 Results: No coronary calcification; Aortic Arch Calcifications  Recent Labs: 06/30/2020: ALT 17; TSH 2.22 07/08/2020: BUN 18; Creatinine, Ser 0.86; Hemoglobin 13.2; Platelets 231; Potassium 4.3; Sodium 139  Recent Lipid Panel    Component Value Date/Time   CHOL 177 06/30/2020 0903   TRIG 116.0 06/30/2020 0903   TRIG 54 06/25/2006 0725   HDL 61.40 06/30/2020 0903   CHOLHDL 3 06/30/2020 0903   VLDL 23.2 06/30/2020 0903   LDLCALC 92 06/30/2020 0903   LDLDIRECT 142.3 03/16/2013 0732    Risk Assessment/Calculations:     CHA2DS2-VASc Score = 3  This indicates a 3.2% annual risk of stroke. The patient's score is  based upon: CHF History: No HTN History: Yes Diabetes History: No Stroke History: No Vascular Disease History: No Age Score: 1 Gender Score: 1     HASBLED 3: Risk was 5.8% in one validation study (Lip 2011) and 3.72 bleeds per 100 patient-years in another validation study (Pisters 2010).  Physical Exam:    VS:  BP 128/80   Pulse 70   Ht 5' 0.5" (1.537 m)   Wt 214 lb (97.1 kg)   LMP 07/23/2004   BMI 41.11 kg/m     Wt Readings from Last 3 Encounters:  09/13/20 214 lb (97.1 kg)  09/03/20 215 lb (97.5 kg)  08/01/20 214 lb 12.8 oz (97.4 kg)    GEN: Obese, well developed in no acute distress HEENT: Normal NECK: No JVD; No carotid bruits LYMPHATICS: No lymphadenopathy CARDIAC: regular rate and rhythm, no murmurs, rubs, gallops RESPIRATORY:  Clear to auscultation without rales, wheezing or rhonchi  ABDOMEN: Soft, non-tender,  non-distended MUSCULOSKELETAL:  No edema; No deformity  SKIN: Warm and dry NEUROLOGIC:  Alert and oriented x 3 PSYCHIATRIC:  Normal affect   ASSESSMENT:    1. Persistent atrial fibrillation ([place])   2. OSA (obstructive sleep apnea)   3. Morbid obesity ([place])   4. Essential hypertension   5. Mixed hyperlipidemia    PLAN:    In order of problems listed above:  Persistent Atrial Fibrillation s/p Cardioversion OSA - with issues with sleep mask Morbid Obesity - CHADSVASC=3, HASBLED 3. - TSH 2.22 - discussed alcohol and exercise as preventive factors - Continue anticoagulation with Eliquis (no low dose given creatinine and and weight) - - will come off metoprolol and if needed for palpitations will stop norvasc and start diltiazem  Essential Hypertension - ambulatory blood pressure at goal, will continue ambulatory BP monitoring; gave education on how to perform ambulatory blood pressure monitoring including the frequency and technique; goal ambulatory blood pressure < 135/85 on average - continue home medications with the exception of the above - discussed diet (DASH/low sodium), and exercise/weight loss interventions   Hyperlipidemia -LDL goal less than 100 -continue current statin -Recheck lipid profile LFTs at next visit - gave education on dietary changes  Six months  follow up unless new symptoms or abnormal test results warranting change in plan  Would be reasonable for  APP Follow up   Medication Adjustments/Labs and Tests Ordered: Current medicines are reviewed at length with the patient today.  Concerns regarding medicines are outlined above.  Orders Placed This Encounter  Procedures  . Hepatic function panel  . Lipid panel  . EKG 12-Lead   No orders of the defined types were placed in this encounter.   Patient Instructions  Medication Instructions:  Your physician has recommended you make the following change in your medication:   STOP: metoprolol XL *If  you need a refill on your cardiac medications before your next appointment, please call your pharmacy*   Lab Work: IN 6 MONTHS: lipid panel and LFT If you have labs (blood work) drawn today and your tests are completely normal, you will receive your results only by: [name] MyChart Message (if you have MyChart) OR . A paper copy in the mail If you have any lab test that is abnormal or we need to change your treatment, we will call you to review the results.   Testing/Procedures: NONE   Follow-Up: At [facility], you and your health needs are our priority.  As part of our continuing mission to provide you with  exceptional heart care, we have created designated Provider Care Teams.  These Care Teams include your primary Cardiologist (physician) and Advanced Practice Providers (APPs -  Physician Assistants and Nurse Practitioners) who all work together to provide you with the care you need, when you need it.  We recommend signing up for the patient portal called "MyChart".  Sign up information is provided on this After Visit Summary.  MyChart is used to connect with patients for Virtual Visits (Telemedicine).  Patients are able to view lab/test results, encounter notes, upcoming appointments, etc.  Non-urgent messages can be sent to your provider as well.   To learn more about what you can do with MyChart, go to [email or web address].    Your next appointment:   6 month(s)  The format for your next appointment:   In Person  Provider:   You may see Gasper Sells, MD or one of the following Advanced Practice Providers on your designated Care Team:    Melina Copa, PA-C  Ermalinda Barrios, PA-C          Signed, Werner Lean, MD  09/13/2020 9:57 AM    [place]

## 2020-09-22 ENCOUNTER — Telehealth: Payer: Self-pay | Admitting: *Deleted

## 2020-09-22 DIAGNOSIS — G4733 Obstructive sleep apnea (adult) (pediatric): Secondary | ICD-10-CM

## 2020-09-22 NOTE — Telephone Encounter (Signed)
-----   Message from Sueanne Margarita, MD sent at 09/05/2020 11:40 PM EST ----- Please let patient know that they have sleep apnea and recommend CPAP titration. Please set up titration in the sleep lab.

## 2020-09-22 NOTE — Telephone Encounter (Signed)
Informed patient of sleep study results and patient understanding was verbalized. Patient understands her sleep study showed they have sleep apnea and recommend CPAP titration. Please set up titration in the sleep lab Pt is aware of normal results but states she cannot wear a mask on her face because she is claustrophobic. Patient has asked for a video visit with dr Radford Pax to discuss her results. Patient has an appointment for 09/23/20 at 8:40.

## 2020-09-23 ENCOUNTER — Telehealth (INDEPENDENT_AMBULATORY_CARE_PROVIDER_SITE_OTHER): Payer: Medicare Other | Admitting: Cardiology

## 2020-09-23 ENCOUNTER — Other Ambulatory Visit: Payer: Self-pay

## 2020-09-23 ENCOUNTER — Encounter: Payer: Self-pay | Admitting: Cardiology

## 2020-09-23 ENCOUNTER — Telehealth: Payer: Self-pay | Admitting: *Deleted

## 2020-09-23 VITALS — BP 156/86 | HR 78 | Ht 60.5 in | Wt 215.0 lb

## 2020-09-23 DIAGNOSIS — G4733 Obstructive sleep apnea (adult) (pediatric): Secondary | ICD-10-CM | POA: Diagnosis not present

## 2020-09-23 DIAGNOSIS — I10 Essential (primary) hypertension: Secondary | ICD-10-CM

## 2020-09-23 MED ORDER — ESZOPICLONE 2 MG PO TABS: ORAL_TABLET | ORAL | 0 refills | Status: DC

## 2020-09-23 NOTE — Telephone Encounter (Signed)
Per Dr Radford Pax: set up CPAP titration and order Lunesta 2mg  #1 table with no refills to be taken on arrival at sleep lab  Refill sent to preferred pharmacy

## 2020-09-23 NOTE — Progress Notes (Signed)
Virtual Visit via Video Note   This visit type was conducted due to national recommendations for restrictions regarding the COVID-19 Pandemic (e.g. social distancing) in an effort to limit this patient's exposure and mitigate transmission in our community.  Due to her co-morbid illnesses, this patient is at least at moderate risk for complications without adequate follow up.  This format is felt to be most appropriate for this patient at this time.  All issues noted in this document were discussed and addressed.  A limited physical exam was performed with this format.  Please refer to the patient's chart for her consent to telehealth for [facility].  Date:  09/23/2020   ID:  Amy Williamson, DOB 11-11-1950, MRN [id number] The patient was identified using 2 identifiers.  Patient Location: Home Provider Location: Office/Clinic   PCP:  Hoyt Koch, MD   [place], MD Advanced Practice Provider:  No care team member to display Electrophysiologist:  None  Sleep Medicine: Fransico Him, MD   Evaluation Performed:  Followup  Chief Complaint:  OSA  History of Present Illness:    Amy Williamson is a 70 y.o. female with a hx of GERD and HLD who was referred by Dr. Gasper Williamson for evaluation of possible OSA due to PAF.  She does feel tired when she gets up but not bad and occasionally will take a nap during the day.  Her husband has told her that she does snore during the night.  Her Epworth sleepiness score is 7.  She was found to have moderate OSA with an AHI of 22.1/hr and drop in O2 sats to 70%.  CPAP titration was ordered but she wanted to discuss her results prior to proceeding. She is concerned about using the PAP mask because she is very claustrophobic.  The patient does not have symptoms concerning for COVID-19 infection (fever, chills, cough, or new shortness of breath).   Past Medical History:  Diagnosis  Date  . Arthritis    BACK AND SHOULDERS  . Bronchitis 07-14-11  . Cataract immature BILATERAL  . Depressive disorder, not elsewhere classified   . Dysphagia   . History of esophageal reflux   . History of migraine headaches   . History of pneumonia 05-14-11  . Obesity, unspecified   . Palpitations   . Plantar fasciitis 08/2017   left foot   . Pure hypercholesterolemia   . Unspecified essential hypertension   . Unspecified venous (peripheral) insufficiency    Past Surgical History:  Procedure Laterality Date  . BREAST LUMPECTOMY WITH RADIOACTIVE SEED LOCALIZATION Left 06/09/2020   Procedure: LEFT BREAST LUMPECTOMY WITH RADIOACTIVE SEED LOCALIZATION;  Surgeon: Coralie Keens, MD;  Location: [place];  Service: General;  Laterality: Left;  LMA  . CARDIOVERSION N/A 07/28/2020   Procedure: CARDIOVERSION;  Surgeon: Werner Lean, MD;  Location: [place] [facility];  Service: Cardiovascular;  Laterality: N/A;  . CARPAL TUNNEL RELEASE Right 2004  . CARPAL TUNNEL RELEASE Left 2006  . CESAREAN SECTION  0093,8182  . EAR CYST EXCISION  08/03/2011   patient states this is incorrect  . KNEE ARTHROSCOPY    . KNEE ARTHROSCOPY W/ MENISCAL REPAIR  08/03/2011   with popliteal cyst excition  . KNEE ARTHROSCOPY WITH EXCISION BAKER'S CYST    . PULLEY RELEASE RIGHT TRIGGER FINGER Right 01/08/2011  . TRIGGER FINGER RELEASE Right 04/20/13  . TRIGGER FINGER RELEASE Left 05/06/14  . TUBAL LIGATION  1977   with  second cesarean section     Current Meds  Medication Sig  . Acetaminophen (TYLENOL ARTHRITIS PAIN PO) Take 2 tablets by mouth daily.  [name] amLODipine (NORVASC) 5 MG tablet TAKE 1 TABLET BY MOUTH EVERY DAY  . apixaban (ELIQUIS) 5 MG TABS tablet Take 1 tablet (5 mg total) by mouth 2 (two) times daily.  . diphenhydramine-acetaminophen (TYLENOL PM) 25-500 MG TABS tablet Take 1 tablet by mouth at bedtime. Sleep  . loratadine (CLARITIN) 10 MG tablet Take 10 mg by mouth daily. Sinus  drainage  . omeprazole (PRILOSEC) 20 MG capsule TAKE 1 CAPSULE BY MOUTH EVERY DAY  . simvastatin (ZOCOR) 20 MG tablet TAKE 1 TABLET BY MOUTH EVERYDAY AT BEDTIME  . triamcinolone (NASACORT) 55 MCG/ACT AERO nasal inhaler Place 2 sprays into the nose daily as needed (allergies).     Allergies:   Atenolol, Avelox [moxifloxacin hcl in nacl], Azithromycin, Doxycycline, Myrbetriq [mirabegron], and Oxybutynin   Social History   Tobacco Use  . Smoking status: Never Smoker  . Smokeless tobacco: Never Used  Vaping Use  . Vaping Use: Never used  Substance Use Topics  . Alcohol use: No  . Drug use: No     Family Hx: The patient's family history includes Clotting disorder in her father; Heart disease in her father; Heart failure in her mother; Hyperlipidemia in her mother; Hypertension in her mother; Liver cancer in her brother; Liver disease in her maternal aunt; Osteoporosis in her mother. There is no history of Colon cancer.  ROS:   Please see the history of present illness.     All other systems reviewed and are negative.   Prior CV studies:   The following studies were reviewed today:  Sleep study   Labs/Other Tests and Data Reviewed:    EKG:  No ECG reviewed.  Recent Labs: 06/30/2020: ALT 17; TSH 2.22 07/08/2020: BUN 18; Creatinine, Ser 0.86; Hemoglobin 13.2; Platelets 231; Potassium 4.3; Sodium 139   Recent Lipid Panel Lab Results  Component Value Date/Time   CHOL 177 06/30/2020 09:03 AM   TRIG 116.0 06/30/2020 09:03 AM   TRIG 54 06/25/2006 07:25 AM   HDL 61.40 06/30/2020 09:03 AM   CHOLHDL 3 06/30/2020 09:03 AM   LDLCALC 92 06/30/2020 09:03 AM   LDLDIRECT 142.3 03/16/2013 07:32 AM    Wt Readings from Last 3 Encounters:  09/23/20 215 lb (97.5 kg)  09/13/20 214 lb (97.1 kg)  09/03/20 215 lb (97.5 kg)     Risk Assessment/Calculations:      Objective:    Vital Signs:  BP (!) 156/86   Pulse 78   Ht 5' 0.5" (1.537 m)   Wt 215 lb (97.5 kg)   LMP 07/23/2004    BMI 41.30 kg/m    VITAL SIGNS:  reviewed GEN:  no acute distress EYES:  sclerae anicteric, EOMI - Extraocular Movements Intact RESPIRATORY:  normal respiratory effort, symmetric expansion CARDIOVASCULAR:  no peripheral edema SKIN:  no rash, lesions or ulcers. MUSCULOSKELETAL:  no obvious deformities. NEURO:  alert and oriented x 3, no obvious focal deficit PSYCH:  normal affect  ASSESSMENT & PLAN:    1.  OSA -she was found to have moderate OSA with an AHI of 22.hr -we discussed the different options for treatment of OSA including oral device, PAP therapy and Inspire device -she is not a candidate for the hypoglossal nerve stimulator due to weight -I have encouraged her to tell the tech at the sleep lab when she gets her PAP titration done  to use a nasal pillow mask with chin strap -I will see her back after her CPAP titration -she is worried that she will not be able to sleep for the PAP titration so I will give her Lunesta 2mg  to take that night  2.  HTN -BP borderline controlled on exam today -continue on amlodipine 5mg  daily  COVID-19 Education: The signs and symptoms of COVID-19 were discussed with the patient and how to seek care for testing (follow up with PCP or arrange E-visit).  The importance of social distancing was discussed today.  Time:   Today, I have spent 20 minutes with the patient with telehealth technology discussing the above problems.     Medication Adjustments/Labs and Tests Ordered: Current medicines are reviewed at length with the patient today.  Concerns regarding medicines are outlined above.   Tests Ordered: No orders of the defined types were placed in this encounter.   Medication Changes: No orders of the defined types were placed in this encounter.   Follow Up:  Virtual Visit  after her CPAP titration  Signed, Fransico Him, MD  09/23/2020 8:51 AM    [place]

## 2020-09-23 NOTE — Telephone Encounter (Signed)
Staff message sent to Amy Williamson ok to schedule titration study. Per [facility] web portal no PA is required. Decision [id number].

## 2020-09-23 NOTE — Addendum Note (Signed)
Addended by: Freada Bergeron on: 09/23/2020 11:57 AM   Modules accepted: Orders

## 2020-10-06 NOTE — Telephone Encounter (Addendum)
Patient is scheduled for CPAP Titration on 12/08/20. Patient understands his titration study will be done at [facility] sleep lab. Patient understands he will receive a letter in a week or so detailing appointment, date, time, and location. Patient understands to call if he does not receive the letter  in a timely manner. Patient agrees with treatment and thanked me for call.

## 2020-10-06 NOTE — Telephone Encounter (Signed)
Lauralee Evener, CMA  Freada Bergeron, CMA Ok to schedule sleep study. Per [facility] web portal no PA is required. Decision [id number].

## 2020-10-11 ENCOUNTER — Other Ambulatory Visit: Payer: Self-pay | Admitting: Internal Medicine

## 2020-10-12 ENCOUNTER — Other Ambulatory Visit: Payer: Self-pay | Admitting: Internal Medicine

## 2020-11-07 ENCOUNTER — Encounter (HOSPITAL_BASED_OUTPATIENT_CLINIC_OR_DEPARTMENT_OTHER): Payer: Self-pay | Admitting: Obstetrics & Gynecology

## 2020-11-07 ENCOUNTER — Other Ambulatory Visit (HOSPITAL_COMMUNITY)
Admission: RE | Admit: 2020-11-07 | Discharge: 2020-11-07 | Disposition: A | Discharge status: Home / Self Care | Payer: Medicare Other | Source: Ambulatory Visit | Attending: Obstetrics & Gynecology | Admitting: Obstetrics & Gynecology

## 2020-11-07 ENCOUNTER — Ambulatory Visit (HOSPITAL_BASED_OUTPATIENT_CLINIC_OR_DEPARTMENT_OTHER): Payer: Medicare Other | Admitting: Obstetrics & Gynecology

## 2020-11-07 ENCOUNTER — Other Ambulatory Visit: Payer: Self-pay

## 2020-11-07 VITALS — BP 147/79 | HR 75 | Ht 60.0 in | Wt 212.2 lb

## 2020-11-07 DIAGNOSIS — Z124 Encounter for screening for malignant neoplasm of cervix: Secondary | ICD-10-CM | POA: Insufficient documentation

## 2020-11-07 DIAGNOSIS — N950 Postmenopausal bleeding: Secondary | ICD-10-CM | POA: Diagnosis present

## 2020-11-07 DIAGNOSIS — D260 Other benign neoplasm of cervix uteri: Secondary | ICD-10-CM | POA: Insufficient documentation

## 2020-11-07 DIAGNOSIS — N841 Polyp of cervix uteri: Secondary | ICD-10-CM | POA: Diagnosis not present

## 2020-11-07 DIAGNOSIS — R319 Hematuria, unspecified: Secondary | ICD-10-CM

## 2020-11-07 LAB — POCT URINALYSIS DIPSTICK
Appearance: NORMAL
Bilirubin, UA: NEGATIVE
Glucose, UA: NEGATIVE
Ketones, UA: NEGATIVE
Leukocytes, UA: NEGATIVE
Nitrite, UA: NEGATIVE
Protein, UA: NEGATIVE
Spec Grav, UA: 1.015 (ref 1.010–1.025)
Urobilinogen, UA: 0.2 E.U./dL
pH, UA: 6.5 (ref 5.0–8.0)

## 2020-11-07 NOTE — Progress Notes (Signed)
GYNECOLOGY  VISIT  CC:   Vaginal bleeding  HPI: 69 y.o. G2P2 Married White or Caucasian female here for complaint of vaginal bleeding that started about 10 days ago.  She noticed it initially with wiping after voiding.  She blood was stringy looking.  She did have some cramping with the bleeding.  It was heavier and she needs to use a pad but now the bleeding is just spotting.    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  About two weeks later, she went into afib.  She is on eliquis 5mg  bid.  Had cardioversion 07/28/2020.  She is on the eliquis for long term.  She's also been diagnosed with OSA.  She is not using a CPAP yet.    Last pap smear was 02/20/2018 and was normal.    Patient Active Problem List   Diagnosis Date Noted  . OSA (obstructive sleep apnea) 09/13/2020  . Persistent atrial fibrillation ([place]) 07/08/2020  . Daytime somnolence 07/08/2020  . Chest pain of uncertain etiology 05/39/7673  . Mixed hyperlipidemia 07/08/2020  . Aortic atherosclerosis ([place]) 06/30/2020  . Bronchitis 06/12/2019  . Routine general medical examination at a health care facility 04/02/2016  . Other dysphagia 09/21/2013  . New onset atrial fibrillation ([place]) 03/24/2013  . Morbid obesity ([place]) 03/24/2013  . DJD (degenerative joint disease) 03/24/2013  . GERD (gastroesophageal reflux disease) 02/27/2012  . Hyperlipidemia 01/21/2008  . Essential hypertension 01/21/2008    Past Medical History:  Diagnosis Date  . Arthritis    BACK AND SHOULDERS  . Bronchitis 07-14-11  . Cataract immature BILATERAL  . Depressive disorder, not elsewhere classified   . Dysphagia   . History of esophageal reflux   . History of migraine headaches   . History of pneumonia 05-14-11  . Obesity, unspecified   . Palpitations   . Plantar fasciitis 08/2017   left foot   . Pure hypercholesterolemia   . Unspecified essential hypertension   . Unspecified venous (peripheral)  insufficiency     Past Surgical History:  Procedure Laterality Date  . BREAST LUMPECTOMY WITH RADIOACTIVE SEED LOCALIZATION Left 06/09/2020   Procedure: LEFT BREAST LUMPECTOMY WITH RADIOACTIVE SEED LOCALIZATION;  Surgeon: Coralie Keens, MD;  Location: [place];  Service: General;  Laterality: Left;  LMA  . CARDIOVERSION N/A 07/28/2020   Procedure: CARDIOVERSION;  Surgeon: Werner Lean, MD;  Location: [place] [facility];  Service: Cardiovascular;  Laterality: N/A;  . CARPAL TUNNEL RELEASE Right 2004  . CARPAL TUNNEL RELEASE Left 2006  . CESAREAN SECTION  [id number]  . EAR CYST EXCISION  08/03/2011   patient states this is incorrect  . KNEE ARTHROSCOPY    . KNEE ARTHROSCOPY W/ MENISCAL REPAIR  08/03/2011   with popliteal cyst excition  . KNEE ARTHROSCOPY WITH EXCISION BAKER'S CYST    . PULLEY RELEASE RIGHT TRIGGER FINGER Right 01/08/2011  . TRIGGER FINGER RELEASE Right 04/20/13  . TRIGGER FINGER RELEASE Left 05/06/14  . TUBAL LIGATION  1977   with second cesarean section    MEDS:   Current Outpatient Medications on File Prior to Visit  Medication Sig Dispense Refill  . Acetaminophen (TYLENOL ARTHRITIS PAIN PO) Take 2 tablets by mouth daily.    [name] amLODipine (NORVASC) 5 MG tablet TAKE 1 TABLET BY MOUTH EVERY DAY 90 tablet 2  . apixaban (ELIQUIS) 5 MG TABS tablet Take 1 tablet (5 mg total) by mouth 2 (two) times daily. 60 tablet 6  .  diphenhydramine-acetaminophen (TYLENOL PM) 25-500 MG TABS tablet Take 1 tablet by mouth at bedtime. Sleep    . loratadine (CLARITIN) 10 MG tablet Take 10 mg by mouth daily. Sinus drainage    . omeprazole (PRILOSEC) 20 MG capsule TAKE 1 CAPSULE BY MOUTH EVERY DAY 90 capsule 3  . simvastatin (ZOCOR) 20 MG tablet TAKE 1 TABLET BY MOUTH EVERYDAY AT BEDTIME 90 tablet 1  . triamcinolone (NASACORT) 55 MCG/ACT AERO nasal inhaler Place 2 sprays into the nose daily as needed (allergies).    . eszopiclone (LUNESTA) 2 MG TABS tablet Take I tablet  upon arrival to sleep lab. (Patient not taking: Reported on 11/07/2020) 1 tablet 0   No current facility-administered medications on file prior to visit.    ALLERGIES: Atenolol, Avelox [moxifloxacin hcl in nacl], Azithromycin, Doxycycline, Myrbetriq [mirabegron], and Oxybutynin  Family History  Problem Relation Age of Onset  . Heart failure Mother   . Osteoporosis Mother   . Hyperlipidemia Mother   . Hypertension Mother   . Heart disease Father   . Clotting disorder Father   . Liver cancer Brother   . Liver disease Maternal Aunt        x2  . Colon cancer Neg Hx     SH:  Married, non smoker  Review of Systems  Constitutional: Negative.   Gastrointestinal: Negative.   Genitourinary: Positive for vaginal bleeding. Negative for difficulty urinating, dysuria, flank pain and frequency.  Psychiatric/Behavioral: Negative.     PHYSICAL EXAMINATION:    BP (!) 147/79   Pulse 75   Ht 5' (1.524 m)   Wt 212 lb 3.2 oz (96.3 kg)   LMP 07/23/2004   BMI 41.44 kg/m     General appearance: alert, cooperative and appears stated age Abdomen: soft, non-tender; bowel sounds normal; no masses,  no organomegaly Lymph:  no inguinal LAD noted  Pelvic: External genitalia:  no lesions              Urethra:  normal appearing urethra with no masses, tenderness or lesions              Bartholins and Skenes: normal                 Vagina: normal appearing vagina with normal color and discharge, no lesions              Cervix: no lesions and blood noted at os              Bimanual Exam:  Uterus:  normal size, contour, position, consistency, mobility, non-tender              Adnexa: no mass, fullness, tenderness              Rectovaginal: Yes.  .  Confirms.              Anus:  normal sphincter tone, no lesions  Endometrial biopsy recommended.  Discussed with patient.  Verbal and written consent obtained.   Procedure:  Speculum placed.  Cervix visualized and cleansed with betadine prep.  A single  toothed tenaculum was applied to the anterior lip of the cervix.  Attempt made to pass pipelle through os.  Some stenosis was noted.  Dilation with milex dilator required.  Then endometrial pipelle was advanced through the cervix into the endometrial cavity without difficulty.  Pipelle passed to 8cm.  Suction applied and pipelle removed with good tissue sample obtained.  Tenculum removed.  No bleeding noted.  Patient tolerated  procedure well.  Chaperone, Kin Lancaster, CMA, was present for exam.  Assessment/Plan: 1. Postmenopausal bleeding - Surgical pathology( [place]/ [facility])  2. Cervical cancer screening - Cytology - PAP( [place])  3. Hematuria, unspecified type - this does not appear to be hematuria but actually uterine bleeding - POCT urinalysis dipstick

## 2020-11-08 LAB — SURGICAL PATHOLOGY

## 2020-11-09 ENCOUNTER — Other Ambulatory Visit (HOSPITAL_BASED_OUTPATIENT_CLINIC_OR_DEPARTMENT_OTHER): Payer: Self-pay | Admitting: Obstetrics & Gynecology

## 2020-11-09 DIAGNOSIS — N950 Postmenopausal bleeding: Secondary | ICD-10-CM

## 2020-11-09 LAB — CYTOLOGY - PAP: Diagnosis: NEGATIVE

## 2020-11-10 ENCOUNTER — Encounter (HOSPITAL_BASED_OUTPATIENT_CLINIC_OR_DEPARTMENT_OTHER): Payer: Self-pay | Admitting: Obstetrics & Gynecology

## 2020-11-10 ENCOUNTER — Other Ambulatory Visit: Payer: Self-pay

## 2020-11-10 ENCOUNTER — Ambulatory Visit (INDEPENDENT_AMBULATORY_CARE_PROVIDER_SITE_OTHER): Payer: Medicare Other | Admitting: Obstetrics & Gynecology

## 2020-11-10 ENCOUNTER — Ambulatory Visit (HOSPITAL_BASED_OUTPATIENT_CLINIC_OR_DEPARTMENT_OTHER)
Admission: RE | Admit: 2020-11-10 | Discharge: 2020-11-10 | Disposition: A | Discharge status: Home / Self Care | Payer: Medicare Other | Source: Ambulatory Visit | Attending: Obstetrics & Gynecology | Admitting: Obstetrics & Gynecology

## 2020-11-10 DIAGNOSIS — R9389 Abnormal findings on diagnostic imaging of other specified body structures: Secondary | ICD-10-CM

## 2020-11-10 DIAGNOSIS — N950 Postmenopausal bleeding: Secondary | ICD-10-CM | POA: Diagnosis not present

## 2020-11-10 NOTE — Progress Notes (Signed)
GYNECOLOGY  VISIT  CC:   Review ultrasound results  HPI: 70 y.o. G2P2 Married White or Caucasian female here for discussion of ultrasound results.  Images are present in Epic but official reading is not.  I reviewed images myself with pt.  Ovaries not clearly identified.  Endometrium 67mm.  No fibroids note.  She is still bleeding.  Given continued bleeding, additional evaluation with hysteroscopy, D&C, possible resection of any endometrial lesion recommended.  Pt asks if eliquis is causing this bleeding.  I think it is contributing but that does not mean it is causing it.  Given recent afib and cardioversion, she does need to be this medication.  Endometrial biopsy was negative showing inactive endometrium on 11/07/2020.    Procedure discussed with patient.  Recovery and pain management discussed.  Risks discussed including but not limited to bleeding, rare risk of transfusion, infection, 1% risk of uterine perforation with risks of fluid deficit causing cardiac arrythmia, cerebral swelling and/or need to stop procedure early.  Fluid emboli and rare risk of death discussed.  DVT/PE, rare risk of risk of bowel/bladder/ureteral/vascular injury.  Patient aware if pathology abnormal she may need additional treatment.  All questions answered.    (After note was written, official radiology report was completed.  Endometrium was 67mm per report.)   GYNECOLOGIC HISTORY: Patient's last menstrual period was 07/23/2004. Menopausal hormone therapy: none  Patient Active Problem List   Diagnosis Date Noted  . OSA (obstructive sleep apnea) 09/13/2020  . Persistent atrial fibrillation ([place]) 07/08/2020  . Daytime somnolence 07/08/2020  . Chest pain of uncertain etiology 16/38/4665  . Mixed hyperlipidemia 07/08/2020  . Aortic atherosclerosis ([place]) 06/30/2020  . Bronchitis 06/12/2019  . Routine general medical examination at a health care facility 04/02/2016  . Other dysphagia 09/21/2013  . New onset atrial  fibrillation ([place]) 03/24/2013  . Morbid obesity ([place]) 03/24/2013  . DJD (degenerative joint disease) 03/24/2013  . GERD (gastroesophageal reflux disease) 02/27/2012  . Hyperlipidemia 01/21/2008  . Essential hypertension 01/21/2008    Past Medical History:  Diagnosis Date  . Arthritis    BACK AND SHOULDERS  . Bronchitis 07-14-11  . Cataract immature BILATERAL  . Depressive disorder, not elsewhere classified   . Dysphagia   . History of esophageal reflux   . History of migraine headaches   . History of pneumonia 05-14-11  . Obesity, unspecified   . Palpitations   . Plantar fasciitis 08/2017   left foot   . Pure hypercholesterolemia   . Unspecified essential hypertension   . Unspecified venous (peripheral) insufficiency     Past Surgical History:  Procedure Laterality Date  . BREAST LUMPECTOMY WITH RADIOACTIVE SEED LOCALIZATION Left 06/09/2020   Procedure: LEFT BREAST LUMPECTOMY WITH RADIOACTIVE SEED LOCALIZATION;  Surgeon: Coralie Keens, MD;  Location: [place];  Service: General;  Laterality: Left;  LMA  . CARDIOVERSION N/A 07/28/2020   Procedure: CARDIOVERSION;  Surgeon: Werner Lean, MD;  Location: [place] [facility];  Service: Cardiovascular;  Laterality: N/A;  . CARPAL TUNNEL RELEASE Right 2004  . CARPAL TUNNEL RELEASE Left 2006  . CESAREAN SECTION  9935,7017  . EAR CYST EXCISION  08/03/2011   patient states this is incorrect  . KNEE ARTHROSCOPY    . KNEE ARTHROSCOPY W/ MENISCAL REPAIR  08/03/2011   with popliteal cyst excition  . KNEE ARTHROSCOPY WITH EXCISION BAKER'S CYST    . PULLEY RELEASE RIGHT TRIGGER FINGER Right 01/08/2011  . TRIGGER FINGER RELEASE Right 04/20/13  . TRIGGER FINGER RELEASE  Left 05/06/14  . TUBAL LIGATION  1977   with second cesarean section    MEDS:   Current Outpatient Medications on File Prior to Visit  Medication Sig Dispense Refill  . Acetaminophen (TYLENOL ARTHRITIS PAIN PO) Take 2 tablets by mouth daily.    [name]  amLODipine (NORVASC) 5 MG tablet TAKE 1 TABLET BY MOUTH EVERY DAY 90 tablet 2  . apixaban (ELIQUIS) 5 MG TABS tablet Take 1 tablet (5 mg total) by mouth 2 (two) times daily. 60 tablet 6  . diphenhydramine-acetaminophen (TYLENOL PM) 25-500 MG TABS tablet Take 1 tablet by mouth at bedtime. Sleep    . eszopiclone (LUNESTA) 2 MG TABS tablet Take I tablet upon arrival to sleep lab. 1 tablet 0  . loratadine (CLARITIN) 10 MG tablet Take 10 mg by mouth daily. Sinus drainage    . omeprazole (PRILOSEC) 20 MG capsule TAKE 1 CAPSULE BY MOUTH EVERY DAY 90 capsule 3  . simvastatin (ZOCOR) 20 MG tablet TAKE 1 TABLET BY MOUTH EVERYDAY AT BEDTIME 90 tablet 1  . triamcinolone (NASACORT) 55 MCG/ACT AERO nasal inhaler Place 2 sprays into the nose daily as needed (allergies).     No current facility-administered medications on file prior to visit.    ALLERGIES: Atenolol, Avelox [moxifloxacin hcl in nacl], Azithromycin, Doxycycline, Myrbetriq [mirabegron], and Oxybutynin  Family History  Problem Relation Age of Onset  . Heart failure Mother   . Osteoporosis Mother   . Hyperlipidemia Mother   . Hypertension Mother   . Heart disease Father   . Clotting disorder Father   . Liver cancer Brother   . Liver disease Maternal Aunt        x2  . Colon cancer Neg Hx     SH:  Married, non smoker  Review of Systems  Constitutional: Negative.   Respiratory: Negative.   Cardiovascular: Negative.   Gastrointestinal: Negative.   Genitourinary: Positive for vaginal bleeding.    PHYSICAL EXAMINATION:    LMP 07/23/2004     Physical Exam Constitutional:      Appearance: Normal appearance.  Cardiovascular:     Rate and Rhythm: Normal rate and regular rhythm.     Pulses: Normal pulses.     Heart sounds: Normal heart sounds.  Pulmonary:     Effort: Pulmonary effort is normal.     Breath sounds: Normal breath sounds.  Skin:    General: Skin is warm.  Neurological:     General: No focal deficit present.      Mental Status: She is alert.  Psychiatric:        Mood and Affect: Mood normal.    Assessment/Plan: 1. Postmenopausal bleeding - with normal thin endometrium and negative endometrial biopsy, need to proceed with additional evaluation.  Hysteroscopy with D&C recommended.  Risks, benefits discussed.  With inactive endometrium on ultrasound, do not feel progesterone therapy is appropriate at this point.  Will reach out to cardiology regarding stopping Eliquis prior to procedure.  Questions answered.  Pt's husband was with her today as well and was able to ask questions as well.  2.  Thickened endometrium

## 2020-11-11 ENCOUNTER — Telehealth: Payer: Self-pay | Admitting: *Deleted

## 2020-11-11 NOTE — Telephone Encounter (Signed)
Call to patient. Surgery date options reviewed. Patient agreeable to 11-22-20 date. Advised will call her back once scheduled.

## 2020-11-14 ENCOUNTER — Ambulatory Visit (HOSPITAL_BASED_OUTPATIENT_CLINIC_OR_DEPARTMENT_OTHER): Payer: Medicare Other | Admitting: Obstetrics & Gynecology

## 2020-11-14 ENCOUNTER — Encounter (HOSPITAL_BASED_OUTPATIENT_CLINIC_OR_DEPARTMENT_OTHER): Payer: Self-pay | Admitting: *Deleted

## 2020-11-14 NOTE — Telephone Encounter (Signed)
Called patient and notified her of pharmacist recommendation in regards to holding Eliquis for upcoming procedure.  Patient is safe to hold for 2-3 days which ever OB/GYN prefers.  She is aware and verbalizes understanding.  She would like to discuss blood thinner switch if vaginal bleeding does not stop after procedure.  I advised her to just reach out to our office if she needs further assistance. She is agreeable to plan.

## 2020-11-14 NOTE — Telephone Encounter (Signed)
Call to patient. Surgery scheduled for Tuesday, 11-22-20 at [facility] at 1030. Arrive 0830. Advised per Dr Sabra Heck, she reviewed with cardiology and patient to discontinue Eliquis two days prior to surgery.  Patient is concerned about this as she expected three days.  Advised to confirm with Dr Sabra Heck or cardiology if has any questions or concerns.   Routing to Dr Sabra Heck to review Eliquis.

## 2020-11-16 ENCOUNTER — Other Ambulatory Visit (HOSPITAL_BASED_OUTPATIENT_CLINIC_OR_DEPARTMENT_OTHER): Payer: Self-pay | Admitting: Obstetrics & Gynecology

## 2020-11-16 ENCOUNTER — Other Ambulatory Visit: Payer: Self-pay

## 2020-11-16 ENCOUNTER — Encounter (HOSPITAL_BASED_OUTPATIENT_CLINIC_OR_DEPARTMENT_OTHER): Payer: Self-pay | Admitting: Obstetrics & Gynecology

## 2020-11-16 NOTE — Progress Notes (Signed)
Spoke w/ via phone for pre-op interview--- PT Lab needs dos----  CBC, Istat             Lab results------ current ekg in epic/ chart COVID test ------ 11-18-2020 @ 1749  Arrive at ------- 0830 on 11-22-2020 NPO after MN NO Solid Food.  Clear liquids from MN until--- 0730 Med rec completed Medications to take morning of surgery ----- Norvasc, Prilosec Diabetic medication ----- n/a  Patient instructed to bring photo id and insurance card day of surgery Patient aware to have Driver (ride ) / caregiver    for 24 hours after surgery -- husband, Sonia Side Patient Special Instructions ----- n/a  Pre-Op special Istructions ----- pt has eliquis clearance to stop 2 days prior to surgery per note dated 11-14-2020 and 11-16-2020 in epic/ chart  Patient verbalized understanding of instructions that were given at this phone interview. Patient denies shortness of breath, chest pain, fever, cough at this phone interview.   Anesthesia Review:  HTN;  PAF dx 12/ 2021 s/p DCCV 07-28-2020 last ekg at cardiology visit 09-13-2020 SR rate 61;  New dx OSA moderate per study 02/ 2022 in epic, pt is scheduled for cpap titration 12-08-2020;  Pt denies any cardiac s&s, sob, and no peripheral swelling.  PCP:  Dr Johnette Abraham. Wandra Mannan 08-06-2020 epic) Cardiologist : Dr Jerilynn Mages. Beverely Risen 09-13-2020 epic)  Chest x-ray : 06-04-2019 EKG : 09-13-2020 Echo : 08-10-2020 Stress test: no Cardiac Cath :  no Activity level:  Denies sob w/ any activity Sleep Study/ CPAP :  Yes/ no fitted yet Fasting Blood Sugar :      / Checks Blood Sugar -- times a day:  N/a  Blood Thinner/ Instructions /Last Dose: Eliquis ASA / Instructions/ Last Dose :  NO Per pt given instructions by cardiology to stop 2 days prior,  Stated her last dose will be 11-19-2020

## 2020-11-16 NOTE — Telephone Encounter (Signed)
Amy Salon, MD  Huey Romans, RN I call pt and let her know to stop for two days prior and that I talked to her cardiologist.     Per note in [facility], patient has confirmed with cardiology through My Chart on 11-14-20.  Encounter closed.

## 2020-11-18 ENCOUNTER — Other Ambulatory Visit (HOSPITAL_COMMUNITY)
Admission: RE | Admit: 2020-11-18 | Discharge: 2020-11-18 | Disposition: A | Discharge status: Home / Self Care | Payer: Medicare Other | Source: Ambulatory Visit | Attending: Obstetrics & Gynecology | Admitting: Obstetrics & Gynecology

## 2020-11-18 DIAGNOSIS — Z20822 Contact with and (suspected) exposure to covid-19: Secondary | ICD-10-CM | POA: Insufficient documentation

## 2020-11-18 DIAGNOSIS — Z01812 Encounter for preprocedural laboratory examination: Secondary | ICD-10-CM | POA: Diagnosis present

## 2020-11-18 LAB — SARS CORONAVIRUS 2 (TAT 6-24 HRS): SARS Coronavirus 2: NEGATIVE

## 2020-11-22 ENCOUNTER — Ambulatory Visit (HOSPITAL_BASED_OUTPATIENT_CLINIC_OR_DEPARTMENT_OTHER): Payer: Medicare Other | Admitting: Anesthesiology

## 2020-11-22 ENCOUNTER — Encounter (HOSPITAL_BASED_OUTPATIENT_CLINIC_OR_DEPARTMENT_OTHER): Payer: Self-pay | Admitting: Obstetrics & Gynecology

## 2020-11-22 ENCOUNTER — Encounter (HOSPITAL_BASED_OUTPATIENT_CLINIC_OR_DEPARTMENT_OTHER)
Admission: RE | Disposition: A | Discharge status: Home / Self Care | Payer: Self-pay | Source: Home / Self Care | Attending: Obstetrics & Gynecology

## 2020-11-22 ENCOUNTER — Ambulatory Visit (HOSPITAL_BASED_OUTPATIENT_CLINIC_OR_DEPARTMENT_OTHER)
Admission: RE | Admit: 2020-11-22 | Discharge: 2020-11-22 | Disposition: A | Discharge status: Home / Self Care | Payer: Medicare Other | Attending: Obstetrics & Gynecology | Admitting: Obstetrics & Gynecology

## 2020-11-22 DIAGNOSIS — N840 Polyp of corpus uteri: Secondary | ICD-10-CM | POA: Diagnosis not present

## 2020-11-22 DIAGNOSIS — Z79899 Other long term (current) drug therapy: Secondary | ICD-10-CM | POA: Diagnosis not present

## 2020-11-22 DIAGNOSIS — Z881 Allergy status to other antibiotic agents status: Secondary | ICD-10-CM | POA: Diagnosis not present

## 2020-11-22 DIAGNOSIS — N950 Postmenopausal bleeding: Secondary | ICD-10-CM | POA: Diagnosis present

## 2020-11-22 DIAGNOSIS — Z888 Allergy status to other drugs, medicaments and biological substances status: Secondary | ICD-10-CM | POA: Insufficient documentation

## 2020-11-22 DIAGNOSIS — N841 Polyp of cervix uteri: Secondary | ICD-10-CM | POA: Insufficient documentation

## 2020-11-22 DIAGNOSIS — R9389 Abnormal findings on diagnostic imaging of other specified body structures: Secondary | ICD-10-CM | POA: Diagnosis present

## 2020-11-22 HISTORY — DX: Gastro-esophageal reflux disease without esophagitis: K21.9

## 2020-11-22 HISTORY — DX: Long term (current) use of anticoagulants: Z79.01

## 2020-11-22 HISTORY — DX: Paroxysmal atrial fibrillation: I48.0

## 2020-11-22 HISTORY — DX: Personal history of other diseases of the digestive system: Z87.19

## 2020-11-22 HISTORY — DX: Essential (primary) hypertension: I10

## 2020-11-22 HISTORY — DX: Postmenopausal bleeding: N95.0

## 2020-11-22 HISTORY — DX: Frequency of micturition: R35.0

## 2020-11-22 HISTORY — DX: Obstructive sleep apnea (adult) (pediatric): G47.33

## 2020-11-22 HISTORY — PX: DILATATION & CURETTAGE/HYSTEROSCOPY WITH MYOSURE: SHX6511

## 2020-11-22 LAB — CBC
HCT: 41.7 % (ref 36.0–46.0)
Hemoglobin: 13.5 g/dL (ref 12.0–15.0)
MCH: 28.7 pg (ref 26.0–34.0)
MCHC: 32.4 g/dL (ref 30.0–36.0)
MCV: 88.5 fL (ref 80.0–100.0)
Platelets: 185 10*3/uL (ref 150–400)
RBC: 4.71 MIL/uL (ref 3.87–5.11)
RDW: 14.6 % (ref 11.5–15.5)
WBC: 9.2 10*3/uL (ref 4.0–10.5)
nRBC: 0 % (ref 0.0–0.2)

## 2020-11-22 LAB — POCT I-STAT, CHEM 8
BUN: 32 mg/dL — ABNORMAL HIGH (ref 8–23)
Calcium, Ion: 1.28 mmol/L (ref 1.15–1.40)
Chloride: 101 mmol/L (ref 98–111)
Creatinine, Ser: 0.9 mg/dL (ref 0.44–1.00)
Glucose, Bld: 88 mg/dL (ref 70–99)
HCT: 42 % (ref 36.0–46.0)
Hemoglobin: 14.3 g/dL (ref 12.0–15.0)
Potassium: 4.3 mmol/L (ref 3.5–5.1)
Sodium: 139 mmol/L (ref 135–145)
TCO2: 31 mmol/L (ref 22–32)

## 2020-11-22 SURGERY — DILATATION & CURETTAGE/HYSTEROSCOPY WITH MYOSURE
Anesthesia: General | Site: Vagina

## 2020-11-22 MED ORDER — POVIDONE-IODINE 10 % EX SWAB: 2.0000 "application " | Freq: Once | CUTANEOUS | Status: DC

## 2020-11-22 MED ORDER — DEXAMETHASONE SODIUM PHOSPHATE 10 MG/ML IJ SOLN
INTRAMUSCULAR | Status: AC
  Filled 2020-11-22: qty 1

## 2020-11-22 MED ORDER — LACTATED RINGERS IV SOLN: INTRAVENOUS | Status: DC

## 2020-11-22 MED ORDER — LIDOCAINE 2% (20 MG/ML) 5 ML SYRINGE
INTRAMUSCULAR | Status: DC | PRN
  Administered 2020-11-22: 100 mg via INTRAVENOUS

## 2020-11-22 MED ORDER — ACETAMINOPHEN 500 MG PO TABS
1000.0000 mg | ORAL_TABLET | Freq: Once | ORAL | Status: AC
  Administered 2020-11-22: 1000 mg via ORAL

## 2020-11-22 MED ORDER — FENTANYL CITRATE (PF) 100 MCG/2ML IJ SOLN
INTRAMUSCULAR | Status: DC | PRN
  Administered 2020-11-22: 75 ug via INTRAVENOUS
  Administered 2020-11-22: 25 ug via INTRAVENOUS

## 2020-11-22 MED ORDER — SODIUM CHLORIDE 0.9 % IR SOLN
Status: DC | PRN
  Administered 2020-11-22: 3000 mL

## 2020-11-22 MED ORDER — PROPOFOL 10 MG/ML IV BOLUS
INTRAVENOUS | Status: DC | PRN
  Administered 2020-11-22: 180 mg via INTRAVENOUS

## 2020-11-22 MED ORDER — KETOROLAC TROMETHAMINE 30 MG/ML IJ SOLN
INTRAMUSCULAR | Status: AC
  Filled 2020-11-22: qty 1

## 2020-11-22 MED ORDER — LIDOCAINE 2% (20 MG/ML) 5 ML SYRINGE
INTRAMUSCULAR | Status: AC
  Filled 2020-11-22: qty 5

## 2020-11-22 MED ORDER — ONDANSETRON HCL 4 MG/2ML IJ SOLN
INTRAMUSCULAR | Status: DC | PRN
  Administered 2020-11-22: 4 mg via INTRAVENOUS

## 2020-11-22 MED ORDER — ACETAMINOPHEN 500 MG PO TABS
ORAL_TABLET | ORAL | Status: AC
  Filled 2020-11-22: qty 2

## 2020-11-22 MED ORDER — KETOROLAC TROMETHAMINE 15 MG/ML IJ SOLN
INTRAMUSCULAR | Status: DC | PRN
  Administered 2020-11-22: 15 mg via INTRAVENOUS

## 2020-11-22 MED ORDER — ONDANSETRON HCL 4 MG/2ML IJ SOLN
INTRAMUSCULAR | Status: AC
  Filled 2020-11-22: qty 2

## 2020-11-22 MED ORDER — DEXAMETHASONE SODIUM PHOSPHATE 10 MG/ML IJ SOLN
INTRAMUSCULAR | Status: DC | PRN
  Administered 2020-11-22: 5 mg via INTRAVENOUS

## 2020-11-22 MED ORDER — HYDROCODONE-ACETAMINOPHEN 5-325 MG PO TABS: 1.0000 | ORAL_TABLET | Freq: Four times a day (QID) | ORAL | 0 refills | Status: DC | PRN

## 2020-11-22 MED ORDER — LIDOCAINE-EPINEPHRINE 1 %-1:100000 IJ SOLN
INTRAMUSCULAR | Status: DC | PRN
  Administered 2020-11-22: 10 mL

## 2020-11-22 MED ORDER — FENTANYL CITRATE (PF) 100 MCG/2ML IJ SOLN
INTRAMUSCULAR | Status: AC
  Filled 2020-11-22: qty 2

## 2020-11-22 SURGICAL SUPPLY — 24 items
BIPOLAR CUTTING LOOP 21FR (ELECTRODE)
CANISTER SUCT 3000ML PPV (MISCELLANEOUS) IMPLANT
CATH ROBINSON RED A/P 16FR (CATHETERS) ×2 IMPLANT
COVER WAND RF STERILE (DRAPES) ×2 IMPLANT
DEVICE MYOSURE LITE (MISCELLANEOUS) ×2 IMPLANT
DEVICE MYOSURE REACH (MISCELLANEOUS) IMPLANT
DILATOR CANAL MILEX (MISCELLANEOUS) IMPLANT
GAUZE 4X4 16PLY RFD (DISPOSABLE) ×2 IMPLANT
GLOVE SURG LTX SZ6.5 (GLOVE) ×4 IMPLANT
GLOVE SURG UNDER POLY LF SZ6.5 (GLOVE) ×4 IMPLANT
GLOVE SURG UNDER POLY LF SZ7 (GLOVE) ×2 IMPLANT
GLOVE SURG UNDER POLY LF SZ7.5 (GLOVE) ×2 IMPLANT
GOWN STRL REUS W/ TWL LRG LVL3 (GOWN DISPOSABLE) ×1 IMPLANT
GOWN STRL REUS W/TWL LRG LVL3 (GOWN DISPOSABLE) ×4 IMPLANT
IV NS IRRIG 3000ML ARTHROMATIC (IV SOLUTION) ×2 IMPLANT
KIT PROCEDURE FLUENT (KITS) ×2 IMPLANT
KIT TURNOVER CYSTO (KITS) ×2 IMPLANT
LOOP CUTTING BIPOLAR 21FR (ELECTRODE) IMPLANT
PACK VAGINAL MINOR WOMEN LF (CUSTOM PROCEDURE TRAY) ×2 IMPLANT
PAD OB MATERNITY 4.3X12.25 (PERSONAL CARE ITEMS) ×2 IMPLANT
SEAL CERVICAL OMNI LOK (ABLATOR) IMPLANT
SEAL ROD LENS SCOPE MYOSURE (ABLATOR) ×2 IMPLANT
TOWEL OR 17X26 10 PK STRL BLUE (TOWEL DISPOSABLE) ×2 IMPLANT
WATER STERILE IRR 500ML POUR (IV SOLUTION) ×2 IMPLANT

## 2020-11-22 NOTE — Op Note (Signed)
11/22/2020  11:03 AM  PATIENT:  Amy Williamson  70 y.o. female  PRE-OPERATIVE DIAGNOSIS:  POST MENOPAUSAL BLEEDING, thickened endometrium  POST-OPERATIVE DIAGNOSIS:  POST MENOPAUSAL BLEEDING, thickened endometrium. Endocervical polyp  PROCEDURE:  Procedure(s): DILATATION & CURETTAGE/HYSTEROSCOPY WITH MYOSURE  SURGEON:  Megan Salon  ASSISTANTS: OR staff.    ANESTHESIA:   general  ESTIMATED BLOOD LOSS: 5 mL  BLOOD ADMINISTERED:none   FLUIDS: 500cc LR  UOP: voided before going back to OR  SPECIMEN:  Endocervical polyp(s) and endometrial curettings  DISPOSITION OF SPECIMEN:  PATHOLOGY  FINDINGS: endocervical polyp(s) noted as well as thin endometrium  DESCRIPTION OF OPERATION: Patient was taken to the operating room.  She is placed in the supine position. SCDs were on her lower extremities and functioning properly. General anesthesia with an LMA was administered without difficulty. Dr. Doloris Hall, anesthesia, oversaw case.  Legs were then placed in the Sullivan in the low lithotomy position. The legs were lifted to the high lithotomy position and the Betadine prep was used on the inner thighs perineum and vagina x3. Patient was draped in a normal standard fashion.  A bivalve speculum was placed the vagina. The anterior lip of the cervix was grasped with single-tooth tenaculum.  A paracervical block of 1% lidocaine mixed one-to-one with epinephrine (1:100,000 units).  10 cc was used total. The cervix is dilated up to #21 [facility] dilators. The endometrial cavity sounded to 7 cm.   A Mysosure diagnostic hysteroscope was obtained. Normal saline was used as a hysteroscopic fluid. The hysteroscope was advanced through the endocervical canal into the endometrial cavity. The tubal ostia were noted bilaterally. Additional findings included endocervical polyp(s).  Using the Myosure lite device, the endometrium was sampled and the endocervical polyp resected.  Then the hysteroscope was  removed. A #1 toothed curette was used to curette the cavity until rough gritty texture was noted in all quadrants. With revisualization of the hysteroscope, there was no longer any abnormal findings. At this point no other procedure was needed and this procedure was ended. The hysteroscope was removed. The fluid deficit was 150 cc. The tenaculum was removed from the anterior lip of the cervix. The speculum was removed from the vagina. The prep was cleansed of the patient's skin. The legs are positioned back in the supine position. Sponge, lap, needle, initially counts were correct x2. Patient was taken to recovery in stable condition.  COUNTS:  YES  PLAN OF CARE: Transfer to PACU

## 2020-11-22 NOTE — Anesthesia Preprocedure Evaluation (Addendum)
Anesthesia Evaluation  Patient identified by MRN, date of birth, ID band Patient awake    Reviewed: Allergy & Precautions, NPO status , Patient's Chart, lab work & pertinent test results  History of Anesthesia Complications Negative for: history of anesthetic complications  Airway Mallampati: II  TM Distance: >3 FB Neck ROM: Full    Dental  (+) Missing,    Pulmonary sleep apnea and Continuous Positive Airway Pressure Ventilation ,    Pulmonary exam normal        Cardiovascular hypertension, Pt. on medications Normal cardiovascular exam+ dysrhythmias Atrial Fibrillation   TTE 07/2020: EF 60-65%, grade II DD, valves ok   Neuro/Psych Depression negative neurological ROS     GI/Hepatic Neg liver ROS, GERD  Medicated and Controlled,  Endo/Other  Morbid obesity (BMI 41)  Renal/GU negative Renal ROS  negative genitourinary   Musculoskeletal  (+) Arthritis ,   Abdominal   Peds  Hematology negative hematology ROS (+)   Anesthesia Other Findings Day of surgery medications reviewed with patient.  Reproductive/Obstetrics negative OB ROS                            Anesthesia Physical Anesthesia Plan  ASA: III  Anesthesia Plan: General   Post-op Pain Management:    Induction: Intravenous  PONV Risk Score and Plan: 3 and Treatment may vary due to age or medical condition, Ondansetron and Dexamethasone  Airway Management Planned: LMA  Additional Equipment: None  Intra-op Plan:   Post-operative Plan: Extubation in OR  Informed Consent: I have reviewed the patients History and Physical, chart, labs and discussed the procedure including the risks, benefits and alternatives for the proposed anesthesia with the patient or authorized representative who has indicated his/her understanding and acceptance.     Dental advisory given  Plan Discussed with: CRNA  Anesthesia Plan Comments:         Anesthesia Quick Evaluation

## 2020-11-22 NOTE — Transfer of Care (Signed)
Immediate Anesthesia Transfer of Care Note  Patient: Amy Williamson  Procedure(s) Performed: Procedure(s) (LRB): DILATATION & CURETTAGE/HYSTEROSCOPY WITH MYOSURE (N/A)  Patient Location: PACU  Anesthesia Type: General  Level of Consciousness: awake, oriented, sedated and patient cooperative  Airway & Oxygen Therapy: Patient Spontanous Breathing and Patient connected to face mask oxygen  Post-op Assessment: Report given to PACU RN and Post -op Vital signs reviewed and stable  Post vital signs: Reviewed and stable  Complications: No apparent anesthesia complications  Last Vitals:  Vitals Value Taken Time  BP    Temp    Pulse    Resp    SpO2      Last Pain:  Vitals:   11/22/20 0900  TempSrc: Oral  PainSc: 0-No pain      Patients Stated Pain Goal: 4 (09/02/13 5208)  Complications: No complications documented.

## 2020-11-22 NOTE — H&P (Signed)
Amy Williamson is an 70 y.o. female G2P2 MWF with PMP bleeding.  Ultrasound showed endometrium to measure 75mm.  She is on eliquis and after decreasing this, the bleeding has improved.  Endometrial biopsy showed inactive endometrium.  Due to continued bleeding, hysteroscopy with possible polyp resection, dilation and curettage for additional evaluation and hopefully treatment of her bleeding.  Risks, benefits and alternatives of procedure discussed.  As endometrium is 51mm, I do feel additional evaluation is recommended.    Pertinent Gynecological History: Menses: post-menopausal Bleeding: post menopausal bleeding Contraception: post menopausal status DES exposure: denies Blood transfusions: none Sexually transmitted diseases: no past history Previous GYN Procedures: history of cesarean section x 2  Last mammogram: abnormal 05/2020 with lumpectomy ultimately showing papilloma Last pap: normal Date: 11/07/2020 OB History: G2, P2   Menstrual History: Patient's last menstrual period was 07/23/2004.    Past Medical History:  Diagnosis Date  . Anticoagulant long-term use    eliquis --- managed by cardiology  . Arthritis    BACK AND SHOULDERS  . Depressive disorder, not elsewhere classified   . Essential hypertension    followed by pcp and cardiology  . Frequency of urination   . GERD (gastroesophageal reflux disease)   . History of dysphagia 2015   s/p egd w/ dilation, pt did not have stricture  . History of gastritis   . OSA (obstructive sleep apnea)    followed by dr t. turner---  study in epic 09-03-2020 moderate osa ,  pt scheduled for cpap titration 12-08-2020  . [facility]paroxysmal [facility] fibrillation[facility]    cardiologist-- [name]---- new onset 06-30-2020 at pcp office visit,  s/p DCCV 07-28-2020 successful  . Palpitations 06/2020   11-16-2020  per pt denies palpitations since DCCV 07-28-2020  . Plantar fasciitis 08/2017   left foot   . PMB (postmenopausal bleeding)    . Pure hypercholesterolemia     Past Surgical History:  Procedure Laterality Date  . BREAST LUMPECTOMY WITH RADIOACTIVE SEED LOCALIZATION Left 06/09/2020   Procedure: LEFT BREAST LUMPECTOMY WITH RADIOACTIVE SEED LOCALIZATION;  Surgeon: Coralie Keens, MD;  Location: [place];  Service: General;  Laterality: Left;  LMA  . CARDIOVERSION N/A 07/28/2020   Procedure: CARDIOVERSION;  Surgeon: Werner Lean, MD;  Location: [place] [facility];  Service: Cardiovascular;  Laterality: N/A;  . CARPAL TUNNEL RELEASE Bilateral right 2004;  left 2006  . CATARACT EXTRACTION W/ INTRAOCULAR LENS  IMPLANT, BILATERAL  2016  . CESAREAN SECTION  [id number]   BILATERAL TUBAL LIGATION W/ LAST C/S  . COLONOSCOPY WITH PROPOFOL  last one 08/ 2013  . EAR CYST EXCISION  08/03/2011   patient states this is incorrect  . ESOPHAGOGASTRODUODENOSCOPY (EGD) WITH ESOPHAGEAL DILATION  08/2013  . KNEE ARTHROSCOPY W/ MENISCAL REPAIR Left 08-03-2011  @[facility]    and excision baker's cyst  . PULLEY RELEASE RIGHT TRIGGER FINGER Right 01/08/2011  . TRIGGER FINGER RELEASE Bilateral left 05/06/14;  right  02-29-2014    Family History  Problem Relation Age of Onset  . Heart failure Mother   . Osteoporosis Mother   . Hyperlipidemia Mother   . Hypertension Mother   . Heart disease Father   . Clotting disorder Father   . Liver cancer Brother   . Liver disease Maternal Aunt        x2  . Colon cancer Neg Hx     Social History:  reports that she has never smoked. She has never used smokeless tobacco. She reports that  she does not drink alcohol and does not use drugs.  Allergies:  Allergies  Allergen Reactions  . Atenolol     Decreased blood pressure  . Avelox [Moxifloxacin Hcl In Nacl]     Pt did not like side effects  . Azithromycin     ?causes muscle weakness, dysphagia  . Doxycycline Nausea Only    And heartburn  . Myrbetriq [Mirabegron]     Increased blood pressure  . Oxybutynin Palpitations     Per pt made her have rapid heart beat    Medications Prior to Admission  Medication Sig Dispense Refill Last Dose  . Acetaminophen (TYLENOL ARTHRITIS PAIN PO) Take 2 tablets by mouth 2 (two) times daily as needed.   11/21/2020 at Unknown time  . amLODipine (NORVASC) 5 MG tablet TAKE 1 TABLET BY MOUTH EVERY DAY (Patient taking differently: Take 5 mg by mouth daily.) 90 tablet 2 11/22/2020 at 0600  . apixaban (ELIQUIS) 5 MG TABS tablet Take 1 tablet (5 mg total) by mouth 2 (two) times daily. 60 tablet 6 11/19/2020  . diphenhydramine-acetaminophen (TYLENOL PM) 25-500 MG TABS tablet Take 1 tablet by mouth at bedtime. Sleep   11/21/2020 at Unknown time  . loratadine (CLARITIN) 10 MG tablet Take 10 mg by mouth at bedtime. Sinus drainage   11/21/2020 at Unknown time  . omeprazole (PRILOSEC) 20 MG capsule TAKE 1 CAPSULE BY MOUTH EVERY DAY 90 capsule 3 11/22/2020 at 0600  . simvastatin (ZOCOR) 20 MG tablet TAKE 1 TABLET BY MOUTH EVERYDAY AT BEDTIME (Patient taking differently: Take 20 mg by mouth at bedtime.) 90 tablet 1 11/21/2020 at Unknown time  . eszopiclone (LUNESTA) 2 MG TABS tablet Take I tablet upon arrival to sleep lab. (Patient not taking: Reported on 11/16/2020) 1 tablet 0 Not Taking at Unknown time  . triamcinolone (NASACORT) 55 MCG/ACT AERO nasal inhaler Place 2 sprays into the nose daily as needed (allergies).   More than a month at Unknown time    Review of Systems  Genitourinary: Positive for vaginal bleeding.  All other systems reviewed and are negative.   Blood pressure (!) 161/77, pulse 74, temperature 98 F (36.7 C), temperature source Oral, resp. rate 17, height 5' (1.524 m), weight 95.2 kg, last menstrual period 07/23/2004, SpO2 100 %. Physical Exam Constitutional:      Appearance: Normal appearance.  Cardiovascular:     Rate and Rhythm: Normal rate and regular rhythm.     Pulses: Normal pulses.     Heart sounds: Normal heart sounds.  Pulmonary:     Effort: Pulmonary effort is normal.   Skin:    General: Skin is warm.  Neurological:     General: No focal deficit present.     Mental Status: She is alert.  Psychiatric:        Mood and Affect: Mood normal.     Results for orders placed or performed during the hospital encounter of 11/22/20 (from the past 24 hour(s))  I-STAT, chem 8     Status: Abnormal   Collection Time: 11/22/20  9:19 AM  Result Value Ref Range   Sodium 139 135 - 145 mmol/L   Potassium 4.3 3.5 - 5.1 mmol/L   Chloride 101 98 - 111 mmol/L   BUN 32 (H) 8 - 23 mg/dL   Creatinine, Ser 0.90 0.44 - 1.00 mg/dL   Glucose, Bld 88 70 - 99 mg/dL   Calcium, Ion 1.28 1.15 - 1.40 mmol/L   TCO2 31 22 - 32 mmol/L  Hemoglobin 14.3 12.0 - 15.0 g/dL   HCT 42.0 36.0 - 46.0 %  CBC     Status: None   Collection Time: 11/22/20  9:21 AM  Result Value Ref Range   WBC 9.2 4.0 - 10.5 K/uL   RBC 4.71 3.87 - 5.11 MIL/uL   Hemoglobin 13.5 12.0 - 15.0 g/dL   HCT 41.7 36.0 - 46.0 %   MCV 88.5 80.0 - 100.0 fL   MCH 28.7 26.0 - 34.0 pg   MCHC 32.4 30.0 - 36.0 g/dL   RDW 14.6 11.5 - 15.5 %   Platelets 185 150 - 400 K/uL   nRBC 0.0 0.0 - 0.2 %    No results found.  Assessment/Plan: 70 yo G2P2 WMF with postmenopausal bleeding, 3mm endometrium, negative endometrial biopsy here for hysteroscopy, possible polyp resection, D&C.  Questions answered.  Pt ready to proceed.  Eliquis has been held for two days by cardiology, Dr. Gasper Sells.   Megan Salon 11/22/2020, 10:10 AM

## 2020-11-22 NOTE — Anesthesia Postprocedure Evaluation (Signed)
Anesthesia Post Note  Patient: AARTHI UYENO  Procedure(s) Performed: DILATATION & CURETTAGE/HYSTEROSCOPY WITH MYOSURE (N/A Vagina )     Patient location during evaluation: PACU Anesthesia Type: General Level of consciousness: awake and alert and oriented Pain management: pain level controlled Vital Signs Assessment: post-procedure vital signs reviewed and stable Respiratory status: spontaneous breathing, nonlabored ventilation and respiratory function stable Cardiovascular status: blood pressure returned to baseline Postop Assessment: no apparent nausea or vomiting Anesthetic complications: no   No complications documented.  Last Vitals:  Vitals:   11/22/20 1115 11/22/20 1130  BP: (!) 143/64 (!) 144/73  Pulse: 73 70  Resp: 16 13  Temp:  37.1 C  SpO2: 98% 100%    Last Pain:  Vitals:   11/22/20 1101  TempSrc:   PainSc: 0-No pain                 Brennan Bailey

## 2020-11-22 NOTE — Discharge Instructions (Addendum)
Post-surgical Instructions, Outpatient Surgery  You may expect to feel dizzy, weak, and drowsy for as long as 24 hours after receiving the medicine that made you sleep (anesthetic). For the first 24 hours after your surgery:    Do not drive a car, ride a bicycle, participate in physical activities, or take public transportation until you are done taking narcotic pain medicines or as directed by Dr. Sabra Heck.   Do not drink alcohol or take tranquilizers.   Do not take medicine that has not been prescribed by your physicians.   Do not sign important papers or make important decisions while on narcotic pain medicines.   Have a responsible person with you.   Please call the office at [phone].  If it's after hours and you have a concern, please page me at [phone].  If it's over a weekend, please call the office and it will tell you how to reach the on call provider.  PAIN MANAGEMENT  You can take Tylenol 500mg , 2 tablets, every 6 hours as needed for pain.  If pain is more significant, take 1 Vicodin 5/325mg every six hours as needed for pain control.  Don't take extra tylenol if you take the pain medications.  (Remember that narcotic pain medications increase your risk of constipation.  If this becomes a problem, you may take an over the counter stool softener like Colace 100mg  up to four times a day.)  DO'S AND DON'T'S  Do not take a tub bath for one week.  You may shower on the first day after your surgery  Do not do any heavy lifting for one to two weeks.  This increases the chance of bleeding.  Do move around as you feel able.  Stairs are fine.  You may begin to exercise again as you feel able.  Do not lift any weights for two weeks.  Do not put anything in the vagina for two weeks--no tampons, intercourse, or douching.    REGULAR MEDIATIONS/VITAMINS:  You may restart all of your regular medications as prescribed.  You may restart all of your vitamins as you normally take  them.    PLEASE CALL OR SEEK MEDICAL CARE IF:  You have persistent nausea and vomiting.   You have trouble eating or drinking.   You have an oral temperature above 100.5.   You have constipation that is not helped by adjusting diet or increasing fluid intake. Pain medicines are a common cause of constipation.   You have heavy vaginal bleeding   Post Anesthesia Home Care Instructions  Activity: Get plenty of rest for the remainder of the day. A responsible individual must stay with you for 24 hours following the procedure.  For the next 24 hours, DO NOT: -Drive a car -Paediatric nurse -Drink alcoholic beverages -Take any medication unless instructed by your physician -Make any legal decisions or sign important papers.  Meals: Start with liquid foods such as gelatin or soup. Progress to regular foods as tolerated. Avoid greasy, spicy, heavy foods. If nausea and/or vomiting occur, drink only clear liquids until the nausea and/or vomiting subsides. Call your physician if vomiting continues.  Special Instructions/Symptoms: Your throat may feel dry or sore from the anesthesia or the breathing tube placed in your throat during surgery. If this causes discomfort, gargle with warm salt water. The discomfort should disappear within 24 hours.

## 2020-11-22 NOTE — Anesthesia Procedure Notes (Signed)
Procedure Name: LMA Insertion Date/Time: 11/22/2020 10:29 AM Performed by: Suan Halter, CRNA Pre-anesthesia Checklist: Patient identified, Emergency Drugs available, Suction available and Patient being monitored Patient Re-evaluated:Patient Re-evaluated prior to induction Oxygen Delivery Method: Circle system utilized Preoxygenation: Pre-oxygenation with 100% oxygen Induction Type: IV induction Ventilation: Mask ventilation without difficulty LMA: LMA inserted and LMA with gastric port inserted LMA Size: 4.0 Number of attempts: 2 Airway Equipment and Method: Bite block Placement Confirmation: positive ETCO2 Tube secured with: Tape Dental Injury: Teeth and Oropharynx as per pre-operative assessment  Comments: LMA 4 inserted with poor seal , inserted Supreme LMA 4 with good seal

## 2020-11-23 ENCOUNTER — Encounter (HOSPITAL_BASED_OUTPATIENT_CLINIC_OR_DEPARTMENT_OTHER): Payer: Self-pay | Admitting: Obstetrics & Gynecology

## 2020-11-23 LAB — SURGICAL PATHOLOGY

## 2020-11-28 ENCOUNTER — Other Ambulatory Visit: Payer: Self-pay

## 2020-11-28 ENCOUNTER — Other Ambulatory Visit (HOSPITAL_BASED_OUTPATIENT_CLINIC_OR_DEPARTMENT_OTHER): Payer: Self-pay | Admitting: Obstetrics & Gynecology

## 2020-11-28 ENCOUNTER — Other Ambulatory Visit (HOSPITAL_BASED_OUTPATIENT_CLINIC_OR_DEPARTMENT_OTHER)
Admission: RE | Admit: 2020-11-28 | Discharge: 2020-11-28 | Disposition: A | Discharge status: Home / Self Care | Payer: Medicare Other | Source: Ambulatory Visit | Attending: Obstetrics & Gynecology | Admitting: Obstetrics & Gynecology

## 2020-11-28 ENCOUNTER — Encounter (HOSPITAL_BASED_OUTPATIENT_CLINIC_OR_DEPARTMENT_OTHER): Payer: Self-pay

## 2020-11-28 DIAGNOSIS — N950 Postmenopausal bleeding: Secondary | ICD-10-CM | POA: Insufficient documentation

## 2020-11-28 LAB — CBC
HCT: 40.3 % (ref 36.0–46.0)
Hemoglobin: 12.9 g/dL (ref 12.0–15.0)
MCH: 28.3 pg (ref 26.0–34.0)
MCHC: 32 g/dL (ref 30.0–36.0)
MCV: 88.4 fL (ref 80.0–100.0)
Platelets: 188 10*3/uL (ref 150–400)
RBC: 4.56 MIL/uL (ref 3.87–5.11)
RDW: 14.6 % (ref 11.5–15.5)
WBC: 9.6 10*3/uL (ref 4.0–10.5)
nRBC: 0 % (ref 0.0–0.2)

## 2020-11-28 LAB — COMPREHENSIVE METABOLIC PANEL
ALT: 20 U/L (ref 0–44)
AST: 19 U/L (ref 15–41)
Albumin: 4.2 g/dL (ref 3.5–5.0)
Alkaline Phosphatase: 63 U/L (ref 38–126)
Anion gap: 8 (ref 5–15)
BUN: 21 mg/dL (ref 8–23)
CO2: 31 mmol/L (ref 22–32)
Calcium: 9.4 mg/dL (ref 8.9–10.3)
Chloride: 103 mmol/L (ref 98–111)
Creatinine, Ser: 0.85 mg/dL (ref 0.44–1.00)
GFR, Estimated: >60 mL/min (ref >60)
Glucose, Bld: 92 mg/dL (ref 70–99)
Potassium: 4.2 mmol/L (ref 3.5–5.1)
Sodium: 142 mmol/L (ref 135–145)
Total Bilirubin: 0.5 mg/dL (ref 0.3–1.2)
Total Protein: 6.6 g/dL (ref 6.5–8.1)

## 2020-11-28 NOTE — Telephone Encounter (Signed)
Per Dr. Sabra Heck patient to come in to have bloodwork drawn. Spoke with patient and she will be coming in at 11:00. She has been added on the lab schedule. tbw

## 2020-11-28 NOTE — Telephone Encounter (Signed)
Called patient about her mychart message. Patient states that the bleeding today is a lot better today than yesterday. She states that yesterday it was like the "flood gates" were opened. She also says she did not do anything that would have caused her to start bleeding like that. She has been taking it easy, doing nothing. She feels like her energy level is nothing right now. She feels to weak sometimes to even do anything. I informed patient that I would speak with Dr. Sabra Heck and call her back as she may have to come in to have blood drawn. tbw

## 2020-11-28 NOTE — Telephone Encounter (Signed)
Can you call her and assess her bleeding as well as her fatigue.  She may need a CBC and an appt.

## 2020-11-28 NOTE — Telephone Encounter (Signed)
Patient wanted to know if you could speak to cardiologist about the Eliquis that she is taking. I did inform her that you would be speaking to her cardiologist about the bleeding. tbw

## 2020-11-28 NOTE — Telephone Encounter (Signed)
-----   Message from Megan Salon, MD sent at 11/28/2020 12:51 PM EDT ----- Please let pt know her CMP is normal and CBC is normal including a normal hemoglobin (so no anemia).  Please ask her to keep me posted.  I'm going to let her cardiologist know about the bleeding as well.  Thanks.

## 2020-12-02 ENCOUNTER — Encounter (HOSPITAL_BASED_OUTPATIENT_CLINIC_OR_DEPARTMENT_OTHER): Payer: Medicare Other | Admitting: Cardiology

## 2020-12-08 ENCOUNTER — Other Ambulatory Visit: Payer: Self-pay

## 2020-12-08 ENCOUNTER — Ambulatory Visit (HOSPITAL_BASED_OUTPATIENT_CLINIC_OR_DEPARTMENT_OTHER): Payer: Medicare Other | Attending: Cardiology | Admitting: Cardiology

## 2020-12-08 DIAGNOSIS — G4733 Obstructive sleep apnea (adult) (pediatric): Secondary | ICD-10-CM | POA: Insufficient documentation

## 2020-12-09 NOTE — Telephone Encounter (Signed)
Called pt personally.  She's had no other bleeding.  eliquis dosage should stay the same unless has significant and continued bleeding.  She is advised to call with any new bleeding in the future.  Voiced clear understanding and appreciation for phone call.  Ok to close communication.

## 2020-12-12 ENCOUNTER — Telehealth: Payer: Self-pay | Admitting: Internal Medicine

## 2020-12-12 NOTE — Telephone Encounter (Signed)
Called patient in regards to palpitations. She reports that for the past 4-5 days she has had an irregular heart beat.  She can hear heart beat in her right hear.  It sounds like its skipping a beat.  She checks her BP 3 times per week last 3 readings are as follows: 5/13 113/72-72               5/19 139/75-74    5/23  143/83 and 136/82-65 She has only missed eliquis for 2 days prior to [facility] on 5/3.  She has not missed any other doses.  She denies SOB/ DOE.  She is requesting an urgent appointment with Dr. Gasper Sells.  I informed her that she has a history of atrial fibrillation, the heart rates that she reported are not alarming, and she is taking eliquis as ordered so an urgent appointment is not needed at this time.  I informed her that if her HR increases to 110-120's to call in.  I will route her concerns to Dr. Gasper Sells to see if he would like to make any changes.  If any changes are needed I will call her back.

## 2020-12-12 NOTE — Telephone Encounter (Signed)
Patient c/o Palpitations:  High priority if patient c/o lightheadedness, shortness of breath, or chest pain  1) How long have you had palpitations/irregular HR/ Afib? Are you having the symptoms now?  Yes, Irregular Heartbeat 5 days ago  2) Are you currently experiencing lightheadedness, SOB or CP?  Chest Pressure Not pain, lightheadedness  3) Do you have a history of afib (atrial fibrillation) or irregular heart rhythm? Yes since December 2021  4) Have you checked your BP or HR? (document readings if available): 136/81 HR 65  5) Are you experiencing any other symptoms? lightheadedness  ?

## 2020-12-14 MED ORDER — ROSUVASTATIN CALCIUM 10 MG PO TABS: 10.0000 mg | ORAL_TABLET | Freq: Every day | ORAL | 3 refills | Status: DC

## 2020-12-14 MED ORDER — DILTIAZEM HCL ER COATED BEADS 240 MG PO CP24: 240.0000 mg | ORAL_CAPSULE | Freq: Every day | ORAL | 3 refills | Status: DC

## 2020-12-14 NOTE — Telephone Encounter (Signed)
Called patient to notify her of MD recommendations.  She is agreeable to plan and verbalizes understanding. Called patient back to inform her to stop taking simvastatin and start rosuvastatin 10 mg PO daily.  She is agreeable to this plan.

## 2020-12-21 NOTE — Procedures (Signed)
    Patient Name: Amy Williamson, Amy Williamson Date: 12/08/2020 Gender: Female D.O.B: January 12, 1951 Age (years): 37 Referring Provider: Rudean Haskell Height (inches): 61 Interpreting Physician: Fransico Him MD, ABSM Weight (lbs): 215 RPSGT: Baxter Flattery BMI: 41 MRN: [id number] Neck Size: 15.00  CLINICAL INFORMATION The patient is referred for a CPAP titration to treat sleep apnea.  SLEEP STUDY TECHNIQUE As per the AASM Manual for the Scoring of Sleep and Associated Events v2.3 (April 2016) with a hypopnea requiring 4% desaturations.  The channels recorded and monitored were frontal, central and occipital EEG, electrooculogram (EOG), submentalis EMG (chin), nasal and oral airflow, thoracic and abdominal wall motion, anterior tibialis EMG, snore microphone, electrocardiogram, and pulse oximetry. Continuous positive airway pressure (CPAP) was initiated at the beginning of the study and titrated to treat sleep-disordered breathing.  MEDICATIONS Medications self-administered by patient taken the night of the study : METOPROLOL, SIMVASTATIN, TYLENOL PM  TECHNICIAN COMMENTS Comments added by technician: Patient had difficulty initiating sleep. PATIENT TOLERATED THE MASK GOOD, . Respironics Philip Agricultural engineer, Comments added by scorer: N/A  RESPIRATORY PARAMETERS Optimal PAP Pressure (cm): 10  AHI at Optimal Pressure (/hr):0 Overall Minimal O2 (%):84.0  Supine % at Optimal Pressure (%):0 Minimal O2 at Optimal Pressure (%): 90.0   SLEEP ARCHITECTURE The study was initiated at 10:14:32 PM and ended at 4:30:18 AM.  Sleep onset time was 24.5 minutes and the sleep efficiency was 55.1%. The total sleep time was 207 minutes.  The patient spent 3.1% of the night in stage N1 sleep, 72.7% in stage N2 sleep, 0.0% in stage N3 and 24.2% in REM.Stage REM latency was 135.5 minutes  Wake after sleep onset was 144.3. Alpha intrusion was absent. Supine sleep was 11.59%.  CARDIAC  DATA The 2 lead EKG demonstrated sinus rhythm. The mean heart rate was 71.4 beats per minute. Other EKG findings include: None.  LEG MOVEMENT DATA The total Periodic Limb Movements of Sleep (PLMS) were 0. The PLMS index was 0.0. A PLMS index of <15 is considered normal in adults.  IMPRESSIONS - The optimal PAP pressure was 10 cm of water. - Central sleep apnea was not noted during this titration (CAI = 0.3/h). - Moderate oxygen desaturations were observed during this titration (min O2 = 84.0%). - No snoring was audible during this study. - No cardiac abnormalities were observed during this study. - Clinically significant periodic limb movements were not noted during this study. Arousals associated with PLMs were rare.  DIAGNOSIS - Obstructive Sleep Apnea (G47.33)  RECOMMENDATIONS - Trial of CPAP therapy on 10 cm H2O with a Small size Philips Respironics Nasal Pillow Mask DreamWear Gel mask and heated humidification. - Avoid alcohol, sedatives and other CNS depressants that may worsen sleep apnea and disrupt normal sleep architecture. - Sleep hygiene should be reviewed to assess factors that may improve sleep quality. - Weight management and regular exercise should be initiated or continued. - Return to Sleep Center for re-evaluation after 6 weeks of therapy  [Electronically signed] 12/21/2020 06:09 PM  Fransico Him MD, ABSM Diplomate, American Board of Sleep Medicine

## 2020-12-27 ENCOUNTER — Telehealth: Payer: Self-pay | Admitting: *Deleted

## 2020-12-27 NOTE — Telephone Encounter (Signed)
-----   Message from Sueanne Margarita, MD sent at 12/21/2020  6:13 PM EDT ----- Please let patient know that they had a successful PAP titration and let DME know that orders are in EPIC.  Please set up 6 week OV with me.

## 2020-12-27 NOTE — Telephone Encounter (Signed)
The patient has been notified of the result and verbalized understanding.  All questions (if any) were answered. Amy Williamson, [place] 12/27/2020 1:19 PM    Upon patient request DME selection is [facility] Patient understands he will be contacted by [facility] to set up his cpap. Patient understands to call if [place] does not contact him with new setup in a timely manner. Patient understands they will be called once confirmation has been received from [facility] that they have received their new machine to schedule 10 week follow up appointment.   [name] notified of new cpap order  Please add to airview Patient was grateful for the call and thanked me

## 2021-01-04 ENCOUNTER — Other Ambulatory Visit: Payer: Self-pay | Admitting: Internal Medicine

## 2021-01-18 NOTE — Telephone Encounter (Signed)
Patient has a 10 week follow up appointment scheduled for .03/31/2021. Patient understands she needs to keep this appointment for insurance compliance. Patient was grateful for the call and thanked me.

## 2021-01-31 ENCOUNTER — Ambulatory Visit: Payer: Medicare Other | Admitting: Internal Medicine

## 2021-01-31 ENCOUNTER — Other Ambulatory Visit: Payer: Self-pay

## 2021-01-31 ENCOUNTER — Encounter: Payer: Self-pay | Admitting: Internal Medicine

## 2021-01-31 VITALS — BP 140/68 | HR 62 | Ht 60.0 in | Wt 212.0 lb

## 2021-01-31 DIAGNOSIS — I10 Essential (primary) hypertension: Secondary | ICD-10-CM

## 2021-01-31 DIAGNOSIS — I4819 Other persistent atrial fibrillation: Secondary | ICD-10-CM | POA: Diagnosis not present

## 2021-01-31 DIAGNOSIS — I700 Atherosclerosis of aorta: Secondary | ICD-10-CM | POA: Diagnosis not present

## 2021-01-31 DIAGNOSIS — G4733 Obstructive sleep apnea (adult) (pediatric): Secondary | ICD-10-CM

## 2021-01-31 DIAGNOSIS — E782 Mixed hyperlipidemia: Secondary | ICD-10-CM

## 2021-01-31 NOTE — Patient Instructions (Signed)
Medication Instructions:  Your physician recommends that you continue on your current medications as directed. Please refer to the Current Medication list given to you today.  *If you need a refill on your cardiac medications before your next appointment, please call your pharmacy*   Lab Work: TODAY: Lipid panel If you have labs (blood work) drawn today and your tests are completely normal, you will receive your results only by: [place] (if you have MyChart) OR A paper copy in the mail If you have any lab test that is abnormal or we need to change your treatment, we will call you to review the results.   Testing/Procedures: NONE   Follow-Up: At [facility], you and your health needs are our priority.  As part of our continuing mission to provide you with exceptional heart care, we have created designated Provider Care Teams.  These Care Teams include your primary Cardiologist (physician) and Advanced Practice Providers (APPs -  Physician Assistants and Nurse Practitioners) who all work together to provide you with the care you need, when you need it.  Your next appointment:   1 year(s)  The format for your next appointment:   In Person  Provider:   You may see Rudean Haskell, MD or one of the following Advanced Practice Providers on your designated Care Team:   Melina Copa, PA-C Ermalinda Barrios, PA-C

## 2021-01-31 NOTE — Progress Notes (Signed)
Cardiology Office Note:    Date:  01/31/2021   ID:  [name], DOB Feb 05, 1951, MRN [id number]  PCP:  Hoyt Koch, MD  [facility] Cardiologist:  Rudean Haskell MD [place] Electrophysiologist:  None   CC: Atrial Fibrillation Follow up (post surgery)  History of Present Illness:    NYLEAH MCGINNIS is a 70 y.o. female with a hx of new atrial fibrillation, with HTN, Aortic Atherosclerosis and HLD, Morbid Obesity, history of dysphagia who sees ([facility]); prior lumpectomy in 06/09/2020 who presented for evaluation 07/08/20.  In interval had 07/28/20 DCCV.  Has also seen Dr. Radford Pax for OSA.  Had surgery 7/0/62 without complication. In interim of this visit, patient had change in her statin. 01/31/21.  Patient notes that she is doing OK despite having a lot of medical care..  Since last visit notes no starting CPAP but without improvement in her fatigue:  Getting bursitis has terrible leg pain despite injections. Started medication for root canal.  No chest pain or pressure.  No SOB/DOE and no PND/Orthopnea.  No weight gain or leg swelling.  No palpitations or syncope.  Tolerating CP well  Ambulatory blood pressure (forgot log a home),  SBP130-140s heart rate in 50s.   Past Medical History:  Diagnosis Date   Anticoagulant long-term use    eliquis --- managed by cardiology   Arthritis    BACK AND SHOULDERS   Depressive disorder, not elsewhere classified    Essential hypertension    followed by pcp and cardiology   Frequency of urination    GERD (gastroesophageal reflux disease)    History of dysphagia 2015   s/p egd w/ dilation, pt did not have stricture   History of gastritis    OSA (obstructive sleep apnea)    followed by dr t. turner---  study in epic 09-03-2020 moderate osa ,  pt scheduled for cpap titration 12-08-2020   PAF (paroxysmal atrial fibrillation) [facility])    cardiologist-- [name]---- new onset 06-30-2020 at pcp office visit,  s/p  DCCV 07-28-2020 successful   Palpitations 06/2020   11-16-2020  per pt denies palpitations since DCCV 07-28-2020   Plantar fasciitis 08/2017   left foot    PMB (postmenopausal bleeding)    Pure hypercholesterolemia     Past Surgical History:  Procedure Laterality Date   BREAST LUMPECTOMY WITH RADIOACTIVE SEED LOCALIZATION Left 06/09/2020   Procedure: LEFT BREAST LUMPECTOMY WITH RADIOACTIVE SEED LOCALIZATION;  Surgeon: Coralie Keens, MD;  Location: [facility];  Service: General;  Laterality: Left;  LMA   CARDIOVERSION N/A 07/28/2020   Procedure: CARDIOVERSION;  Surgeon: Werner Lean, MD;  Location: [facility];  Service: Cardiovascular;  Laterality: N/A;   CARPAL TUNNEL RELEASE Bilateral right 2004;  left 2006   CATARACT EXTRACTION W/ INTRAOCULAR LENS  IMPLANT, BILATERAL  2016   CESAREAN SECTION  [id number]   BILATERAL TUBAL LIGATION W/ LAST C/S   COLONOSCOPY WITH PROPOFOL  last one 08/ 2013   [place] N/A 11/22/2020   Procedure: [place];  Surgeon: Megan Salon, MD;  Location: [facility];  Service: Gynecology;  Laterality: N/A;   EAR CYST EXCISION  08/03/2011   patient states this is incorrect   ESOPHAGOGASTRODUODENOSCOPY (EGD) WITH ESOPHAGEAL DILATION  08/2013   KNEE ARTHROSCOPY W/ MENISCAL REPAIR Left 08-03-2011  @[facility]    and excision baker's cyst   PULLEY RELEASE RIGHT TRIGGER FINGER Right 01/08/2011   TRIGGER FINGER RELEASE  Bilateral left 05/06/14;  right  02-29-2014    Current Medications: Current Meds  Medication Sig   Acetaminophen (TYLENOL ARTHRITIS PAIN PO) Take 2 tablets by mouth 2 (two) times daily as needed.   amoxicillin (AMOXIL) 500 MG capsule Take 500 mg by mouth 3 (three) times daily.   diltiazem (CARDIZEM CD) 240 MG 24 hr capsule Take 1 capsule (240 mg total) by mouth daily.   diphenhydramine-acetaminophen (TYLENOL PM) 25-500 MG TABS tablet  Take 1 tablet by mouth at bedtime. Sleep   ELIQUIS 5 MG TABS tablet TAKE 1 TABLET BY MOUTH TWICE A DAY   loratadine (CLARITIN) 10 MG tablet Take 10 mg by mouth at bedtime. Sinus drainage   omeprazole (PRILOSEC) 20 MG capsule TAKE 1 CAPSULE BY MOUTH EVERY DAY   rosuvastatin (CRESTOR) 10 MG tablet Take 1 tablet (10 mg total) by mouth daily.   triamcinolone (NASACORT) 55 MCG/ACT AERO nasal inhaler Place 2 sprays into the nose daily as needed (allergies).     Allergies:   Atenolol, Avelox [moxifloxacin hcl in nacl], Azithromycin, Doxycycline, Myrbetriq [mirabegron], and Oxybutynin   Social History   Socioeconomic History   Marital status: Married    Spouse name: david   Number of children: 2   Years of education: Not on file   Highest education level: Not on file  Occupational History   Occupation: retired  Tobacco Use   Smoking status: Never   Smokeless tobacco: Never  Vaping Use   Vaping Use: Never used  Substance and Sexual Activity   Alcohol use: No   Drug use: Never   Sexual activity: Not on file    Comment: BTL  Other Topics Concern   Not on file  Social History Narrative   Not on file   Social Determinants of Health   Financial Resource Strain: Not on file  Food Insecurity: Not on file  Transportation Needs: Not on file  Physical Activity: Not on file  Stress: Not on file  Social Connections: Not on file    Social:  I take care of her nephew  Family History: The patient's family history includes Clotting disorder in her father; Heart disease in her father; Heart failure in her mother; Hyperlipidemia in her mother; Hypertension in her mother; Liver cancer in her brother; Liver disease in her maternal aunt; Osteoporosis in her mother. There is no history of Colon cancer. History of coronary artery disease notable for no members. History of heart failure notable for mother. No history of cardiomyopathies including hypertrophic cardiomyopathy, left ventricular  non-compaction, or arrhythmogenic right ventricular cardiomyopathy. History of arrhythmia notable for AF in mother. Denies family history of sudden cardiac death including drowning, car accidents, or unexplained deaths in the family. No history of bicuspid aortic valve or aortic aneurysm or dissection.  ROS:   Please see the history of present illness.     All other systems reviewed and are negative.  EKGs/Labs/Other Studies Reviewed:    The following studies were reviewed today:  EKG:  01/31/21: SR rate 62 WNL 09/13/2020: SR rate 61 WNL 06/30/20:  A fib 70   Transthoracic Echocardiogram: Date: 08/10/2020 Results: IMPRESSIONS   1. Left ventricular ejection fraction, by estimation, is 60 to 65%. The  left ventricle has normal function. The left ventricle has no regional  wall motion abnormalities. Left ventricular diastolic parameters are  consistent with Grade II diastolic  dysfunction (pseudonormalization). Elevated left ventricular end-diastolic  pressure.   2. Right ventricular systolic function is normal. The right ventricular  size is normal.   3. The mitral valve is normal in structure. Trivial mitral valve  regurgitation. No evidence of mitral stenosis.   4. The aortic valve is tricuspid. Aortic valve regurgitation is not  visualized. No aortic stenosis is present.   5. The inferior vena cava is normal in size with greater than 50%  respiratory variability, suggesting right atrial pressure of 3 mmHg.   NonCardiac CT: Date: 09/23/2013 Results: No coronary calcification; Aortic Arch Calcifications  Recent Labs: 06/30/2020: TSH 2.22 11/28/2020: ALT 20; BUN 21; Creatinine, Ser 0.85; Hemoglobin 12.9; Platelets 188; Potassium 4.2; Sodium 142  Recent Lipid Panel    Component Value Date/Time   CHOL 177 06/30/2020 0903   TRIG 116.0 06/30/2020 0903   TRIG 54 06/25/2006 0725   HDL 61.40 06/30/2020 0903   CHOLHDL 3 06/30/2020 0903   VLDL 23.2 06/30/2020 0903   LDLCALC 92  06/30/2020 0903   LDLDIRECT 142.3 03/16/2013 0732    Risk Assessment/Calculations:     CHA2DS2-VASc Score = 3  This indicates a 3.2% annual risk of stroke. The patient's score is based upon: CHF History: No HTN History: Yes Diabetes History: No Stroke History: No Vascular Disease History: No Age Score: 1 Gender Score: 1     HASBLED 3: Risk was 5.8% in one validation study (Lip 2011) and 3.72 bleeds per 100 patient-years in another validation study ([place] 2010).  Physical Exam:    VS:  BP 140/68   Pulse 62   Ht 5' (1.524 m)   Wt 96.2 kg   LMP 07/23/2004   SpO2 99%   BMI 41.40 kg/m     Wt Readings from Last 3 Encounters:  01/31/21 96.2 kg  12/08/20 97.5 kg  11/22/20 95.2 kg    GEN: Obese, well developed in no acute distress HEENT: Normal NECK: No JVD; No carotid bruits LYMPHATICS: No lymphadenopathy CARDIAC: regular rate and rhythm, no murmurs, rubs, gallops RESPIRATORY:  Clear to auscultation without rales, wheezing or rhonchi  ABDOMEN: Soft, non-tender, non-distended MUSCULOSKELETAL:  No edema; No deformity  SKIN: Warm and dry NEUROLOGIC:  Alert and oriented x 3 PSYCHIATRIC:  Normal affect   ASSESSMENT:    1. Aortic atherosclerosis ([place])   2. Mixed hyperlipidemia   3. Persistent atrial fibrillation ([place])   4. OSA (obstructive sleep apnea)   5. Essential hypertension     PLAN:    In order of problems listed above:  Persistent Atrial Fibrillation s/p Cardioversion (presently in SR) OSA - back on CPAP Morbid Obesity Essential Hypertension Aortic Atherosclerosis and HLD Tooth pain planned for Root canal - CHADSVASC=3, HASBLED 3. -LDL goal less than 70; check lipids today; continue rosuvastatin 10; reviewed CT and Atherosclerosis with patient - continue diltiazem XL 240 -continue Eliquis 5 mg BID - CPAP should help control of slight increase in amb BP (will continue)  One year follow up unless new symptoms or abnormal test results warranting  change in plan  Would be reasonable for  APP Follow up    Medication Adjustments/Labs and Tests Ordered: Current medicines are reviewed at length with the patient today.  Concerns regarding medicines are outlined above.  Orders Placed This Encounter  Procedures   Lipid panel   EKG 12-Lead    No orders of the defined types were placed in this encounter.   Patient Instructions  Medication Instructions:  Your physician recommends that you continue on your current medications as directed. Please refer to the Current Medication list given to you today.  *If  you need a refill on your cardiac medications before your next appointment, please call your pharmacy*   Lab Work: TODAY: Lipid panel If you have labs (blood work) drawn today and your tests are completely normal, you will receive your results only by: [place] (if you have MyChart) OR A paper copy in the mail If you have any lab test that is abnormal or we need to change your treatment, we will call you to review the results.   Testing/Procedures: NONE   Follow-Up: At [facility], you and your health needs are our priority.  As part of our continuing mission to provide you with exceptional heart care, we have created designated Provider Care Teams.  These Care Teams include your primary Cardiologist (physician) and Advanced Practice Providers (APPs -  Physician Assistants and Nurse Practitioners) who all work together to provide you with the care you need, when you need it.  Your next appointment:   1 year(s)  The format for your next appointment:   In Person  Provider:   You may see Rudean Haskell, MD or one of the following Advanced Practice Providers on your designated Care Team:   Melina Copa, PA-C Ermalinda Barrios, PA-C        Signed, Werner Lean, MD  01/31/2021 9:23 AM    [place]

## 2021-02-03 LAB — LIPID PANEL
Chol/HDL Ratio: 2.7 ratio (ref 0.0–4.4)
Cholesterol, Total: 171 mg/dL (ref 100–199)
HDL: 63 mg/dL (ref >39)
LDL Chol Calc (NIH): 88 mg/dL (ref 0–99)
Triglycerides: 115 mg/dL (ref 0–149)
VLDL Cholesterol Cal: 20 mg/dL (ref 5–40)

## 2021-02-06 DIAGNOSIS — E782 Mixed hyperlipidemia: Secondary | ICD-10-CM

## 2021-02-06 DIAGNOSIS — I700 Atherosclerosis of aorta: Secondary | ICD-10-CM

## 2021-02-06 MED ORDER — ROSUVASTATIN CALCIUM 20 MG PO TABS: 20.0000 mg | ORAL_TABLET | Freq: Every day | ORAL | 3 refills | Status: DC

## 2021-02-06 NOTE — Telephone Encounter (Signed)
-----   Message from Werner Lean, MD sent at 02/05/2021  3:01 PM EDT ----- Results: LDL above goal  Action: Increase rosuvastatin to 20 mg and recheck in three months

## 2021-02-10 ENCOUNTER — Encounter: Payer: Self-pay | Admitting: Internal Medicine

## 2021-02-13 ENCOUNTER — Other Ambulatory Visit: Payer: Self-pay

## 2021-02-13 MED ORDER — APIXABAN 5 MG PO TABS: 5.0000 mg | ORAL_TABLET | Freq: Two times a day (BID) | ORAL | 3 refills | Status: DC

## 2021-02-27 ENCOUNTER — Encounter: Payer: Self-pay | Admitting: Internal Medicine

## 2021-03-23 ENCOUNTER — Ambulatory Visit
Admission: EM | Admit: 2021-03-23 | Discharge: 2021-03-23 | Disposition: A | Discharge status: Home / Self Care | Payer: Medicare Other | Attending: Internal Medicine | Admitting: Internal Medicine

## 2021-03-23 ENCOUNTER — Other Ambulatory Visit: Payer: Self-pay

## 2021-03-23 ENCOUNTER — Ambulatory Visit (INDEPENDENT_AMBULATORY_CARE_PROVIDER_SITE_OTHER): Payer: Medicare Other

## 2021-03-23 DIAGNOSIS — R059 Cough, unspecified: Secondary | ICD-10-CM | POA: Diagnosis not present

## 2021-03-23 DIAGNOSIS — R0602 Shortness of breath: Secondary | ICD-10-CM | POA: Diagnosis not present

## 2021-03-23 DIAGNOSIS — J208 Acute bronchitis due to other specified organisms: Secondary | ICD-10-CM

## 2021-03-23 DIAGNOSIS — R062 Wheezing: Secondary | ICD-10-CM

## 2021-03-23 MED ORDER — PREDNISONE 10 MG (21) PO TBPK: ORAL_TABLET | Freq: Every day | ORAL | 0 refills | Status: DC

## 2021-03-23 MED ORDER — ALBUTEROL SULFATE HFA 108 (90 BASE) MCG/ACT IN AERS: 1.0000 | INHALATION_SPRAY | Freq: Four times a day (QID) | RESPIRATORY_TRACT | 0 refills | Status: DC | PRN

## 2021-03-23 NOTE — ED Provider Notes (Signed)
[facility]    CSN: [id number] Arrival date & time: 03/23/21  0955      History   Chief Complaint Chief Complaint  Patient presents with   Cough    HPI JUAN BERNSTEIN is a 70 y.o. female.   Patient presents with cough, wheezing, intermittent shortness of breath that has been present for over a week.  Also having some nasal congestion.  Denies any headache, body aches, chills, nausea, vomiting, diarrhea, abdominal pain, chest pain.  Has had some "low-grade fever".  Recently has some dental evaluation and was on amoxicillin for 10 days for dental infection that she completed yesterday.   Cough  Past Medical History:  Diagnosis Date   Anticoagulant long-term use    eliquis --- managed by cardiology   Arthritis    BACK AND SHOULDERS   Depressive disorder, not elsewhere classified    Essential hypertension    followed by pcp and cardiology   Frequency of urination    GERD (gastroesophageal reflux disease)    History of dysphagia 2015   s/p egd w/ dilation, pt did not have stricture   History of gastritis    OSA (obstructive sleep apnea)    followed by dr t. turner---  study in epic 09-03-2020 moderate osa ,  pt scheduled for cpap titration 12-08-2020   PAF (paroxysmal atrial fibrillation) [facility])    cardiologist-- [name]---- new onset 06-30-2020 at pcp office visit,  s/p DCCV 07-28-2020 successful   Palpitations 06/2020   11-16-2020  per pt denies palpitations since DCCV 07-28-2020   Plantar fasciitis 08/2017   left foot    PMB (postmenopausal bleeding)    Pure hypercholesterolemia     Patient Active Problem List   Diagnosis Date Noted   OSA (obstructive sleep apnea) 09/13/2020   Persistent atrial fibrillation ([place]) 07/08/2020   Mixed hyperlipidemia 07/08/2020   Aortic atherosclerosis ([place]) 06/30/2020   Bronchitis 06/12/2019   Routine general medical examination at a health care facility 04/02/2016   Other dysphagia 09/21/2013   Morbid  obesity ([place]) 03/24/2013   DJD (degenerative joint disease) 03/24/2013   GERD (gastroesophageal reflux disease) 02/27/2012   Hyperlipidemia 01/21/2008   Essential hypertension 01/21/2008    Past Surgical History:  Procedure Laterality Date   BREAST LUMPECTOMY WITH RADIOACTIVE SEED LOCALIZATION Left 06/09/2020   Procedure: LEFT BREAST LUMPECTOMY WITH RADIOACTIVE SEED LOCALIZATION;  Surgeon: Coralie Keens, MD;  Location: [place];  Service: General;  Laterality: Left;  LMA   CARDIOVERSION N/A 07/28/2020   Procedure: CARDIOVERSION;  Surgeon: Werner Lean, MD;  Location: [place] [facility];  Service: Cardiovascular;  Laterality: N/A;   CARPAL TUNNEL RELEASE Bilateral right 2004;  left 2006   CATARACT EXTRACTION W/ INTRAOCULAR LENS  IMPLANT, BILATERAL  2016   CESAREAN SECTION  [id number]   BILATERAL TUBAL LIGATION W/ LAST C/S   COLONOSCOPY WITH PROPOFOL  last one 08/ 2013   [place] N/A 11/22/2020   Procedure: [place];  Surgeon: Megan Salon, MD;  Location: [place];  Service: Gynecology;  Laterality: N/A;   EAR CYST EXCISION  08/03/2011   patient states this is incorrect   ESOPHAGOGASTRODUODENOSCOPY (EGD) WITH ESOPHAGEAL DILATION  08/2013   KNEE ARTHROSCOPY W/ MENISCAL REPAIR Left 08-03-2011  '@[facility]'$    and excision baker's cyst   PULLEY RELEASE RIGHT TRIGGER FINGER Right 01/08/2011   TRIGGER FINGER RELEASE Bilateral left 05/06/14;  right  02-29-2014    OB History  Gravida  2   Para  2   Term      Preterm      AB      Living  2      SAB      IAB      Ectopic      Multiple      Live Births               Home Medications    Prior to Admission medications   Medication Sig Start Date End Date Taking? Authorizing Provider  Acetaminophen (TYLENOL ARTHRITIS PAIN PO) Take 2 tablets by mouth 2 (two) times daily as needed.    [provider]  amoxicillin (AMOXIL) 500 MG capsule Take 500 mg by mouth 3 (three) times daily. 01/30/21   [provider]  apixaban (ELIQUIS) 5 MG TABS tablet Take 1 tablet (5 mg total) by mouth 2 (two) times daily. 02/13/21   Hoyt Koch, MD  diltiazem (CARDIZEM CD) 240 MG 24 hr capsule Take 1 capsule (240 mg total) by mouth daily. 12/14/20   Chandrasekhar, Mahesh A, MD  diphenhydramine-acetaminophen (TYLENOL PM) 25-500 MG TABS tablet Take 1 tablet by mouth at bedtime. Sleep    [provider]  loratadine (CLARITIN) 10 MG tablet Take 10 mg by mouth at bedtime. Sinus drainage    [provider]  omeprazole (PRILOSEC) 20 MG capsule TAKE 1 CAPSULE BY MOUTH EVERY DAY 10/12/20   Hoyt Koch, MD  rosuvastatin (CRESTOR) 20 MG tablet Take 1 tablet (20 mg total) by mouth daily. 02/06/21   Werner Lean, MD  triamcinolone (NASACORT) 55 MCG/ACT AERO nasal inhaler Place 2 sprays into the nose daily as needed (allergies).    [provider]    Family History Family History  Problem Relation Age of Onset   Heart failure Mother    Osteoporosis Mother    Hyperlipidemia Mother    Hypertension Mother    Heart disease Father    Clotting disorder Father    Liver cancer Brother    Liver disease Maternal Aunt        x2   Colon cancer Neg Hx     Social History Social History   Tobacco Use   Smoking status: Never   Smokeless tobacco: Never  Vaping Use   Vaping Use: Never used  Substance Use Topics   Alcohol use: No   Drug use: Never     Allergies   Atenolol, Avelox [moxifloxacin hcl in nacl], Azithromycin, Doxycycline, Myrbetriq [mirabegron], and Oxybutynin   Review of Systems Review of Systems Per HPI  Physical Exam Triage Vital Signs ED Triage Vitals [03/23/21 1040]  Enc Vitals Group     BP (!) 179/79     Pulse Rate 75     Resp 18     Temp 98.6 F (37 C)     Temp Source Oral     SpO2 98 %     Weight      Height      Head  Circumference      Peak Flow      Pain Score 0     Pain Loc      Pain Edu?      Excl. in [place]?    No data found.  Updated Vital Signs BP (!) 179/79 (BP Location: Left Arm)   Pulse 75   Temp 98.6 F (37 C) (Oral)   Resp 18   LMP 07/23/2004   SpO2  98%   Visual Acuity Right Eye Distance:   Left Eye Distance:   Bilateral Distance:    Right Eye Near:   Left Eye Near:    Bilateral Near:     Physical Exam Constitutional:      General: She is not in acute distress.    Appearance: Normal appearance.  HENT:     Head: Normocephalic and atraumatic.     Right Ear: Tympanic membrane and ear canal normal.     Left Ear: Tympanic membrane and ear canal normal.     Nose: Congestion present.     Mouth/Throat:     Mouth: Mucous membranes are moist.     Pharynx: No posterior oropharyngeal erythema.  Eyes:     Extraocular Movements: Extraocular movements intact.     Conjunctiva/sclera: Conjunctivae normal.     Pupils: Pupils are equal, round, and reactive to light.  Cardiovascular:     Rate and Rhythm: Normal rate and regular rhythm.     Pulses: Normal pulses.     Heart sounds: Normal heart sounds.  Pulmonary:     Effort: Pulmonary effort is normal. No respiratory distress.     Breath sounds: No stridor. Wheezing present. No rhonchi.  Abdominal:     General: Abdomen is flat. Bowel sounds are normal.     Palpations: Abdomen is soft.  Musculoskeletal:        General: Normal range of motion.     Cervical back: Normal range of motion.  Skin:    General: Skin is warm and dry.  Neurological:     General: No focal deficit present.     Mental Status: She is alert and oriented to person, place, and time. Mental status is at baseline.  Psychiatric:        Mood and Affect: Mood normal.        Behavior: Behavior normal.     UC Treatments / Results  Labs (all labs ordered are listed, but only abnormal results are displayed) Labs Reviewed - No data to display  EKG   Radiology No  results found.  Procedures Procedures (including critical care time)  Medications Ordered in UC Medications - No data to display  Initial Impression / Assessment and Plan / UC Course  I have reviewed the triage vital signs and the nursing notes.  Pertinent labs & imaging results that were available during my care of the patient were reviewed by me and considered in my medical decision making (see chart for details).     Chest x-ray negative for any acute cardiopulmonary process.  Will treat cough and wheezing with prednisone steroid taper and albuterol inhaler.  Inhaler education provided.  Advised patient to go to the hospital if symptoms worsen or shortness of breath develops.  COVID-19 PCR pending at this time.  Patient is nontoxic-appearing and does not appear to be in need of immediate immediate medical attention at this time.  Discussed strict return precautions. Patient verbalized understanding and is agreeable with plan.  Final Clinical Impressions(s) / UC Diagnoses   Final diagnoses:  None   Discharge Instructions   None    ED Prescriptions   None    PDMP not reviewed this encounter.   Odis Luster, FNP 03/23/21 1217

## 2021-03-23 NOTE — ED Triage Notes (Signed)
Pt c/o coughing and wheezing onset last week. Denies headache, body aches, chills, N&V, diarrhea or constipation. States has had low grade fever at home.   Also states has had recent dental work done that required abx that she is currently taking.

## 2021-03-23 NOTE — Discharge Instructions (Addendum)
You are being prescribed prednisone steroid to help with inflammation in chest.  You have also been prescribed albuterol inhaler to take for wheezing and shortness of breath.  Go to the hospital if symptoms worsen.  COVID-19 test is pending.  We will call if it is positive.

## 2021-03-24 LAB — NOVEL CORONAVIRUS, NAA: SARS-CoV-2, NAA: NOT DETECTED

## 2021-03-24 LAB — SARS-COV-2, NAA 2 DAY TAT

## 2021-03-28 ENCOUNTER — Encounter: Payer: Self-pay | Admitting: Internal Medicine

## 2021-03-28 ENCOUNTER — Other Ambulatory Visit: Payer: Self-pay

## 2021-03-28 ENCOUNTER — Ambulatory Visit: Payer: Medicare Other | Admitting: Internal Medicine

## 2021-03-28 DIAGNOSIS — I872 Venous insufficiency (chronic) (peripheral): Secondary | ICD-10-CM

## 2021-03-28 DIAGNOSIS — J40 Bronchitis, not specified as acute or chronic: Secondary | ICD-10-CM

## 2021-03-28 MED ORDER — TRAZODONE HCL 50 MG PO TABS: 25.0000 mg | ORAL_TABLET | Freq: Every evening | ORAL | 3 refills | Status: DC | PRN

## 2021-03-28 MED ORDER — ALPRAZOLAM 0.25 MG PO TABS
0.2500 mg | ORAL_TABLET | Freq: Every day | ORAL | 0 refills | Status: DC | PRN
Start: 2021-03-28 — End: 2021-09-18

## 2021-03-28 NOTE — Progress Notes (Signed)
   Subjective:   Patient ID: Amy Williamson, female    DOB: 1951-02-23, 70 y.o.   MRN: [id number]  HPI The patient is a 70 YO female coming in for urgent care follow up (in for wheezing, given prednisone and albuterol) as well as hand/foot swelling for several months. She is feeling better with the wheezing and the prednisone helped her tooth as well.   Review of Systems  Constitutional: Negative.   HENT: Negative.    Eyes: Negative.   Respiratory:  Positive for wheezing. Negative for cough, chest tightness and shortness of breath.   Cardiovascular:  Positive for leg swelling. Negative for chest pain and palpitations.  Gastrointestinal:  Negative for abdominal distention, abdominal pain, constipation, diarrhea, nausea and vomiting.  Musculoskeletal: Negative.   Skin: Negative.   Neurological: Negative.   Psychiatric/Behavioral: Negative.     Objective:  Physical Exam Constitutional:      Appearance: She is well-developed.  HENT:     Head: Normocephalic and atraumatic.  Cardiovascular:     Rate and Rhythm: Normal rate and regular rhythm.  Pulmonary:     Effort: Pulmonary effort is normal. No respiratory distress.     Breath sounds: Normal breath sounds. No wheezing or rales.  Abdominal:     General: Bowel sounds are normal. There is no distension.     Palpations: Abdomen is soft.     Tenderness: There is no abdominal tenderness. There is no rebound.  Musculoskeletal:     Cervical back: Normal range of motion.     Comments: Trivial edema bilateral ankles  Skin:    General: Skin is warm and dry.  Neurological:     Mental Status: She is alert and oriented to person, place, and time.     Coordination: Coordination normal.    Vitals:   03/28/21 0933  BP: 138/80  Pulse: 70  Resp: 18  Temp: 98.6 F (37 C)  TempSrc: Oral  SpO2: 99%  Weight: 215 lb 6.4 oz (97.7 kg)  Height: 5' (1.524 m)    This visit occurred during the SARS-CoV-2 public health emergency.  Safety  protocols were in place, including screening questions prior to the visit, additional usage of staff PPE, and extensive cleaning of exam room while observing appropriate contact time as indicated for disinfecting solutions.   Assessment & Plan:

## 2021-03-28 NOTE — Patient Instructions (Signed)
The diltiazem could be causing the swelling.  We will try trazodone for night time for sleep and have refilled the xanax to use if needed.

## 2021-03-29 NOTE — Assessment & Plan Note (Signed)
Trivial edema on exam and pattern of morning low swelling which builds throughout the day is consistent with venous insufficiency. Asked her to cut back on sodium intake in foods. She has good water intake. Can use compression type stockings to help.

## 2021-03-29 NOTE — Assessment & Plan Note (Signed)
Recent episode which was evaluated at urgent care with negative covid-19 and CXR. She was given albuterol prn and prednisone. Is feeling better and no wheezing on exam today. Advised that prednisone can cause leg swelling (although this was present prior to this medication).

## 2021-03-31 ENCOUNTER — Ambulatory Visit: Payer: Medicare Other | Admitting: Cardiology

## 2021-03-31 ENCOUNTER — Encounter: Payer: Self-pay | Admitting: Cardiology

## 2021-03-31 ENCOUNTER — Other Ambulatory Visit: Payer: Self-pay

## 2021-03-31 ENCOUNTER — Telehealth: Payer: Self-pay | Admitting: *Deleted

## 2021-03-31 VITALS — BP 154/66 | HR 64 | Ht 60.0 in | Wt 216.8 lb

## 2021-03-31 DIAGNOSIS — I10 Essential (primary) hypertension: Secondary | ICD-10-CM | POA: Diagnosis not present

## 2021-03-31 DIAGNOSIS — G4733 Obstructive sleep apnea (adult) (pediatric): Secondary | ICD-10-CM | POA: Diagnosis not present

## 2021-03-31 NOTE — Progress Notes (Signed)
Date:  03/31/2021   ID:  Amy Williamson, DOB 10-09-1950, MRN [id number] The patient was identified using 2 identifiers. PCP:  Hoyt Koch, MD   [place], MD Sleep Medicine: Fransico Him, MD   Evaluation Performed:  Followup  Chief Complaint:  OSA  History of Present Illness:    Amy Williamson is a 70 y.o. female with a hx of GERD and HLD who was referred by Dr. Gasper Sells for evaluation of possible OSA due to PAF.  She does feel tired when she gets up but not bad and occasionally will take a nap during the day.  Her husband has told her that she does snore during the night.  Her Epworth sleepiness score is 7.  She was found to have moderate OSA with an AHI of 22.1/hr and drop in O2 sats to 70%.  CPAP titration was ordered but she wanted to discuss her results prior to proceeding at last OV.  She underwent CPAP titration to 10cm H2O and is now here for followup.    She is doing well with her CPAP device and thinks that she has gotten used to it.  She tolerates the mask and feels the pressure is adequate.  Since going on CPAP she still does not feel rested in the morning because she does not sleep well due to chronic hip pain and leg numbness.  She has noticed some issues with nasal congestion since starting the PAP.  She denies any significant mouth or nasal dryness.   She does not think that he snores.     Past Medical History:  Diagnosis Date   Anticoagulant long-term use    eliquis --- managed by cardiology   Arthritis    BACK AND SHOULDERS   Depressive disorder, not elsewhere classified    Essential hypertension    followed by pcp and cardiology   Frequency of urination    GERD (gastroesophageal reflux disease)    History of dysphagia 2015   s/p egd w/ dilation, pt did not have stricture   History of gastritis    OSA (obstructive sleep apnea)    followed by dr t. Telly Broberg---  study in epic 09-03-2020  moderate osa ,  pt scheduled for cpap titration 12-08-2020   PAF (paroxysmal atrial fibrillation) [facility])    cardiologist-- [name]---- new onset 06-30-2020 at pcp office visit,  s/p DCCV 07-28-2020 successful   Palpitations 06/2020   11-16-2020  per pt denies palpitations since DCCV 07-28-2020   Plantar fasciitis 08/2017   left foot    PMB (postmenopausal bleeding)    Pure hypercholesterolemia    Past Surgical History:  Procedure Laterality Date   BREAST LUMPECTOMY WITH RADIOACTIVE SEED LOCALIZATION Left 06/09/2020   Procedure: LEFT BREAST LUMPECTOMY WITH RADIOACTIVE SEED LOCALIZATION;  Surgeon: Coralie Keens, MD;  Location: [name];  Service: General;  Laterality: Left;  LMA   CARDIOVERSION N/A 07/28/2020   Procedure: CARDIOVERSION;  Surgeon: Werner Lean, MD;  Location: [place] [facility];  Service: Cardiovascular;  Laterality: N/A;   CARPAL TUNNEL RELEASE Bilateral right 2004;  left 2006   CATARACT EXTRACTION W/ INTRAOCULAR LENS  IMPLANT, BILATERAL  2016   CESAREAN SECTION  [id number]   BILATERAL TUBAL LIGATION W/ LAST C/S   COLONOSCOPY WITH PROPOFOL  last one 08/ 2013   [place] N/A 11/22/2020   Procedure: [place];  Surgeon: Megan Salon, MD;  Location:  [place];  Service: Gynecology;  Laterality: N/A;   EAR CYST EXCISION  08/03/2011   patient states this is incorrect   ESOPHAGOGASTRODUODENOSCOPY (EGD) WITH ESOPHAGEAL DILATION  08/2013   KNEE ARTHROSCOPY W/ MENISCAL REPAIR Left 08-03-2011  '@[facility]'$    and excision baker's cyst   PULLEY RELEASE RIGHT TRIGGER FINGER Right 01/08/2011   TRIGGER FINGER RELEASE Bilateral left 05/06/14;  right  02-29-2014     Current Meds  Medication Sig   Acetaminophen (TYLENOL ARTHRITIS PAIN PO) Take 2 tablets by mouth 2 (two) times daily as needed.   albuterol (VENTOLIN HFA) 108 (90 Base) MCG/ACT inhaler Inhale 1-2 puffs  into the lungs every 6 (six) hours as needed for wheezing or shortness of breath.   ALPRAZolam (XANAX) 0.25 MG tablet Take 1 tablet (0.25 mg total) by mouth daily as needed for anxiety.   apixaban (ELIQUIS) 5 MG TABS tablet Take 1 tablet (5 mg total) by mouth 2 (two) times daily.   diltiazem (CARDIZEM CD) 240 MG 24 hr capsule Take 1 capsule (240 mg total) by mouth daily.   diphenhydramine-acetaminophen (TYLENOL PM) 25-500 MG TABS tablet Take 1 tablet by mouth at bedtime. Sleep   loratadine (CLARITIN) 10 MG tablet Take 10 mg by mouth at bedtime. Sinus drainage   omeprazole (PRILOSEC) 20 MG capsule TAKE 1 CAPSULE BY MOUTH EVERY DAY   predniSONE (STERAPRED UNI-PAK 21 TAB) 10 MG (21) TBPK tablet Take by mouth daily. Take 6 tabs by mouth daily  for 2 days, then 5 tabs for 2 days, then 4 tabs for 2 days, then 3 tabs for 2 days, 2 tabs for 2 days, then 1 tab by mouth daily for 2 days   rosuvastatin (CRESTOR) 20 MG tablet Take 1 tablet (20 mg total) by mouth daily.   traZODone (DESYREL) 50 MG tablet Take 0.5-1 tablets (25-50 mg total) by mouth at bedtime as needed for sleep.   triamcinolone (NASACORT) 55 MCG/ACT AERO nasal inhaler Place 2 sprays into the nose daily as needed (allergies).     Allergies:   Atenolol, Avelox [moxifloxacin hcl in nacl], Azithromycin, Doxycycline, Myrbetriq [mirabegron], and Oxybutynin   Social History   Tobacco Use   Smoking status: Never   Smokeless tobacco: Never  Vaping Use   Vaping Use: Never used  Substance Use Topics   Alcohol use: No   Drug use: Never     Family Hx: The patient's family history includes Clotting disorder in her father; Heart disease in her father; Heart failure in her mother; Hyperlipidemia in her mother; Hypertension in her mother; Liver cancer in her brother; Liver disease in her maternal aunt; Osteoporosis in her mother. There is no history of Colon cancer.  ROS:   Please see the history of present illness.     All other systems  reviewed and are negative.   Prior CV studies:   The following studies were reviewed today:  CPAP titration and PAP download  Labs/Other Tests and Data Reviewed:    EKG:  No ECG reviewed.  Recent Labs: 06/30/2020: TSH 2.22 11/28/2020: ALT 20; BUN 21; Creatinine, Ser 0.85; Hemoglobin 12.9; Platelets 188; Potassium 4.2; Sodium 142   Recent Lipid Panel Lab Results  Component Value Date/Time   CHOL 171 01/31/2021 09:29 AM   TRIG 115 01/31/2021 09:29 AM   TRIG 54 06/25/2006 07:25 AM   HDL 63 01/31/2021 09:29 AM   CHOLHDL 2.7 01/31/2021 09:29 AM   CHOLHDL 3 06/30/2020 09:03 AM   LDLCALC 88 01/31/2021 09:29  AM   LDLDIRECT 142.3 03/16/2013 07:32 AM    Wt Readings from Last 3 Encounters:  03/31/21 216 lb 12.8 oz (98.3 kg)  03/28/21 215 lb 6.4 oz (97.7 kg)  01/31/21 212 lb (96.2 kg)     Risk Assessment/Calculations:      Objective:    Vital Signs:  BP (!) 154/66   Pulse 64   Ht 5' (1.524 m)   Wt 216 lb 12.8 oz (98.3 kg)   LMP 07/23/2004   SpO2 98%   BMI 42.34 kg/m   GEN: Well nourished, well developed in no acute distress HEENT: Normal NECK: No JVD; No carotid bruits LYMPHATICS: No lymphadenopathy CARDIAC:RRR, no murmurs, rubs, gallops RESPIRATORY:  Clear to auscultation without rales, wheezing or rhonchi  ABDOMEN: Soft, non-tender, non-distended MUSCULOSKELETAL:  No edema; No deformity  SKIN: Warm and dry NEUROLOGIC:  Alert and oriented x 3 PSYCHIATRIC:  Normal affect    ASSESSMENT & PLAN:    1.  OSA - The patient is tolerating PAP therapy well without any problems. The PAP download performed by his DME was personally reviewed and interpreted by me today and showed an AHI of 3/hr on 10 cm H2O with 100% compliance in using more than 4 hours nightly.  The patient has been using and benefiting from PAP use and will continue to benefit from therapy.    2.  HTN -BP is borderline controlled on exam today but likely due to course of prednisone she is on for treatment of  bronchitis because normally her BP is controlled.  -Continue prescription drug management with Cardizem CD '240mg'$  (changed from amlodipine due to Palpitations) with PRN refills   Medication Adjustments/Labs and Tests Ordered: Current medicines are reviewed at length with the patient today.  Concerns regarding medicines are outlined above.   Tests Ordered: No orders of the defined types were placed in this encounter.   Medication Changes: No orders of the defined types were placed in this encounter.   Follow Up:  Virtual Visit  after her CPAP titration  Signed, Fransico Him, MD  03/31/2021 10:19 AM    [place]

## 2021-03-31 NOTE — Telephone Encounter (Signed)
Patient was in the office today for an appointment with Dr. Radford Pax. The patient is asking for samples of Eliquis 5 mg for the months of Oct., Nov., and Dec. She stated to pay out of pocket would cost over $600.00, she has a "coverage gap with insurance. She will be able to get medication in Jan. 2023. Please advise the patient if she will be able to get the samples for the months stated. She can be reached at [phone]. Thank you.

## 2021-03-31 NOTE — Patient Instructions (Signed)
Medication Instructions:  Your physician recommends that you continue on your current medications as directed. Please refer to the Current Medication list given to you today.  *If you need a refill on your cardiac medications before your next appointment, please call your pharmacy*  Follow-Up: At [facility], you and your health needs are our priority.  As part of our continuing mission to provide you with exceptional heart care, we have created designated Provider Care Teams.  These Care Teams include your primary Cardiologist (physician) and Advanced Practice Providers (APPs -  Physician Assistants and Nurse Practitioners) who all work together to provide you with the care you need, when you need it.  We recommend signing up for the patient portal called "MyChart".  Sign up information is provided on this After Visit Summary.  MyChart is used to connect with patients for Virtual Visits (Telemedicine).  Patients are able to view lab/test results, encounter notes, upcoming appointments, etc.  Non-urgent messages can be sent to your provider as well.   To learn more about what you can do with MyChart, go to [email or web address].    Your next appointment:   1 year(s)  The format for your next appointment:   In Person  Provider:   Traci Turner, MD   

## 2021-04-01 ENCOUNTER — Telehealth: Payer: Self-pay | Admitting: *Deleted

## 2021-04-01 DIAGNOSIS — G4733 Obstructive sleep apnea (adult) (pediatric): Secondary | ICD-10-CM

## 2021-04-01 NOTE — Telephone Encounter (Signed)
-----   Message from Nuala Alpha, LPN sent at [phone] 10:54 AM EDT ----- Regarding: out of cpap supplies Dr. Radford Pax saw this pt for sleep today. Pt mentioned that she is almost completely out of her CPAP supplies/cannula.  Can you please assist her with getting these supplies.    Thanks, [employer]

## 2021-04-01 NOTE — Telephone Encounter (Signed)
Order placed to [employer] via community message. 

## 2021-04-03 NOTE — Telephone Encounter (Signed)
I called pt to give her Pt Asst information. She stated that she is currently paying $141/mth for the medication. She will call [employer] and have them fax her the form to apply.

## 2021-04-03 NOTE — Telephone Encounter (Signed)
Cannot sample pt for 3 months, Jeani Hawking are you able to reach out to pt to discuss pt assistance? Looks like she was requesting samples starting for the month of October.

## 2021-04-13 DIAGNOSIS — R062 Wheezing: Secondary | ICD-10-CM

## 2021-04-13 DIAGNOSIS — R059 Cough, unspecified: Secondary | ICD-10-CM

## 2021-04-14 NOTE — Telephone Encounter (Signed)
Sueanne Margarita, MD     Please refer to Pulmonary

## 2021-04-19 ENCOUNTER — Other Ambulatory Visit: Payer: Self-pay | Admitting: Internal Medicine

## 2021-04-20 ENCOUNTER — Encounter: Payer: Self-pay | Admitting: Internal Medicine

## 2021-04-20 ENCOUNTER — Other Ambulatory Visit: Payer: Self-pay

## 2021-04-20 ENCOUNTER — Ambulatory Visit: Payer: Medicare Other | Admitting: Internal Medicine

## 2021-04-20 VITALS — BP 154/74 | HR 78 | Temp 98.5°F | Ht 60.0 in | Wt 212.6 lb

## 2021-04-20 DIAGNOSIS — G4733 Obstructive sleep apnea (adult) (pediatric): Secondary | ICD-10-CM | POA: Diagnosis not present

## 2021-04-20 DIAGNOSIS — R058 Other specified cough: Secondary | ICD-10-CM

## 2021-04-20 MED ORDER — BUDESONIDE-FORMOTEROL FUMARATE 80-4.5 MCG/ACT IN AERO: INHALATION_SPRAY | RESPIRATORY_TRACT | 12 refills | Status: DC

## 2021-04-20 MED ORDER — BREZTRI AEROSPHERE 160-9-4.8 MCG/ACT IN AERO: 2.0000 | INHALATION_SPRAY | Freq: Two times a day (BID) | RESPIRATORY_TRACT | 0 refills | Status: DC

## 2021-04-20 MED ORDER — PANTOPRAZOLE SODIUM 40 MG PO TBEC: 40.0000 mg | DELAYED_RELEASE_TABLET | Freq: Every day | ORAL | 2 refills | Status: DC

## 2021-04-20 MED ORDER — PREDNISONE 10 MG PO TABS: ORAL_TABLET | ORAL | 0 refills | Status: DC

## 2021-04-20 MED ORDER — AMOXICILLIN-POT CLAVULANATE 875-125 MG PO TABS: 1.0000 | ORAL_TABLET | Freq: Two times a day (BID) | ORAL | 0 refills | Status: AC

## 2021-04-20 NOTE — Assessment & Plan Note (Signed)
CPAP per nasl pillows per Turner June 2022   Reviewed case with Dr Annamaria Boots with limited options as pt needs to cpap for her heart and can't tol any mask x for nasal pillows per pt   ? Try lower pressure  ? ent eval or dental evel for alternatives to cpap   >>> for now no changes rec on the cpap.           Each maintenance medication was reviewed in detail including emphasizing most importantly the difference between maintenance and prns and under what circumstances the prns are to be triggered using an action plan format where appropriate.  Total time for H and P, chart review, counseling, reviewing hfa  device(s) and generating customized AVS unique to this office visit / same day charting = 60 min

## 2021-04-20 NOTE — Progress Notes (Signed)
Amy Williamson, female    DOB: 08-28-1950,    MRN: [id number]   Brief patient profile:  73 yowf never smoker referred to pulmonary clinic 04/20/2021 by Dr Fransico Him  for refractory cough/wheeze that pt dates back  to w/in a week or two of new start for OSA with cpap/ nasal pillows (can't tol full face mask)   Onset on her 46's sev times a year bad cough > dx bronchitis saw Lenna Gilford  rx steroid shot/ abx 100% better sev times a year no def season and in 2015  (at wt 151)assoc with dysphagia better on PPI one hour ac daily and fine for the last year(since fall 2021) until sev weeks p started the CPAP   June 2022 and since then rx  amox/ prednisone which helped a lot Sept 2  2022 x 12 days but then w/in a week same problem.   History of Present Illness  04/20/2021  Pulmonary/ 1st office eval/Alven Alverio  Chief Complaint  Patient presents with   Pulmonary Consult    Referred by Fransico Him, MD. Pt c/o cough and wheezing x 6 wks- cough is prod with yellow sputum.  She states symptoms worse with exertion.   Dyspnea:  can no longer walk [employer] due to sob/ wheeze  Cough: ok while on cpap then starts up cough/ wheeze  in the am > mucus yellow again  Sleep: does fine on cpap thru the night lying flat/ nasal pillows  SABA use: last used 04/20/21 and not on day of ov Recent tooth infection L Lower molars  and persistent daytime nasal congestion despite abx   No obvious other patterns in day to day or daytime variability or assoc   mucus plugs or hemoptysis or cp or chest tightness or hb symptoms.   Also denies any obvious fluctuation of symptoms with weather or environmental changes or other aggravating or alleviating factors except as outlined above   No unusual exposure hx or h/o childhood pna/ asthma or knowledge of premature birth.  Current Allergies, Complete Past Medical History, Past Surgical History, Family History, and Social History were reviewed in [employer]  record.  ROS  The following are not active complaints unless bolded Hoarseness, sore throat, dysphagia, dental problems, itching, sneezing,  nasal congestion or discharge of excess mucus or purulent secretions, ear ache,   fever, chills, sweats, unintended wt loss or wt gain, classically pleuritic or exertional cp,  orthopnea pnd or arm/hand swelling  or leg swelling, presyncope, palpitations, abdominal pain, anorexia, nausea, vomiting, diarrhea  or change in bowel habits or change in bladder habits, change in stools or change in urine, dysuria, hematuria,  rash, arthralgias, visual complaints, headache, numbness, weakness or ataxia or problems with walking or coordination,  change in mood or  memory.             Past Medical History:  Diagnosis Date   Anticoagulant long-term use    eliquis --- managed by cardiology   Arthritis    BACK AND SHOULDERS   Depressive disorder, not elsewhere classified    Essential hypertension    followed by pcp and cardiology   Frequency of urination    GERD (gastroesophageal reflux disease)    History of dysphagia 2015   s/p egd w/ dilation, pt did not have stricture   History of gastritis    OSA (obstructive sleep apnea)    followed by dr t. turner---  study in epic 09-03-2020 moderate osa ,  pt  scheduled for cpap titration 12-08-2020   PAF (paroxysmal atrial fibrillation) [facility])    cardiologist-- [name]---- new onset 06-30-2020 at pcp office visit,  s/p DCCV 07-28-2020 successful   Palpitations 06/2020   11-16-2020  per pt denies palpitations since DCCV 07-28-2020   Plantar fasciitis 08/2017   left foot    PMB (postmenopausal bleeding)    Pure hypercholesterolemia     Outpatient Medications Prior to Visit  Medication Sig Dispense Refill   Acetaminophen (TYLENOL ARTHRITIS PAIN PO) Take 2 tablets by mouth 2 (two) times daily as needed.     albuterol (VENTOLIN HFA) 108 (90 Base) MCG/ACT inhaler Inhale 1-2 puffs into the lungs every 6  (six) hours as needed for wheezing or shortness of breath. 1 each 0   ALPRAZolam (XANAX) 0.25 MG tablet Take 1 tablet (0.25 mg total) by mouth daily as needed for anxiety. 20 tablet 0   apixaban (ELIQUIS) 5 MG TABS tablet Take 1 tablet (5 mg total) by mouth 2 (two) times daily. 180 tablet 3   diltiazem (CARDIZEM CD) 240 MG 24 hr capsule Take 1 capsule (240 mg total) by mouth daily. 90 capsule 3   diphenhydramine-acetaminophen (TYLENOL PM) 25-500 MG TABS tablet Take 1 tablet by mouth at bedtime. Sleep     loratadine (CLARITIN) 10 MG tablet Take 10 mg by mouth at bedtime. Sinus drainage     omeprazole (PRILOSEC) 20 MG capsule TAKE 1 CAPSULE BY MOUTH EVERY DAY 90 capsule 3   rosuvastatin (CRESTOR) 20 MG tablet Take 1 tablet (20 mg total) by mouth daily. 90 tablet 3   traZODone (DESYREL) 50 MG tablet TAKE 0.5-1 TABLETS BY MOUTH AT BEDTIME AS NEEDED FOR SLEEP. 90 tablet 2   triamcinolone (NASACORT) 55 MCG/ACT AERO nasal inhaler Place 2 sprays into the nose daily as needed (allergies).         0       Objective:     BP (!) 154/74 (BP Location: Left Arm, Cuff Size: Normal)   Pulse 78   Temp 98.5 F (36.9 C) (Oral)   Ht 5' (1.524 m)   Wt 212 lb 9.6 oz (96.4 kg)   LMP 07/23/2004   SpO2 98% Comment: on RA  BMI 41.52 kg/m   SpO2: 98 % (on RA)   Vital signs reviewed  04/20/2021  - Note at rest 02 sats  98% on RA   General appearance:    pleasant amb obese wf slt nasal tone to voice    HEENT : pt wearing mask not removed for exam due to covid -19 concerns.    NECK :  without JVD/Nodes/TM/ nl carotid upstrokes bilaterally   LUNGS: no acc muscle use,  Nl contour chest insp./exp wheeze  bilaterally without cough on insp or exp maneuvers   CV:  RRR  no s3 or murmur or increase in P2, and no edema   ABD:  soft and nontender with nl inspiratory excursion in the supine position. No bruits or organomegaly appreciated, bowel sounds nl  MS:  Nl gait/ ext warm without deformities, calf  tenderness, cyanosis or clubbing No obvious joint restrictions   SKIN: warm and dry without lesions    NEURO:  alert, approp, nl sensorium with  no motor or cerebellar deficits apparent.     I personally reviewed images and agree with radiology impression as follows:  CXR:   Pa and lateral      Assessment   Upper airway cough syndrome vs cough variant asthma Onset  on her 26s worse p started CPAP 12/2020 with nasal pillows  -  Sinus ct 04/20/2021 >>>   DDX of  difficult airways management almost all start with A and  include Adherence, Ace Inhibitors, Acid Reflux, Active Sinus Disease, Alpha 1 Antitripsin deficiency, Anxiety masquerading as Airways dz,  ABPA,  Allergy(esp in young), Aspiration (esp in elderly), Adverse effects of meds,  Active smoking or vaping, A bunch of PE's (a small clot burden can't cause this syndrome unless there is already severe underlying pulm or vascular dz with poor reserve) plus two Bs  = Bronchiectasis and Beta blocker use..and one C= CHF  Adherence is always the initial "prime suspect" and is a multilayered concern that requires a "trust but verify" approach in every patient - starting with knowing how to use medications, especially inhalers, correctly, keeping up with refills and understanding the fundamental difference between maintenance and prns vs those medications only taken for a very short course and then stopped and not refilled.  - - The proper method of use, as well as anticipated side effects, of a metered-dose inhaler were discussed and demonstrated to the patient using teach back method.    ? Acid (or non-acid) GERD > always difficult to exclude as up to 75% of pts in some series report no assoc GI/ Heartburn symptoms> rec max (24h)  acid suppression and diet restrictions/ reviewed and instructions given in writing.   ? Active sinus dz > augmentin x 10 days then ct sinus or ent eval, whichever insurance will approve  ? Allergy/asthma  > try symb  80 2bid  (breztri on bid sample to use to teach mdi) plus Prednisone 10 mg take  4 each am x 2 days,   2 each am x 2 days,  1 each am x 2 days and stop  >>> consider adding singulair/ doing allergy screen on return.   ? A bunch of PEs >very  unlikely on eliquis   ? chf >  Has diastolic dysfunction and mild MR per last echo 08/10/20      OSA (obstructive sleep apnea) CPAP per nasl pillows per Turner June 2022   Reviewed case with Dr Annamaria Boots with limited options as pt needs to cpap for her heart and can't tol any mask x for nasal pillows per pt   ? Try lower pressure  ? ent eval or dental evel for alternatives to cpap   >>> for now no changes rec on the cpap.        Morbid obesity ([place]) Body mass index is 41.52 kg/m.  -  Note wt 151 when last seen in pulmonary clinic by Lenna Gilford now 212  Lab Results  Component Value Date   TSH 2.22 06/30/2020      Contributing to doe and risk of GERD  >>>   reviewed the need and the process to achieve and maintain neg calorie balance > defer f/u primary care including intermittently monitoring thyroid status           Each maintenance medication was reviewed in detail including emphasizing most importantly the difference between maintenance and prns and under what circumstances the prns are to be triggered using an action plan format where appropriate.  Total time for H and P, chart review, counseling, reviewing hfa device(s) and generating customized AVS unique to this office visit / same day charting > 60 min            Christinia Gully, MD 04/20/2021

## 2021-04-20 NOTE — Patient Instructions (Addendum)
Plan A = Automatic = Always=    Breztri 1 puff 1st thing in am and then 12 hour later   Work on inhaler technique:  relax and gently blow all the way out then take a nice smooth full deep breath back in, triggering the inhaler at same time you start breathing in.  Hold for up to 5 seconds if you can. Blow breztri out thru nose. Rinse and gargle with water when done.  If mouth or throat bother you at all,  try brushing teeth/gums/tongue with arm and hammer toothpaste/ make a slurry and gargle and spit out.   Once you have used up the breztri or have mastered the technique change to symbicort 80 Take 2 puffs first thing in am and then another 2 puffs about 12 hours later.      Plan B = Backup (to supplement plan A, not to replace it) Only use your albuterol inhaler as a rescue medication to be used if you can't catch your breath by resting or doing a relaxed purse lip breathing pattern.  - The less you use it, the better it will work when you need it. - Ok to use the inhaler up to 2 puffs  every 4 hours if you must but call for appointment if use goes up over your usual need - Don't leave home without it !!  (think of it like the spare tire for your car)     Prednisone 10 mg take  4 each am x 2 days,   2 each am x 2 days,  1 each am x 2 days and stop   Pantoprazole (protonix) 40 mg (omeprazole 20 x 2)    Take  30-60 min before first meal of the day and Pepcid (famotidine)  20 mg after supper until return to office - this is the best way to tell whether stomach acid is contributing to your problem.      GERD (REFLUX)  is an extremely common cause of respiratory symptoms just like yours , many times with no obvious heartburn at all.    It can be treated with medication, but also with lifestyle changes including elevation of the head of your bed (ideally with 6 -8inch blocks under the headboard of your bed),  Smoking cessation, avoidance of late meals, excessive alcohol, and avoid fatty foods,  chocolate, peppermint, colas, red wine, and acidic juices such as orange juice.  NO MINT OR MENTHOL PRODUCTS SO NO COUGH DROPS  USE SUGARLESS CANDY INSTEAD (Jolley ranchers or Stover's or Life Savers) or even ice chips will also do - the key is to swallow to prevent all throat clearing. NO OIL BASED VITAMINS - use powdered substitutes.  Avoid fish oil when coughing.    Augmentin 875 mg take one pill twice daily  X 10 days - take at breakfast and supper with large glass of water.  It would help reduce the usual side effects (diarrhea and yeast infections) if you ate cultured yogurt at lunch.   We will call you to schedule sinus ct   Please schedule a follow up office visit in 4 weeks, sooner if needed

## 2021-04-20 NOTE — Assessment & Plan Note (Addendum)
Onset on her 88s worse p started CPAP 12/2020 with nasal pillows  -  Sinus ct 04/20/2021 >>>   DDX of  difficult airways management almost all start with A and  include Adherence, Ace Inhibitors, Acid Reflux, Active Sinus Disease, Alpha 1 Antitripsin deficiency, Anxiety masquerading as Airways dz,  ABPA,  Allergy(esp in young), Aspiration (esp in elderly), Adverse effects of meds,  Active smoking or vaping, A bunch of PE's (a small clot burden can't cause this syndrome unless there is already severe underlying pulm or vascular dz with poor reserve) plus two Bs  = Bronchiectasis and Beta blocker use..and one C= CHF  Adherence is always the initial "prime suspect" and is a multilayered concern that requires a "trust but verify" approach in every patient - starting with knowing how to use medications, especially inhalers, correctly, keeping up with refills and understanding the fundamental difference between maintenance and prns vs those medications only taken for a very short course and then stopped and not refilled.  - - The proper method of use, as well as anticipated side effects, of a metered-dose inhaler were discussed and demonstrated to the patient using teach back method.    ? Acid (or non-acid) GERD > always difficult to exclude as up to 75% of pts in some series report no assoc GI/ Heartburn symptoms> rec max (24h)  acid suppression and diet restrictions/ reviewed and instructions given in writing.   ? Active sinus dz > augmentin x 10 days then ct sinus or ent eval, whichever insurance will approve  ? Allergy/asthma  > try symb 80 2bid  (breztri on bid sample to use to teach mdi) plus Prednisone 10 mg take  4 each am x 2 days,   2 each am x 2 days,  1 each am x 2 days and stop  >>> consider adding singulair/ doing allergy screen on return.   ? A bunch of PEs >very  unlikely on eliquis   ? chf >  Has diastolic dysfunction and mild MR per last echo 08/10/20

## 2021-04-20 NOTE — Assessment & Plan Note (Signed)
Body mass index is 41.52 kg/m.  -  Note wt 151 when last seen in pulmonary clinic by Lenna Gilford now 212  Lab Results  Component Value Date   TSH 2.22 06/30/2020      Contributing to doe and risk of GERD >>>   reviewed the need and the process to achieve and maintain neg calorie balance > defer f/u primary care including intermittently monitoring thyroid status           Each maintenance medication was reviewed in detail including emphasizing most importantly the difference between maintenance and prns and under what circumstances the prns are to be triggered using an action plan format where appropriate.  Total time for H and P, chart review, counseling, reviewing hfa device(s) and generating customized AVS unique to this office visit / same day charting > 60 min

## 2021-04-21 ENCOUNTER — Telehealth: Payer: Self-pay

## 2021-04-21 DIAGNOSIS — G4733 Obstructive sleep apnea (adult) (pediatric): Secondary | ICD-10-CM

## 2021-04-21 NOTE — Telephone Encounter (Signed)
-----   Message from Sueanne Margarita, MD sent at 04/21/2021  9:41 AM EDT ----- Patient not tolerating CPAP masks - not candidate for Inspire device due to obesity - please refer to Dr. Toy Cookey for consideration of oral device ----- Message ----- From: Tanda Rockers, MD Sent: 04/20/2021   3:39 PM EDT To: Sueanne Margarita, MD, Hoyt Koch, MD

## 2021-04-24 ENCOUNTER — Telehealth: Payer: Self-pay | Admitting: Cardiology

## 2021-04-24 NOTE — Telephone Encounter (Signed)
Spoke with [name] and have faxed over requested information.

## 2021-04-24 NOTE — Telephone Encounter (Signed)
Follow Up:     Amy Williamson would like for you to call her, she says she needs some more information.please.

## 2021-05-02 ENCOUNTER — Telehealth: Payer: Self-pay | Admitting: Internal Medicine

## 2021-05-02 NOTE — Telephone Encounter (Signed)
MW please advise if you would like to use the CT scan that was done today and cancel the CT scheduled for Saturday.  Thanks

## 2021-05-02 NOTE — Telephone Encounter (Signed)
I have called and LM on VM for that office to call back.  Need to make sure the CT scan was done and that they can send this over to MW before we cancel the one for Saturday.

## 2021-05-02 NOTE — Telephone Encounter (Signed)
I don't see a ct sinus under imaging nor under care everywhere so be sure it's been done before canceling mine

## 2021-05-03 NOTE — Telephone Encounter (Signed)
I have called and LM on VM for the office of Dr. FUller to call us back.

## 2021-05-03 NOTE — Telephone Encounter (Signed)
Dr Betsey Holiday office returning call on this pt [phone].Amy Williamson'

## 2021-05-04 NOTE — Telephone Encounter (Signed)
Spoke with [name] at Dr [facility] office  She states they will do the CT and fax results to [place]  Nothing further needed

## 2021-05-06 ENCOUNTER — Inpatient Hospital Stay: Admission: RE | Admit: 2021-05-06 | Payer: Medicare Other | Source: Ambulatory Visit

## 2021-05-09 ENCOUNTER — Other Ambulatory Visit: Payer: Self-pay

## 2021-05-09 ENCOUNTER — Other Ambulatory Visit: Payer: Medicare Other | Admitting: *Deleted

## 2021-05-09 DIAGNOSIS — E782 Mixed hyperlipidemia: Secondary | ICD-10-CM

## 2021-05-09 DIAGNOSIS — I700 Atherosclerosis of aorta: Secondary | ICD-10-CM

## 2021-05-09 LAB — LIPID PANEL
Chol/HDL Ratio: 2.5 ratio (ref 0.0–4.4)
Cholesterol, Total: 155 mg/dL (ref 100–199)
HDL: 62 mg/dL (ref >39)
LDL Chol Calc (NIH): 72 mg/dL (ref 0–99)
Triglycerides: 118 mg/dL (ref 0–149)
VLDL Cholesterol Cal: 21 mg/dL (ref 5–40)

## 2021-05-09 LAB — HEPATIC FUNCTION PANEL
ALT: 14 IU/L (ref 0–32)
AST: 14 IU/L (ref 0–40)
Albumin: 4.3 g/dL (ref 3.7–4.7)
Alkaline Phosphatase: 70 IU/L (ref 44–121)
Bilirubin Total: 0.4 mg/dL (ref 0.0–1.2)
Bilirubin, Direct: 0.14 mg/dL (ref 0.00–0.40)
Total Protein: 5.9 g/dL — ABNORMAL LOW (ref 6.0–8.5)

## 2021-05-15 ENCOUNTER — Encounter (HOSPITAL_BASED_OUTPATIENT_CLINIC_OR_DEPARTMENT_OTHER): Payer: Self-pay | Admitting: Obstetrics & Gynecology

## 2021-05-15 ENCOUNTER — Ambulatory Visit (INDEPENDENT_AMBULATORY_CARE_PROVIDER_SITE_OTHER): Payer: Medicare Other | Admitting: Obstetrics & Gynecology

## 2021-05-15 ENCOUNTER — Other Ambulatory Visit: Payer: Self-pay

## 2021-05-15 VITALS — BP 125/67 | HR 71 | Ht 60.0 in | Wt 216.4 lb

## 2021-05-15 DIAGNOSIS — E2839 Other primary ovarian failure: Secondary | ICD-10-CM

## 2021-05-15 DIAGNOSIS — Z01419 Encounter for gynecological examination (general) (routine) without abnormal findings: Secondary | ICD-10-CM

## 2021-05-15 DIAGNOSIS — Z780 Asymptomatic menopausal state: Secondary | ICD-10-CM

## 2021-05-15 DIAGNOSIS — N3281 Overactive bladder: Secondary | ICD-10-CM

## 2021-05-15 DIAGNOSIS — D242 Benign neoplasm of left breast: Secondary | ICD-10-CM | POA: Diagnosis not present

## 2021-05-15 DIAGNOSIS — Z1231 Encounter for screening mammogram for malignant neoplasm of breast: Secondary | ICD-10-CM | POA: Diagnosis not present

## 2021-05-15 NOTE — Progress Notes (Signed)
70 y.o. G2P2 Married White or Caucasian female here for breast and pelvic exam.  Had PMP bleeding earlier this year.  Had polyp removed.  Had some spotting for about a week afterwards.  Has had nothing since that time.  Denies cramping.    Diagnosed with sleep apnea.  Started CPAP this year.  Is having a lot of issues with upper respiratory issues since starting using it.  In fact, she's just stopped as it has been so frustrating to her.  She is having a mouth guard made for helping with sleep apnea.  Has been on prednisone.  This helped but the symptoms returned as soon as she stopped them.  She has seen Dr. Melvyn Novas, pulmonology.  She was referred by Dr. Radford Pax.  She had a root canal earlier this year.  The tooth ended up infected and she was on Augmentin for the infection.  This did not help.    Had renewed issues with a fib but diltiazem has fixed this at this time.    Has a place in her breast that she would like me to examine today.  PCP:  Dr. Sharlet Salina.  Patient's last menstrual period was 07/23/2004.          Sexually active: No.  H/O STD:  no  Health Maintenance: PCP:  Dr. Sharlet Salina.  Last wellness appt was 06/2020.  Did blood work at that appt:  yes Vaccines are up to date:  has not done but one Covid booster Colonoscopy:  03/12/2012 MMG:  04/13/2020 Additional images needed BMD:  12/08/2015 Last pap smear:  11/07/2020 Negative.   H/o abnormal pap smear:  no   reports that she has never smoked. She has never used smokeless tobacco. She reports that she does not drink alcohol and does not use drugs.  Past Medical History:  Diagnosis Date   Anticoagulant long-term use    eliquis --- managed by cardiology   Arthritis    BACK AND SHOULDERS   Depressive disorder, not elsewhere classified    Essential hypertension    followed by pcp and cardiology   Frequency of urination    GERD (gastroesophageal reflux disease)    History of dysphagia 2015   s/p egd w/ dilation, pt did not have  stricture   History of gastritis    OSA (obstructive sleep apnea)    followed by dr t. turner---  study in epic 09-03-2020 moderate osa ,  pt scheduled for cpap titration 12-08-2020   PAF (paroxysmal atrial fibrillation) [facility])    cardiologist-- [name]---- new onset 06-30-2020 at pcp office visit,  s/p DCCV 07-28-2020 successful   Palpitations 06/2020   11-16-2020  per pt denies palpitations since DCCV 07-28-2020   Plantar fasciitis 08/2017   left foot    Pure hypercholesterolemia     Past Surgical History:  Procedure Laterality Date   BREAST LUMPECTOMY WITH RADIOACTIVE SEED LOCALIZATION Left 06/09/2020   Procedure: LEFT BREAST LUMPECTOMY WITH RADIOACTIVE SEED LOCALIZATION;  Surgeon: Coralie Keens, MD;  Location: [place];  Service: General;  Laterality: Left;  LMA   CARDIOVERSION N/A 07/28/2020   Procedure: CARDIOVERSION;  Surgeon: Werner Lean, MD;  Location: [place];  Service: Cardiovascular;  Laterality: N/A;   CARPAL TUNNEL RELEASE Bilateral right 2004;  left 2006   CATARACT EXTRACTION W/ INTRAOCULAR LENS  IMPLANT, BILATERAL  2016   CESAREAN SECTION  [id number]   BILATERAL TUBAL LIGATION W/ LAST C/S   COLONOSCOPY WITH PROPOFOL  last one 08/  2013   DILATATION & CURETTAGE/HYSTEROSCOPY WITH MYOSURE N/A 11/22/2020   Procedure: DILATATION & CURETTAGE/HYSTEROSCOPY WITH MYOSURE;  Surgeon: Megan Salon, MD;  Location: [facility];  Service: Gynecology;  Laterality: N/A;   EAR CYST EXCISION  08/03/2011   patient states this is incorrect   ESOPHAGOGASTRODUODENOSCOPY (EGD) WITH ESOPHAGEAL DILATION  08/2013   KNEE ARTHROSCOPY W/ MENISCAL REPAIR Left 08-03-2011  @[facility]    and excision baker's cyst   PULLEY RELEASE RIGHT TRIGGER FINGER Right 01/08/2011   TRIGGER FINGER RELEASE Bilateral left 05/06/14;  right  02-29-2014    Current Outpatient Medications  Medication Sig Dispense Refill   Acetaminophen (TYLENOL ARTHRITIS PAIN PO)  Take 2 tablets by mouth 2 (two) times daily as needed.     albuterol (VENTOLIN HFA) 108 (90 Base) MCG/ACT inhaler Inhale 1-2 puffs into the lungs every 6 (six) hours as needed for wheezing or shortness of breath. 1 each 0   ALPRAZolam (XANAX) 0.25 MG tablet Take 1 tablet (0.25 mg total) by mouth daily as needed for anxiety. 20 tablet 0   apixaban (ELIQUIS) 5 MG TABS tablet Take 1 tablet (5 mg total) by mouth 2 (two) times daily. 180 tablet 3   budesonide-formoterol (SYMBICORT) 80-4.5 MCG/ACT inhaler Take 2 puffs first thing in am and then another 2 puffs about 12 hours later. 1 each 12   diltiazem (CARDIZEM CD) 240 MG 24 hr capsule Take 1 capsule (240 mg total) by mouth daily. 90 capsule 3   loratadine (CLARITIN) 10 MG tablet Take 10 mg by mouth at bedtime. Sinus drainage     pantoprazole (PROTONIX) 40 MG tablet Take 1 tablet (40 mg total) by mouth daily. Take 30-60 min before first meal of the day 30 tablet 2   rosuvastatin (CRESTOR) 20 MG tablet Take 1 tablet (20 mg total) by mouth daily. 90 tablet 3   traZODone (DESYREL) 50 MG tablet TAKE 0.5-1 TABLETS BY MOUTH AT BEDTIME AS NEEDED FOR SLEEP. 90 tablet 2   triamcinolone (NASACORT) 55 MCG/ACT AERO nasal inhaler Place 2 sprays into the nose daily as needed (allergies).     diphenhydramine-acetaminophen (TYLENOL PM) 25-500 MG TABS tablet Take 1 tablet by mouth at bedtime. Sleep (Patient not taking: Reported on 05/15/2021)     No current facility-administered medications for this visit.    Family History  Problem Relation Age of Onset   Heart failure Mother    Osteoporosis Mother    Hyperlipidemia Mother    Hypertension Mother    Heart disease Father    Clotting disorder Father    Liver cancer Brother    Liver disease Maternal Aunt        x2   Colon cancer Neg Hx     Review of Systems  Constitutional: Negative.   Gastrointestinal: Negative.   Genitourinary: Negative.    Exam:   BP 125/67 (BP Location: Left Arm, Patient Position:  Sitting, Cuff Size: Large)   Pulse 71   Ht 5' (1.524 m)   Wt 216 lb 6.4 oz (98.2 kg)   LMP 07/23/2004   BMI 42.26 kg/m   Height: 5' (152.4 cm)  General appearance: alert, cooperative and appears stated age Breasts: normal appearance, no masses or tenderness Abdomen: soft, non-tender; bowel sounds normal; no masses,  no organomegaly Lymph nodes: Cervical, supraclavicular, and axillary nodes normal.  No abnormal inguinal nodes palpated Neurologic: Grossly normal  Pelvic: External genitalia:  no lesions  Urethra:  normal appearing urethra with no masses, tenderness or lesions              Bartholins and Skenes: normal                 Vagina: normal appearing vagina with atrophic changes and no discharge, no lesions              Cervix: no lesions              Pap taken: No. Bimanual Exam:  Uterus:  normal size, contour, position, consistency, mobility, non-tender              Adnexa: normal adnexa and no mass, fullness, tenderness               Rectovaginal: Confirms               Anus:  normal sphincter tone, no lesions  Chaperone, Octaviano Batty, CMA, was present for exam.  Assessment/Plan: 1. Encntr for gyn exam (general) (routine) w/o abn findings - pap neg 10/2020 - order for MMG placed - order for BMD placed - colonoscopy done 2013 - lab work done with Dr. Sharlet Salina - care gaps reviewed/updated  2. Postmenopausal - no HRT  3. Encounter for screening mammogram for malignant neoplasm of breast - MM 3D SCREEN BREAST BILATERAL; Future  4. Intraductal papilloma of breast, left - initial biopsy showed sclerosing lesion but final pathology showed papilloma  5. Hypoestrogenism - DG BONE DENSITY (DXA); Future  6. OAB (overactive bladder)

## 2021-05-19 ENCOUNTER — Encounter: Payer: Self-pay | Admitting: Internal Medicine

## 2021-05-19 ENCOUNTER — Other Ambulatory Visit: Payer: Self-pay

## 2021-05-19 ENCOUNTER — Ambulatory Visit: Payer: Medicare Other | Admitting: Internal Medicine

## 2021-05-19 DIAGNOSIS — R058 Other specified cough: Secondary | ICD-10-CM | POA: Diagnosis not present

## 2021-05-19 MED ORDER — GABAPENTIN 100 MG PO CAPS: ORAL_CAPSULE | ORAL | 2 refills | Status: DC

## 2021-05-19 NOTE — Patient Instructions (Addendum)
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  Hold for up to 5 seconds if you can. Blow out thru nose. Rinse and gargle with water when done.  If mouth or throat bother you at all,  try brushing teeth/gums/tongue with arm and hammer toothpaste/ make a slurry and gargle and spit out.   Practice like a golfer and try spacing out symbicort to where you take it one pff 4 x daily  Please schedule a follow up office visit in 6 weeks, call sooner if needed

## 2021-05-19 NOTE — Progress Notes (Signed)
Amy Williamson, female    DOB: 03/28/1951     MRN: [id number]   Brief patient profile:  4 yowf never smoker referred to pulmonary clinic 04/20/2021 by Dr Fransico Him  for refractory cough/wheeze that pt dates back  to w/in a week or two of new start for OSA with cpap/ nasal pillows (can't tol full face mask)   Onset on her 62's sev times a year bad cough > dx bronchitis saw Lenna Gilford  rx steroid shot/ abx 100% better sev times a year no def season and in 2015  (at wt 151) assoc with dysphagia better on PPI one hour ac daily and fine for the last year(since fall 2021) until sev weeks p started the CPAP   June 2022 and since then rx  amox/ prednisone which helped a lot Sept 2  2022 x 12 days but then w/in a week same problem.   History of Present Illness  04/20/2021  Pulmonary/ 1st office eval/Infant[facility] Villagomez  Chief Complaint  Patient presents with   Pulmonary Consult    Referred by Fransico Him, MD. Pt c/o cough and wheezing x 6 wks- cough is prod with yellow sputum.  She states symptoms worse with exertion.   Dyspnea:  can no longer walk [employer] due to sob/ wheeze  Cough: ok while on cpap then starts up cough/ wheeze  in the am > mucus yellow again  Sleep: does fine on cpap thru the night lying flat/ nasal pillows  SABA use: last used 04/20/21 and not on day of ov Recent tooth infection L Lower molars  and persistent daytime nasal congestion despite abx  Rec Plan A = Automatic = Always=    Breztri 1 puff 1st thing in am and then 12 hour later  Work on inhaler technique:  Once you have used up the breztri or have mastered the technique change to symbicort 80 Take 2 puffs first thing in am and then another 2 puffs about 12 hours later.   Plan B = Backup (to supplement plan A, not to replace it) Only use your albuterol inhaler as a rescue medication Prednisone 10 mg take  4 each am x 2 days,   2 each am x 2 days,  1 each am x 2 days and stop  Pantoprazole (protonix) 40 mg (omeprazole 20 x 2)    Take   30-60 min before first meal of the day and Pepcid (famotidine)  20 mg after supper until return to office  GERD diet reviewed, bed blocks rec  Augmentin 875 mg take one pill twice daily  X 10 days - take at breakfast and supper with large glass of water.      We will call you to schedule sinus ct > not done  Please schedule a follow up office visit in 4 weeks, sooner if needed    05/19/2021  f/u ov/Zephaniah Lubrano re: uacs vs asthma   maint on symbicort 80 Take 2 puffs first thing in am and then another 2 puffs about 12 hours later.  Chief Complaint  Patient presents with   Follow-up    Cough and wheezing slightly improved. She is still coughing up yellow sputum in the am's. She is using her albuterol inhaler 2 x per wk on average.    Dyspnea:  last walked at [employer]  sev week prior to OV   Cough: p wake and stir w/in 1st hour starts coughing  Sleeping: poorly due back pain not cough  SABA use: much  less on symbicort  02: none  Covid status:   vax x 3    No obvious day to day or daytime variability or assoc excess/ purulent sputum or mucus plugs or hemoptysis or cp or chest tightness, subjective wheeze or overt sinus or hb symptoms.    Also denies any obvious fluctuation of symptoms with weather or environmental changes or other aggravating or alleviating factors except as outlined above   No unusual exposure hx or h/o childhood pna/ asthma or knowledge of premature birth.  Current Allergies, Complete Past Medical History, Past Surgical History, Family History, and Social History were reviewed in [employer] record.  ROS  The following are not active complaints unless bolded Hoarseness, sore throat, dysphagia, dental problems, itching, sneezing,  nasal congestion or discharge of excess mucus or purulent secretions, ear ache,   fever, chills, sweats, unintended wt loss or wt gain, classically pleuritic or exertional cp,  orthopnea pnd or arm/hand swelling  or leg swelling,  presyncope, palpitations, abdominal pain, anorexia, nausea, vomiting, diarrhea  or change in bowel habits or change in bladder habits, change in stools or change in urine, dysuria, hematuria,  rash, arthralgias, visual complaints, headache, numbness, weakness or ataxia or problems with walking or coordination,  change in mood or  memory.        Current Meds  Medication Sig   Acetaminophen (TYLENOL ARTHRITIS PAIN PO) Take 2 tablets by mouth 2 (two) times daily as needed.   albuterol (VENTOLIN HFA) 108 (90 Base) MCG/ACT inhaler Inhale 1-2 puffs into the lungs every 6 (six) hours as needed for wheezing or shortness of breath.   ALPRAZolam (XANAX) 0.25 MG tablet Take 1 tablet (0.25 mg total) by mouth daily as needed for anxiety.   apixaban (ELIQUIS) 5 MG TABS tablet Take 1 tablet (5 mg total) by mouth 2 (two) times daily.   budesonide-formoterol (SYMBICORT) 80-4.5 MCG/ACT inhaler Take 2 puffs first thing in am and then another 2 puffs about 12 hours later.   diltiazem (CARDIZEM CD) 240 MG 24 hr capsule Take 1 capsule (240 mg total) by mouth daily.   loratadine (CLARITIN) 10 MG tablet Take 10 mg by mouth at bedtime. Sinus drainage   pantoprazole (PROTONIX) 40 MG tablet Take 1 tablet (40 mg total) by mouth daily. Take 30-60 min before first meal of the day   rosuvastatin (CRESTOR) 20 MG tablet Take 1 tablet (20 mg total) by mouth daily.   traZODone (DESYREL) 50 MG tablet TAKE 0.5-1 TABLETS BY MOUTH AT BEDTIME AS NEEDED FOR SLEEP.   triamcinolone (NASACORT) 55 MCG/ACT AERO nasal inhaler Place 2 sprays into the nose daily as needed (allergies).              Past Medical History:  Diagnosis Date   Anticoagulant long-term use    eliquis --- managed by cardiology   Arthritis    BACK AND SHOULDERS   Depressive disorder, not elsewhere classified    Essential hypertension    followed by pcp and cardiology   Frequency of urination    GERD (gastroesophageal reflux disease)    History of dysphagia  2015   s/p egd w/ dilation, pt did not have stricture   History of gastritis    OSA (obstructive sleep apnea)    followed by dr t. turner---  study in epic 09-03-2020 moderate osa ,  pt scheduled for cpap titration 12-08-2020   PAF (paroxysmal atrial fibrillation) [facility])    cardiologist-- [name]---- new onset 06-30-2020  at pcp office visit,  s/p DCCV 07-28-2020 successful   Palpitations 06/2020   11-16-2020  per pt denies palpitations since DCCV 07-28-2020   Plantar fasciitis 08/2017   left foot    PMB (postmenopausal bleeding)    Pure hypercholesterolemia         Objective:      Wt Readings from Last 3 Encounters:  05/15/21 216 lb 6.4 oz (98.2 kg)  04/20/21 212 lb 9.6 oz (96.4 kg)  03/31/21 216 lb 12.8 oz (98.3 kg)      Vital signs reviewed  05/19/2021  - Note at rest 02 sats  100% on RA    General appearance:    amb hoarse wf nad / mild pseudowheeze     HEENT : pt wearing mask not removed for exam due to covid -19 concerns.    NECK :  without JVD/Nodes/TM/ nl carotid upstrokes bilaterally   LUNGS: no acc muscle use,  Nl contour chest with some transmitted upper airway "wheezing" better with PLB    without cough on insp or exp maneuvers   CV:  RRR  no s3 or murmur or increase in P2, and no edema   ABD:  soft and nontender with nl inspiratory excursion in the supine position. No bruits or organomegaly appreciated, bowel sounds nl  MS:  Nl gait/ ext warm without deformities, calf tenderness, cyanosis or clubbing No obvious joint restrictions   SKIN: warm and dry without lesions    NEURO:  alert, approp, nl sensorium with  no motor or cerebellar deficits apparent.           I personally reviewed images and agree with radiology impression as follows:  CXR:   pa and lateral  03/23/21 No acute cardiopulmonary disease.     Assessment

## 2021-05-20 NOTE — Assessment & Plan Note (Addendum)
Onset on her 79s worse p started CPAP 12/2020 with nasal pillows  -  Sinus ct 04/20/2021 >>> done by Dr Toy Cookey and report requested   - 05/19/2021  After extensive coaching inhaler device,  effectiveness =    75% from baseline of 25%  -  Gabapentin trial 05/19/2021   Difficult to tease out the different components of her cough so wrec continue low does symbicort (less likely to aggravate uacs) and titrate gabapentin up if tolerates for the  UACS plus 1st gen H1 blockers per guidelines  To eliminate all pnds.         Each maintenance medication was reviewed in detail including emphasizing most importantly the difference between maintenance and prns and under what circumstances the prns are to be triggered using an action plan format where appropriate.  Total time for H and P, chart review, counseling  and generating customized AVS unique to this office visit / same day charting = 25 min

## 2021-05-21 ENCOUNTER — Encounter: Payer: Self-pay | Admitting: Internal Medicine

## 2021-05-24 ENCOUNTER — Telehealth: Payer: Self-pay | Admitting: Internal Medicine

## 2021-05-30 ENCOUNTER — Other Ambulatory Visit (HOSPITAL_BASED_OUTPATIENT_CLINIC_OR_DEPARTMENT_OTHER): Payer: Self-pay | Admitting: Obstetrics & Gynecology

## 2021-05-30 DIAGNOSIS — E2839 Other primary ovarian failure: Secondary | ICD-10-CM

## 2021-05-31 ENCOUNTER — Telehealth: Payer: Self-pay | Admitting: Internal Medicine

## 2021-05-31 NOTE — Telephone Encounter (Signed)
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  She is being managed for aggressive risk factor reduction.  At next visit, if she has carotid bruit, or signs or symptoms of carotid disease will get carotid duplex.  Otherwise, we will discuss the pross and cons of asymptomatics screening.  Rudean Haskell, MD [address] [place], [place] [address] [phone]  2:40 PM

## 2021-06-13 NOTE — Telephone Encounter (Signed)
I spoke with Dr Corky Sing office  Was advised that Dr Toy Cookey personally dropped off disc of her CT scan  I do not see this in Dr Gustavus Bryant lookat in A pod  Dr Melvyn Novas- do you recall seeing this anywhere?

## 2021-06-13 NOTE — Telephone Encounter (Signed)
Amy Williamson, please advise if you have seen a disc on pt.

## 2021-06-13 NOTE — Telephone Encounter (Signed)
Sorry I haven't seen it - they can just fax a report if they want to bring any specific issue to my attention.

## 2021-06-13 NOTE — Telephone Encounter (Signed)
ATC Dr. Corky Sing office to let them know that we are unable to locate the disc for this patient, the office is closed and will not be open again until 06/19/2021. Unable to leave message.

## 2021-06-14 NOTE — Telephone Encounter (Signed)
I was able to find the disc and placed in A pod for MW to review when he returns to [facility] clinic.

## 2021-06-30 ENCOUNTER — Other Ambulatory Visit: Payer: Self-pay

## 2021-06-30 ENCOUNTER — Ambulatory Visit (INDEPENDENT_AMBULATORY_CARE_PROVIDER_SITE_OTHER): Payer: Medicare Other

## 2021-06-30 DIAGNOSIS — Z789 Other specified health status: Secondary | ICD-10-CM | POA: Diagnosis not present

## 2021-06-30 DIAGNOSIS — Z0000 Encounter for general adult medical examination without abnormal findings: Secondary | ICD-10-CM

## 2021-06-30 NOTE — Patient Instructions (Signed)
Amy Williamson , Thank you for taking time to come for your Medicare Wellness Visit. I appreciate your ongoing commitment to your health goals. Please review the following plan we discussed and let me know if I can assist you in the future.   Screening recommendations/referrals: Colonoscopy: 03/12/2012  due 2023 Mammogram: Scheduled 07/06/2021 Bone Density: 12/08/2015 Recommended yearly ophthalmology/optometry visit for glaucoma screening and checkup Recommended yearly dental visit for hygiene and checkup  Vaccinations: Influenza vaccine: completed  Pneumococcal vaccine: completed  Tdap vaccine: 08/04/2015 Shingles vaccine: completed     Advanced directives: will provide copies  Conditions/risks identified: none   Next appointment: none    Preventive Care 30 Years and Older, Female Preventive care refers to lifestyle choices and visits with your health care provider that can promote health and wellness. What does preventive care include? A yearly physical exam. This is also called an annual well check. Dental exams once or twice a year. Routine eye exams. Ask your health care provider how often you should have your eyes checked. Personal lifestyle choices, including: Daily care of your teeth and gums. Regular physical activity. Eating a healthy diet. Avoiding tobacco and drug use. Limiting alcohol use. Practicing safe sex. Taking low-dose aspirin every day. Taking vitamin and mineral supplements as recommended by your health care provider. What happens during an annual well check? The services and screenings done by your health care provider during your annual well check will depend on your age, overall health, lifestyle risk factors, and family history of disease. Counseling  Your health care provider may ask you questions about your: Alcohol use. Tobacco use. Drug use. Emotional well-being. Home and relationship well-being. Sexual activity. Eating habits. History of  falls. Memory and ability to understand (cognition). Work and work Statistician. Reproductive health. Screening  You may have the following tests or measurements: Height, weight, and BMI. Blood pressure. Lipid and cholesterol levels. These may be checked every 5 years, or more frequently if you are over 23 years old. Skin check. Lung cancer screening. You may have this screening every year starting at age 23 if you have a 30-pack-year history of smoking and currently smoke or have quit within the past 15 years. Fecal occult blood test (FOBT) of the stool. You may have this test every year starting at age 57. Flexible sigmoidoscopy or colonoscopy. You may have a sigmoidoscopy every 5 years or a colonoscopy every 10 years starting at age 82. Hepatitis C blood test. Hepatitis B blood test. Sexually transmitted disease (STD) testing. Diabetes screening. This is done by checking your blood sugar (glucose) after you have not eaten for a while (fasting). You may have this done every 1-3 years. Bone density scan. This is done to screen for osteoporosis. You may have this done starting at age 53. Mammogram. This may be done every 1-2 years. Talk to your health care provider about how often you should have regular mammograms. Talk with your health care provider about your test results, treatment options, and if necessary, the need for more tests. Vaccines  Your health care provider may recommend certain vaccines, such as: Influenza vaccine. This is recommended every year. Tetanus, diphtheria, and acellular pertussis (Tdap, Td) vaccine. You may need a Td booster every 10 years. Zoster vaccine. You may need this after age 50. Pneumococcal 13-valent conjugate (PCV13) vaccine. One dose is recommended after age 36. Pneumococcal polysaccharide (PPSV23) vaccine. One dose is recommended after age 39. Talk to your health care provider about which screenings and vaccines you  need and how often you need  them. This information is not intended to replace advice given to you by your health care provider. Make sure you discuss any questions you have with your health care provider. Document Released: 08/05/2015 Document Revised: 03/28/2016 Document Reviewed: 05/10/2015 Elsevier Interactive Patient Education  2017 [place] Prevention in the Home Falls can cause injuries. They can happen to people of all ages. There are many things you can do to make your home safe and to help prevent falls. What can I do on the outside of my home? Regularly fix the edges of walkways and driveways and fix any cracks. Remove anything that might make you trip as you walk through a door, such as a raised step or threshold. Trim any bushes or trees on the path to your home. Use bright outdoor lighting. Clear any walking paths of anything that might make someone trip, such as rocks or tools. Regularly check to see if handrails are loose or broken. Make sure that both sides of any steps have handrails. Any raised decks and porches should have guardrails on the edges. Have any leaves, snow, or ice cleared regularly. Use sand or salt on walking paths during winter. Clean up any spills in your garage right away. This includes oil or grease spills. What can I do in the bathroom? Use night lights. Install grab bars by the toilet and in the tub and shower. Do not use towel bars as grab bars. Use non-skid mats or decals in the tub or shower. If you need to sit down in the shower, use a plastic, non-slip stool. Keep the floor dry. Clean up any water that spills on the floor as soon as it happens. Remove soap buildup in the tub or shower regularly. Attach bath mats securely with double-sided non-slip rug tape. Do not have throw rugs and other things on the floor that can make you trip. What can I do in the bedroom? Use night lights. Make sure that you have a light by your bed that is easy to reach. Do not use  any sheets or blankets that are too big for your bed. They should not hang down onto the floor. Have a firm chair that has side arms. You can use this for support while you get dressed. Do not have throw rugs and other things on the floor that can make you trip. What can I do in the kitchen? Clean up any spills right away. Avoid walking on wet floors. Keep items that you use a lot in easy-to-reach places. If you need to reach something above you, use a strong step stool that has a grab bar. Keep electrical cords out of the way. Do not use floor polish or wax that makes floors slippery. If you must use wax, use non-skid floor wax. Do not have throw rugs and other things on the floor that can make you trip. What can I do with my stairs? Do not leave any items on the stairs. Make sure that there are handrails on both sides of the stairs and use them. Fix handrails that are broken or loose. Make sure that handrails are as long as the stairways. Check any carpeting to make sure that it is firmly attached to the stairs. Fix any carpet that is loose or worn. Avoid having throw rugs at the top or bottom of the stairs. If you do have throw rugs, attach them to the floor with carpet tape. Make sure that  you have a light switch at the top of the stairs and the bottom of the stairs. If you do not have them, ask someone to add them for you. What else can I do to help prevent falls? Wear shoes that: Do not have high heels. Have rubber bottoms. Are comfortable and fit you well. Are closed at the toe. Do not wear sandals. If you use a stepladder: Make sure that it is fully opened. Do not climb a closed stepladder. Make sure that both sides of the stepladder are locked into place. Ask someone to hold it for you, if possible. Clearly mark and make sure that you can see: Any grab bars or handrails. First and last steps. Where the edge of each step is. Use tools that help you move around (mobility aids)  if they are needed. These include: Canes. Walkers. Scooters. Crutches. Turn on the lights when you go into a dark area. Replace any light bulbs as soon as they burn out. Set up your furniture so you have a clear path. Avoid moving your furniture around. If any of your floors are uneven, fix them. If there are any pets around you, be aware of where they are. Review your medicines with your doctor. Some medicines can make you feel dizzy. This can increase your chance of falling. Ask your doctor what other things that you can do to help prevent falls. This information is not intended to replace advice given to you by your health care provider. Make sure you discuss any questions you have with your health care provider. Document Released: 05/05/2009 Document Revised: 12/15/2015 Document Reviewed: 08/13/2014 Elsevier Interactive Patient Education  2017 [employer].

## 2021-06-30 NOTE — Progress Notes (Signed)
Subjective:   Amy Williamson is a 70 y.o. female who presents for Medicare Annual (Subsequent) preventive examination.  I connected with Helene Kelp today by telephone and verified that I am speaking with the correct person using two identifiers. Location patient: home Location provider: work Persons participating in the virtual visit: patient, provider.   I discussed the limitations, risks, security and privacy concerns of performing an evaluation and management service by telephone and the availability of in person appointments. I also discussed with the patient that there may be a patient responsible charge related to this service. The patient expressed understanding and verbally consented to this telephonic visit.    Interactive audio and video telecommunications were attempted between this provider and patient, however failed, due to patient having technical difficulties OR patient did not have access to video capability.  We continued and completed visit with audio only.    Review of Systems     Cardiac Risk Factors include: advanced age (>37men, >23 women);hypertension;dyslipidemia     Objective:    Today's Vitals   There is no height or weight on file to calculate BMI.  Advanced Directives 06/30/2021 12/08/2020 11/22/2020 09/03/2020 07/28/2020 06/09/2020 06/02/2020  Does Patient Have a Medical Advance Directive? Yes Yes Yes Yes Yes Yes Yes  Type of Paramedic of [place];Living will [place];Living will [place];Living will [place];Living will [place];Living will Living will;Healthcare Power of Attorney Living will;Healthcare Power of Attorney  Does patient want to make changes to medical advance directive? - No - Patient declined - No - Patient declined - No - Patient declined -  Copy of [place] in Chart? No - copy requested No - copy requested Yes -  validated most recent copy scanned in chart (See row information) - No - copy requested No - copy requested No - copy requested  Would patient like information on creating a medical advance directive? - - - - - - -    Current Medications (verified) Outpatient Encounter Medications as of 06/30/2021  Medication Sig   Acetaminophen (TYLENOL ARTHRITIS PAIN PO) Take 2 tablets by mouth 2 (two) times daily as needed.   ALPRAZolam (XANAX) 0.25 MG tablet Take 1 tablet (0.25 mg total) by mouth daily as needed for anxiety.   apixaban (ELIQUIS) 5 MG TABS tablet Take 1 tablet (5 mg total) by mouth 2 (two) times daily.   budesonide-formoterol (SYMBICORT) 80-4.5 MCG/ACT inhaler Take 2 puffs first thing in am and then another 2 puffs about 12 hours later.   diltiazem (CARDIZEM CD) 240 MG 24 hr capsule Take 1 capsule (240 mg total) by mouth daily.   pantoprazole (PROTONIX) 40 MG tablet Take 1 tablet (40 mg total) by mouth daily. Take 30-60 min before first meal of the day   rosuvastatin (CRESTOR) 20 MG tablet Take 1 tablet (20 mg total) by mouth daily.   traZODone (DESYREL) 50 MG tablet TAKE 0.5-1 TABLETS BY MOUTH AT BEDTIME AS NEEDED FOR SLEEP.   triamcinolone (NASACORT) 55 MCG/ACT AERO nasal inhaler Place 2 sprays into the nose daily as needed (allergies).   albuterol (VENTOLIN HFA) 108 (90 Base) MCG/ACT inhaler Inhale 1-2 puffs into the lungs every 6 (six) hours as needed for wheezing or shortness of breath. (Patient not taking: Reported on 06/30/2021)   gabapentin (NEURONTIN) 100 MG capsule One four times a day (Patient not taking: Reported on 06/30/2021)   loratadine (CLARITIN) 10 MG tablet  Take 10 mg by mouth at bedtime. Sinus drainage (Patient not taking: Reported on 06/30/2021)   No facility-administered encounter medications on file as of 06/30/2021.    Allergies (verified) Atenolol, Avelox [moxifloxacin hcl in nacl], Azithromycin, Doxycycline, Myrbetriq [mirabegron], and Oxybutynin   History: Past  Medical History:  Diagnosis Date   Anticoagulant long-term use    eliquis --- managed by cardiology   Arthritis    BACK AND SHOULDERS   Depressive disorder, not elsewhere classified    Essential hypertension    followed by pcp and cardiology   Frequency of urination    GERD (gastroesophageal reflux disease)    History of dysphagia 2015   s/p egd w/ dilation, pt did not have stricture   History of gastritis    OSA (obstructive sleep apnea)    followed by dr t. turner---  study in epic 09-03-2020 moderate osa ,  pt scheduled for cpap titration 12-08-2020   PAF (paroxysmal atrial fibrillation) [facility])    cardiologist-- [name]---- new onset 06-30-2020 at pcp office visit,  s/p DCCV 07-28-2020 successful   Palpitations 06/2020   11-16-2020  per pt denies palpitations since DCCV 07-28-2020   Plantar fasciitis 08/2017   left foot    Pure hypercholesterolemia    Past Surgical History:  Procedure Laterality Date   BREAST LUMPECTOMY WITH RADIOACTIVE SEED LOCALIZATION Left 06/09/2020   Procedure: LEFT BREAST LUMPECTOMY WITH RADIOACTIVE SEED LOCALIZATION;  Surgeon: Coralie Keens, MD;  Location: [place];  Service: General;  Laterality: Left;  LMA   CARDIOVERSION N/A 07/28/2020   Procedure: CARDIOVERSION;  Surgeon: Werner Lean, MD;  Location: [facility];  Service: Cardiovascular;  Laterality: N/A;   CARPAL TUNNEL RELEASE Bilateral right 2004;  left 2006   CATARACT EXTRACTION W/ INTRAOCULAR LENS  IMPLANT, BILATERAL  2016   CESAREAN SECTION  [id number]   BILATERAL TUBAL LIGATION W/ LAST C/S   COLONOSCOPY WITH PROPOFOL  last one 08/ 2013   [place] N/A 11/22/2020   Procedure: [place];  Surgeon: Megan Salon, MD;  Location: [place];  Service: Gynecology;  Laterality: N/A;   EAR CYST EXCISION  08/03/2011   patient states this is incorrect    ESOPHAGOGASTRODUODENOSCOPY (EGD) WITH ESOPHAGEAL DILATION  08/2013   KNEE ARTHROSCOPY W/ MENISCAL REPAIR Left 08-03-2011  @[facility]    and excision baker's cyst   PULLEY RELEASE RIGHT TRIGGER FINGER Right 01/08/2011   TRIGGER FINGER RELEASE Bilateral left 05/06/14;  right  02-29-2014   Family History  Problem Relation Age of Onset   Heart failure Mother    Osteoporosis Mother    Hyperlipidemia Mother    Hypertension Mother    Heart disease Father    Clotting disorder Father    Liver cancer Brother    Liver disease Maternal Aunt        x2   Colon cancer Neg Hx    Social History   Socioeconomic History   Marital status: Married    Spouse name: david   Number of children: 2   Years of education: Not on file   Highest education level: Not on file  Occupational History   Occupation: retired  Tobacco Use   Smoking status: Never   Smokeless tobacco: Never  Vaping Use   Vaping Use: Never used  Substance and Sexual Activity   Alcohol use: No   Drug use: Never   Sexual activity: Not on file    Comment: BTL  Other Topics Concern   Not on file  Social History Narrative   Not on file   Social Determinants of Health   Financial Resource Strain: Low Risk    Difficulty of Paying Living Expenses: Not hard at all  Food Insecurity: No Food Insecurity   Worried About Charity fundraiser in the Last Year: Never true   [place] in the Last Year: Never true  Transportation Needs: No Transportation Needs   Lack of Transportation (Medical): No   Lack of Transportation (Non-Medical): No  Physical Activity: Inactive   Days of Exercise per Week: 0 days   Minutes of Exercise per Session: 0 min  Stress: No Stress Concern Present   Feeling of Stress : Not at all  Social Connections: Moderately Integrated   Frequency of Communication with Friends and Family: Twice a week   Frequency of Social Gatherings with Friends and Family: Twice a week   Attends Religious Services: More than 4  times per year   Active Member of [employer] or Organizations: No   Attends Music therapist: Never   Marital Status: Married    Tobacco Counseling Counseling given: Not Answered   Clinical Intake:  Pre-visit preparation completed: Yes  Pain : No/denies pain     Nutritional Risks: None Diabetes: No  How often do you need to have someone help you when you read instructions, pamphlets, or other written materials from your doctor or pharmacy?: 1 - Never What is the last grade level you completed in school?: [place]  Interpreter Needed?: No  Information entered by :: L.Karyssa Amaral,LPN   Activities of Daily Living In your present state of health, do you have any difficulty performing the following activities: 06/30/2021 11/22/2020  Hearing? N N  Vision? N N  Difficulty concentrating or making decisions? N N  Walking or climbing stairs? N Y  Comment - climbing stairs  Dressing or bathing? N N  Doing errands, shopping? N -  Preparing Food and eating ? N -  Using the Toilet? N -  In the past six months, have you accidently leaked urine? N -  Do you have problems with loss of bowel control? N -  Managing your Medications? N -  Managing your Finances? N -  Housekeeping or managing your Housekeeping? N -  Some recent data might be hidden    Patient Care Team: Hoyt Koch, MD as PCP - General (Internal Medicine) Sueanne Margarita, MD as PCP - Sleep Medicine (Cardiology) Lafayette Dragon, MD (Inactive) (Gastroenterology) Megan Salon, MD (Gynecology) Peggye Form, MD (Gastroenterology) Calvert Cantor, MD (Ophthalmology) Roseanne Kaufman, MD (Orthopedic Surgery)  Indicate any recent Medical Services you may have received from other than [facility] providers in the past year (date may be approximate).     Assessment:   This is a routine wellness examination for [place].  Hearing/Vision screen Vision Screening - Comments:: Annual eye exams wear  glasses   Dietary issues and exercise activities discussed: Current Exercise Habits: The patient does not participate in regular exercise at present, Exercise limited by: orthopedic condition(s)   Goals Addressed   None    Depression Screen PHQ 2/9 Scores 06/30/2021 05/15/2021 09/19/2018 05/09/2018 05/07/2017 02/01/2016 03/16/2014  PHQ - 2 Score 0 0 4 0 0 0 0  PHQ- 9 Score - - 15 - - - -    Fall Risk Fall Risk  06/30/2021 06/30/2020 05/12/2019 05/09/2018 05/07/2017  Falls in the past year? 0  0 0 No No  Number falls in past yr: 0 0 - - -  Injury with Fall? 0 0 - - -  Follow up Falls evaluation completed - - - -    FALL RISK PREVENTION PERTAINING TO THE HOME:  Any stairs in or around the home? Yes  If so, are there any without handrails? No  Home free of loose throw rugs in walkways, pet beds, electrical cords, etc? Yes  Adequate lighting in your home to reduce risk of falls? Yes   ASSISTIVE DEVICES UTILIZED TO PREVENT FALLS:  Life alert? No  Use of a cane, walker or w/c? No  Grab bars in the bathroom? Yes  Shower chair or bench in shower? Yes  Elevated toilet seat or a handicapped toilet? Yes    Cognitive Function:    Normal cognitive status assessed by direct observation by this Nurse Health Advisor. No abnormalities found.      Immunizations Immunization History  Administered Date(s) Administered   Influenza Split 04/24/2011, 05/03/2012, 04/22/2013   Influenza, High Dose Seasonal PF 03/30/2015, 04/02/2016, 05/07/2017, 05/09/2018, 04/17/2019, 03/28/2021   Influenza,inj,Quad PF,6+ Mos 03/16/2014   Influenza-Unspecified 04/17/2019, 04/06/2020   PFIZER(Purple Top)SARS-COV-2 Vaccination 09/15/2019, 10/06/2019, 04/26/2020   Pneumococcal Conjugate-13 03/30/2015   Pneumococcal Polysaccharide-23 07/23/2004, 05/07/2017   Tdap 07/23/2004, 08/04/2015   Zoster Recombinat (Shingrix) 05/17/2017, 10/02/2017   Zoster, Live 05/23/2013    TDAP status: Up to date  Flu Vaccine  status: Up to date  Pneumococcal vaccine status: Up to date  Covid-19 vaccine status: Completed vaccines  Qualifies for Shingles Vaccine? Yes   Zostavax completed Yes   Shingrix Completed?: Yes  Screening Tests Health Maintenance  Topic Date Due   COVID-19 Vaccine (4 - Booster for Pfizer series) 06/21/2020   COLONOSCOPY (Pts 45-73yrs Insurance coverage will need to be confirmed)  03/12/2022   MAMMOGRAM  04/12/2022   TETANUS/TDAP  08/03/2025   Pneumonia Vaccine 72+ Years old  Completed   INFLUENZA VACCINE  Completed   DEXA SCAN  Completed   Hepatitis C Screening  Completed   Zoster Vaccines- Shingrix  Completed   HPV VACCINES  Aged Out    Health Maintenance  Health Maintenance Due  Topic Date Due   COVID-19 Vaccine (4 - Booster for [place] series) 06/21/2020    Colorectal cancer screening: Type of screening: Colonoscopy. Completed 03/12/2012. Repeat every 10 years  Mammogram status: Completed scheduled 07/06/2021. Repeat every year  Bone Density status: Completed 12/08/2015. Results reflect: Bone density results: OSTEOPENIA. Repeat every 5 years.  Lung Cancer Screening: (Low Dose CT Chest recommended if Age 63-80 years, 30 pack-year currently smoking OR have quit w/in 15years.) does not qualify.   Lung Cancer Screening Referral: n/a  Additional Screening:  Hepatitis C Screening: does not qualify; Completed 08/04/2015  Vision Screening: Recommended annual ophthalmology exams for early detection of glaucoma and other disorders of the eye. Is the patient up to date with their annual eye exam?  Yes  Who is the provider or what is the name of the office in which the patient attends annual eye exams? Dr.Digby  If pt is not established with a provider, would they like to be referred to a provider to establish care? No .   Dental Screening: Recommended annual dental exams for proper oral hygiene  Community Resource Referral / Chronic Care Management: CRR required this  visit?  No   CCM required this visit?  No      Plan:     I have personally  reviewed and noted the following in the patient's chart:   Medical and social history Use of alcohol, tobacco or illicit drugs  Current medications and supplements including opioid prescriptions.  Functional ability and status Nutritional status Physical activity Advanced directives List of other physicians Hospitalizations, surgeries, and ER visits in previous 12 months Vitals Screenings to include cognitive, depression, and falls Referrals and appointments  In addition, I have reviewed and discussed with patient certain preventive protocols, quality metrics, and best practice recommendations. A written personalized care plan for preventive services as well as general preventive health recommendations were provided to patient.     Randel Pigg, LPN   [id number]   Nurse Notes: none

## 2021-07-03 ENCOUNTER — Ambulatory Visit (INDEPENDENT_AMBULATORY_CARE_PROVIDER_SITE_OTHER): Payer: Medicare Other | Admitting: Internal Medicine

## 2021-07-03 ENCOUNTER — Other Ambulatory Visit: Payer: Self-pay

## 2021-07-03 ENCOUNTER — Encounter: Payer: Self-pay | Admitting: Internal Medicine

## 2021-07-03 VITALS — BP 130/78 | HR 72 | Resp 18 | Ht 60.0 in | Wt 209.8 lb

## 2021-07-03 DIAGNOSIS — I4819 Other persistent atrial fibrillation: Secondary | ICD-10-CM

## 2021-07-03 DIAGNOSIS — K219 Gastro-esophageal reflux disease without esophagitis: Secondary | ICD-10-CM

## 2021-07-03 DIAGNOSIS — I700 Atherosclerosis of aorta: Secondary | ICD-10-CM

## 2021-07-03 DIAGNOSIS — I10 Essential (primary) hypertension: Secondary | ICD-10-CM

## 2021-07-03 DIAGNOSIS — Z0000 Encounter for general adult medical examination without abnormal findings: Secondary | ICD-10-CM

## 2021-07-03 DIAGNOSIS — R058 Other specified cough: Secondary | ICD-10-CM

## 2021-07-03 LAB — COMPREHENSIVE METABOLIC PANEL
ALT: 12 U/L (ref 0–35)
AST: 14 U/L (ref 0–37)
Albumin: 4.5 g/dL (ref 3.5–5.2)
Alkaline Phosphatase: 69 U/L (ref 39–117)
BUN: 18 mg/dL (ref 6–23)
CO2: 29 mEq/L (ref 19–32)
Calcium: 9.8 mg/dL (ref 8.4–10.5)
Chloride: 103 mEq/L (ref 96–112)
Creatinine, Ser: 0.84 mg/dL (ref 0.40–1.20)
GFR: 69.79 mL/min (ref >60.00)
Glucose, Bld: 105 mg/dL — ABNORMAL HIGH (ref 70–99)
Potassium: 4 mEq/L (ref 3.5–5.1)
Sodium: 140 mEq/L (ref 135–145)
Total Bilirubin: 0.6 mg/dL (ref 0.2–1.2)
Total Protein: 6.9 g/dL (ref 6.0–8.3)

## 2021-07-03 LAB — CBC
HCT: 39.9 % (ref 36.0–46.0)
Hemoglobin: 13.1 g/dL (ref 12.0–15.0)
MCHC: 32.9 g/dL (ref 30.0–36.0)
MCV: 83.8 fl (ref 78.0–100.0)
Platelets: 207 10*3/uL (ref 150.0–400.0)
RBC: 4.76 Mil/uL (ref 3.87–5.11)
RDW: 15.8 % — ABNORMAL HIGH (ref 11.5–15.5)
WBC: 7.5 10*3/uL (ref 4.0–10.5)

## 2021-07-03 LAB — HEMOGLOBIN A1C: Hgb A1c MFr Bld: 5.8 % (ref 4.6–6.5)

## 2021-07-03 LAB — MAGNESIUM: Magnesium: 2.1 mg/dL (ref 1.5–2.5)

## 2021-07-03 NOTE — Progress Notes (Signed)
   Subjective:   Patient ID: Amy Williamson, female    DOB: September 03, 1950, 70 y.o.   MRN: [id number]  HPI The patient is a 70 YO female coming in for physical.   PMH, [place], social history reviewed and updated  Review of Systems  Constitutional: Negative.   HENT:  Positive for tinnitus.   Eyes: Negative.   Respiratory:  Negative for cough, chest tightness and shortness of breath.   Cardiovascular:  Negative for chest pain, palpitations and leg swelling.  Gastrointestinal:  Negative for abdominal distention, abdominal pain, constipation, diarrhea, nausea and vomiting.  Musculoskeletal:  Positive for arthralgias and myalgias.  Skin: Negative.   Neurological: Negative.   Psychiatric/Behavioral: Negative.     Objective:  Physical Exam Constitutional:      Appearance: She is well-developed. She is obese.  HENT:     Head: Normocephalic and atraumatic.  Cardiovascular:     Rate and Rhythm: Normal rate and regular rhythm.  Pulmonary:     Effort: Pulmonary effort is normal. No respiratory distress.     Breath sounds: Normal breath sounds. No wheezing or rales.  Abdominal:     General: Bowel sounds are normal. There is no distension.     Palpations: Abdomen is soft.     Tenderness: There is no abdominal tenderness. There is no rebound.  Musculoskeletal:        General: Tenderness present.     Cervical back: Normal range of motion.  Skin:    General: Skin is warm and dry.  Neurological:     Mental Status: She is alert and oriented to person, place, and time.     Coordination: Coordination normal.    Vitals:   07/03/21 0832  BP: 130/78  Pulse: 72  Resp: 18  SpO2: 94%  Weight: 209 lb 12.8 oz (95.2 kg)  Height: 5' (1.524 m)    This visit occurred during the SARS-CoV-2 public health emergency.  Safety protocols were in place, including screening questions prior to the visit, additional usage of staff PPE, and extensive cleaning of exam room while observing appropriate contact time  as indicated for disinfecting solutions.   Assessment & Plan:

## 2021-07-03 NOTE — Patient Instructions (Addendum)
We will check the magnesium level today.  It is okay to stop the crestor (rosuvastatin) for 2-3 weeks to see if the aches get better. Let us know how you are feeling once off for that time.  The safe limit for tylenol is 3000 mg daily.

## 2021-07-05 ENCOUNTER — Telehealth: Payer: Self-pay | Admitting: Lab

## 2021-07-05 ENCOUNTER — Other Ambulatory Visit: Payer: Self-pay | Admitting: Internal Medicine

## 2021-07-05 NOTE — Chronic Care Management (AMB) (Signed)
°  Chronic Care Management   Note  07/05/2021 Name: Amy Williamson MRN: [id number] DOB: Jun 28, 1951  Amy Williamson is a 70 y.o. year old female who is a primary care patient of Hoyt Koch, MD. I reached out to [name] by phone today in response to a referral sent by [name] PCP, Hoyt Koch, MD.   [name] was given information about Chronic Care Management services today including:  CCM service includes personalized support from designated clinical staff supervised by her physician, including individualized plan of care and coordination with other care providers 24/7 contact phone numbers for assistance for urgent and routine care needs. Service will only be billed when office clinical staff spend 20 minutes or more in a month to coordinate care. Only one practitioner may furnish and bill the service in a calendar month. The patient may stop CCM services at any time (effective at the end of the month) by phone call to the office staff.   Patient agreed to services and verbal consent obtained.   Follow up plan:  [place]

## 2021-07-06 ENCOUNTER — Other Ambulatory Visit: Payer: Self-pay

## 2021-07-06 ENCOUNTER — Ambulatory Visit
Admission: RE | Admit: 2021-07-06 | Discharge: 2021-07-06 | Disposition: A | Discharge status: Home / Self Care | Payer: Medicare Other | Source: Ambulatory Visit | Attending: Obstetrics & Gynecology | Admitting: Obstetrics & Gynecology

## 2021-07-06 DIAGNOSIS — D242 Benign neoplasm of left breast: Secondary | ICD-10-CM

## 2021-07-06 DIAGNOSIS — Z1231 Encounter for screening mammogram for malignant neoplasm of breast: Secondary | ICD-10-CM

## 2021-07-06 NOTE — Assessment & Plan Note (Signed)
BP at goal, checking CMP and adjust diltiazem 240 mg daily as needed.

## 2021-07-06 NOTE — Assessment & Plan Note (Signed)
On diltiazem for rate control and eliquis for stroke prevention. Checking CBC and CMP and adjust as needed.

## 2021-07-06 NOTE — Assessment & Plan Note (Signed)
Flu shot up to date. Covid-19 counseled about booster. Pneumonia complete. Shingrix complete. Tetanus due 2027. Colonoscopy due 2023. Mammogram scheduled for Dec 2022, pap smear aged out and dexa scheduled for early 2023. Counseled about sun safety and mole surveillance. Counseled about the dangers of distracted driving. Given 10 year screening recommendations.

## 2021-07-06 NOTE — Assessment & Plan Note (Signed)
Taking protonix daily and symptoms controlled will continue.

## 2021-07-06 NOTE — Assessment & Plan Note (Signed)
Taking crestor 20 mg daily and recent lipid panel. She would like to hold crestor for 2-3 weeks and see if her muscle aches improve. Checking CMP and magnesium to see if there is a deficiency causing muscle aches. If they do not improve resume crestor 20 mg daily. BP at goal.

## 2021-07-06 NOTE — Assessment & Plan Note (Signed)
Taking symbicort and albuterol prn. No flare today.

## 2021-07-06 NOTE — Assessment & Plan Note (Signed)
Checking HgA1c for screening. Counseled about diet and exercise.

## 2021-07-11 NOTE — Telephone Encounter (Signed)
Les, are we able to close this? Thanks!

## 2021-07-25 ENCOUNTER — Ambulatory Visit (INDEPENDENT_AMBULATORY_CARE_PROVIDER_SITE_OTHER): Payer: Medicare Other

## 2021-07-25 ENCOUNTER — Other Ambulatory Visit: Payer: Self-pay

## 2021-07-25 DIAGNOSIS — I4819 Other persistent atrial fibrillation: Secondary | ICD-10-CM

## 2021-07-25 DIAGNOSIS — K219 Gastro-esophageal reflux disease without esophagitis: Secondary | ICD-10-CM

## 2021-07-25 DIAGNOSIS — I700 Atherosclerosis of aorta: Secondary | ICD-10-CM

## 2021-07-25 DIAGNOSIS — I10 Essential (primary) hypertension: Secondary | ICD-10-CM

## 2021-07-25 NOTE — Progress Notes (Signed)
Chronic Care Management Pharmacy Note  07/25/2021 Name:  Amy Williamson MRN:  [id number] DOB:  [birth year]  Summary: -Patient reports that since stopping crestor has noticed a slight improvement in muscle aches / pains she was previously having  -Reports that blood pressures at home average around 140/70 - notes she thinks her BP cuff is not calibrated correctly as her BP runs lower when checked in office  -Has been using a new mask fitted by her dentist rather than previous CPAP mask, patient no longer reports to her respiratory issues, not having to use symbicort inhaler as often - helpful when used -Trazodone has been help for her sleep, notes to infrequent alprazolam use for anxiety  -Eliquis affordable at this time, copays increase around July - will plan to submit PAP at that time as [employer] requires 3% of annual income to be spent on [place] expenses before being accepted into program   Recommendations/Changes made from today's visit: -Recommending for patient to restart crestor at 46m daily (was able to previously tolerate at this dose) - LDL when on this dose was was 88 - encouraged increase in activity and exercise / decrease in high cholesterol food intake - should LDL remain elevated from goal could trial atorvastatin / addition of ezetimibe  -Patient to continue to monitor blood pressure, reach out should BP >>944/46-Submit PAP application for eliquis in July when patient would reach 3% OOP expense requirement   Plan: -F/u in 5 months   Subjective: Amy FERRELLis an 71y.o. year old female who is a primary patient of CHoyt Koch MD.  The CCM team was consulted for assistance with disease management and care coordination needs.    Engaged with patient face to face for initial visit in response to provider referral for pharmacy case management and/or care coordination services.   Consent to Services:  The patient was given the following information about  Chronic Care Management services today, agreed to services, and gave verbal consent: 1. CCM service includes personalized support from designated clinical staff supervised by the primary care provider, including individualized plan of care and coordination with other care providers 2. 24/7 contact phone numbers for assistance for urgent and routine care needs. 3. Service will only be billed when office clinical staff spend 20 minutes or more in a month to coordinate care. 4. Only one practitioner may furnish and bill the service in a calendar month. 5.The patient may stop CCM services at any time (effective at the end of the month) by phone call to the office staff. 6. The patient will be responsible for cost sharing (co-pay) of up to 20% of the service fee (after annual deductible is met). Patient agreed to services and consent obtained.  Patient Care Team: CHoyt Koch MD as PCP - General (Internal Medicine) TSueanne Margarita MD as PCP - Sleep Medicine (Cardiology) BLafayette Dragon MD (Inactive) (Gastroenterology) MMegan Salon MD (Gynecology) TPeggye Form MD (Gastroenterology) DCalvert Cantor MD (Ophthalmology) GRoseanne Kaufman MD (Orthopedic Surgery) SDelice BisonDDarnelle Maffucci RWashington [facility] Centeras Pharmacist (Pharmacist)  Recent office visits: 07/03/2021 - Dr. CSharlet Salina- patient would like to hold crestor due to muscle aches, agreeable to restart should muscle aches not improve - f/u in 6 months  03/28/2021 - Dr. CSharlet Salina- trazodone for sleep trial   Recent consult visits: 05/19/2021 - Dr. WMelvyn Novas- Pulmonology - trial of gabapentin for cough / wheeze  05/15/2021 - Dr. MSabra Heck- OB/GYN - no changes to  medications  04/20/2021 - Dr. Melvyn Novas -Pulmonology - augmenttin for sinus infection - symbicort prescribed - also given breztri sample, started on pantoprazole  03/31/2021 - Dr. Radford Pax - Cardiology - no changes to medications  01/31/2021 - Dr. Gasper Sells - Cardiology - no changes to medications, follow up in 1 year    Hospital visits: 03/23/2021 - Urgent care - acute bronchitis - prescribed albuterol inhaler and steroid taper   Objective:  Lab Results  Component Value Date   CREATININE 0.84 07/03/2021   BUN 18 07/03/2021   GFR 69.79 07/03/2021   GFRNONAA >60 11/28/2020   GFRAA 79 07/08/2020   NA 140 07/03/2021   K 4.0 07/03/2021   CALCIUM 9.8 07/03/2021   CO2 29 07/03/2021   GLUCOSE 105 (H) 07/03/2021    Lab Results  Component Value Date/Time   HGBA1C 5.8 07/03/2021 09:21 AM   HGBA1C 5.7 06/30/2020 09:03 AM   GFR 69.79 07/03/2021 09:21 AM   GFR 59.14 (L) 06/30/2020 09:03 AM    Last diabetic Eye exam:  No results found for: HMDIABEYEEXA  Last diabetic Foot exam:  No results found for: HMDIABFOOTEX   Lab Results  Component Value Date   CHOL 155 05/09/2021   HDL 62 05/09/2021   LDLCALC 72 05/09/2021   LDLDIRECT 142.3 03/16/2013   TRIG 118 05/09/2021   CHOLHDL 2.5 05/09/2021    Hepatic Function Latest Ref Rng & Units 07/03/2021 05/09/2021 11/28/2020  Total Protein 6.0 - 8.3 g/dL 6.9 5.9(L) 6.6  Albumin 3.5 - 5.2 g/dL 4.5 4.3 4.2  AST 0 - 37 U/L '14 14 19  ' ALT 0 - 35 U/L '12 14 20  ' Alk Phosphatase 39 - 117 U/L 69 70 63  Total Bilirubin 0.2 - 1.2 mg/dL 0.6 0.4 0.5  Bilirubin, Direct 0.00 - 0.40 mg/dL - 0.14 -    Lab Results  Component Value Date/Time   TSH 2.22 06/30/2020 09:03 AM   TSH 2.57 03/30/2015 08:33 AM    CBC Latest Ref Rng & Units 07/03/2021 11/28/2020 11/22/2020  WBC 4.0 - 10.5 K/uL 7.5 9.6 9.2  Hemoglobin 12.0 - 15.0 g/dL 13.1 12.9 13.5  Hematocrit 36.0 - 46.0 % 39.9 40.3 41.7  Platelets 150.0 - 400.0 K/uL 207.0 188 185    Lab Results  Component Value Date/Time   VD25OH 51.06 03/16/2014 12:17 PM   VD25OH 55 03/03/2012 09:49 AM   VD25OH 55 03/05/2011 10:14 AM    Clinical ASCVD: No  The 10-year ASCVD risk score (Arnett DK, et al., 2019) is: 13.3%   Values used to calculate the score:     Age: 62 years     Sex: Female     Is Non-Hispanic African American:  No     Diabetic: No     Tobacco smoker: No     Systolic Blood Pressure: 948 mmHg     Is BP treated: Yes     HDL Cholesterol: 62 mg/dL     Total Cholesterol: 155 mg/dL    Depression screen [facility] 2/9 06/30/2021 05/15/2021 09/19/2018  Decreased Interest 0 0 3  Down, Depressed, Hopeless 0 0 1  PHQ - 2 Score 0 0 4  Altered sleeping - - 3  Tired, decreased energy - - 3  Change in appetite - - 1  Feeling bad or failure about yourself  - - 0  Trouble concentrating - - 3  Moving slowly or fidgety/restless - - 1  Suicidal thoughts - - 0  PHQ-9 Score - - 15  Some recent  data might be hidden    Social History   Tobacco Use  Smoking Status Never  Smokeless Tobacco Never   BP Readings from Last 3 Encounters:  07/03/21 130/78  05/19/21 (!) 144/78  05/15/21 125/67   Pulse Readings from Last 3 Encounters:  07/03/21 72  05/19/21 79  05/15/21 71   Wt Readings from Last 3 Encounters:  07/03/21 209 lb 12.8 oz (95.2 kg)  05/19/21 215 lb (97.5 kg)  05/15/21 216 lb 6.4 oz (98.2 kg)   BMI Readings from Last 3 Encounters:  07/03/21 40.97 kg/m  05/19/21 41.99 kg/m  05/15/21 42.26 kg/m    Assessment/Interventions: Review of patient past medical history, allergies, medications, health status, including review of consultants reports, laboratory and other test data, was performed as part of comprehensive evaluation and provision of chronic care management services.   SDOH:  (Social Determinants of Health) assessments and interventions performed: Yes  SDOH Screenings   Alcohol Screen: Low Risk    Last Alcohol Screening Score (AUDIT): 0  Depression (PHQ2-9): Low Risk    PHQ-2 Score: 0  Financial Resource Strain: Low Risk    Difficulty of Paying Living Expenses: Not hard at all  Food Insecurity: No Food Insecurity   Worried About Charity fundraiser in the Last Year: Never true   Ran Out of Food in the Last Year: Never true  Housing: Low Risk    Last Housing Risk Score: 0  Physical  Activity: Inactive   Days of Exercise per Week: 0 days   Minutes of Exercise per Session: 0 min  Social Connections: Moderately Integrated   Frequency of Communication with Friends and Family: Twice a week   Frequency of Social Gatherings with Friends and Family: Twice a week   Attends Religious Services: More than 4 times per year   Active Member of [employer] or Organizations: No   Attends Music therapist: Never   Marital Status: Married  Stress: No Stress Concern Present   Feeling of Stress : Not at all  Tobacco Use: Low Risk    Smoking Tobacco Use: Never   Smokeless Tobacco Use: Never   Passive Exposure: Not on file  Transportation Needs: No Transportation Needs   Lack of Transportation (Medical): No   Lack of Transportation (Non-Medical): No    CCM Care Plan  Allergies  Allergen Reactions   Atenolol     Decreased blood pressure   Avelox [Moxifloxacin Hcl In Nacl]     Pt did not like side effects   Azithromycin     ?causes muscle weakness, dysphagia   Doxycycline Nausea Only    And heartburn   Myrbetriq [Mirabegron]     Increased blood pressure   Oxybutynin Palpitations    Per pt made her have rapid heart beat    Medications Reviewed Today     Reviewed by Tomasa Blase, [facility] (Pharmacist) on 07/25/21 at [place] List Status: <None>   Medication Order Taking? Sig Documenting Provider Last Dose Status Informant  Acetaminophen (TYLENOL ARTHRITIS PAIN PO) [id number] Yes Take 2 tablets by mouth 2 (two) times daily as needed. 654m tablets [provider] Taking Active Self  ALPRAZolam (XANAX) 0.25 MG tablet [id number] Take 1 tablet (0.25 mg total) by mouth daily as needed for anxiety. CHoyt Koch MD Taking Active   apixaban (ELIQUIS) 5 MG TABS tablet [id number] Take 1 tablet (5 mg total) by mouth 2 (two) times daily. CHoyt Koch MD Taking Active  budesonide-formoterol (SYMBICORT) 80-4.5 MCG/ACT inhaler [id number] No Take 2  puffs first thing in am and then another 2 puffs about 12 hours later.  Patient not taking: Reported on 07/25/2021   Tanda Rockers, MD Not Taking Active   chlorpheniramine (CHLOR-TRIMETON) 4 MG tablet [id number] Yes Take 4 mg by mouth in the morning and at bedtime. [provider] Taking Active   diltiazem (CARDIZEM CD) 240 MG 24 hr capsule [id number] Yes Take 1 capsule (240 mg total) by mouth daily. Werner Lean, MD Taking Active   pantoprazole (PROTONIX) 40 MG tablet [id number] Yes TAKE 1 TABLET (40 MG TOTAL) BY MOUTH DAILY. TAKE 30-60 MIN BEFORE FIRST MEAL OF THE DAY Tanda Rockers, MD Taking Active   rosuvastatin (CRESTOR) 20 MG tablet [id number] No Take 1 tablet (20 mg total) by mouth daily.  Patient not taking: Reported on 07/25/2021   Werner Lean, MD Not Taking Active   traZODone (DESYREL) 50 MG tablet [id number] Yes TAKE 0.5-1 TABLETS BY MOUTH AT BEDTIME AS NEEDED FOR SLEEP. Hoyt Koch, MD Taking Active   triamcinolone (NASACORT) 55 MCG/ACT AERO nasal inhaler [id number] Yes Place 2 sprays into the nose daily as needed (allergies). [provider] Taking Active Self            Patient Active Problem List   Diagnosis Date Noted   Upper airway cough syndrome vs cough variant asthma 04/20/2021   OSA (obstructive sleep apnea) 09/13/2020   Persistent atrial fibrillation ([place]) 07/08/2020   Aortic atherosclerosis ([place]) 06/30/2020   Routine general medical examination at a health care facility 04/02/2016   Other dysphagia 09/21/2013   Morbid obesity ([place]) 03/24/2013   DJD (degenerative joint disease) 03/24/2013   GERD (gastroesophageal reflux disease) 02/27/2012   Venous insufficiency 03/04/2010   Hyperlipidemia 01/21/2008   Essential hypertension 01/21/2008    Immunization History  Administered Date(s) Administered   Influenza Split 04/24/2011, 05/03/2012, 04/22/2013   Influenza, High Dose Seasonal PF 03/30/2015, 04/02/2016,  05/07/2017, 05/09/2018, 04/17/2019, 03/28/2021   Influenza,inj,Quad PF,6+ Mos 03/16/2014   Influenza-Unspecified 04/17/2019, 04/06/2020   PFIZER(Purple Top)SARS-COV-2 Vaccination 09/15/2019, 10/06/2019, 04/26/2020   Pneumococcal Conjugate-13 03/30/2015   Pneumococcal Polysaccharide-23 07/23/2004, 05/07/2017   Tdap 07/23/2004, 08/04/2015   Zoster Recombinat (Shingrix) 05/17/2017, 10/02/2017   Zoster, Live 05/23/2013    Conditions to be addressed/monitored:  Hypertension, Hyperlipidemia, Atrial Fibrillation, and GERD  Care Plan : [place]  Updates made by Tomasa Blase, RPH since 07/25/2021 12:00 AM     Problem: HTN, HLD, Afib, GERD   Priority: High  Onset Date: 07/25/2021     Long-Range Goal: Disease Management   Start Date: 07/25/2021  Expected End Date: 07/25/2022  This Visit's Progress: On track  Priority: High  Note:   Current Barriers:  Unable to independently monitor therapeutic efficacy  Pharmacist Clinical Goal(s):  Patient will achieve adherence to monitoring guidelines and medication adherence to achieve therapeutic efficacy through collaboration with PharmD and provider.   Interventions: 1:1 collaboration with Hoyt Koch, MD regarding development and update of comprehensive plan of care as evidenced by provider attestation and co-signature Inter-disciplinary care team collaboration (see longitudinal plan of care) Comprehensive medication review performed; medication list updated in electronic medical record  Hyperlipidemia: (LDL goal < 70) -Not ideally controlled Lab Results  Component Value Date   [place] 72 05/09/2021  -Current treatment: Rosuvastatin 68m - 1 tablet daily - not currently taking due to muscle aches / pains on this dose  -Medications previously tried: simvastatin  -  Current dietary patterns: reports to eating diet that is low in high cholesterol / fatty  foods  -Current exercise habits: nothing at this time  -Educated on  Cholesterol goals;  Benefits of statin for ASCVD risk reduction; Importance of limiting foods high in cholesterol; Exercise goal of 150 minutes per week; Strategies to manage statin-induced myalgias; -Counseled on diet and exercise extensively Recommended for retrial of crestor 73m daily - patient to reach out to discuss if unable to tolerate 129mdose - recheck lipid panel with next PCP appointment, if elevated from goal can consider switch to atorvastatin (with use of diltiazem, AE possibility is higher / could trial addition of ezetimibe)  Atrial Fibrillation (Goal: prevent stroke and major bleeding)/ Hypertension (BP goal <140/90) -Controlled -CHADS2VASc score: Age (1 point), Female sex (1 point), and Hypertension history (1 point) -Current treatment: Rate control: Diltiazem 24032m1 capsule daily  Anticoagulation: Eliquis 5mg55m1 tablet twice daily  -Medications previously tried: amlodipine, atenolol -Home BP and HR readings: reports BP at home averages 140/70's - in office lower - patient believes cuff at home is not accurate   -Counseled on increased risk of stroke due to Afib and benefits of anticoagulation for stroke prevention; importance of adherence to anticoagulant exactly as prescribed; bleeding risk associated with Eliquis and importance of self-monitoring for signs/symptoms of bleeding; avoidance of NSAIDs due to increased bleeding risk with anticoagulants; importance of regular laboratory monitoring; seeking medical attention after a head injury or if there is blood in the urine/stool; -Recommended to continue current medication  GERD (Goal: Acid control / prevention of acid reflux) -Controlled -Current treatment  Pantoprazole 40mg75m tablet daily  -Medications previously tried: omeprazole  -Counseled on diet and exercise extensively  Health Maintenance -Vaccine gaps: COVID booster  -Current therapy:  Chloramipheniramine 4mg -7mtablet twice daily  Triamcinolone  55mcg/42mnasal spray - 2 sprays into each nostril daily as needed  APAP 500mg - 63mblets twice daily as needed  Symbicort 80-4.5mcg/act56m2 puffs twice daily  Alprazolam 0.25mg - 1 36met daily as needed (patient reports to infrequent uses)  Trazodone 50mg - 1/238mablet nightly for sleep  -Patient is satisfied with current therapy and denies issues -Recommended to continue current medication  Patient Goals/Self-Care Activities Patient will:  - take medications as prescribed as evidenced by patient report and record review check blood pressure 2-3 times weekly, document, and provide at future appointments target a minimum of 150 minutes of moderate intensity exercise weekly engage in dietary modifications by reducing foods high in cholesterol / decreasing sodium intake   Follow Up Plan: Telephone follow up appointment with care management team member scheduled for: 5 months  The patient has been provided with contact information for the care management team and has been advised to call with any health related questions or concerns.         Medication Assistance: None required.  Patient affirms current coverage meets needs.  Compliance/Adherence/Medication fill history: Care Gaps: COVID Booster   Patient's preferred pharmacy is:  [id number] [id number], [place]:[phone]? No - plans to start using AM / PM pill boxes - has alarms set from PM medications  Pt endorses 100% compliance  Care Plan and Follow Up Patient Decision:  Patient agrees to Care Plan and Follow-up.  Plan: Telephone follow up appointment with care management team member scheduled for:  5 months  The patient has been provided with contact information for the care management team and has been advised to call with any health related questions or concerns.

## 2021-07-25 NOTE — Patient Instructions (Signed)
Visit Information   Following is a copy of your full care plan:  Care Plan : [place]  Updates made by Tomasa Blase, RPH since 07/25/2021 12:00 AM     Problem: HTN, HLD, Afib, GERD   Priority: High  Onset Date: 07/25/2021     Long-Range Goal: Disease Management   Start Date: 07/25/2021  Expected End Date: 07/25/2022  This Visit's Progress: On track  Priority: High  Note:   Current Barriers:  Unable to independently monitor therapeutic efficacy  Pharmacist Clinical Goal(s):  Patient will achieve adherence to monitoring guidelines and medication adherence to achieve therapeutic efficacy through collaboration with PharmD and provider.   Interventions: 1:1 collaboration with Amy Koch, MD regarding development and update of comprehensive plan of care as evidenced by provider attestation and co-signature Inter-disciplinary care team collaboration (see longitudinal plan of care) Comprehensive medication review performed; medication list updated in electronic medical record  Hyperlipidemia: (LDL goal < 70) -Not ideally controlled Lab Results  Component Value Date   [place] 72 05/09/2021  -Current treatment: Rosuvastatin 60m - 1 tablet daily - not currently taking due to muscle aches / pains on this dose  -Medications previously tried: simvastatin  -Current dietary patterns: reports to eating diet that is low in high cholesterol / fatty  foods  -Current exercise habits: nothing at this time  -Educated on Cholesterol goals;  Benefits of statin for ASCVD risk reduction; Importance of limiting foods high in cholesterol; Exercise goal of 150 minutes per week; Strategies to manage statin-induced myalgias; -Counseled on diet and exercise extensively Recommended for retrial of crestor 147mdaily - patient to reach out to discuss if unable to tolerate 1076mose - recheck lipid panel with next PCP appointment, if elevated from goal can consider switch to  atorvastatin (with use of diltiazem, AE possibility is higher / could trial addition of ezetimibe)  Atrial Fibrillation (Goal: prevent stroke and major bleeding)/ Hypertension (BP goal <140/90) -Controlled -CHADS2VASc score: Age (1 point), Female sex (1 point), and Hypertension history (1 point) -Current treatment: Rate control: Diltiazem 240m13m capsule daily  Anticoagulation: Eliquis 5mg 16m tablet twice daily  -Medications previously tried: amlodipine, atenolol -Home BP and HR readings: reports BP at home averages 140/70's - in office lower - patient believes cuff at home is not accurate   -Counseled on increased risk of stroke due to Afib and benefits of anticoagulation for stroke prevention; importance of adherence to anticoagulant exactly as prescribed; bleeding risk associated with Eliquis and importance of self-monitoring for signs/symptoms of bleeding; avoidance of NSAIDs due to increased bleeding risk with anticoagulants; importance of regular laboratory monitoring; seeking medical attention after a head injury or if there is blood in the urine/stool; -Recommended to continue current medication  GERD (Goal: Acid control / prevention of acid reflux) -Controlled -Current treatment  Pantoprazole 40mg 47mtablet daily  -Medications previously tried: omeprazole  -Counseled on diet and exercise extensively  Health Maintenance -Vaccine gaps: COVID booster  -Current therapy:  Chloramipheniramine 4mg - 63mablet twice daily  Triamcinolone 55mcg/a37masal spray - 2 sprays into each nostril daily as needed  APAP 500mg - 237mlets twice daily as needed  Symbicort 80-4.5mcg/act 31m puffs twice daily  Alprazolam 0.25mg - 1 t44mt daily as needed (patient reports to infrequent uses)  Trazodone 50mg - 1/2-10mblet nightly for sleep  -Patient is satisfied with current therapy and denies issues -Recommended to continue current medication  Patient Goals/Self-Care Activities Patient will:   -  take medications as prescribed as evidenced by patient report and record review check blood pressure 2-3 times weekly, document, and provide at future appointments target a minimum of 150 minutes of moderate intensity exercise weekly engage in dietary modifications by reducing foods high in cholesterol / decreasing sodium intake   Follow Up Plan: Telephone follow up appointment with care management team member scheduled for: 5 months  The patient has been provided with contact information for the care management team and has been advised to call with any health related questions or concerns.       Consent to CCM Services: Amy Williamson was given information about Chronic Care Management services including:  CCM service includes personalized support from designated clinical staff supervised by her physician, including individualized plan of care and coordination with other care providers 24/7 contact phone numbers for assistance for urgent and routine care needs. Service will only be billed when office clinical staff spend 20 minutes or more in a month to coordinate care. Only one practitioner may furnish and bill the service in a calendar month. The patient may stop CCM services at any time (effective at the end of the month) by phone call to the office staff. The patient will be responsible for cost sharing (co-pay) of up to 20% of the service fee (after annual deductible is met).  Patient agreed to services and verbal consent obtained.   Plan: Telephone follow up appointment with care management team member scheduled for:  5 months  The patient has been provided with contact information for the care management team and has been advised to call with any health related questions or concerns.   Tomasa Blase, PharmD Clinical Pharmacist, [facility]   Please call the care guide team at [phone] if you need to cancel or reschedule your appointment.   Patient verbalizes  understanding of instructions provided today and agrees to view in [place].

## 2021-07-26 ENCOUNTER — Telehealth: Payer: Self-pay

## 2021-07-26 ENCOUNTER — Ambulatory Visit: Payer: Medicare Other | Admitting: Internal Medicine

## 2021-07-26 ENCOUNTER — Encounter: Payer: Self-pay | Admitting: Internal Medicine

## 2021-07-26 DIAGNOSIS — R058 Other specified cough: Secondary | ICD-10-CM

## 2021-07-26 LAB — CBC WITH DIFFERENTIAL/PLATELET
Basophils Absolute: 0 10*3/uL (ref 0.0–0.1)
Basophils Relative: 0.5 % (ref 0.0–3.0)
Eosinophils Absolute: 0.2 10*3/uL (ref 0.0–0.7)
Eosinophils Relative: 2.1 % (ref 0.0–5.0)
HCT: 39.4 % (ref 36.0–46.0)
Hemoglobin: 12.8 g/dL (ref 12.0–15.0)
Lymphocytes Relative: 23.5 % (ref 12.0–46.0)
Lymphs Abs: 1.9 10*3/uL (ref 0.7–4.0)
MCHC: 32.5 g/dL (ref 30.0–36.0)
MCV: 84.2 fl (ref 78.0–100.0)
Monocytes Absolute: 0.6 10*3/uL (ref 0.1–1.0)
Monocytes Relative: 7.8 % (ref 3.0–12.0)
Neutro Abs: 5.5 10*3/uL (ref 1.4–7.7)
Neutrophils Relative %: 66.1 % (ref 43.0–77.0)
Platelets: 234 10*3/uL (ref 150.0–400.0)
RBC: 4.68 Mil/uL (ref 3.87–5.11)
RDW: 15.1 % (ref 11.5–15.5)
WBC: 8.3 10*3/uL (ref 4.0–10.5)

## 2021-07-26 MED ORDER — METHYLPREDNISOLONE ACETATE 80 MG/ML IJ SUSP
120.0000 mg | Freq: Once | INTRAMUSCULAR | Status: AC
  Administered 2021-07-26: 120 mg via INTRAMUSCULAR

## 2021-07-26 NOTE — Assessment & Plan Note (Signed)
Body mass index is 41.99 kg/m.   Lab Results  Component Value Date   TSH 2.22 06/30/2020      Contributing to doe and risk of worsening  GERD esp when coughing >>>   reviewed the need and the process to achieve and maintain neg calorie balance > defer f/u primary care including intermittently monitoring thyroid status             Each maintenance medication was reviewed in detail including emphasizing most importantly the difference between maintenance and prns and under what circumstances the prns are to be triggered using an action plan format where appropriate.  Total time for H and P, chart review, counseling, reviewing hfa device(s) and generating customized AVS unique to this office visit / same day charting  > 30 min

## 2021-07-26 NOTE — Assessment & Plan Note (Signed)
Onset on her 3s worse p started CPAP 12/2020 with nasal pillows  -  Sinus ct 04/20/2021 >>> done by Dr Toy Cookey and report requested  - 05/19/2021  After extensive coaching inhaler device,  effectiveness =    75% from baseline of 25%  -  Gabapentin trial 05/19/2021   -Allergy profile 07/26/2021 >  Eos 0.2 /  IgE  Pending obtained during flare of cough and subj wheeze   Clear evidence of wheeze on exam while reportedly on daily ppi ac  assoc with end exp wheeze/ cough typical of AB > restart symbicort and added depomedrol 120 mg IM/ add back noct pepcid due to am wheeze/cough  - The proper method of use, as well as anticipated side effects, of a metered-dose inhaler were discussed and demonstrated to the patient using teach back method. Improved effectiveness after extensive coaching during this visit to a level of approximately 75 % from a baseline of < 50 %   Advised also return with all meds in hand using a trust but verify approach to confirm accurate Medication  Reconciliation The principal here is that until we are certain that the  patients are doing what we've asked, it makes no sense to ask them to do more.

## 2021-07-26 NOTE — Patient Instructions (Addendum)
For drainage / throat tickle try take CHLORPHENIRAMINE  4 mg   take one every 4 hours as needed - available over the counter- may cause drowsiness so start with just a dose or two an hour before bedtime and see how you tolerate it before trying in daytime    Resume symbicort 80 Take 2 puffs first thing in am and then another  1 puffs  4 x daily   Add back pepcid 20 mg at bedtime   Depomedrol 120 mg IM   Please remember to go to the lab department   for your tests - we will call you with the results when they are available.      Please schedule a follow up office visit in 6 weeks, call sooner if needed with all medications /inhalers/ solutions in hand so we can verify exactly what you are taking. This includes all medications from all doctors and over the [place] separate them into two bags:  the ones you take automatically, no matter what, vs the ones you take just when you feel you need them "BAG #2 is UP TO YOU"  - this will really help [place] help you take your medications more effectively.   PFTs on return  - don't take any inhalers that am

## 2021-07-26 NOTE — Progress Notes (Signed)
error 

## 2021-07-26 NOTE — Progress Notes (Signed)
Amy Williamson, female    DOB: 31-Jul-1950     MRN: [id number]   Brief patient profile:  24 yowf never smoker referred to pulmonary clinic 04/20/2021 by Amy Williamson  for refractory cough/wheeze that pt dates back  to w/in a week or two of new start for OSA with cpap/ nasal pillows (can't tol full face mask)   Onset on her 43's sev times a year bad cough > dx bronchitis saw Amy Williamson  rx steroid shot/ abx 100% better sev times a year no def season and in 2015  (at wt 151) assoc with dysphagia better on PPI one hour ac daily and fine for the last year(since fall 2021) until sev weeks p started the CPAP   June 2022 and since then rx  amox/ prednisone which helped a lot Sept 2  2022 x 12 days but then w/in a week same problem.   History of Present Illness  04/20/2021  Pulmonary/ 1st office eval/Amy Williamson  Chief Complaint  Patient presents with   Pulmonary Consult    Referred by Fransico Him, MD. Pt c/o cough and wheezing x 6 wks- cough is prod with yellow sputum.  She states symptoms worse with exertion.   Dyspnea:  can no longer walk [employer] due to sob/ wheeze  Cough: ok while on cpap then starts up cough/ wheeze  in the am > mucus yellow again  Sleep: does fine on cpap thru the night lying flat/ nasal pillows  SABA use: last used 04/20/21 and not on day of ov Recent tooth infection L Lower molars  and persistent daytime nasal congestion despite abx  Rec Plan A = Automatic = Always=    Breztri 1 puff 1st thing in am and then 12 hour later  Work on inhaler technique:  Once you have used up the breztri or have mastered the technique change to symbicort 80 Take 2 puffs first thing in am and then another 2 puffs about 12 hours later.   Plan B = Backup (to supplement plan A, not to replace it) Only use your albuterol inhaler as a rescue medication Prednisone 10 mg take  4 each am x 2 days,   2 each am x 2 days,  1 each am x 2 days and stop  Pantoprazole (protonix) 40 mg (omeprazole 20 x 2)    Take   30-60 min before first meal of the day and Pepcid (famotidine)  20 mg after supper until return to office  GERD diet reviewed, bed blocks rec  Augmentin 875 mg take one pill twice daily  X 10 days - take at breakfast and supper with large glass of water.      We will call you to schedule sinus ct > not done  Please schedule a follow up office visit in 4 weeks, sooner if needed    05/19/2021  f/u ov/Amy Williamson re: uacs vs asthma   maint on symbicort 80  2 bid  Chief Complaint  Patient presents with   Follow-up    Cough and wheezing slightly improved. She is still coughing up yellow sputum in the am's. She is using her albuterol inhaler 2 x per wk on average.    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 She has not been using symbicort or albuterol. She has also had some wheezing.    Dyspnea:  Not limited by breathing from desired activities   Cough: worse in am/ not taking pepcid at hs/ not taking gapabentin due to nerves  Sleeping: flat bed  SABA use: none  02: none  Covid status:   vax x 3    No obvious day to day or daytime variability or assoc mucus plugs or hemoptysis or cp or chest tightness or overt  hb symptoms.   Sleeping  without nocturnal  or early am  exacerbation  of respiratory  c/o's or need for noct saba. Also denies any obvious fluctuation of symptoms with weather or environmental changes or other aggravating or alleviating factors except as outlined above   No unusual exposure hx or h/o childhood pna/ asthma or knowledge of premature birth.  Current Allergies, Complete Past Medical History, Past Surgical History, Family History, and Social History were reviewed in [employer] record.  ROS  The following are not active complaints unless bolded Hoarseness, sore throat, dysphagia, dental problems, itching, sneezing,  nasal congestion or discharge of excess mucus or purulent secretions, ear ache,   fever, chills, sweats, unintended wt loss or wt gain, classically pleuritic or exertional cp,  orthopnea pnd or arm/hand swelling  or leg swelling, presyncope, palpitations, abdominal pain, anorexia, nausea, vomiting, diarrhea  or change in bowel habits or change in bladder habits, change in stools or change in urine, dysuria, hematuria,  rash, arthralgias, visual complaints, headache, numbness, weakness or ataxia or problems with walking or coordination,  change in mood or  memory.        Current Meds  Medication Sig   Acetaminophen (TYLENOL ARTHRITIS PAIN PO) Take 2 tablets by mouth 2 (two) times daily as needed. 650mg  tablets   ALPRAZolam (XANAX) 0.25 MG tablet Take 1 tablet (0.25 mg total) by mouth daily as needed for anxiety.   apixaban (ELIQUIS) 5 MG TABS tablet Take 1 tablet (5 mg total) by mouth 2 (two) times daily.   chlorpheniramine (CHLOR-TRIMETON) 4 MG tablet Take 4 mg by mouth in the morning and at bedtime.   diltiazem (CARDIZEM CD) 240 MG 24 hr capsule Take 1 capsule (240 mg total) by mouth daily.   pantoprazole (PROTONIX) 40 MG tablet TAKE 1 TABLET (40 MG TOTAL) BY MOUTH DAILY. TAKE 30-60 MIN BEFORE FIRST MEAL OF THE DAY   rosuvastatin (CRESTOR) 20 MG tablet Take 1 tablet (20 mg total) by mouth daily.    traZODone (DESYREL) 50 MG tablet TAKE 0.5-1 TABLETS BY MOUTH AT BEDTIME AS NEEDED FOR SLEEP.   triamcinolone (NASACORT) 55 MCG/ACT AERO nasal inhaler Place 2 sprays into the nose daily as needed (allergies).         Past Medical History:  Diagnosis Date  Anticoagulant long-term use    eliquis --- managed by cardiology   Arthritis    BACK AND SHOULDERS   Depressive disorder, not elsewhere classified    Essential hypertension    followed by pcp and cardiology   Frequency of urination    GERD (gastroesophageal reflux disease)    History of dysphagia 2015   s/p egd w/ dilation, pt did not have stricture   History of gastritis    OSA (obstructive sleep apnea)    followed by Amy t. turner---  study in epic 09-03-2020 moderate osa ,  pt scheduled for cpap titration 12-08-2020   PAF (paroxysmal atrial fibrillation) [facility])    cardiologist-- Amy Jerilynn Mages. Gasper Sells---- new onset 06-30-2020 at pcp office visit,  s/p DCCV 07-28-2020 successful   Palpitations 06/2020   11-16-2020  per pt denies palpitations since DCCV 07-28-2020   Plantar fasciitis 08/2017   left foot    PMB (postmenopausal bleeding)    Pure hypercholesterolemia         Objective:      07/26/2021         215   05/15/21 216 lb 6.4 oz (98.2 kg)  04/20/21 212 lb 9.6 oz (96.4 kg)  03/31/21 216 lb 12.8 oz (98.3 kg)    Vital signs reviewed  07/26/2021  - Note at rest 02 sats  97% on RA   General appearance:    amb mod obese wf/ congested cough     HEENT : pt wearing mask not removed for exam due to covid -19 concerns.    NECK :  without JVD/Nodes/TM/ nl carotid upstrokes bilaterally   LUNGS: no acc muscle use,  Nl contour chest end exp wheeze  bilaterally with assoc  cough  exp maneuvers   CV:  RRR  no s3 or murmur or increase in P2, and no edema   ABD:  soft and nontender with nl inspiratory excursion in the supine position. No bruits or organomegaly appreciated, bowel sounds nl  MS:  Nl gait/ ext warm without  deformities, calf tenderness, cyanosis or clubbing No obvious joint restrictions   SKIN: warm and dry without lesions    NEURO:  alert, approp, nl sensorium with  no motor or cerebellar deficits apparent.     Labs ordered 07/26/2021  :  allergy profile             Assessment

## 2021-07-27 LAB — IGE: IgE (Immunoglobulin E), Serum: 9 kU/L (ref <=114)

## 2021-07-27 NOTE — Telephone Encounter (Signed)
Reviewed at [facility] 07/26/21

## 2021-08-22 DIAGNOSIS — I10 Essential (primary) hypertension: Secondary | ICD-10-CM | POA: Diagnosis not present

## 2021-08-22 DIAGNOSIS — I4819 Other persistent atrial fibrillation: Secondary | ICD-10-CM | POA: Diagnosis not present

## 2021-09-05 ENCOUNTER — Encounter (HOSPITAL_COMMUNITY): Payer: Self-pay

## 2021-09-15 ENCOUNTER — Other Ambulatory Visit: Payer: Self-pay | Admitting: *Deleted

## 2021-09-15 DIAGNOSIS — R058 Other specified cough: Secondary | ICD-10-CM

## 2021-09-18 ENCOUNTER — Ambulatory Visit (INDEPENDENT_AMBULATORY_CARE_PROVIDER_SITE_OTHER): Payer: Medicare Other | Admitting: Internal Medicine

## 2021-09-18 ENCOUNTER — Encounter: Payer: Self-pay | Admitting: Internal Medicine

## 2021-09-18 ENCOUNTER — Ambulatory Visit: Payer: Medicare Other | Admitting: Internal Medicine

## 2021-09-18 ENCOUNTER — Other Ambulatory Visit: Payer: Self-pay

## 2021-09-18 DIAGNOSIS — R058 Other specified cough: Secondary | ICD-10-CM

## 2021-09-18 LAB — PULMONARY FUNCTION TEST
DL/VA % pred: 137 %
DL/VA: 5.86 ml/min/mmHg/L
DLCO cor % pred: 108 %
DLCO cor: 18.34 ml/min/mmHg
DLCO unc % pred: 106 %
DLCO unc: 18 ml/min/mmHg
FEF 25-75 Post: 2.14 L/sec
FEF 25-75 Pre: 1.89 L/sec
FEF2575-%Change-Post: 13 %
FEF2575-%Pred-Post: 131 %
FEF2575-%Pred-Pre: 116 %
FEV1-%Change-Post: 0 %
FEV1-%Pred-Post: 79 %
FEV1-%Pred-Pre: 79 %
FEV1-Post: 1.49 L
FEV1-Pre: 1.48 L
FEV1FVC-%Change-Post: 3 %
FEV1FVC-%Pred-Pre: 116 %
FEV6-%Change-Post: -2 %
FEV6-%Pred-Post: 68 %
FEV6-%Pred-Pre: 70 %
FEV6-Post: 1.63 L
FEV6-Pre: 1.68 L
FEV6FVC-%Pred-Post: 105 %
FEV6FVC-%Pred-Pre: 105 %
FVC-%Change-Post: -2 %
FVC-%Pred-Post: 65 %
FVC-%Pred-Pre: 67 %
FVC-Post: 1.63 L
FVC-Pre: 1.68 L
Post FEV1/FVC ratio: 91 %
Post FEV6/FVC ratio: 100 %
Pre FEV1/FVC ratio: 88 %
Pre FEV6/FVC Ratio: 100 %
RV % pred: 78 %
RV: 1.58 L
TLC % pred: 81 %
TLC: 3.61 L

## 2021-09-18 NOTE — Assessment & Plan Note (Signed)
Body mass index is 42.58 kg/m.  -  Trending up still with ERV 30% on pfts 09/18/2021  Lab Results  Component Value Date   TSH 2.22 06/30/2020      Contributing to doe and risk of GERD >>>   reviewed the need and the process to achieve and maintain neg calorie balance > defer f/u primary care including intermittently monitoring thyroid status            Each maintenance medication was reviewed in detail including emphasizing most importantly the difference between maintenance and prns and under what circumstances the prns are to be triggered using an action plan format where appropriate.  Total time for H and P, chart review, counseling, reviewing hfa  device(s) and generating customized AVS unique to this office visit / same day charting = 26 min

## 2021-09-18 NOTE — Progress Notes (Signed)
Amy Williamson, female    DOB: 12-15-1950     MRN: [id number]   Brief patient profile:  40 yowf never smoker referred to pulmonary clinic 04/20/2021 by Dr Fransico Him  for refractory cough/wheeze that pt dates back  to w/in a week or two of new start for OSA with cpap/ nasal pillows (can't tol full face mask)   Onset on her 84's sev times a year bad cough > dx bronchitis saw Lenna Gilford  rx steroid shot/ abx 100% better sev times a year no def season and in 2015  (at wt 151) assoc with dysphagia better on PPI one hour ac daily and fine for the last year(since fall 2021) until sev weeks p started the CPAP   June 2022 and since then rx  amox/ prednisone which helped a lot Sept 2  2022 x 12 days but then w/in a week same problem.   History of Present Illness  04/20/2021  Pulmonary/ 1st office eval/Amy Williamson  Chief Complaint  Patient presents with   Pulmonary Consult    Referred by Fransico Him, MD. Pt c/o cough and wheezing x 6 wks- cough is prod with yellow sputum.  She states symptoms worse with exertion.   Dyspnea:  can no longer walk [employer] due to sob/ wheeze  Cough: ok while on cpap then starts up cough/ wheeze  in the am > mucus yellow again  Sleep: does fine on cpap thru the night lying flat/ nasal pillows  SABA use: last used 04/20/21 and not on day of ov Recent tooth infection L Lower molars  and persistent daytime nasal congestion despite abx  Rec Plan A = Automatic = Always=    Breztri 1 puff 1st thing in am and then 12 hour later  Work on inhaler technique:  Once you have used up the breztri or have mastered the technique change to symbicort 80 Take 2 puffs first thing in am and then another 2 puffs about 12 hours later.   Plan B = Backup (to supplement plan A, not to replace it) Only use your albuterol inhaler as a rescue medication Prednisone 10 mg take  4 each am x 2 days,   2 each am x 2 days,  1 each am x 2 days and stop  Pantoprazole (protonix) 40 mg (omeprazole 20 x 2)    Take   30-60 min before first meal of the day and Pepcid (famotidine)  20 mg after supper until return to office  GERD diet reviewed, bed blocks rec  Augmentin 875 mg take one pill twice daily  X 10 days - take at breakfast and supper with large glass of water.      We will call you to schedule sinus ct > not done  Please schedule a follow up office visit in 4 weeks, sooner if needed    05/19/2021  f/u ov/Amy Williamson re: uacs vs asthma   maint on symbicort 80  2 bid  Chief Complaint  Patient presents with   Follow-up    Cough and wheezing slightly improved. She is still coughing up yellow sputum in the am's. She is using her albuterol inhaler 2 x per wk on average.    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 She has not been using symbicort or albuterol. She has also had some wheezing.    Dyspnea:  Not limited by breathing from desired activities   Cough: worse in am/ not taking pepcid at hs/ not taking gapabentin due to nerves  Sleeping: flat bed  SABA use: none  02: none  Covid status:   vax x 3  Rec For drainage / throat tickle try take CHLORPHENIRAMINE  4 mg   take one every 4 hours as needed   Resume symbicort 80 Take 2 puffs first thing in am and then another  1  puffs  4 x daily  Add back pepcid 20 mg at bedtime  Depomedrol 120 mg IM  Please remember to go to the lab department   Allergy screen 07/26/2021 >  Eos 0.2 /  IgE 9 Please schedule a follow up office visit in 6 weeks, call sooner if needed with all medications /inhalers/ solutions in hand      09/18/2021  f/u ov/Amy Williamson re: uacs vs asthma   maint on symbicort 80 2bid and 1st gen H1 blockers   Chief Complaint  Patient presents with   Follow-up    PFT's done today.  She states cough is slightly better. She still has prod cough with light yellow sputum in the am's. She has some wheezing at night and when she gets outside in the cold weather.   Dyspnea:  limited by back / hips > breathing  Cough: cold air still makes her cough even on symbicort Sleeping: adjusted to 10 degrees HOB up on  electric bed  SABA use: not usng  02: none      No obvious day to day or daytime variability or assoc excess/ purulent sputum or mucus plugs or hemoptysis or cp or chest tightness, subjective wheeze or overt sinus or hb symptoms.   Sleeping  without nocturnal  or early am exacerbation  of respiratory  c/o's or need for noct saba. Also denies any obvious fluctuation of symptoms with weather or environmental changes or other aggravating or alleviating factors except as outlined above   No unusual exposure hx or h/o childhood pna/ asthma or knowledge of premature birth.  Current Allergies, Complete Past Medical History, Past Surgical History, Family History, and Social History were reviewed in [employer] record.  ROS  The following are not active complaints unless bolded Hoarseness, sore throat, dysphagia, dental problems, itching, sneezing,  nasal congestion or discharge of excess mucus or purulent secretions, ear ache,   fever, chills, sweats, unintended wt loss or wt gain, classically pleuritic or exertional cp,  orthopnea pnd or arm/hand swelling  or leg swelling, presyncope,  palpitations, abdominal pain, anorexia, nausea, vomiting, diarrhea  or change in bowel habits or change in bladder habits, change in stools or change in urine,  dysuria, hematuria,  rash, arthralgias, visual complaints, headache, numbness, weakness or ataxia or problems with walking or coordination,  change in mood or  memory.        Current Meds  Medication Sig   Acetaminophen (TYLENOL ARTHRITIS PAIN PO) Take 2 tablets by mouth 2 (two) times daily as needed. 650mg  tablets   apixaban (ELIQUIS) 5 MG TABS tablet Take 1 tablet (5 mg total) by mouth 2 (two) times daily.   budesonide-formoterol (SYMBICORT) 80-4.5 MCG/ACT inhaler Take 2 puffs first thing in am and then another 2 puffs about 12 hours later.   chlorpheniramine (CHLOR-TRIMETON) 4 MG tablet Take 4 mg by mouth in the morning and at bedtime.   diltiazem (CARDIZEM CD) 240 MG 24 hr capsule Take 1 capsule (240 mg total) by mouth daily.   fluticasone (FLONASE) 50 MCG/ACT nasal spray Place 2 sprays into both nostrils daily as needed for allergies or rhinitis.   pantoprazole (PROTONIX) 40 MG tablet TAKE 1 TABLET (40 MG TOTAL) BY MOUTH DAILY. TAKE 30-60 MIN BEFORE FIRST MEAL OF THE DAY   rosuvastatin (CRESTOR) 20 MG tablet Take 1 tablet (20 mg total) by mouth daily.   traZODone (DESYREL) 50 MG tablet TAKE 0.5-1 TABLETS BY MOUTH AT BEDTIME AS NEEDED FOR SLEEP.            Past Medical History:  Diagnosis Date   Anticoagulant long-term use    eliquis --- managed by cardiology   Arthritis    BACK AND SHOULDERS   Depressive disorder, not elsewhere classified    Essential hypertension    followed by pcp and cardiology   Frequency of urination    GERD (gastroesophageal reflux disease)    History of dysphagia 2015   s/p egd w/ dilation, pt did not have stricture   History of gastritis    OSA (obstructive sleep apnea)    followed by dr t. turner---  study in epic 09-03-2020 moderate osa ,  pt scheduled for cpap titration 12-08-2020   PAF  (paroxysmal atrial fibrillation) [facility])    cardiologist-- [name]---- new onset 06-30-2020 at pcp office visit,  s/p DCCV 07-28-2020 successful   Palpitations 06/2020   11-16-2020  per pt denies palpitations since DCCV 07-28-2020   Plantar fasciitis 08/2017   left foot    PMB (postmenopausal bleeding)    Pure hypercholesterolemia         Objective:    Wts  09/18/2021        218  07/26/2021         215   05/15/21 216 lb 6.4 oz (98.2 kg)  04/20/21 212 lb 9.6 oz (96.4 kg)  03/31/21 216 lb 12.8 oz (98.3 kg)     Vital signs reviewed  09/18/2021  - Note at rest 02 sats  98% on RA   General appearance:    amb obesw wm nad    HEENT : pt wearing mask not removed for exam due to covid -19 concerns.    NECK :  without JVD/Nodes/TM/ nl carotid upstrokes bilaterally   LUNGS: no acc muscle use,  Nl contour chest which is clear to A and P bilaterally without cough on insp or exp maneuvers   CV:  RRR  no s3 or murmur or increase in P2, and no edema   ABD:  soft and nontender with nl inspiratory excursion in the supine position. No bruits or organomegaly appreciated, bowel sounds nl  MS:  Nl gait/ ext warm without deformities, calf tenderness, cyanosis  or clubbing No obvious joint restrictions   SKIN: warm and dry without lesions    NEURO:  alert, approp, nl sensorium with  no motor or cerebellar deficits apparent.           Assessment

## 2021-09-18 NOTE — Patient Instructions (Signed)
Full PFT performed today. °

## 2021-09-18 NOTE — Progress Notes (Signed)
Full PFT performed today. °

## 2021-09-18 NOTE — Patient Instructions (Addendum)
Stop chlorpheniramine then a week later try off symbicort at night just take it first thing ing am    Ok to go back omeprazole after a month but continue pepcid 20 mg after supper   Work on your weight, it's the main problem you are having with you are having with your breathing   Pulmonary follow up is as needed

## 2021-09-18 NOTE — Assessment & Plan Note (Signed)
Onset on her 40s worse p started CPAP 12/2020 with nasal pillows  -  Sinus CT 05/08/21 mild thickening ethmoids only   - 05/19/2021  After extensive coaching inhaler device,  effectiveness =    75% from baseline of 25%  -  Gabapentin trial 05/19/2021  -Allergy screen 07/26/2021 >  Eos 0.2 /  IgE 9    - PFT's  1.49  FEV1 79 (91 % ) ratio 0.91  p 0 % improvement from saba p saba prior to study with DLCO  18.00 (106%) corrects to 5.86 (136%)  for alv volume and FV curve nl  and ERV 30 at wt 218  - 09/18/2021 ok to try off symbicort and h1 in a step wise fashion to see what difference if any these make   Strongly suspect the cough is entirely upper airway (? Jerrye Bushy) and the doe is related to obesity with neither related to asthma so will try a reverse therapeutic trial as above and f/u prn flare

## 2021-09-26 ENCOUNTER — Emergency Department (HOSPITAL_COMMUNITY)
Admission: EM | Admit: 2021-09-26 | Discharge: 2021-09-26 | Disposition: A | Discharge status: Home / Self Care | Payer: Medicare Other | Attending: Emergency Medicine | Admitting: Emergency Medicine

## 2021-09-26 ENCOUNTER — Telehealth: Payer: Self-pay | Admitting: Internal Medicine

## 2021-09-26 ENCOUNTER — Telehealth (HOSPITAL_COMMUNITY): Payer: Self-pay

## 2021-09-26 ENCOUNTER — Emergency Department (HOSPITAL_COMMUNITY): Payer: Medicare Other

## 2021-09-26 ENCOUNTER — Encounter (HOSPITAL_COMMUNITY): Payer: Self-pay

## 2021-09-26 ENCOUNTER — Other Ambulatory Visit: Payer: Self-pay

## 2021-09-26 DIAGNOSIS — I480 Paroxysmal atrial fibrillation: Secondary | ICD-10-CM | POA: Diagnosis not present

## 2021-09-26 DIAGNOSIS — R002 Palpitations: Secondary | ICD-10-CM | POA: Diagnosis present

## 2021-09-26 DIAGNOSIS — I4891 Unspecified atrial fibrillation: Secondary | ICD-10-CM

## 2021-09-26 DIAGNOSIS — Z7901 Long term (current) use of anticoagulants: Secondary | ICD-10-CM | POA: Insufficient documentation

## 2021-09-26 DIAGNOSIS — Z79899 Other long term (current) drug therapy: Secondary | ICD-10-CM | POA: Diagnosis not present

## 2021-09-26 DIAGNOSIS — Z0189 Encounter for other specified special examinations: Secondary | ICD-10-CM

## 2021-09-26 DIAGNOSIS — Z4502 Encounter for adjustment and management of automatic implantable cardiac defibrillator: Secondary | ICD-10-CM | POA: Insufficient documentation

## 2021-09-26 LAB — CBC
HCT: 41.9 % (ref 36.0–46.0)
Hemoglobin: 14.1 g/dL (ref 12.0–15.0)
MCH: 28.4 pg (ref 26.0–34.0)
MCHC: 33.7 g/dL (ref 30.0–36.0)
MCV: 84.5 fL (ref 80.0–100.0)
Platelets: 264 10*3/uL (ref 150–400)
RBC: 4.96 MIL/uL (ref 3.87–5.11)
RDW: 15.3 % (ref 11.5–15.5)
WBC: 14.4 10*3/uL — ABNORMAL HIGH (ref 4.0–10.5)
nRBC: 0 % (ref 0.0–0.2)

## 2021-09-26 LAB — BASIC METABOLIC PANEL
Anion gap: 7 (ref 5–15)
BUN: 24 mg/dL — ABNORMAL HIGH (ref 8–23)
CO2: 26 mmol/L (ref 22–32)
Calcium: 9.5 mg/dL (ref 8.9–10.3)
Chloride: 104 mmol/L (ref 98–111)
Creatinine, Ser: 0.74 mg/dL (ref 0.44–1.00)
GFR, Estimated: >60 mL/min (ref >60)
Glucose, Bld: 109 mg/dL — ABNORMAL HIGH (ref 70–99)
Potassium: 3.8 mmol/L (ref 3.5–5.1)
Sodium: 137 mmol/L (ref 135–145)

## 2021-09-26 LAB — MAGNESIUM: Magnesium: 2.6 mg/dL — ABNORMAL HIGH (ref 1.7–2.4)

## 2021-09-26 LAB — BRAIN NATRIURETIC PEPTIDE: B Natriuretic Peptide: 200.5 pg/mL — ABNORMAL HIGH (ref 0.0–100.0)

## 2021-09-26 MED ORDER — ETOMIDATE 2 MG/ML IV SOLN
10.0000 mg | Freq: Once | INTRAVENOUS | Status: AC
  Administered 2021-09-26: 10 mg via INTRAVENOUS
  Filled 2021-09-26: qty 10

## 2021-09-26 MED ORDER — ETOMIDATE 2 MG/ML IV SOLN
INTRAVENOUS | Status: AC | PRN
  Administered 2021-09-26: 10 mg via INTRAVENOUS

## 2021-09-26 MED ORDER — METOPROLOL TARTRATE 5 MG/5ML IV SOLN
5.0000 mg | Freq: Once | INTRAVENOUS | Status: AC
  Administered 2021-09-26: 5 mg via INTRAVENOUS
  Filled 2021-09-26: qty 5

## 2021-09-26 MED ORDER — SODIUM CHLORIDE 0.9 % IV BOLUS
500.0000 mL | Freq: Once | INTRAVENOUS | Status: AC
  Administered 2021-09-26: 500 mL via INTRAVENOUS

## 2021-09-26 NOTE — ED Provider Notes (Signed)
[place] [facility] DEPT Provider Note   CSN: [id number] Arrival date & time: 09/26/21  1037     History  Chief Complaint  Patient presents with   Atrial Fibrillation   Palpitations    Amy Williamson is a 71 y.o. female with a history of OSA, paroxysmal A-fib on diltiazem 240 daily, Eliquis 5 mg twice daily, presented ED with palpitations.  Patient ports that she woke up from sleep earlier this morning around 4 AM with palpitations which she says feels like her prior A-fib episodes.  She was most recently cardioverted in January of this year per her report.  She reports she did take all of her medications this morning including her Eliquis and her diltiazem.  She last had breakfast around 7:00 with a bowl of cereal and a small amount of coffee.  She denies any recent cold, congestion, fevers, chills.  She denies history of MI or coronary disease.  HPI     Home Medications Prior to Admission medications   Medication Sig Start Date End Date Taking? Authorizing Provider  Acetaminophen (TYLENOL ARTHRITIS PAIN PO) Take 2 tablets by mouth 2 (two) times daily as needed. '650mg'$  tablets    [provider]  apixaban (ELIQUIS) 5 MG TABS tablet Take 1 tablet (5 mg total) by mouth 2 (two) times daily. 02/13/21   Hoyt Koch, MD  budesonide-formoterol [facility]) 80-4.5 MCG/ACT inhaler Take 2 puffs first thing in am and then another 2 puffs about 12 hours later. 04/20/21   Tanda Rockers, MD  diltiazem (CARDIZEM CD) 240 MG 24 hr capsule Take 1 capsule (240 mg total) by mouth daily. 12/14/20   Chandrasekhar, Terisa Starr, MD  fluticasone (FLONASE) 50 MCG/ACT nasal spray Place 2 sprays into both nostrils daily as needed for allergies or rhinitis.    [provider]  pantoprazole (PROTONIX) 40 MG tablet TAKE 1 TABLET (40 MG TOTAL) BY MOUTH DAILY. TAKE 30-60 MIN BEFORE FIRST MEAL OF THE DAY 07/05/21   Tanda Rockers, MD  rosuvastatin (CRESTOR) 20 MG tablet  Take 1 tablet (20 mg total) by mouth daily. 02/06/21   Werner Lean, MD  traZODone (DESYREL) 50 MG tablet TAKE 0.5-1 TABLETS BY MOUTH AT BEDTIME AS NEEDED FOR SLEEP. 04/19/21   Hoyt Koch, MD      Allergies    Atenolol, Avelox [moxifloxacin hcl in nacl], Azithromycin, Doxycycline, Myrbetriq [mirabegron], and Oxybutynin    Review of Systems   Review of Systems  Physical Exam Updated Vital Signs BP (!) 173/87 (BP Location: Left Arm)    Pulse 60    Temp 98.2 F (36.8 C) (Oral)    Resp 16    Ht 5' (1.524 m)    Wt 95.7 kg    LMP 07/23/2004    SpO2 98%    BMI 41.21 kg/m  Physical Exam Constitutional:      General: She is not in acute distress. HENT:     Head: Normocephalic and atraumatic.  Eyes:     Conjunctiva/sclera: Conjunctivae normal.     Pupils: Pupils are equal, round, and reactive to light.  Cardiovascular:     Rate and Rhythm: Tachycardia present. Rhythm irregular.  Pulmonary:     Effort: Pulmonary effort is normal. No respiratory distress.  Abdominal:     General: There is no distension.     Tenderness: There is no abdominal tenderness.  Skin:    General: Skin is warm and dry.  Neurological:     General:  No focal deficit present.     Mental Status: She is alert. Mental status is at baseline.  Psychiatric:        Mood and Affect: Mood normal.        Behavior: Behavior normal.    ED Results / Procedures / Treatments   Labs (all labs ordered are listed, but only abnormal results are displayed) Labs Reviewed  BASIC METABOLIC PANEL - Abnormal; Notable for the following components:      Result Value   Glucose, Bld 109 (*)    BUN 24 (*)    All other components within normal limits  CBC - Abnormal; Notable for the following components:   WBC 14.4 (*)    All other components within normal limits  MAGNESIUM - Abnormal; Notable for the following components:   Magnesium 2.6 (*)    All other components within normal limits  BRAIN NATRIURETIC PEPTIDE -  Abnormal; Notable for the following components:   B Natriuretic Peptide 200.5 (*)    All other components within normal limits    EKG EKG Interpretation  Date/Time:  Tuesday September 26 2021 10:44:55 EST Ventricular Rate:  106 PR Interval:    QRS Duration: 80 QT Interval:  305 QTC Calculation: 405 R Axis:   40 Text Interpretation: Atrial fibrillation Minimal ST depression, lateral leads Confirmed by Octaviano Glow [phone]) on 09/26/2021 12:08:06 PM  Radiology DG Chest 2 View  Result Date: 09/26/2021 CLINICAL DATA:  Atrial fibrillation.  Chest pain. EXAM: CHEST - 2 VIEW COMPARISON:  03/23/2021 FINDINGS: Slightly prominent central vascular structures are stable. Heart size is upper limits of normal and stable. No focal airspace disease or overt pulmonary edema. No large pleural effusions. Atherosclerotic calcifications at the aortic arch. Negative for a pneumothorax. No acute bone abnormality. IMPRESSION: No active cardiopulmonary disease. Electronically Signed   By: Markus Daft M.D.   On: 09/26/2021 11:52    Procedures .Critical Care Performed by: Wyvonnia Dusky, MD Authorized by: Wyvonnia Dusky, MD   Critical care provider statement:    Critical care time (minutes):  45   Critical care time was exclusive of:  Separately billable procedures and treating other patients   Critical care was necessary to treat or prevent imminent or life-threatening deterioration of the following conditions:  Circulatory failure   Critical care was time spent personally by me on the following activities:  Ordering and performing treatments and interventions, ordering and review of laboratory studies, ordering and review of radiographic studies, pulse oximetry, review of old charts, examination of patient and evaluation of patient's response to treatment Comments:     Rate control, repeat EKG, repeat telemetry assessment for A Fib with RVR .Cardioversion  Date/Time: 09/26/2021 2:07 PM Performed by:  Wyvonnia Dusky, MD Authorized by: Wyvonnia Dusky, MD   Consent:    Consent obtained:  Written   Consent given by:  Patient   Risks discussed:  Death, pain, induced arrhythmia and cutaneous burn   Alternatives discussed:  Rate-control medication, alternative treatment, no treatment and delayed treatment Universal protocol:    Procedure explained and questions answered to patient or proxy's satisfaction: yes     Relevant documents present and verified: yes     Test results available and properly labeled: yes     Imaging studies available: yes     Required blood products, implants, devices, and special equipment available: yes     Site/side marked: yes     Immediately prior to procedure a time out  was called: yes     Patient identity confirmed:  Arm band and verbally with patient Pre-procedure details:    Cardioversion basis:  Elective   Rhythm:  Atrial fibrillation   Electrode placement:  Anterior-posterior Patient sedated: Yes. Refer to sedation procedure documentation for details of sedation.  Attempt one:    Cardioversion mode:  Synchronous   Waveform:  Biphasic   Shock (Joules):  150   Shock outcome:  Conversion to normal sinus rhythm Post-procedure details:    Patient status:  Awake   Patient tolerance of procedure:  Tolerated well, no immediate complications .Sedation  Date/Time: 09/26/2021 2:08 PM Performed by: Wyvonnia Dusky, MD Authorized by: Wyvonnia Dusky, MD   Consent:    Consent obtained:  Written and verbal   Consent given by:  Patient   Risks discussed:  Allergic reaction, dysrhythmia, prolonged hypoxia resulting in organ damage, prolonged sedation necessitating reversal, respiratory compromise necessitating ventilatory assistance and intubation, vomiting, inadequate sedation and nausea Universal protocol:    Procedure explained and questions answered to patient or proxy's satisfaction: yes     Immediately prior to procedure, a time out was called:  yes     Patient identity confirmed:  Arm band and verbally with patient Indications:    Procedure performed:  Cardioversion   Procedure necessitating sedation performed by:  Physician performing sedation Pre-sedation assessment:    Time since last food or drink:  4 hour   ASA classification: class 2 - patient with mild systemic disease     Mallampati score:  II - soft palate, uvula, fauces visible   Pre-sedation assessments completed and reviewed: airway patency, cardiovascular function (4 hour), hydration status, mental status, nausea/vomiting, pain level, respiratory function and temperature   Immediate pre-procedure details:    Reassessment: Patient reassessed immediately prior to procedure     Reviewed: vital signs     Verified: bag valve mask available, emergency equipment available, intubation equipment available, IV patency confirmed, oxygen available and reversal medications available   Procedure details (see MAR for exact dosages):    Preoxygenation:  Nasal cannula   Sedation:  Etomidate   Intended level of sedation: deep   Analgesia:  None   Intra-procedure monitoring:  Blood pressure monitoring, cardiac monitor, continuous capnometry, continuous pulse oximetry, frequent LOC assessments and frequent vital sign checks   Intra-procedure events: none     Total Provider sedation time (minutes):  35 Post-procedure details:    Post-sedation assessment completed:  09/26/2021 2:09 PM   Attendance: Constant attendance by certified staff until patient recovered     Recovery: Patient returned to pre-procedure baseline     Post-sedation assessments completed and reviewed: airway patency, cardiovascular function, hydration status, mental status, nausea/vomiting, pain level, respiratory function and temperature     Patient is stable for discharge or admission: yes     Procedure completion:  Tolerated well, no immediate complications    Medications Ordered in ED Medications  metoprolol  tartrate (LOPRESSOR) injection 5 mg (5 mg Intravenous Given 09/26/21 1134)  sodium chloride 0.9 % bolus 500 mL (0 mLs Intravenous Stopped 09/26/21 1230)  etomidate (AMIDATE) injection 10 mg (10 mg Intravenous Given 09/26/21 1259)  etomidate (AMIDATE) injection (10 mg Intravenous Given 09/26/21 1259)    ED Course/ Medical Decision Making/ A&P Clinical Course as of 09/26/21 1409  Tue Sep 26, 2021  1112 ECG per my interpretation shows A Fib HR 106, no acute ischemic findings [MT]  1120 Tele hR 110-130 bpm [MT]  1151 Potassium:  3.8 [MT]  1151 Magnesium(!): 2.6 [MT]  1213 Remains in A Fib, HR 80's. [MT]  1213 Discussed risks and benefits of cardioversion with patient and her husband, including failed cardioversion, burns, chest pain, induced arrhythmia, stroke and death.  Alternative options discussed including rate control w/ metoprolol and outpatient cardiology follow up.  They would prefer to proceed with cardioversion. [MT]  1314 Successfully cardioverted back to NSR, awake from sedation [MT]  1405 Patient reassessed, fully awake and asymptomatic, okay for discharge with cardiology follow-up [MT]  1405 Pt also reporting steroid injection 3 days ago for hip pain - likely the cause of her leukocytosis.  I do not see signs of infection/sepsis otherwise. [MT]    Clinical Course User Index [MT] Kaliya Shreiner, Carola Rhine, MD                           Medical Decision Making Amount and/or Complexity of Data Reviewed Labs: ordered. Decision-making details documented in ED Course. Radiology: ordered.  Risk Prescription drug management.   Patient is here in A-fib with RVR, having a paroxysmal episode.  She has been compliant with her Eliquis for several months.  I discussed with her the risk and benefits of cardioversion, and she would prefer to proceed at this time, which I think is reasonable given her anticoagulation status.  She is symptomatic with her heart rate approaching the 130s at times.  We did try  IV metoprolol initially and were not able to achieve cardioversion with that.  I reviewed her external records including her cardiology office evaluation  I personally reviewed and interpreted patient's labs and imaging, notable for no PNA, WBC elevated (pt had steroid injection recently).    Repeat ECG's interpreted - initially A Fib with RVR, subsequently NSR after cardioversion  Husband at bedside provided supplemental hx          Final Clinical Impression(s) / ED Diagnoses Final diagnoses:  Atrial fibrillation with RVR ([place])  Encounter for cardioversion procedure    Rx / DC Orders ED Discharge Orders     None         Wyvonnia Dusky, MD 09/26/21 1410

## 2021-09-26 NOTE — Telephone Encounter (Signed)
STAT if HR is under 50 or over 120 ?(normal HR is 60-100 beats per minute) ? ?What is your heart rate? 112 just sitting still ? ?Do you have a log of your heart rate readings (document readings)? Goes up to 150 when she gets up and moves around ? ?Do you have any other symptoms? Chest tightness, face is flushed ? ? ?Pt c/o of Chest Pain: STAT if CP now or developed within 24 hours ? ?1. Are you having CP right now? yes ? ?2. Are you experiencing any other symptoms (ex. SOB, nausea, vomiting, sweating)? flushed ? ?3. How long have you been experiencing CP? Since 4 am this morning ? ?4. Is your CP continuous or coming and going? continuous ? ?5. Have you taken Nitroglycerin? No ? ? ?Patient states since 4 am this morning she has been having a fast HR and chest tightness. She states she is also flushed in her face. She says she has had afib before, but is not sure if this is it. ? ??  ?

## 2021-09-26 NOTE — Telephone Encounter (Signed)
Spoke with pt who reports current HR is 121 sitting with rates as high as 150.  Pt with known Afib.  She states she took her Diltiazem '240mg'$  this am at 615 along with her Eliquis.  She reports last July when this happened she had to be cardioverted.  She states she does not usually have chest discomfort. ?Pt advised to go to ED now for further evaluation due to chest discomfort and very high heart rate.  Pt verbalizes understanding and agrees with current plan. ?

## 2021-09-26 NOTE — Discharge Instructions (Signed)
Please call to follow-up with cardiologist office ?

## 2021-09-26 NOTE — Telephone Encounter (Signed)
Contacted patient regarding ED follow up appointment. She declined to be seen at the A-fib clinic for appointment at this time. She wants to see Dr. Gasper Sells for follow up appointment post cardioversion.  ?

## 2021-09-26 NOTE — ED Triage Notes (Signed)
Patient states atrial fib, chest pain, and palpitations since 0340 AM today. Patient reports a history of the same with cardioversion. ? ?

## 2021-09-28 NOTE — Progress Notes (Unsigned)
Cardiology Office Note:    Date:  09/28/2021   ID:  Amy Williamson, DOB [birth year], MRN [id number]  PCP:  Hoyt Koch, MD   [facility] Providers Cardiologist:  None Sleep Medicine:  Fransico Him, MD { Click to update primary MD,subspecialty MD or APP then REFRESH:1}    Referring MD: Hoyt Koch, *   Follow-up post DCCV  History of Present Illness:    Amy Williamson is a 71 y.o. female with a hx of essential hypertension, aortic atherosclerosis, persistent hypertension, OSA, GERD, hyperlipidemia, and morbid obesity.  Underwent DCCV on 09/26/2021.  She presented to [facility] emergency department on 09/26/2021.  She reported compliance with her Eliquis and diltiazem.  She had woke up from sleep around 4 AM and felt like she was in atrial fibrillation.  She had undergone cardioversion 1/23.  She had taken all of her morning medications.  She had eaten breakfast around 7 AM.  Her EKG showed atrial fibrillation.  Her lab work showed a potassium of 3.8 and magnesium of 2.6.  Benefits of DCCV as well as risks were discussed.  She agreed to proceed with DCCV.  She was successfully converted back to sinus rhythm.  She was discharged in stable condition on 09/27/2021.  She presents to the clinic today for follow-up evaluation states***  *** denies chest pain, shortness of breath, lower extremity edema, fatigue, palpitations, melena, hematuria, hemoptysis, diaphoresis, weakness, presyncope, syncope, orthopnea, and PND.   Past Medical History:  Diagnosis Date   Anticoagulant long-term use    eliquis --- managed by cardiology   Arthritis    BACK AND SHOULDERS   Depressive disorder, not elsewhere classified    Essential hypertension    followed by pcp and cardiology   Frequency of urination    GERD (gastroesophageal reflux disease)    History of dysphagia 2015   s/p egd w/ dilation, pt did not have stricture   History of gastritis    OSA (obstructive sleep apnea)     followed by dr t. turner---  study in epic 09-03-2020 moderate osa ,  pt scheduled for cpap titration 12-08-2020   PAF (paroxysmal atrial fibrillation) [facility])    cardiologist-- [name]---- new onset 06-30-2020 at pcp office visit,  s/p DCCV 07-28-2020 successful   Palpitations 06/2020   11-16-2020  per pt denies palpitations since DCCV 07-28-2020   Plantar fasciitis 08/2017   left foot    Pure hypercholesterolemia     Past Surgical History:  Procedure Laterality Date   BREAST LUMPECTOMY WITH RADIOACTIVE SEED LOCALIZATION Left 06/09/2020   Procedure: LEFT BREAST LUMPECTOMY WITH RADIOACTIVE SEED LOCALIZATION;  Surgeon: Coralie Keens, MD;  Location: [place];  Service: General;  Laterality: Left;  LMA   CARDIOVERSION N/A 07/28/2020   Procedure: CARDIOVERSION;  Surgeon: Werner Lean, MD;  Location: [facility];  Service: Cardiovascular;  Laterality: N/A;   CARPAL TUNNEL RELEASE Bilateral right 2004;  left 2006   CATARACT EXTRACTION W/ INTRAOCULAR LENS  IMPLANT, BILATERAL  2016   CESAREAN SECTION  8315,1761   BILATERAL TUBAL LIGATION W/ LAST C/S   COLONOSCOPY WITH PROPOFOL  last one 08/ 2013   [place] N/A 11/22/2020   Procedure: [place];  Surgeon: Megan Salon, MD;  Location: [place];  Service: Gynecology;  Laterality: N/A;   EAR CYST EXCISION  08/03/2011   patient states this is incorrect   ESOPHAGOGASTRODUODENOSCOPY (EGD) WITH ESOPHAGEAL  DILATION  08/2013   KNEE ARTHROSCOPY W/ MENISCAL REPAIR Left 08-03-2011  '@[facility]'$    and excision baker's cyst   PULLEY RELEASE RIGHT TRIGGER FINGER Right 01/08/2011   TRIGGER FINGER RELEASE Bilateral left 05/06/14;  right  02-29-2014    Current Medications: No outpatient medications have been marked as taking for the 10/03/21 encounter (Appointment) with Deberah Pelton, NP.     Allergies:   Atenolol, Avelox  [moxifloxacin hcl in nacl], Azithromycin, Doxycycline, Myrbetriq [mirabegron], and Oxybutynin   Social History   Socioeconomic History   Marital status: Married    Spouse name: david   Number of children: 2   Years of education: Not on file   Highest education level: Not on file  Occupational History   Occupation: retired  Tobacco Use   Smoking status: Never   Smokeless tobacco: Never  Vaping Use   Vaping Use: Never used  Substance and Sexual Activity   Alcohol use: No   Drug use: Never   Sexual activity: Not on file    Comment: BTL  Other Topics Concern   Not on file  Social History Narrative   Not on file   Social Determinants of Health   Financial Resource Strain: Low Risk    Difficulty of Paying Living Expenses: Not hard at all  Food Insecurity: No Food Insecurity   Worried About Charity fundraiser in the Last Year: Never true   [place] in the Last Year: Never true  Transportation Needs: No Transportation Needs   Lack of Transportation (Medical): No   Lack of Transportation (Non-Medical): No  Physical Activity: Inactive   Days of Exercise per Week: 0 days   Minutes of Exercise per Session: 0 min  Stress: No Stress Concern Present   Feeling of Stress : Not at all  Social Connections: Moderately Integrated   Frequency of Communication with Friends and Family: Twice a week   Frequency of Social Gatherings with Friends and Family: Twice a week   Attends Religious Services: More than 4 times per year   Active Member of [employer] or Organizations: No   Attends Music therapist: Never   Marital Status: Married     Family History: The patient's ***family history includes Clotting disorder in her father; Heart disease in her father; Heart failure in her mother; Hyperlipidemia in her mother; Hypertension in her mother; Liver cancer in her brother; Liver disease in her maternal aunt; Osteoporosis in her mother. There is no history of Colon  cancer.  ROS:   Please see the history of present illness.    *** All other systems reviewed and are negative.   Risk Assessment/Calculations:   {Does this patient have ATRIAL FIBRILLATION?:[phone]}       Physical Exam:    VS:  LMP 07/23/2004     Wt Readings from Last 3 Encounters:  09/26/21 211 lb (95.7 kg)  09/18/21 218 lb (98.9 kg)  07/26/21 215 lb (97.5 kg)     GEN: *** Well nourished, well developed in no acute distress HEENT: Normal NECK: No JVD; No carotid bruits LYMPHATICS: No lymphadenopathy CARDIAC: ***RRR, no murmurs, rubs, gallops RESPIRATORY:  Clear to auscultation without rales, wheezing or rhonchi  ABDOMEN: Soft, non-tender, non-distended MUSCULOSKELETAL:  No edema; No deformity  SKIN: Warm and dry NEUROLOGIC:  Alert and oriented x 3 PSYCHIATRIC:  Normal affect    EKGs/Labs/Other Studies Reviewed:    The following studies were reviewed today:  Echocardiogram 08/10/2020  IMPRESSIONS  1. Left ventricular ejection fraction, by estimation, is 60 to 65%. The  left ventricle has normal function. The left ventricle has no regional  wall motion abnormalities. Left ventricular diastolic parameters are  consistent with Grade II diastolic  dysfunction (pseudonormalization). Elevated left ventricular end-diastolic  pressure.   2. Right ventricular systolic function is normal. The right ventricular  size is normal.   3. The mitral valve is normal in structure. Trivial mitral valve  regurgitation. No evidence of mitral stenosis.   4. The aortic valve is tricuspid. Aortic valve regurgitation is not  visualized. No aortic stenosis is present.   5. The inferior vena cava is normal in size with greater than 50%  respiratory variability, suggesting right atrial pressure of 3 mmHg.  EKG 09/26/2021 Sinus rhythm no ST or T wave deviation 78 bpm  EKG:  EKG is *** ordered today.  The ekg ordered today demonstrates ***  Recent Labs: 07/03/2021: ALT  12 09/26/2021: B Natriuretic Peptide 200.5; BUN 24; Creatinine, Ser 0.74; Hemoglobin 14.1; Magnesium 2.6; Platelets 264; Potassium 3.8; Sodium 137  Recent Lipid Panel    Component Value Date/Time   CHOL 155 05/09/2021 0732   TRIG 118 05/09/2021 0732   TRIG 54 06/25/2006 0725   HDL 62 05/09/2021 0732   CHOLHDL 2.5 05/09/2021 0732   CHOLHDL 3 06/30/2020 0903   VLDL 23.2 06/30/2020 0903   LDLCALC 72 05/09/2021 0732   LDLDIRECT 142.3 03/16/2013 0732    ASSESSMENT & PLAN    Atrial fibrillation with RVR-EKG today shows***.  Denies recent episodes of irregular or accelerated heartbeat.  Ports compliance with apixaban and diltiazem.  Denies bleeding issues.  Underwent successful DCCV on 09/26/2021.  Reports compliance with CPAP. Continue apixaban, diltiazem Heart healthy low-sodium diet Increase physical activity as tolerated Avoid triggers caffeine, chocolate, EtOH, dehydration etc.  Essential hypertension-BP today***.  Well-controlled at home. Continue diltiazem Heart healthy low-sodium diet-salty 6 given Increase physical activity as tolerated  OSA-reports compliance with CPAP.  Waking up well rested. Continue CPAP use Follows with Dr. Radford Pax  Hyperlipidemia-LDL***. Continue rosuvastatin Heart healthy low-sodium high-fiber diet Increase physical activity as tolerated   Disposition: Follow-up with Dr. Radford Pax or APP in 3-4 months.    {Are you ordering a CV Procedure (e.g. stress test, cath, DCCV, TEE, etc)?   Press F2        :[id number]}    Medication Adjustments/Labs and Tests Ordered: Current medicines are reviewed at length with the patient today.  Concerns regarding medicines are outlined above.  No orders of the defined types were placed in this encounter.  No orders of the defined types were placed in this encounter.   There are no Patient Instructions on file for this visit.   Signed, Deberah Pelton, NP  09/28/2021 9:24 AM      Notice: This dictation was  prepared with Dragon dictation along with smaller phrase technology. Any transcriptional errors that result from this process are unintentional and may not be corrected upon review.  I spent***minutes examining this patient, reviewing medications, and using patient centered shared decision making involving her cardiac care.  Prior to her visit I spent greater than 20 minutes reviewing her past medical history,  medications, and prior cardiac tests.

## 2021-10-02 ENCOUNTER — Telehealth: Payer: Self-pay | Admitting: *Deleted

## 2021-10-02 DIAGNOSIS — J328 Other chronic sinusitis: Secondary | ICD-10-CM

## 2021-10-02 DIAGNOSIS — J309 Allergic rhinitis, unspecified: Secondary | ICD-10-CM

## 2021-10-02 NOTE — Telephone Encounter (Signed)
Dr Augustina Mood called to ask if Dr Radford Pax would consider doing a cpap titration on this patient and add a CPAP or OXYGEN. Dr Toy Cookey has performed 5 Home Sleep Studies with a AHI ranging from 17-41 and low oxygen each time. She would like to speak to Dr Radford Pax if a time could be arranged for the both of you. ? ?

## 2021-10-03 ENCOUNTER — Encounter (HOSPITAL_BASED_OUTPATIENT_CLINIC_OR_DEPARTMENT_OTHER): Payer: Self-pay | Admitting: General Practice

## 2021-10-03 ENCOUNTER — Ambulatory Visit (HOSPITAL_BASED_OUTPATIENT_CLINIC_OR_DEPARTMENT_OTHER): Payer: Medicare Other | Admitting: General Practice

## 2021-10-03 ENCOUNTER — Other Ambulatory Visit: Payer: Self-pay

## 2021-10-03 VITALS — BP 126/74 | HR 63 | Ht 60.0 in | Wt 209.6 lb

## 2021-10-03 DIAGNOSIS — E782 Mixed hyperlipidemia: Secondary | ICD-10-CM

## 2021-10-03 DIAGNOSIS — I4819 Other persistent atrial fibrillation: Secondary | ICD-10-CM | POA: Diagnosis not present

## 2021-10-03 DIAGNOSIS — I10 Essential (primary) hypertension: Secondary | ICD-10-CM

## 2021-10-03 DIAGNOSIS — G4733 Obstructive sleep apnea (adult) (pediatric): Secondary | ICD-10-CM | POA: Diagnosis not present

## 2021-10-03 NOTE — Patient Instructions (Signed)
Medication Instructions:  ?Your Physician recommend you continue on your current medication as directed.   ? ?*If you need a refill on your cardiac medications before your next appointment, please call your pharmacy* ? ?Follow-Up: ?At [facility], you and your health needs are our priority.  As part of our continuing mission to provide you with exceptional heart care, we have created designated Provider Care Teams.  These Care Teams include your primary Cardiologist (physician) and Advanced Practice Providers (APPs -  Physician Assistants and Nurse Practitioners) who all work together to provide you with the care you need, when you need it. ? ?We recommend signing up for the patient portal called "MyChart".  Sign up information is provided on this After Visit Summary.  MyChart is used to connect with patients for Virtual Visits (Telemedicine).  Patients are able to view lab/test results, encounter notes, upcoming appointments, etc.  Non-urgent messages can be sent to your provider as well.   ?To learn more about what you can do with MyChart, go to [email or web address].   ? ?Your next appointment:   ?Follow up as scheduled  ? ?Other Instructions ?We have placed a referral for you today to the A. Fib clinic, please plan to see them in about 6 months! They will call you to make this appointment!  ? ?To prevent palpitations: ?Make sure you are adequately hydrated.  ?Avoid and/or limit caffeine containing beverages like soda or tea. ?Exercise regularly.  ?Manage stress well. ?Some over the counter medications can cause palpitations such as Benadryl, AdvilPM, TylenolPM. Regular Advil or Tylenol do not cause palpitations.  ? ? ?

## 2021-10-06 ENCOUNTER — Other Ambulatory Visit: Payer: Self-pay

## 2021-10-06 ENCOUNTER — Emergency Department (HOSPITAL_COMMUNITY)
Admission: EM | Admit: 2021-10-06 | Discharge: 2021-10-06 | Disposition: A | Discharge status: Home / Self Care | Payer: Medicare Other | Attending: Emergency Medicine | Admitting: Emergency Medicine

## 2021-10-06 ENCOUNTER — Encounter (HOSPITAL_COMMUNITY): Payer: Self-pay

## 2021-10-06 DIAGNOSIS — Z7901 Long term (current) use of anticoagulants: Secondary | ICD-10-CM | POA: Diagnosis not present

## 2021-10-06 DIAGNOSIS — I480 Paroxysmal atrial fibrillation: Secondary | ICD-10-CM

## 2021-10-06 DIAGNOSIS — R000 Tachycardia, unspecified: Secondary | ICD-10-CM | POA: Diagnosis present

## 2021-10-06 DIAGNOSIS — I4891 Unspecified atrial fibrillation: Secondary | ICD-10-CM | POA: Diagnosis not present

## 2021-10-06 LAB — BASIC METABOLIC PANEL
Anion gap: 10 (ref 5–15)
BUN: 20 mg/dL (ref 8–23)
CO2: 26 mmol/L (ref 22–32)
Calcium: 9.6 mg/dL (ref 8.9–10.3)
Chloride: 103 mmol/L (ref 98–111)
Creatinine, Ser: 0.89 mg/dL (ref 0.44–1.00)
GFR, Estimated: >60 mL/min (ref >60)
Glucose, Bld: 120 mg/dL — ABNORMAL HIGH (ref 70–99)
Potassium: 4 mmol/L (ref 3.5–5.1)
Sodium: 139 mmol/L (ref 135–145)

## 2021-10-06 LAB — CBC
HCT: 43.3 % (ref 36.0–46.0)
Hemoglobin: 14 g/dL (ref 12.0–15.0)
MCH: 28.2 pg (ref 26.0–34.0)
MCHC: 32.3 g/dL (ref 30.0–36.0)
MCV: 87.3 fL (ref 80.0–100.0)
Platelets: 269 10*3/uL (ref 150–400)
RBC: 4.96 MIL/uL (ref 3.87–5.11)
RDW: 15.6 % — ABNORMAL HIGH (ref 11.5–15.5)
WBC: 15.5 10*3/uL — ABNORMAL HIGH (ref 4.0–10.5)
nRBC: 0 % (ref 0.0–0.2)

## 2021-10-06 LAB — MAGNESIUM: Magnesium: 2.5 mg/dL — ABNORMAL HIGH (ref 1.7–2.4)

## 2021-10-06 LAB — TROPONIN I (HIGH SENSITIVITY): Troponin I (High Sensitivity): 5 ng/L (ref <18)

## 2021-10-06 MED ORDER — ETOMIDATE 2 MG/ML IV SOLN
10.0000 mg | Freq: Once | INTRAVENOUS | Status: AC
  Administered 2021-10-06: 10 mg via INTRAVENOUS
  Filled 2021-10-06: qty 10

## 2021-10-06 MED ORDER — DILTIAZEM HCL 25 MG/5ML IV SOLN
20.0000 mg | Freq: Once | INTRAVENOUS | Status: AC
  Administered 2021-10-06: 20 mg via INTRAVENOUS
  Filled 2021-10-06: qty 5

## 2021-10-06 MED ORDER — FENTANYL CITRATE PF 50 MCG/ML IJ SOSY
12.5000 ug | PREFILLED_SYRINGE | Freq: Once | INTRAMUSCULAR | Status: AC
  Administered 2021-10-06: 12.5 ug via INTRAVENOUS
  Filled 2021-10-06: qty 1

## 2021-10-06 NOTE — Discharge Instructions (Signed)
As discussed, your evaluation today has been largely reassuring.  But, it is important that you monitor your condition carefully, and do not hesitate to return to the ED if you develop new, or concerning changes in your condition. ? ?Otherwise, please follow-up with your physician for appropriate ongoing care. ? ?

## 2021-10-06 NOTE — ED Provider Notes (Signed)
?[place] DEPT ?Provider Note ? ? ?CSN: [id number] ?Arrival date & time: 10/06/21  1520 ? ?  ? ?History ? ?Chief Complaint  ?Patient presents with  ? Tachycardia  ? ? ?Amy Williamson is a 71 y.o. female. ? ?HPI ?Amy Williamson is a 71 y/o F with a hx of A-fib (currently taking Diltiazem '240mg'$  qd and Eliquis '5mg'$  PO bid) who presents to the ED for heart palpitations since 1330 this afternoon. Pt states she was at home when her heart began to race as if it was "beating of my chest," leading her to believe she was back in A-fib. Pt states she also felt SOB and fatigued with anterior chest tightness that slightly radiated into her L arm. Pt states she was seen in the ED last week after going into A-fib which required cardioversion. Prior to that cardioversion the pt was cardioverted ~15 months ago. She reports compliance with her medications. Denies any recent LE edema or pain, weight changes, or fever/chills. No other medical concerns reported.  ?  ? ?Home Medications ?Prior to Admission medications   ?Medication Sig Start Date End Date Taking? Authorizing Provider  ?acetaminophen (TYLENOL) 650 MG CR tablet Take 650 mg by mouth in the morning and at bedtime.   Yes [provider]  ?apixaban (ELIQUIS) 5 MG TABS tablet Take 1 tablet (5 mg total) by mouth 2 (two) times daily. 02/13/21  Yes Hoyt Koch, MD  ?diltiazem (CARDIZEM CD) 240 MG 24 hr capsule Take 1 capsule (240 mg total) by mouth daily. 12/14/20  Yes Chandrasekhar, Mahesh A, MD  ?fluticasone (FLONASE) 50 MCG/ACT nasal spray Place 2 sprays into both nostrils daily as needed for allergies or rhinitis.   Yes [provider]  ?loratadine (CLARITIN) 10 MG tablet Take 10 mg by mouth daily as needed for allergies.   Yes [provider]  ?omeprazole (PRILOSEC) 20 MG capsule Take 20 mg by mouth daily. 09/19/21  Yes [provider]  ?rosuvastatin (CRESTOR) 20 MG tablet Take 1 tablet (20 mg total)  by mouth daily. 02/06/21  Yes Chandrasekhar, Mahesh A, MD  ?traZODone (DESYREL) 50 MG tablet TAKE 0.5-1 TABLETS BY MOUTH AT BEDTIME AS NEEDED FOR SLEEP. ?Patient taking differently: Take 50 mg by mouth at bedtime. 04/19/21  Yes Hoyt Koch, MD  ?budesonide-formoterol [facility]) 80-4.5 MCG/ACT inhaler Take 2 puffs first thing in am and then another 2 puffs about 12 hours later. ?Patient not taking: Reported on 10/03/2021 04/20/21   Tanda Rockers, MD  ?pantoprazole (PROTONIX) 40 MG tablet TAKE 1 TABLET (40 MG TOTAL) BY MOUTH DAILY. TAKE 30-60 MIN BEFORE FIRST MEAL OF THE DAY ?Patient not taking: Reported on 10/03/2021 07/05/21   Tanda Rockers, MD  ?   ? ?Allergies    ?Atenolol, Avelox [moxifloxacin hcl in nacl], Azithromycin, Doxycycline, Myrbetriq [mirabegron], and Oxybutynin   ? ?Review of Systems   ?Review of Systems  ?Constitutional:   ?     Per HPI, otherwise negative  ?HENT:    ?     Per HPI, otherwise negative  ?Respiratory:    ?     Per HPI, otherwise negative  ?Cardiovascular:   ?     Per HPI, otherwise negative  ?Gastrointestinal:  Negative for vomiting.  ?Endocrine:  ?     Negative aside from HPI  ?Genitourinary:   ?     Neg aside from HPI   ?Musculoskeletal:   ?     Per HPI, otherwise negative  ?  Skin: Negative.   ?Neurological:  Negative for syncope.  ? ?Physical Exam ?Updated Vital Signs ?BP (!) 139/121   Pulse (!) 59   Temp 98.1 ?F (36.7 ?C) (Oral)   Resp 16   Ht 5' (1.524 m)   Wt 95.3 kg   LMP 07/23/2004   SpO2 97%   BMI 41.01 kg/m?  ?Physical Exam ?Vitals and nursing note reviewed.  ?Constitutional:   ?   General: She is not in acute distress. ?   Appearance: She is well-developed.  ?HENT:  ?   Head: Normocephalic and atraumatic.  ?Eyes:  ?   Conjunctiva/sclera: Conjunctivae normal.  ?Cardiovascular:  ?   Rate and Rhythm: Tachycardia present. Rhythm irregular.  ?Pulmonary:  ?   Effort: Pulmonary effort is normal. No respiratory distress.  ?   Breath sounds: Normal breath sounds. No  stridor.  ?Abdominal:  ?   General: There is no distension.  ?Skin: ?   General: Skin is warm and dry.  ?Neurological:  ?   Mental Status: She is alert and oriented to person, place, and time.  ?   Cranial Nerves: No cranial nerve deficit.  ?Psychiatric:     ?   Mood and Affect: Mood normal.  ? ? ?ED Results / Procedures / Treatments   ?Labs ?(all labs ordered are listed, but only abnormal results are displayed) ?Labs Reviewed  ?BASIC METABOLIC PANEL - Abnormal; Notable for the following components:  ?    Result Value  ? Glucose, Bld 120 (*)   ? All other components within normal limits  ?MAGNESIUM - Abnormal; Notable for the following components:  ? Magnesium 2.5 (*)   ? All other components within normal limits  ?CBC - Abnormal; Notable for the following components:  ? WBC 15.5 (*)   ? RDW 15.6 (*)   ? All other components within normal limits  ?TROPONIN I (HIGH SENSITIVITY)  ?TROPONIN I (HIGH SENSITIVITY)  ? ? ?EKG ?None ? ?Radiology ?No results found. ? ?Procedures ?.Sedation ? ?Date/Time: 10/06/2021 7:07 PM ?Performed by: Carmin Muskrat, MD ?Authorized by: Carmin Muskrat, MD  ? ?Consent:  ?  Consent obtained:  Verbal ?  Consent given by:  Patient ?  Risks discussed:  Inadequate sedation, nausea, vomiting, respiratory compromise necessitating ventilatory assistance and intubation, prolonged sedation necessitating reversal, allergic reaction, dysrhythmia and prolonged hypoxia resulting in organ damage ?  Alternatives discussed:  Anxiolysis ?Universal protocol:  ?  Procedure explained and questions answered to patient or proxy's satisfaction: yes   ?  Relevant documents present and verified: yes   ?  Test results available: yes   ?  Required blood products, implants, devices, and special equipment available: yes   ?  Site/side marked: yes   ?  Immediately prior to procedure, a time out was called: yes   ?  Patient identity confirmed:  Verbally with patient ?Indications:  ?  Procedure performed:  Cardioversion ?   Procedure necessitating sedation performed by:  Physician performing sedation ?Pre-sedation assessment:  ?  Time since last food or drink:  4 ?  ASA classification: class 2 - patient with mild systemic disease   ?  Mouth opening:  2 finger widths ?  Thyromental distance:  2 finger widths ?  Mallampati score:  II - soft palate, uvula, fauces visible ?  Neck mobility: reduced   ?  Pre-sedation assessments completed and reviewed: airway patency, cardiovascular function, hydration status, mental status, nausea/vomiting, pain level, respiratory function and temperature   ?  Pre-sedation assessment completed:  10/06/2021 6:00 PM ?Immediate pre-procedure details:  ?  Reassessment: Patient reassessed immediately prior to procedure   ?  Reviewed: vital signs   ?  Verified: bag valve mask available, emergency equipment available, intubation equipment available, IV patency confirmed, oxygen available, reversal medications available and suction available   ?Procedure details (see MAR for exact dosages):  ?  Preoxygenation:  Nasal cannula ?  Sedation:  Etomidate ?  Intended level of sedation: deep ?  Analgesia:  Fentanyl ?  Intra-procedure monitoring:  Blood pressure monitoring, cardiac monitor, continuous pulse oximetry, continuous capnometry, frequent LOC assessments and frequent vital sign checks ?  Intra-procedure events: none   ?  Total Provider sedation time (minutes):  20 ?Post-procedure details:  ?  Post-sedation assessment completed:  10/06/2021 7:10 PM ?  Attendance: Constant attendance by certified staff until patient recovered   ?  Recovery: Patient returned to pre-procedure baseline   ?  Post-sedation assessments completed and reviewed: airway patency, cardiovascular function, hydration status, mental status, nausea/vomiting, pain level, respiratory function and temperature   ?  Patient is stable for discharge or admission: yes   ?  Procedure completion:  Tolerated well, no immediate  complications ?.Cardioversion ? ?Date/Time: 10/06/2021 7:10 PM ?Performed by: Carmin Muskrat, MD ?Authorized by: Carmin Muskrat, MD  ? ?Consent:  ?  Consent obtained:  Emergent situation ?  Consent given by:  Patient ?  Risks discussed:  In

## 2021-10-06 NOTE — ED Triage Notes (Signed)
Patient reports that she had to be cardioverted on 09/26/21. Patient has a history of atrial fib. ?[id number]. ?

## 2021-10-06 NOTE — ED Notes (Signed)
4034 - Time out for cardioversion w/ MD Lamar Blinks., RT, Amber Ayesha Mohair., RN, Luz Brazen., RN at bedside.  Consent signed. ?1812 - Etomidate given - See MAR ?1813 - 1st sync'd shock @ 120J (as verbally ordered by MD Vanita Panda), pt cardioverted to Normal sinus rhythm after 1st shock.   ?

## 2021-10-06 NOTE — Procedures (Signed)
Patient had cardioversion without any problems. The patient tolerated wqell and is maintaining oxygen saturations @ 98% on 2 lpm [place]. RT will continue to monitor ?

## 2021-10-11 ENCOUNTER — Ambulatory Visit (HOSPITAL_COMMUNITY)
Admission: RE | Admit: 2021-10-11 | Discharge: 2021-10-11 | Disposition: A | Discharge status: Home / Self Care | Payer: Medicare Other | Source: Ambulatory Visit | Attending: Physician Assistant | Admitting: Physician Assistant

## 2021-10-11 ENCOUNTER — Other Ambulatory Visit: Payer: Self-pay

## 2021-10-11 ENCOUNTER — Encounter (HOSPITAL_COMMUNITY): Payer: Self-pay | Admitting: Physician Assistant

## 2021-10-11 VITALS — BP 146/74 | HR 62 | Ht 60.0 in | Wt 209.6 lb

## 2021-10-11 DIAGNOSIS — Z7901 Long term (current) use of anticoagulants: Secondary | ICD-10-CM | POA: Diagnosis not present

## 2021-10-11 DIAGNOSIS — I4819 Other persistent atrial fibrillation: Secondary | ICD-10-CM | POA: Diagnosis present

## 2021-10-11 DIAGNOSIS — E669 Obesity, unspecified: Secondary | ICD-10-CM | POA: Diagnosis not present

## 2021-10-11 DIAGNOSIS — G4733 Obstructive sleep apnea (adult) (pediatric): Secondary | ICD-10-CM | POA: Diagnosis not present

## 2021-10-11 DIAGNOSIS — I10 Essential (primary) hypertension: Secondary | ICD-10-CM | POA: Insufficient documentation

## 2021-10-11 DIAGNOSIS — Z6841 Body Mass Index (BMI) 40.0 and over, adult: Secondary | ICD-10-CM | POA: Diagnosis not present

## 2021-10-11 DIAGNOSIS — Z8249 Family history of ischemic heart disease and other diseases of the circulatory system: Secondary | ICD-10-CM | POA: Insufficient documentation

## 2021-10-11 DIAGNOSIS — E785 Hyperlipidemia, unspecified: Secondary | ICD-10-CM | POA: Diagnosis not present

## 2021-10-11 DIAGNOSIS — D6869 Other thrombophilia: Secondary | ICD-10-CM | POA: Diagnosis not present

## 2021-10-11 DIAGNOSIS — Z79899 Other long term (current) drug therapy: Secondary | ICD-10-CM | POA: Insufficient documentation

## 2021-10-11 LAB — TSH: TSH: 1.22 u[IU]/mL (ref 0.350–4.500)

## 2021-10-11 MED ORDER — FLECAINIDE ACETATE 50 MG PO TABS: 50.0000 mg | ORAL_TABLET | Freq: Two times a day (BID) | ORAL | 3 refills | Status: DC

## 2021-10-11 NOTE — Progress Notes (Signed)
? ? ?Primary Care Physician: Hoyt Koch, MD ?Primary Cardiologist: Dr Gasper Sells ?Primary Electrophysiologist: none ?Referring Physician: Coletta Memos NP ? ? ?Amy Williamson is a 71 y.o. female with a history of HTN, aortic atherosclerosis, OSA, HLD, atrial fibrillation who presents for consultation in the [facility].  The patient was initially diagnosed with atrial fibrillation 06/2020 and underwent DCCV on 07/28/20. She presented to the ED 09/26/21 with symptoms palpitations and underwent DCCV but unfortunately returned to the ED on 10/06/21 and had another DCCV. Patient is on Eliquis for a CHADS2VASC score of 4. She has symptoms of fatigue while in afib and for several days afterwards. She remain fatigued today despite being in SR. She reports good compliance with her oral device for OSA. She denies alcohol use. She did have a steroid injection for bursitis a week before her first ED visit.  ? ?Today, she denies symptoms of palpitations, chest pain, shortness of breath, orthopnea, PND, lower extremity edema, dizziness, presyncope, syncope, bleeding, or neurologic sequela. The patient is tolerating medications without difficulties and is otherwise without complaint today.  ? ? ?Atrial Fibrillation Risk Factors: ? ?she does have symptoms or diagnosis of sleep apnea. ?she is compliant with oral device.  ?she does not have a history of rheumatic fever. ?she does not have a history of alcohol use. ?The patient does not have a history of early familial atrial fibrillation or other arrhythmias. ? ?she has a BMI of Body mass index is 40.93 kg/m?Marland KitchenMarland Kitchen ?Filed Weights  ? 10/11/21 1053  ?Weight: 95.1 kg  ? ? ?Family History  ?Problem Relation Age of Onset  ? Heart failure Mother   ? Osteoporosis Mother   ? Hyperlipidemia Mother   ? Hypertension Mother   ? Heart disease Father   ? Clotting disorder Father   ? Liver cancer Brother   ? Liver disease Maternal Aunt   ?     x2  ? Colon cancer  Neg Hx   ? ? ? ?Atrial Fibrillation Management history: ? ?Previous antiarrhythmic drugs: none ?Previous cardioversions: 07/28/20, 09/26/21, 10/06/21 ?Previous ablations: none ?CHADS2VASC score: 4 ?Anticoagulation history: Eliquis ? ? ?Past Medical History:  ?Diagnosis Date  ? Anticoagulant long-term use   ? eliquis --- managed by cardiology  ? Arthritis   ? BACK AND SHOULDERS  ? Depressive disorder, not elsewhere classified   ? Essential hypertension   ? followed by pcp and cardiology  ? Frequency of urination   ? GERD (gastroesophageal reflux disease)   ? History of dysphagia 2015  ? s/p egd w/ dilation, pt did not have stricture  ? History of gastritis   ? OSA (obstructive sleep apnea)   ? followed by dr t. turner---  study in epic 09-03-2020 moderate osa ,  pt scheduled for cpap titration 12-08-2020  ? PAF (paroxysmal atrial fibrillation) ([place])   ? cardiologist-- [name]---- new onset 06-30-2020 at pcp office visit,  s/p DCCV 07-28-2020 successful  ? Palpitations 06/2020  ? 11-16-2020  per pt denies palpitations since DCCV 07-28-2020  ? Plantar fasciitis 08/2017  ? left foot   ? Pure hypercholesterolemia   ? ?Past Surgical History:  ?Procedure Laterality Date  ? BREAST LUMPECTOMY WITH RADIOACTIVE SEED LOCALIZATION Left 06/09/2020  ? Procedure: LEFT BREAST LUMPECTOMY WITH RADIOACTIVE SEED LOCALIZATION;  Surgeon: Coralie Keens, MD;  Location: [facility];  Service: General;  Laterality: Left;  LMA  ? CARDIOVERSION N/A 07/28/2020  ? Procedure: CARDIOVERSION;  Surgeon: Rudean Haskell  A, MD;  Location: [facility];  Service: Cardiovascular;  Laterality: N/A;  ? CARPAL TUNNEL RELEASE Bilateral right 2004;  left 2006  ? CATARACT EXTRACTION W/ INTRAOCULAR LENS  IMPLANT, BILATERAL  2016  ? CESAREAN SECTION  [id number]  ? BILATERAL TUBAL LIGATION W/ LAST C/S  ? COLONOSCOPY WITH PROPOFOL  last one 08/ 2013  ? DILATATION & CURETTAGE/HYSTEROSCOPY WITH MYOSURE N/A 11/22/2020  ? Procedure:  [place];  Surgeon: Megan Salon, MD;  Location: [facility];  Service: Gynecology;  Laterality: N/A;  ? EAR CYST EXCISION  08/03/2011  ? patient states this is incorrect  ? ESOPHAGOGASTRODUODENOSCOPY (EGD) WITH ESOPHAGEAL DILATION  08/2013  ? KNEE ARTHROSCOPY W/ MENISCAL REPAIR Left 08-03-2011  '@[facility]'$   ? and excision baker's cyst  ? PULLEY RELEASE RIGHT TRIGGER FINGER Right 01/08/2011  ? TRIGGER FINGER RELEASE Bilateral left 05/06/14;  right  02-29-2014  ? ? ?Current Outpatient Medications  ?Medication Sig Dispense Refill  ? acetaminophen (TYLENOL) 650 MG CR tablet Take 650 mg by mouth in the morning and at bedtime.    ? apixaban (ELIQUIS) 5 MG TABS tablet Take 1 tablet (5 mg total) by mouth 2 (two) times daily. 180 tablet 3  ? chlorpheniramine (CHLOR-TRIMETON) 4 MG tablet Take 4 mg by mouth 2 (two) times daily as needed for allergies.    ? diltiazem (CARDIZEM CD) 240 MG 24 hr capsule Take 1 capsule (240 mg total) by mouth daily. 90 capsule 3  ? famotidine (PEPCID) 40 MG tablet Take 40 mg by mouth at bedtime.    ? flecainide (TAMBOCOR) 50 MG tablet Take 1 tablet (50 mg total) by mouth 2 (two) times daily. 60 tablet 3  ? fluticasone (FLONASE) 50 MCG/ACT nasal spray Place 2 sprays into both nostrils daily as needed for allergies or rhinitis.    ? omeprazole (PRILOSEC) 20 MG capsule Take 20 mg by mouth daily.    ? rosuvastatin (CRESTOR) 20 MG tablet Take 1 tablet (20 mg total) by mouth daily. 90 tablet 3  ? traZODone (DESYREL) 50 MG tablet TAKE 0.5-1 TABLETS BY MOUTH AT BEDTIME AS NEEDED FOR SLEEP. 90 tablet 2  ? ?No current facility-administered medications for this encounter.  ? ? ?Allergies  ?Allergen Reactions  ? Atenolol   ?  Decreased blood pressure  ? Avelox [Moxifloxacin Hcl In Nacl]   ?  Pt did not like side effects  ? Azithromycin   ?  ?causes muscle weakness, dysphagia  ? Doxycycline Nausea Only  ?  And heartburn  ? Myrbetriq [Mirabegron]   ?  Increased  blood pressure  ? Oxybutynin Palpitations  ?  Per pt made her have rapid heart beat  ? ? ?Social History  ? ?Socioeconomic History  ? Marital status: Married  ?  Spouse name: [name]  ? Number of children: 2  ? Years of education: Not on file  ? Highest education level: Not on file  ?Occupational History  ? Occupation: retired  ?Tobacco Use  ? Smoking status: Never  ? Smokeless tobacco: Never  ? Tobacco comments:  ?  Never smoke 10/11/21  ?Vaping Use  ? Vaping Use: Never used  ?Substance and Sexual Activity  ? Alcohol use: No  ? Drug use: Never  ? Sexual activity: Not on file  ?  Comment: BTL  ?Other Topics Concern  ? Not on file  ?Social History Narrative  ? Not on file  ? ?Social Determinants of Health  ? ?Financial Resource Strain: Low  Risk   ? Difficulty of Paying Living Expenses: Not hard at all  ?Food Insecurity: No Food Insecurity  ? Worried About Charity fundraiser in the Last Year: Never true  ? Ran Out of Food in the Last Year: Never true  ?Transportation Needs: No Transportation Needs  ? Lack of Transportation (Medical): No  ? Lack of Transportation (Non-Medical): No  ?Physical Activity: Inactive  ? Days of Exercise per Week: 0 days  ? Minutes of Exercise per Session: 0 min  ?Stress: No Stress Concern Present  ? Feeling of Stress : Not at all  ?Social Connections: Moderately Integrated  ? Frequency of Communication with Friends and Family: Twice a week  ? Frequency of Social Gatherings with Friends and Family: Twice a week  ? Attends Religious Services: More than 4 times per year  ? Active Member of Clubs or Organizations: No  ? Attends Archivist Meetings: Never  ? Marital Status: Married  ?Intimate Partner Violence: Not At Risk  ? Fear of Current or Ex-Partner: No  ? Emotionally Abused: No  ? Physically Abused: No  ? Sexually Abused: No  ? ? ? ?ROS- All systems are reviewed and negative except as per the HPI above. ? ?Physical Exam: ?Vitals:  ? 10/11/21 1053  ?BP: (!) 146/74  ?Pulse: 62   ?Weight: 95.1 kg  ?Height: 5' (1.524 m)  ? ? ?GEN- The patient is a well appearing obese female, alert and oriented x 3 today.   ?Head- normocephalic, atraumatic ?Eyes-  Sclera clear, conjunctiva pink ?Ears- hearin

## 2021-10-11 NOTE — Patient Instructions (Signed)
Start Flecainide '50mg'$  twice a day ? ? ?Scheduling will call regarding stress testing once approval from insurance is completed. ?

## 2021-10-11 NOTE — Addendum Note (Signed)
Encounter addended by: Oliver Barre, PA on: 10/11/2021 12:23 PM ? Actions taken: Order list changed, Diagnosis association updated

## 2021-10-11 NOTE — Telephone Encounter (Signed)
Dr Toy Cookey would like to speak with you over the phone [phone] to discuss. ?

## 2021-10-12 ENCOUNTER — Other Ambulatory Visit (HOSPITAL_COMMUNITY): Payer: Self-pay | Admitting: *Deleted

## 2021-10-12 DIAGNOSIS — I480 Paroxysmal atrial fibrillation: Secondary | ICD-10-CM

## 2021-10-15 IMAGING — MG MM BREAST SURGICAL SPECIMEN
1 series · 2 of 2 positions shown · non-contrast
Comparison: Previous exam(s).

CLINICAL DATA: Status post surgical excision today after earlier
radioactive seed localization.

EXAM:
SPECIMEN RADIOGRAPH OF THE LEFT BREAST

[Series 1: L · left · 0.07mm/px · 2 of 2 slices shown]
[im 1/2]
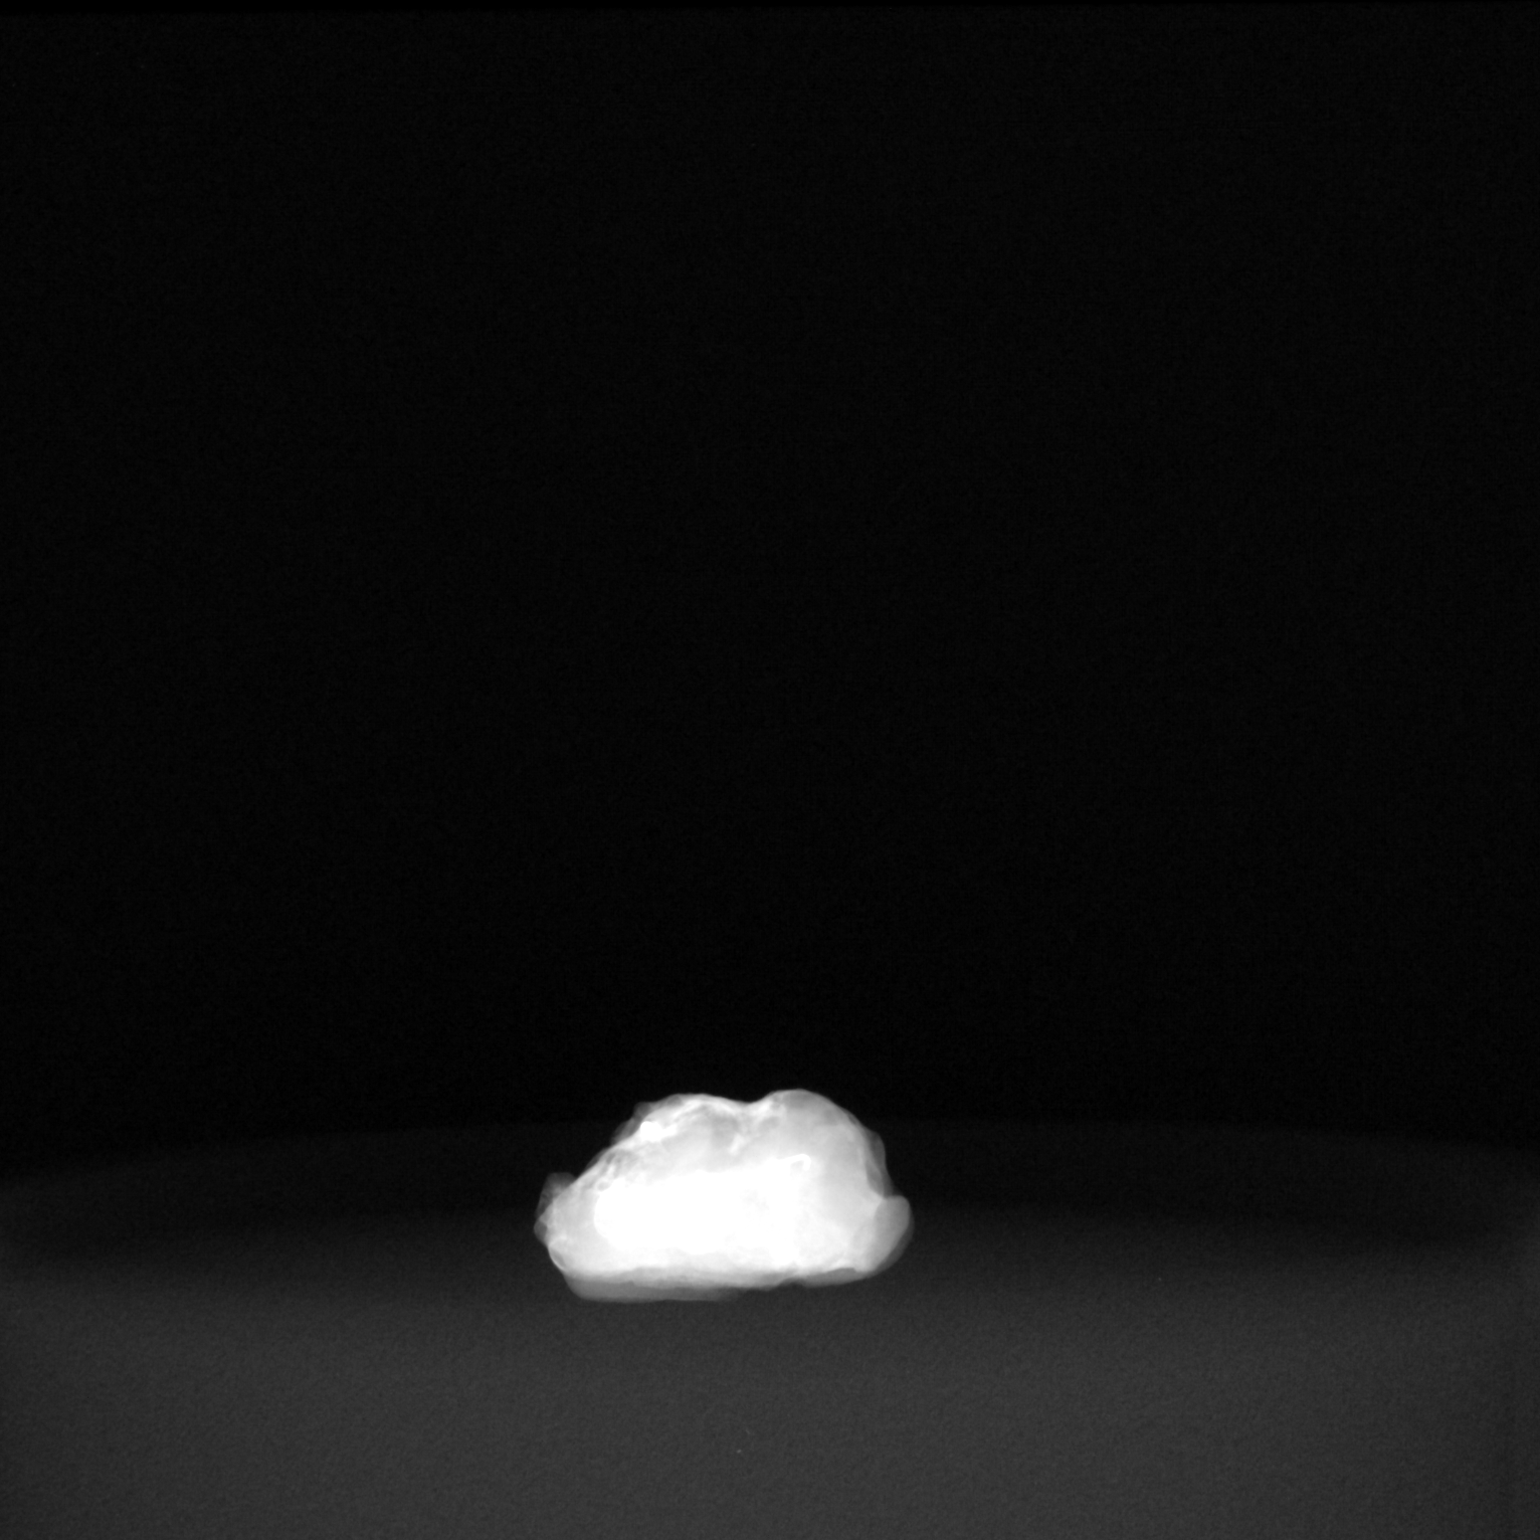
[im 2/2]
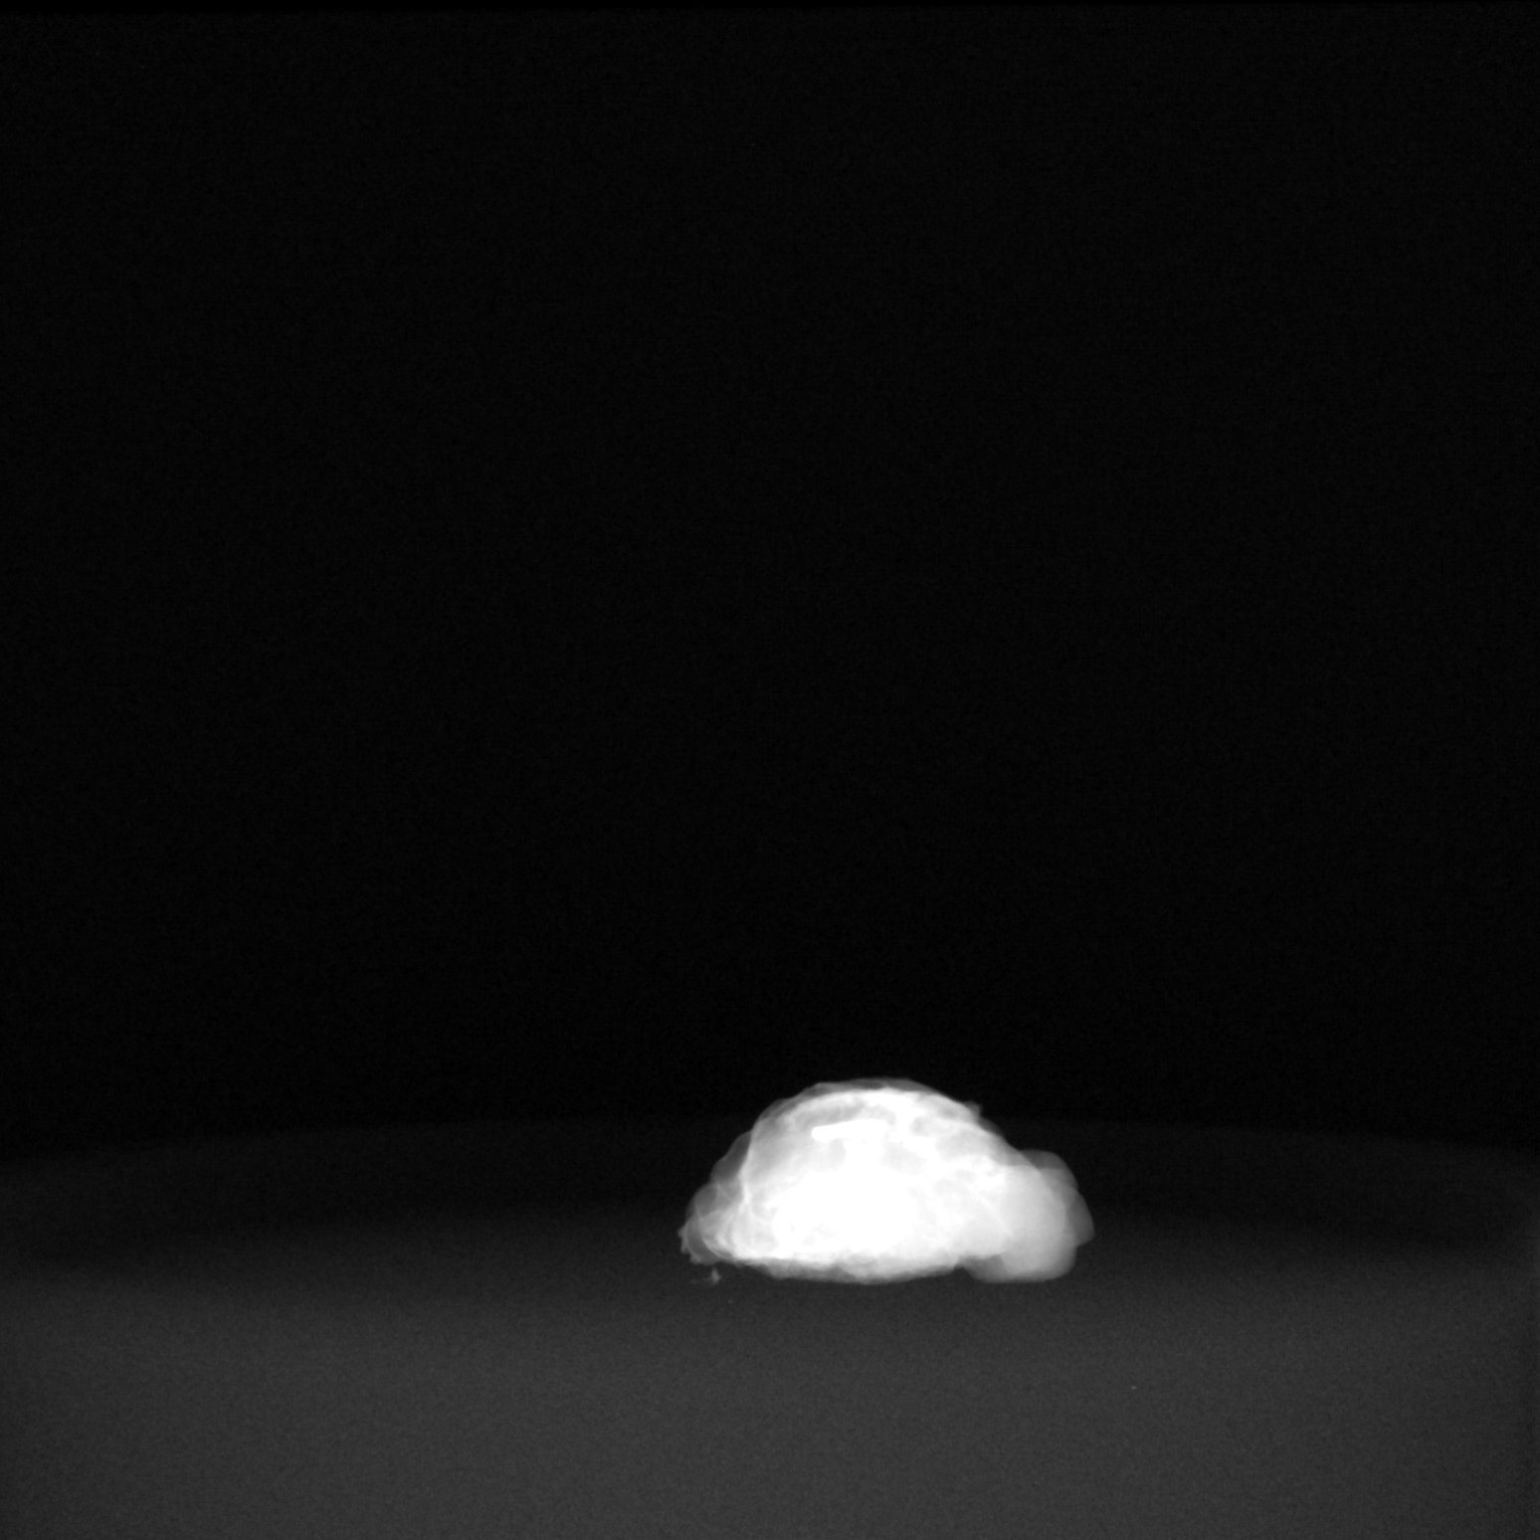

[2 of 2 positions shown; findings below may reference images not displayed]

FINDINGS: Status post excision of the left breast. The radioactive seed and
biopsy marker clip are present and completely intact within the
specimen. Findings discussed with the OR staff during the procedure
IMPRESSION: Specimen radiograph of the left breast.

## 2021-10-16 ENCOUNTER — Other Ambulatory Visit: Payer: Self-pay

## 2021-10-16 ENCOUNTER — Ambulatory Visit (HOSPITAL_COMMUNITY)
Admission: RE | Admit: 2021-10-16 | Discharge: 2021-10-16 | Disposition: A | Discharge status: Home / Self Care | Payer: Medicare Other | Source: Ambulatory Visit | Attending: Physician Assistant | Admitting: Physician Assistant

## 2021-10-16 DIAGNOSIS — I4819 Other persistent atrial fibrillation: Secondary | ICD-10-CM | POA: Diagnosis not present

## 2021-10-16 NOTE — Progress Notes (Signed)
Patient returns for ECG after starting flecainide. ECG shows SR HR 61, PR 192, QRS 86, QTc 388. Patient appears to be maintaining SR. Stress test scheduled. F/u in the AF clinic in 3 months.  ?

## 2021-10-17 ENCOUNTER — Telehealth (HOSPITAL_COMMUNITY): Payer: Self-pay

## 2021-10-17 NOTE — Telephone Encounter (Signed)
Spoke with the patient, detailed instructions given. She stated that she would be here for her test. Asked to call back with any questions. S.Sreya Froio EMTP 

## 2021-10-19 ENCOUNTER — Ambulatory Visit (HOSPITAL_COMMUNITY): Payer: Medicare Other | Attending: Cardiology

## 2021-10-19 DIAGNOSIS — I480 Paroxysmal atrial fibrillation: Secondary | ICD-10-CM | POA: Diagnosis not present

## 2021-10-19 LAB — MYOCARDIAL PERFUSION IMAGING
LV dias vol: 79 mL (ref 46–106)
LV sys vol: 24 mL
Nuc Stress EF: 69 %
Peak HR: 121 {beats}/min
Rest HR: 64 {beats}/min
Rest Nuclear Isotope Dose: 9.6 mCi
SDS: 0
SRS: 0
SSS: 0
ST Depression (mm): 0 mm
Stress Nuclear Isotope Dose: 31.5 mCi
TID: 0.98

## 2021-10-19 MED ORDER — TECHNETIUM TC 99M TETROFOSMIN IV KIT
31.5000 | PACK | Freq: Once | INTRAVENOUS | Status: AC | PRN
  Administered 2021-10-19: 31.5 via INTRAVENOUS
  Filled 2021-10-19: qty 32

## 2021-10-19 MED ORDER — REGADENOSON 0.4 MG/5ML IV SOLN
0.4000 mg | Freq: Once | INTRAVENOUS | Status: AC
  Administered 2021-10-19: 0.4 mg via INTRAVENOUS

## 2021-10-19 MED ORDER — TECHNETIUM TC 99M TETROFOSMIN IV KIT
9.6000 | PACK | Freq: Once | INTRAVENOUS | Status: AC | PRN
  Administered 2021-10-19: 9.6 via INTRAVENOUS
  Filled 2021-10-19: qty 10

## 2021-10-20 ENCOUNTER — Encounter (HOSPITAL_COMMUNITY): Payer: Self-pay | Admitting: *Deleted

## 2021-11-01 ENCOUNTER — Ambulatory Visit
Admission: RE | Admit: 2021-11-01 | Discharge: 2021-11-01 | Disposition: A | Discharge status: Home / Self Care | Payer: Medicare Other | Source: Ambulatory Visit | Attending: Obstetrics & Gynecology | Admitting: Obstetrics & Gynecology

## 2021-11-01 DIAGNOSIS — E2839 Other primary ovarian failure: Secondary | ICD-10-CM

## 2021-11-08 ENCOUNTER — Telehealth (HOSPITAL_COMMUNITY): Payer: Self-pay | Admitting: *Deleted

## 2021-11-08 DIAGNOSIS — I480 Paroxysmal atrial fibrillation: Secondary | ICD-10-CM

## 2021-11-08 MED ORDER — FLECAINIDE ACETATE 50 MG PO TABS: 25.0000 mg | ORAL_TABLET | Freq: Two times a day (BID) | ORAL | 3 refills | Status: DC

## 2021-11-08 NOTE — Telephone Encounter (Signed)
Patient called in stating since starting flecainide she is having increase in weight/swelling and abdominal bloating.  Pt also experiencing dizziness the first few hours after taking flecainide. Discussed with [name] PA will decrease flecainide to '25mg'$  BID and see if symptoms improve pt would also like to discuss ablation with EP. Referral placed. Pt will call back with update of response to decrease in flecainide.  ?

## 2021-11-09 NOTE — Telephone Encounter (Signed)
Referral has been placed. 

## 2021-11-09 NOTE — Addendum Note (Signed)
Addended by: Antonieta Iba on: 11/09/2021 11:19 AM ? ? Modules accepted: Orders ? ?

## 2021-11-09 NOTE — Telephone Encounter (Signed)
Per drTurner, ?Patient was having problems with sinus infections and has been seen by Dr. Melvyn Novas.  Decided to get an oral device by Dr. Toy Cookey.  Now cannot get her titrated on oral device with persistent moderate OSA and nocturnal hypoxemia.  Please get her in with Dr. Wilburn Cornelia or Redmond Baseman ASAP for allergic rhinitis and chronic sinusitis and also have her restart back on CPAP and see me in 3 months  ?

## 2021-11-17 ENCOUNTER — Telehealth (HOSPITAL_COMMUNITY): Payer: Self-pay | Admitting: *Deleted

## 2021-11-17 NOTE — Telephone Encounter (Signed)
Pt has been on '25mg'$  of flecainide BID for a week - some improvement of symptoms but still experiencing dizziness and just overall feels poorly. Offered to stop flecainide and discuss alternative AAD to bridge until ablation discussion end of month but pt decided she will continue flecainide until consult with EP as she does not tolerate medications well. Pt will call back if she changes her mind. Her swelling has improved on lower doses of flecainide.  ?

## 2021-11-30 ENCOUNTER — Other Ambulatory Visit: Payer: Self-pay | Admitting: Internal Medicine

## 2021-12-12 ENCOUNTER — Telehealth (HOSPITAL_COMMUNITY): Payer: Self-pay | Admitting: *Deleted

## 2021-12-12 MED ORDER — FLECAINIDE ACETATE 50 MG PO TABS: 25.0000 mg | ORAL_TABLET | Freq: Two times a day (BID) | ORAL | 3 refills | Status: DC | PRN

## 2021-12-12 NOTE — Telephone Encounter (Signed)
Pt converted to NSR after one dose of flecainide '50mg'$ .  Per [name] PA pt can use flecainide '50mg'$  BID PRN for breakthrough afib. Pt verbalized understanding.

## 2021-12-12 NOTE — Telephone Encounter (Signed)
Patient called in stating she had a steroid injection yesterday morning and converted to Afib yesterday evening. HRs in the 90-110s.  Discussed with [name] PA will try taking flecainide '50mg'$  now and call with update this afternoon to response.

## 2021-12-15 ENCOUNTER — Other Ambulatory Visit: Payer: Self-pay | Admitting: Internal Medicine

## 2021-12-20 ENCOUNTER — Encounter: Payer: Self-pay | Admitting: Cardiology

## 2021-12-20 ENCOUNTER — Ambulatory Visit: Payer: Medicare Other | Admitting: Cardiology

## 2021-12-20 VITALS — BP 140/74 | HR 72 | Ht 60.0 in | Wt 215.6 lb

## 2021-12-20 DIAGNOSIS — I4819 Other persistent atrial fibrillation: Secondary | ICD-10-CM | POA: Diagnosis not present

## 2021-12-20 DIAGNOSIS — I10 Essential (primary) hypertension: Secondary | ICD-10-CM

## 2021-12-20 DIAGNOSIS — G4733 Obstructive sleep apnea (adult) (pediatric): Secondary | ICD-10-CM | POA: Diagnosis not present

## 2021-12-20 NOTE — Patient Instructions (Addendum)
Medication Instructions:  Your physician recommends that you continue on your current medications as directed. Please refer to the Current Medication list given to you today. *If you need a refill on your cardiac medications before your next appointment, please call your pharmacy*  Lab Work: Aug 17 anytime from 8 am to 4 pm.  If you have labs (blood work) drawn today and your tests are completely normal, you will receive your results only by: [place] (if you have MyChart) OR A paper copy in the mail If you have any lab test that is abnormal or we need to change your treatment, we will call you to review the results.  Testing/Procedures: Your physician has requested that you have an echocardiogram. Echocardiography is a painless test that uses sound waves to create images of your heart. It provides your doctor with information about the size and shape of your heart and how well your heart's chambers and valves are working. This procedure takes approximately one hour. There are no restrictions for this procedure.  Your physician has requested that you have cardiac CT. Cardiac computed tomography (CT) is a painless test that uses an x-ray machine to take clear, detailed pictures of your heart. For further information please visit [email or web address]. Please follow instruction sheet as given.  Your physician has recommended that you have an ablation. Catheter ablation is a medical procedure used to treat some cardiac arrhythmias (irregular heartbeats). During catheter ablation, a long, thin, flexible tube is put into a blood vessel in your groin (upper thigh), or neck. This tube is called an ablation catheter. It is then guided to your heart through the blood vessel. Radio frequency waves destroy small areas of heart tissue where abnormal heartbeats may cause an arrhythmia to start. Please see the instruction sheet given to you today.   Follow-Up: At [facility], you and your health needs  are our priority.  As part of our continuing mission to provide you with exceptional heart care, we have created designated Provider Care Teams.  These Care Teams include your primary Cardiologist (physician) and Advanced Practice Providers (APPs -  Physician Assistants and Nurse Practitioners) who all work together to provide you with the care you need, when you need it.  Your physician wants you to follow-up in: Ablation date picked Sept 7. Otila Kluver RN will call you with instructions once work up is completed.   Lenice Llamas, the Watchman Nurse Navigator, will call you after your CT once the [facility] Team has reviewed your imaging for an update on proceedings. [place]'s direct number is [phone] if you need assistance.   We recommend signing up for the patient portal called "MyChart".  Sign up information is provided on this After Visit Summary.  MyChart is used to connect with patients for Virtual Visits (Telemedicine).  Patients are able to view lab/test results, encounter notes, upcoming appointments, etc.  Non-urgent messages can be sent to your provider as well.   To learn more about what you can do with MyChart, go to [email or web address].    Any Other Special Instructions Will Be Listed Below (If Applicable).  Cardiac Ablation Cardiac ablation is a procedure to destroy (ablate) some heart tissue that is sending bad signals. These bad signals cause problems in heart rhythm. The heart has many areas that make these signals. If there are problems in these areas, they can make the heart beat in a way that is not normal. Destroying some tissues can help make the heart rhythm normal. Tell your doctor  about: Any allergies you have. All medicines you are taking. These include vitamins, herbs, eye drops, creams, and over-the-counter medicines. Any problems you or family members have had with medicines that make you fall asleep (anesthetics). Any blood disorders you have. Any surgeries you have  had. Any medical conditions you have, such as kidney failure. Whether you are pregnant or may be pregnant. What are the risks? This is a safe procedure. But problems may occur, including: Infection. Bruising and bleeding. Bleeding into the chest. Stroke or blood clots. Damage to nearby areas of your body. Allergies to medicines or dyes. The need for a pacemaker if the normal system is damaged. Failure of the procedure to treat the problem. What happens before the procedure? Medicines Ask your doctor about: Changing or stopping your normal medicines. This is important. Taking aspirin and ibuprofen. Do not take these medicines unless your doctor tells you to take them. Taking other medicines, vitamins, herbs, and supplements. General instructions Follow instructions from your doctor about what you cannot eat or drink. Plan to have someone take you home from the hospital or clinic. If you will be going home right after the procedure, plan to have someone with you for 24 hours. Ask your doctor what steps will be taken to prevent infection. What happens during the procedure?  An IV tube will be put into one of your veins. You will be given a medicine to help you relax. The skin on your neck or groin will be numbed. A cut (incision) will be made in your neck or groin. A needle will be put through your cut and into a large vein. A tube (catheter) will be put into the needle. The tube will be moved to your heart. Dye may be put through the tube. This helps your doctor see your heart. Small devices (electrodes) on the tube will send out signals. A type of energy will be used to destroy some heart tissue. The tube will be taken out. Pressure will be held on your cut. This helps stop bleeding. A bandage will be put over your cut. The exact procedure may vary among doctors and hospitals. What happens after the procedure? You will be watched until you leave the hospital or clinic. This  includes checking your heart rate, breathing rate, oxygen, and blood pressure. Your cut will be watched for bleeding. You will need to lie still for a few hours. Do not drive for 24 hours or as long as your doctor tells you. Summary Cardiac ablation is a procedure to destroy some heart tissue. This is done to treat heart rhythm problems. Tell your doctor about any medical conditions you may have. Tell him or her about all medicines you are taking to treat them. This is a safe procedure. But problems may occur. These include infection, bruising, bleeding, and damage to nearby areas of your body. Follow what your doctor tells you about food and drink. You may also be told to change or stop some of your medicines. After the procedure, do not drive for 24 hours or as long as your doctor tells you. This information is not intended to replace advice given to you by your health care provider. Make sure you discuss any questions you have with your health care provider. Document Revised: 06/11/2019 Document Reviewed: 06/11/2019 Elsevier Patient Education  [place].  Left Atrial Appendage Closure Device Implantation  Left atrial appendage (LAA) closure device implantation is a procedure to put a small device in the LAA  of the heart. The LAA is a small sac in the wall of the heart's left upper chamber. Blood clots can form in the LAA in people with atrial fibrillation (AFib). The device closes the LAA to help prevent a blood clot and stroke. AFib is a type of irregular or rapid heartbeat (arrhythmia). There is an increased risk of blood clots and stroke with AFib. This procedure helps to reduce that risk. Tell a health care provider about: Any allergies you have. All medicines you are taking, including vitamins, herbs, eye drops, creams, and over-the-counter medicines. Any problems you or family members have had with anesthetic medicines. Any blood disorders you have. Any surgeries you have  had. Any medical conditions you have. Whether you are pregnant or may be pregnant. What are the risks? Generally, this is a safe procedure. However, problems may occur, including: Infection. Bleeding. Allergic reactions to medicines or dyes. Damage to nearby structures or organs. Heart attack. Stroke. Blood clots. Changes in heart rhythm. Device failure. What happens before the procedure? Staying hydrated Follow instructions from your health care provider about hydration, which may include: Up to 2 hours before the procedure - you may continue to drink clear liquids, such as water, clear fruit juice, black coffee, and plain tea. Eating and drinking restrictions Follow instructions from your health care provider about eating and drinking, which may include: 8 hours before the procedure - stop eating heavy meals or foods, such as meat, fried foods, or fatty foods. 6 hours before the procedure - stop eating light meals or foods, such as toast or cereal. 6 hours before the procedure - stop drinking milk or drinks that contain milk. 2 hours before the procedure - stop drinking clear liquids. Medicines Ask your health care provider about: Changing or stopping your regular medicines. This is especially important if you are taking diabetes medicines or blood thinners. Taking medicines such as aspirin and ibuprofen. These medicines can thin your blood. Do not take these medicines unless your health care provider tells you to take them. Taking over-the-counter medicines, vitamins, herbs, and supplements. Tests You may have blood tests and a physical exam. You may have an electrocardiogram (ECG). This test checks your heart's electrical patterns and rhythms. General instructions Do not use any products that contain nicotine or tobacco. These include cigarettes, chewing tobacco, and vaping devices, such as e-cigarettes. If you need help quitting, ask your health care provider. Ask your health  care provider what steps will be taken to help prevent infection. These steps may include: Removing hair at the surgery site. Washing skin with a germ-killing soap. Taking antibiotic medicine. Plan to have a responsible adult take you home from the hospital or clinic. Plan to have a responsible adult care for you for the time you are told after you leave the hospital or clinic. This is important. What happens during the procedure? An IV will be inserted into one of your veins. You will be given one or more of the following: A medicine to help you relax (sedative). A medicine to make you fall asleep (general anesthetic). A small incision will be made in your groin area. A small wire will be put through the incision and into a blood vessel. Dye may be injected so X-rays can be used to guide the wire through the blood vessel. A long, thin tube (catheter) will be put over the small wire and moved up through the blood vessel to reach your heart. The closure device will be moved through  the catheter until it reaches your heart. A small hole will be made in the septum (transseptal puncture). The septum is a thin tissue that separates the upper two chambers of the heart. The device will be placed so that it closes the LAA. X-rays will be done to make sure the device is in the right place. The catheter and wire will be removed. The closure device will remain in your heart. After pressure is applied over the catheter site to prevent bleeding, a bandage (dressing) will be placed over the site where the catheter was inserted. The procedure may vary among health care providers and hospitals. What happens after the procedure? Your blood pressure, heart rate, breathing rate, and blood oxygen level will be monitored until you leave the hospital or clinic. You may have to wear compression stockings. These stockings help to prevent blood clots and reduce swelling in your legs. If you were given a sedative  during the procedure, it can affect you for several hours. Do not drive or operate machinery until your health care provider says it is safe. You may be given pain medicine. You may need to drink more fluids to wash (flush) the dye out of your body. Drink enough fluid to keep your urine pale yellow. Take over-the-counter and prescription medicines only as told by your health care provider. This is especially important if you were given blood thinners. Summary Left atrial appendage (LAA) closure device implantation is a procedure that is done to put a small device in the LAA of the heart. The LAA is a small sac in the wall of the heart's left upper chamber. The device closes the LAA to prevent stroke and other problems. Follow instructions from your health care provider before and after the procedure. This information is not intended to replace advice given to you by your health care provider. Make sure you discuss any questions you have with your health care provider. Document Revised: 03/17/2020 Document Reviewed: 03/17/2020 Elsevier Patient Education  [place].

## 2021-12-20 NOTE — Progress Notes (Signed)
Electrophysiology Office Note:    Date:  12/20/2021   ID:  Amy Williamson, DOB 22-Nov-1950, MRN [id number]  PCP:  Hoyt Koch, MD  [facility] Cardiologist:  None  [facility] Electrophysiologist:  Vickie Epley, MD   Referring MD: Oliver Barre, PA   Chief Complaint: Atrial fibrillation  History of Present Illness:    Amy Williamson is a 71 y.o. female who presents for an evaluation of atrial fibrillation at the request of [name]. Their medical history includes hypertension, GERD, atrial fibrillation.  The patient last all [name] on October 11, 2021.  The patient has had multiple prior cardioversions.  She uses an oral device for obstructive sleep apnea.  At the last appointment with [facility], flecainide was started and the patient was referred to discuss catheter ablation.  She takes Eliquis for stroke prophylaxis.  Based on phone notes, the patient converted normal sinus rhythm after a single dose of flecainide.    Past Medical History:  Diagnosis Date   Anticoagulant long-term use    eliquis --- managed by cardiology   Arthritis    BACK AND SHOULDERS   Depressive disorder, not elsewhere classified    Essential hypertension    followed by pcp and cardiology   Frequency of urination    GERD (gastroesophageal reflux disease)    History of dysphagia 2015   s/p egd w/ dilation, pt did not have stricture   History of gastritis    OSA (obstructive sleep apnea)    followed by dr t. turner---  study in epic 09-03-2020 moderate osa ,  pt scheduled for cpap titration 12-08-2020   PAF (paroxysmal atrial fibrillation) [facility])    cardiologist-- [name]---- new onset 06-30-2020 at pcp office visit,  s/p DCCV 07-28-2020 successful   Palpitations 06/2020   11-16-2020  per pt denies palpitations since DCCV 07-28-2020   Plantar fasciitis 08/2017   left foot    Pure hypercholesterolemia     Past Surgical History:  Procedure Laterality Date    BREAST LUMPECTOMY WITH RADIOACTIVE SEED LOCALIZATION Left 06/09/2020   Procedure: LEFT BREAST LUMPECTOMY WITH RADIOACTIVE SEED LOCALIZATION;  Surgeon: Coralie Keens, MD;  Location: [place];  Service: General;  Laterality: Left;  LMA   CARDIOVERSION N/A 07/28/2020   Procedure: CARDIOVERSION;  Surgeon: Werner Lean, MD;  Location: [facility];  Service: Cardiovascular;  Laterality: N/A;   CARPAL TUNNEL RELEASE Bilateral right 2004;  left 2006   CATARACT EXTRACTION W/ INTRAOCULAR LENS  IMPLANT, BILATERAL  2016   CESAREAN SECTION  [id number]   BILATERAL TUBAL LIGATION W/ LAST C/S   COLONOSCOPY WITH PROPOFOL  last one 08/ 2013   [place] N/A 11/22/2020   Procedure: [place];  Surgeon: Megan Salon, MD;  Location: [place];  Service: Gynecology;  Laterality: N/A;   EAR CYST EXCISION  08/03/2011   patient states this is incorrect   ESOPHAGOGASTRODUODENOSCOPY (EGD) WITH ESOPHAGEAL DILATION  08/2013   KNEE ARTHROSCOPY W/ MENISCAL REPAIR Left 08-03-2011  '@[facility]'$    and excision baker's cyst   PULLEY RELEASE RIGHT TRIGGER FINGER Right 01/08/2011   TRIGGER FINGER RELEASE Bilateral left 05/06/14;  right  02-29-2014    Current Medications: Current Meds  Medication Sig   acetaminophen (TYLENOL) 650 MG CR tablet Take 650 mg by mouth in the morning and at bedtime.   apixaban (ELIQUIS) 5 MG TABS tablet Take 1 tablet (5 mg total) by  mouth 2 (two) times daily.   chlorpheniramine (CHLOR-TRIMETON) 4 MG tablet Take 4 mg by mouth 2 (two) times daily as needed for allergies.   diltiazem (CARDIZEM CD) 240 MG 24 hr capsule TAKE 1 CAPSULE BY MOUTH EVERY DAY   famotidine (PEPCID) 40 MG tablet Take 40 mg by mouth at bedtime.   flecainide (TAMBOCOR) 50 MG tablet Take 0.5 tablets (25 mg total) by mouth 2 (two) times daily as needed (breakthrough afib).   fluticasone (FLONASE) 50 MCG/ACT nasal  spray Place 2 sprays into both nostrils daily as needed for allergies or rhinitis.   omeprazole (PRILOSEC) 20 MG capsule TAKE 1 CAPSULE BY MOUTH EVERY DAY   rosuvastatin (CRESTOR) 20 MG tablet Take 1 tablet (20 mg total) by mouth daily.   traZODone (DESYREL) 50 MG tablet TAKE 0.5-1 TABLETS BY MOUTH AT BEDTIME AS NEEDED FOR SLEEP.     Allergies:   Atenolol, Avelox [moxifloxacin hcl in nacl], Azithromycin, Doxycycline, Myrbetriq [mirabegron], and Oxybutynin   Social History   Socioeconomic History   Marital status: Married    Spouse name: david   Number of children: 2   Years of education: Not on file   Highest education level: Not on file  Occupational History   Occupation: retired  Tobacco Use   Smoking status: Never   Smokeless tobacco: Never   Tobacco comments:    Never smoke 10/11/21  Vaping Use   Vaping Use: Never used  Substance and Sexual Activity   Alcohol use: No   Drug use: Never   Sexual activity: Not on file    Comment: BTL  Other Topics Concern   Not on file  Social History Narrative   Not on file   Social Determinants of Health   Financial Resource Strain: Low Risk    Difficulty of Paying Living Expenses: Not hard at all  Food Insecurity: No Food Insecurity   Worried About Charity fundraiser in the Last Year: Never true   [place] in the Last Year: Never true  Transportation Needs: No Transportation Needs   Lack of Transportation (Medical): No   Lack of Transportation (Non-Medical): No  Physical Activity: Inactive   Days of Exercise per Week: 0 days   Minutes of Exercise per Session: 0 min  Stress: No Stress Concern Present   Feeling of Stress : Not at all  Social Connections: Moderately Integrated   Frequency of Communication with Friends and Family: Twice a week   Frequency of Social Gatherings with Friends and Family: Twice a week   Attends Religious Services: More than 4 times per year   Active Member of [employer] or Organizations: No    Attends Music therapist: Never   Marital Status: Married     Family History: The patient's family history includes Clotting disorder in her father; Heart disease in her father; Heart failure in her mother; Hyperlipidemia in her mother; Hypertension in her mother; Liver cancer in her brother; Liver disease in her maternal aunt; Osteoporosis in her mother. There is no history of Colon cancer.  ROS:   Please see the history of present illness.    All other systems reviewed and are negative.  EKGs/Labs/Other Studies Reviewed:    The following studies were reviewed today:  August 10, 2020 echo Normal left ventricular function, 60% Right ventricular function normal Trivial MR   EKG:  The ekg ordered today demonstrates sinus rhythm   Recent Labs: 07/03/2021: ALT 12 09/26/2021: B Natriuretic Peptide 200.5  10/06/2021: BUN 20; Creatinine, Ser 0.89; Hemoglobin 14.0; Magnesium 2.5; Platelets 269; Potassium 4.0; Sodium 139 10/11/2021: TSH 1.220  Recent Lipid Panel    Component Value Date/Time   CHOL 155 05/09/2021 0732   TRIG 118 05/09/2021 0732   TRIG 54 06/25/2006 0725   HDL 62 05/09/2021 0732   CHOLHDL 2.5 05/09/2021 0732   CHOLHDL 3 06/30/2020 0903   VLDL 23.2 06/30/2020 0903   LDLCALC 72 05/09/2021 0732   LDLDIRECT 142.3 03/16/2013 0732    Physical Exam:    VS:  BP 140/74   Pulse 72   Ht 5' (1.524 m)   Wt 215 lb 9.6 oz (97.8 kg)   LMP 07/23/2004   SpO2 97%   BMI 42.11 kg/m     Wt Readings from Last 3 Encounters:  12/20/21 215 lb 9.6 oz (97.8 kg)  10/19/21 209 lb (94.8 kg)  10/11/21 209 lb 9.6 oz (95.1 kg)     GEN:  Well nourished, well developed in no acute distress HEENT: Normal NECK: No JVD; No carotid bruits LYMPHATICS: No lymphadenopathy CARDIAC: RRR, no murmurs, rubs, gallops RESPIRATORY:  Clear to auscultation without rales, wheezing or rhonchi  ABDOMEN: Soft, non-tender, non-distended MUSCULOSKELETAL:  No edema; No deformity  SKIN: Warm  and dry NEUROLOGIC:  Alert and oriented x 3 PSYCHIATRIC:  Normal affect       ASSESSMENT:    1. Persistent atrial fibrillation ([place])   2. OSA (obstructive sleep apnea)   3. Essential hypertension   4. Morbid obesity ([place])    PLAN:    In order of problems listed above:  #Persistent atrial fibrillation Symptomatic.  Required antiarrhythmic drugs for pharmacologic cardioversion in the past.  She does not tolerate flecainide twice daily dosing due to the side effects.  She is on Eliquis for stroke prophylaxis.  Discussed treatment options today for her AF including antiarrhythmic drug therapy and ablation. Discussed risks, recovery and likelihood of success. Discussed potential need for repeat ablation procedures and antiarrhythmic drugs after an initial ablation. They wish to proceed with scheduling.  Risk, benefits, and alternatives to EP study and radiofrequency ablation for afib were also discussed in detail today. These risks include but are not limited to stroke, bleeding, vascular damage, tamponade, perforation, damage to the esophagus, lungs, and other structures, pulmonary vein stenosis, worsening renal function, and death. The patient understands these risk and wishes to proceed.  We will therefore proceed with catheter ablation at the next available time.  Carto, ICE, anesthesia are requested for the procedure.  Will also obtain CT PV protocol prior to the procedure to exclude LAA thrombus and further evaluate atrial anatomy.  Ablation strategy will be PVI plus posterior wall.  During today's appointment the patient also expressed a strong desire to avoid long-term exposure anticoagulation and its associated bleeding risk.  We discussed left atrial appendage occlusion and she wishes to pursue this which I think is a very reasonable strategy for her.  ------------  I have seen [name] in the office today who is being considered for a Watchman left atrial appendage  closure device. I believe they will benefit from this procedure given their history of atrial fibrillation, CHA2DS2-VASc score of 4 and unadjusted ischemic stroke rate of 4.2% per year. The patient's chart has been reviewed and I feel that they would be a candidate for short term oral anticoagulation after Watchman implant.   It is my belief that after undergoing a LAA closure procedure, MELANE WINDHOLZ will not need  long term anticoagulation which eliminates anticoagulation side effects and major bleeding risk.   Procedural risks for the Watchman implant have been reviewed with the patient including a 0.5% risk of stroke, <1% risk of perforation and <1% risk of device embolization.    The published clinical data on the safety and effectiveness of WATCHMAN include but are not limited to the following: - Holmes DR, Mechele Claude, Sick P et al. for the PROTECT AF Investigators. Percutaneous closure of the left atrial appendage versus warfarin therapy for prevention of stroke in patients with atrial fibrillation: a randomised non-inferiority trial. Lancet 2009; 374: 534-42. Mechele Claude, Doshi SK, Abelardo Diesel D et al. on behalf of the PROTECT AF Investigators. Percutaneous Left Atrial Appendage Closure for Stroke Prophylaxis in Patients With Atrial Fibrillation 2.3-Year Follow-up of the PROTECT AF (Watchman Left Atrial Appendage System for Embolic Protection in Patients With Atrial Fibrillation) Trial. Circulation 2013; 127:720-729. - Alli O, Doshi S,  Kar S, Reddy VY, Sievert H et al. Quality of Life Assessment in the Randomized PROTECT AF (Percutaneous Closure of the Left Atrial Appendage Versus Warfarin Therapy for Prevention of Stroke in Patients With Atrial Fibrillation) Trial of Patients at Risk for Stroke With Nonvalvular Atrial Fibrillation. J Am Coll Cardiol 2013; 29:2446-2. Vertell Limber DR, Tarri Abernethy, Price M, [place], Sievert H, Doshi S, Huber K, Reddy V. Prospective randomized evaluation of the  Watchman left atrial appendage Device in patients with atrial fibrillation versus long-term warfarin therapy; the PREVAIL trial. Journal of the [employer] of Cardiology, Vol. 4, No. 1, 2014, 1-11. - Kar S, Doshi SK, Sadhu A, Horton R, Osorio J et al. Primary outcome evaluation of a next-generation left atrial appendage closure device: results from the PINNACLE FLX trial. Circulation 2021;143(18)1754-1762.    After today's visit with the patient which was dedicated solely for shared decision making visit regarding LAA closure device, the patient decided to proceed with the LAA appendage closure procedure scheduled to be done in the near future at [facility].   HAS-BLED score 2 Hypertension Yes  Abnormal renal and liver function (Dialysis, transplant, Cr >2.26 mg/dL /Cirrhosis or Bilirubin >2x Normal or AST/ALT/AP >3x Normal) No  Stroke No  Bleeding No  Labile INR (Unstable/high INR) No  Elderly (>65) Yes  Drugs or alcohol (? 8 drinks/week, anti-plt or NSAID) No   CHA2DS2-VASc Score = 4  The patient's score is based upon: CHF History: 0 HTN History: 1 Diabetes History: 0 Stroke History: 0 Vascular Disease History: 1 Age Score: 1 Gender Score: 1    Total time spent with patient today 65 minutes. This includes reviewing records, evaluating the patient and coordinating care.  Medication Adjustments/Labs and Tests Ordered: Current medicines are reviewed at length with the patient today.  Concerns regarding medicines are outlined above.  Orders Placed This Encounter  Procedures   CBC w/Diff   Basic Metabolic Panel (BMET)   ECHOCARDIOGRAM COMPLETE   No orders of the defined types were placed in this encounter.    Signed, Hilton Cork. Quentin Ore, MD, [facility], [facility] 12/20/2021 7:33 PM    Electrophysiology [facility]

## 2021-12-21 ENCOUNTER — Ambulatory Visit: Payer: Medicare Other

## 2021-12-21 DIAGNOSIS — I4819 Other persistent atrial fibrillation: Secondary | ICD-10-CM

## 2021-12-21 DIAGNOSIS — I10 Essential (primary) hypertension: Secondary | ICD-10-CM

## 2021-12-21 DIAGNOSIS — I700 Atherosclerosis of aorta: Secondary | ICD-10-CM

## 2021-12-21 NOTE — Progress Notes (Signed)
Chronic Care Management Pharmacy Note  12/21/2021 Name:  Amy Williamson MRN:  [id number] DOB:  Apr 18, 1951  Summary: -Patient reports that she was unable to tolerate crestor - had tried to restart but stopped about 2-3 weeks ago due to muscle pains / swelling in her feet  -Planning LAA closure surgery with cardiology 03/29/2022 - followed by [place] device implantation - remains on eliquis, diltiazem, and flecainide at this time - denies issues currently   Recommendations/Changes made from today's visit: -Recommending for patient to discuss statin therapy with PCP at next appointment 01/02/2022 - consider lowering dose of crestor / trial of pravastatin 79m daily (due to interaction with atorvastatin and diltiazem  - increased likelihood of AE) -Patient to continue to monitor blood pressure, reach out should BP >150/90  Plan: -F/u in 6 months   Subjective: Amy HORVATHis an 71y.o. year old female who is a primary patient of CHoyt Koch MD.  The CCM team was consulted for assistance with disease management and care coordination needs.    Engaged with patient by telephone for follow up visit in response to provider referral for pharmacy case management and/or care coordination services.   Consent to Services:  The patient was given the following information about Chronic Care Management services today, agreed to services, and gave verbal consent: 1. CCM service includes personalized support from designated clinical staff supervised by the primary care provider, including individualized plan of care and coordination with other care providers 2. 24/7 contact phone numbers for assistance for urgent and routine care needs. 3. Service will only be billed when office clinical staff spend 20 minutes or more in a month to coordinate care. 4. Only one practitioner may furnish and bill the service in a calendar month. 5.The patient may stop CCM services at any time (effective at the end of  the month) by phone call to the office staff. 6. The patient will be responsible for cost sharing (co-pay) of up to 20% of the service fee (after annual deductible is met). Patient agreed to services and consent obtained.  Patient Care Team: CHoyt Koch MD as PCP - General (Internal Medicine) TSueanne Margarita MD as PCP - Sleep Medicine (Cardiology) LVickie Epley MD as PCP - Electrophysiology (Cardiology) BLafayette Dragon MD (Inactive) (Gastroenterology) MMegan Salon MD (Gynecology) TPeggye Form MD (Gastroenterology) DCalvert Cantor MD (Ophthalmology) GRoseanne Kaufman MD (Orthopedic Surgery) STomasa Blase [facility] (Pharmacist)  Recent office visits: 07/03/2021 - Dr. CSharlet Salina- patient would like to hold crestor due to muscle aches, agreeable to restart should muscle aches not improve - f/u in 6 months  03/28/2021 - Dr. CSharlet Salina- trazodone for sleep trial   Recent consult visits: 12/20/2021 - Dr. LQuentin Ore- Cardiology - plan for LAA closure surgery - scheduled for 03/29/2022 - no medication changes at this time  10/03/2021 - JSheela StackNP - Cardiology - follow up post DCCV - referred to Afib clinic - follow up in 6 months  09/18/2021 - Dr. WMelvyn Novas- Pulmonary Disease - stop chlorpheniramine - reduce symbicort to once daily in the AM 07/26/2021 - Dr. WMelvyn Novas- Pulmonology - chlorpheniramine for drainage / throat tickle - restart pepcid 248mat bedtime   Hospital visits: 10/06/2021 - ED visit - heart palpitations - cardioverted to NSR - no changes to medications on discharge  09/26/2021 - ED visit - heart palpitations  - cardioverted    Objective:  Lab Results  Component Value Date  CREATININE 0.89 10/06/2021   BUN 20 10/06/2021   GFR 69.79 07/03/2021   GFRNONAA >60 10/06/2021   GFRAA 79 07/08/2020   NA 139 10/06/2021   K 4.0 10/06/2021   CALCIUM 9.6 10/06/2021   CO2 26 10/06/2021   GLUCOSE 120 (H) 10/06/2021    Lab Results  Component Value Date/Time    HGBA1C 5.8 07/03/2021 09:21 AM   HGBA1C 5.7 06/30/2020 09:03 AM   GFR 69.79 07/03/2021 09:21 AM   GFR 59.14 (L) 06/30/2020 09:03 AM    Last diabetic Eye exam:  No results found for: HMDIABEYEEXA  Last diabetic Foot exam:  No results found for: HMDIABFOOTEX   Lab Results  Component Value Date   CHOL 155 05/09/2021   HDL 62 05/09/2021   LDLCALC 72 05/09/2021   LDLDIRECT 142.3 03/16/2013   TRIG 118 05/09/2021   CHOLHDL 2.5 05/09/2021       Latest Ref Rng & Units 07/03/2021    9:21 AM 05/09/2021    7:32 AM 11/28/2020   10:54 AM  Hepatic Function  Total Protein 6.0 - 8.3 g/dL 6.9   5.9   6.6    Albumin 3.5 - 5.2 g/dL 4.5   4.3   4.2    AST 0 - 37 U/L '14   14   19    ' ALT 0 - 35 U/L '12   14   20    ' Alk Phosphatase 39 - 117 U/L 69   70   63    Total Bilirubin 0.2 - 1.2 mg/dL 0.6   0.4   0.5    Bilirubin, Direct 0.00 - 0.40 mg/dL  0.14       Lab Results  Component Value Date/Time   TSH 1.220 10/11/2021 11:38 AM   TSH 2.22 06/30/2020 09:03 AM   TSH 2.57 03/30/2015 08:33 AM       Latest Ref Rng & Units 10/06/2021    3:38 PM 09/26/2021   11:11 AM 07/26/2021    9:30 AM  CBC  WBC 4.0 - 10.5 K/uL 15.5   14.4   8.3    Hemoglobin 12.0 - 15.0 g/dL 14.0   14.1   12.8    Hematocrit 36.0 - 46.0 % 43.3   41.9   39.4    Platelets 150 - 400 K/uL 269   264   234.0      Lab Results  Component Value Date/Time   VD25OH 51.06 03/16/2014 12:17 PM   VD25OH 55 03/03/2012 09:49 AM   VD25OH 55 03/05/2011 10:14 AM    Clinical ASCVD: No  The 10-year ASCVD risk score (Arnett DK, et al., 2019) is: 17.2%   Values used to calculate the score:     Age: 15 years     Sex: Female     Is Non-Hispanic African American: No     Diabetic: No     Tobacco smoker: No     Systolic Blood Pressure: 270 mmHg     Is BP treated: Yes     HDL Cholesterol: 62 mg/dL     Total Cholesterol: 155 mg/dL       06/30/2021    2:41 PM 05/15/2021    9:58 AM 09/19/2018    1:24 PM  Depression screen PHQ 2/9  Decreased  Interest 0 0 3  Down, Depressed, Hopeless 0 0 1  PHQ - 2 Score 0 0 4  Altered sleeping   3  Tired, decreased energy   3  Change in appetite  1  Feeling bad or failure about yourself    0  Trouble concentrating   3  Moving slowly or fidgety/restless   1  Suicidal thoughts   0  PHQ-9 Score   15    Social History   Tobacco Use  Smoking Status Never  Smokeless Tobacco Never  Tobacco Comments   Never smoke 10/11/21   BP Readings from Last 3 Encounters:  12/20/21 140/74  10/11/21 (!) 146/74  10/06/21 134/65   Pulse Readings from Last 3 Encounters:  12/20/21 72  10/11/21 62  10/06/21 69   Wt Readings from Last 3 Encounters:  12/20/21 215 lb 9.6 oz (97.8 kg)  10/19/21 209 lb (94.8 kg)  10/11/21 209 lb 9.6 oz (95.1 kg)   BMI Readings from Last 3 Encounters:  12/20/21 42.11 kg/m  10/19/21 40.82 kg/m  10/11/21 40.93 kg/m    Assessment/Interventions: Review of patient past medical history, allergies, medications, health status, including review of consultants reports, laboratory and other test data, was performed as part of comprehensive evaluation and provision of chronic care management services.   SDOH:  (Social Determinants of Health) assessments and interventions performed: Yes  SDOH Screenings   Alcohol Screen: Low Risk    Last Alcohol Screening Score (AUDIT): 0  Depression (PHQ2-9): Low Risk    PHQ-2 Score: 0  Financial Resource Strain: Low Risk    Difficulty of Paying Living Expenses: Not hard at all  Food Insecurity: No Food Insecurity   Worried About Charity fundraiser in the Last Year: Never true   Ran Out of Food in the Last Year: Never true  Housing: Low Risk    Last Housing Risk Score: 0  Physical Activity: Inactive   Days of Exercise per Week: 0 days   Minutes of Exercise per Session: 0 min  Social Connections: Moderately Integrated   Frequency of Communication with Friends and Family: Twice a week   Frequency of Social Gatherings with Friends  and Family: Twice a week   Attends Religious Services: More than 4 times per year   Active Member of [employer] or Organizations: No   Attends Music therapist: Never   Marital Status: Married  Stress: No Stress Concern Present   Feeling of Stress : Not at all  Tobacco Use: Low Risk    Smoking Tobacco Use: Never   Smokeless Tobacco Use: Never   Passive Exposure: Not on file  Transportation Needs: No Transportation Needs   Lack of Transportation (Medical): No   Lack of Transportation (Non-Medical): No    CCM Care Plan  Allergies  Allergen Reactions   Atenolol     Decreased blood pressure   Avelox [Moxifloxacin Hcl In Nacl]     Pt did not like side effects   Azithromycin     ?causes muscle weakness, dysphagia   Doxycycline Nausea Only    And heartburn   Myrbetriq [Mirabegron]     Increased blood pressure   Oxybutynin Palpitations    Per pt made her have rapid heart beat    Medications Reviewed Today     Reviewed by Tomasa Blase, [facility] (Pharmacist) on 12/21/21 at [phone]  Med List Status: <None>   Medication Order Taking? Sig Documenting Provider Last Dose Status Informant  acetaminophen (TYLENOL) 650 MG CR tablet [id number] Yes Take 650 mg by mouth in the morning and at bedtime. [provider] Taking Active Self  apixaban (ELIQUIS) 5 MG TABS tablet [id number] Yes Take 1 tablet (5 mg total)  by mouth 2 (two) times daily. Hoyt Koch, MD Taking Active Self  chlorpheniramine (CHLOR-TRIMETON) 4 MG tablet [id number] Yes Take 4 mg by mouth 2 (two) times daily as needed for allergies. [provider] Taking Active   diltiazem (CARDIZEM CD) 240 MG 24 hr capsule [id number] Yes TAKE 1 CAPSULE BY MOUTH EVERY DAY Chandrasekhar, Mahesh A, MD Taking Active   famotidine (PEPCID) 40 MG tablet [id number] No Take 40 mg by mouth at bedtime.  Patient not taking: Reported on 12/21/2021   [provider] Not Taking Active   flecainide (TAMBOCOR) 50 MG tablet  [id number] Yes Take 0.5 tablets (25 mg total) by mouth 2 (two) times daily as needed (breakthrough afib). Fenton, Clint R, PA Taking Active   fluticasone (FLONASE) 50 MCG/ACT nasal spray [id number] Yes Place 2 sprays into both nostrils daily as needed for allergies or rhinitis. [provider] Taking Active Self  omeprazole (PRILOSEC) 20 MG capsule [id number] Yes TAKE 1 CAPSULE BY MOUTH EVERY DAY Hoyt Koch, MD Taking Active   rosuvastatin (CRESTOR) 20 MG tablet [id number] No Take 1 tablet (20 mg total) by mouth daily.  Patient not taking: Reported on 12/21/2021   Werner Lean, MD Not Taking Active Self  traZODone (DESYREL) 50 MG tablet [id number] Yes TAKE 0.5-1 TABLETS BY MOUTH AT BEDTIME AS NEEDED FOR SLEEP. Hoyt Koch, MD Taking Active Self            Patient Active Problem List   Diagnosis Date Noted   Secondary hypercoagulable state ([place]) 10/11/2021   Upper airway cough syndrome vs cough variant asthma 04/20/2021   OSA (obstructive sleep apnea) 09/13/2020   Persistent atrial fibrillation ([place]) 07/08/2020   Aortic atherosclerosis ([place]) 06/30/2020   Routine general medical examination at a health care facility 04/02/2016   Other dysphagia 09/21/2013   Morbid obesity ([place]) 03/24/2013   DJD (degenerative joint disease) 03/24/2013   GERD (gastroesophageal reflux disease) 02/27/2012   Venous insufficiency 03/04/2010   Hyperlipidemia 01/21/2008   Essential hypertension 01/21/2008    Immunization History  Administered Date(s) Administered   Influenza Split 04/24/2011, 05/03/2012, 04/22/2013   Influenza, High Dose Seasonal PF 03/30/2015, 04/02/2016, 05/07/2017, 05/09/2018, 04/17/2019, 03/28/2021   Influenza,inj,Quad PF,6+ Mos 03/16/2014   Influenza-Unspecified 04/17/2019, 04/06/2020   PFIZER(Purple Top)SARS-COV-2 Vaccination 09/15/2019, 10/06/2019, 04/26/2020   Pneumococcal Conjugate-13 03/30/2015   Pneumococcal Polysaccharide-23 07/23/2004,  05/07/2017   Tdap 07/23/2004, 08/04/2015   Zoster Recombinat (Shingrix) 05/17/2017, 10/02/2017   Zoster, Live 05/23/2013    Conditions to be addressed/monitored:  Hypertension, Hyperlipidemia, Atrial Fibrillation, and GERD  Care Plan : CCM Pharmacy Care Plan  Updates made by Tomasa Blase, RPH since 12/21/2021 12:00 AM     Problem: HTN, HLD, Afib, GERD   Priority: High  Onset Date: 07/25/2021     Long-Range Goal: Disease Management   Start Date: 07/25/2021  Expected End Date: 07/25/2022  This Visit's Progress: On track  Recent Progress: On track  Priority: High  Note:   Current Barriers:  Unable to independently monitor therapeutic efficacy  Pharmacist Clinical Goal(s):  Patient will achieve adherence to monitoring guidelines and medication adherence to achieve therapeutic efficacy through collaboration with PharmD and provider.   Interventions: 1:1 collaboration with Hoyt Koch, MD regarding development and update of comprehensive plan of care as evidenced by provider attestation and co-signature Inter-disciplinary care team collaboration (see longitudinal plan of care) Comprehensive medication review performed; medication list updated in electronic medical record  Hyperlipidemia: (LDL goal < 70) -Controlled with most  recent LDL result - but pt not currently taking crestor due to AE  Lab Results  Component Value Date   LDLCALC 72 05/09/2021  -Current treatment: Rosuvastatin 57m - 1 tablet daily - not currently taking due to muscle aches / pains on this dose  -Medications previously tried: simvastatin  -Current dietary patterns: reports to eating diet that is low in high cholesterol / fatty  foods  -Current exercise habits: nothing at this time  -Educated on Cholesterol goals;  Benefits of statin for ASCVD risk reduction; Importance of limiting foods high in cholesterol; Exercise goal of 150 minutes per week; Strategies to manage statin-induced  myalgias; -Counseled on diet and exercise extensively Recommended for retrial of crestor 166mdaily / pravastatin 4033maily - pt plans to discuss with PCP at appointment in 2 weeks   Atrial Fibrillation (Goal: prevent stroke and major bleeding)/ Hypertension (BP goal <140/90) -Controlled - noted to have multiple instances of Afib in past 3 months, currently in NSR -Planning for LAA closure surgery 03/29/2022 - as well as [place]man device implantation - after which hope would be that patient can d/c eliquis, diltiazem, and flecainide  BP Readings from Last 3 Encounters:  12/20/21 140/74  10/11/21 (!) 146/74  10/06/21 134/65  -CHADS2VASc score: Age (1 point), Female sex (1 point), and Hypertension history (1 point) -Current treatment: Rate control: Diltiazem 240m62m capsule daily, flecainide 25mg90m prn   Anticoagulation: Eliquis 5mg -96mtablet twice daily  -Medications previously tried: amlodipine, atenolol -Home BP and HR readings: reports BP at home averages 140/70's - in office lower - patient believes cuff at home is not accurate   -Counseled on increased risk of stroke due to Afib and benefits of anticoagulation for stroke prevention; importance of adherence to anticoagulant exactly as prescribed; bleeding risk associated with Eliquis and importance of self-monitoring for signs/symptoms of bleeding; avoidance of NSAIDs due to increased bleeding risk with anticoagulants; importance of regular laboratory monitoring; seeking medical attention after a head injury or if there is blood in the urine/stool; -Recommended to continue current medication  GERD (Goal: Acid control / prevention of acid reflux) -Controlled -Current treatment  Omeprazole 20mg d16m  Famotidine 40mg at26mtime - not currently taking  -Medications previously tried: omeprazole  -Counseled on diet and exercise extensively  Health Maintenance -Vaccine gaps: COVID booster  -Current therapy:  Chloramipheniramine 4mg  - 122mblet twice daily  Flonase - 2 sprays daily as needed  APAP 650mg - 1 15met twice daily as needed  Trazodone 50mg - 1/215mablet nightly for sleep  -Patient is satisfied with current therapy and denies issues -Recommended to continue current medication  Patient Goals/Self-Care Activities Patient will:  - take medications as prescribed as evidenced by patient report and record review check blood pressure 2-3 times weekly, document, and provide at future appointments target a minimum of 150 minutes of moderate intensity exercise weekly engage in dietary modifications by reducing foods high in cholesterol / decreasing sodium intake   Follow Up Plan: Telephone follow up appointment with care management team member scheduled for: 6 months  The patient has been provided with contact information for the care management team and has been advised to call with any health related questions or concerns.          Medication Assistance: None required.  Patient affirms current coverage meets needs.  Care Gaps: COVID Booster   Patient's preferred pharmacy is:  [id number] [id number] [place]  [place] [address] Phone: [phone] Fax: [phone]  Uses pill box? No - plans to start using AM / PM pill boxes - has alarms set from PM medications  Pt endorses 100% compliance  Care Plan and Follow Up Patient Decision:  Patient agrees to Care Plan and Follow-up.  Plan: Telephone follow up appointment with care management team member scheduled for:  6 months  The patient has been provided with contact information for the care management team and has been advised to call with any health related questions or concerns.   Tomasa Blase, PharmD Clinical Pharmacist, [place]

## 2021-12-21 NOTE — Patient Instructions (Signed)
Visit Information  Following are the goals we discussed today:   Manage My Medicine   Timeframe:  Long-Range Goal Priority:  Medium Start Date: 07/25/2021                            Expected End Date: 07/25/2022                      Follow Up Date 06/2022   - call if I am sick and can't take my medicine - keep a list of all the medicines I take; vitamins and herbals too - learn to read medicine labels - use a pillbox to sort medicine - use an alarm clock or phone to remind me to take my medicine    Why is this important?   These steps will help you keep on track with your medicines.  Plan: Telephone follow up appointment with care management team member scheduled for:  6 months The patient has been provided with contact information for the care management team and has been advised to call with any health related questions or concerns.   Tomasa Blase, PharmD Clinical Pharmacist, [facility]   Please call the care guide team at [phone] if you need to cancel or reschedule your appointment.   Patient verbalizes understanding of instructions and care plan provided today and agrees to view in [place]. Active MyChart status and patient understanding of how to access instructions and care plan via MyChart confirmed with patient.

## 2021-12-27 NOTE — Addendum Note (Signed)
Addended by: Saddie Benders E on: 12/27/2021 11:15 AM   Modules accepted: Orders

## 2022-01-02 ENCOUNTER — Encounter: Payer: Self-pay | Admitting: Internal Medicine

## 2022-01-02 ENCOUNTER — Ambulatory Visit: Payer: Medicare Other | Admitting: Internal Medicine

## 2022-01-02 VITALS — BP 130/68 | HR 68 | Resp 18 | Ht 60.0 in | Wt 216.0 lb

## 2022-01-02 DIAGNOSIS — M159 Polyosteoarthritis, unspecified: Secondary | ICD-10-CM

## 2022-01-02 DIAGNOSIS — I10 Essential (primary) hypertension: Secondary | ICD-10-CM

## 2022-01-02 DIAGNOSIS — I4819 Other persistent atrial fibrillation: Secondary | ICD-10-CM | POA: Diagnosis not present

## 2022-01-02 DIAGNOSIS — E782 Mixed hyperlipidemia: Secondary | ICD-10-CM | POA: Diagnosis not present

## 2022-01-02 DIAGNOSIS — I700 Atherosclerosis of aorta: Secondary | ICD-10-CM

## 2022-01-02 DIAGNOSIS — D6869 Other thrombophilia: Secondary | ICD-10-CM

## 2022-01-02 LAB — COMPREHENSIVE METABOLIC PANEL
ALT: 15 U/L (ref 0–35)
AST: 14 U/L (ref 0–37)
Albumin: 4.3 g/dL (ref 3.5–5.2)
Alkaline Phosphatase: 72 U/L (ref 39–117)
BUN: 21 mg/dL (ref 6–23)
CO2: 29 mEq/L (ref 19–32)
Calcium: 9.8 mg/dL (ref 8.4–10.5)
Chloride: 102 mEq/L (ref 96–112)
Creatinine, Ser: 0.82 mg/dL (ref 0.40–1.20)
GFR: 71.58 mL/min (ref >60.00)
Glucose, Bld: 105 mg/dL — ABNORMAL HIGH (ref 70–99)
Potassium: 4.2 mEq/L (ref 3.5–5.1)
Sodium: 141 mEq/L (ref 135–145)
Total Bilirubin: 0.6 mg/dL (ref 0.2–1.2)
Total Protein: 6.5 g/dL (ref 6.0–8.3)

## 2022-01-02 LAB — CBC
HCT: 38.4 % (ref 36.0–46.0)
Hemoglobin: 12.6 g/dL (ref 12.0–15.0)
MCHC: 32.8 g/dL (ref 30.0–36.0)
MCV: 84.7 fl (ref 78.0–100.0)
Platelets: 205 10*3/uL (ref 150.0–400.0)
RBC: 4.53 Mil/uL (ref 3.87–5.11)
RDW: 15 % (ref 11.5–15.5)
WBC: 5.7 10*3/uL (ref 4.0–10.5)

## 2022-01-02 LAB — LIPID PANEL
Cholesterol: 227 mg/dL — ABNORMAL HIGH (ref 0–200)
HDL: 56.7 mg/dL (ref >39.00)
LDL Cholesterol: 139 mg/dL — ABNORMAL HIGH (ref 0–99)
NonHDL: 169.88
Total CHOL/HDL Ratio: 4
Triglycerides: 155 mg/dL — ABNORMAL HIGH (ref 0.0–149.0)
VLDL: 31 mg/dL (ref 0.0–40.0)

## 2022-01-02 LAB — TSH: TSH: 1.98 u[IU]/mL (ref 0.35–5.50)

## 2022-01-02 LAB — HEMOGLOBIN A1C: Hgb A1c MFr Bld: 6 % (ref 4.6–6.5)

## 2022-01-02 LAB — MAGNESIUM: Magnesium: 2 mg/dL (ref 1.5–2.5)

## 2022-01-02 NOTE — Progress Notes (Signed)
   Subjective:   Patient ID: Amy Williamson, female    DOB: [birth year], 71 y.o.   MRN: [id number]  HPI The patient is a 71 YO female coming in for follow up.   Review of Systems  Constitutional:  Positive for activity change. Negative for appetite change, chills, fatigue, fever and unexpected weight change.  Respiratory: Negative.    Cardiovascular:  Positive for palpitations.  Gastrointestinal: Negative.   Musculoskeletal:  Positive for arthralgias, back pain and myalgias. Negative for gait problem and joint swelling.  Skin: Negative.   Neurological: Negative.     Objective:  Physical Exam Constitutional:      Appearance: Normal appearance.  HENT:     Head: Normocephalic.  Cardiovascular:     Rate and Rhythm: Normal rate.  Pulmonary:     Effort: Pulmonary effort is normal.  Musculoskeletal:        General: Tenderness present. Normal range of motion.  Skin:    General: Skin is warm and dry.  Neurological:     General: No focal deficit present.     Mental Status: She is alert and oriented to person, place, and time.     Vitals:   01/02/22 0902  BP: 130/68  Pulse: 68  Resp: 18  SpO2: 97%  Weight: 216 lb (98 kg)  Height: 5' (1.524 m)    Assessment & Plan:

## 2022-01-02 NOTE — Patient Instructions (Signed)
Try 1/2 pill of the crestor (rosuvastatin) once a week. Let us know in 1-2 months how you are doing with this.

## 2022-01-05 ENCOUNTER — Encounter: Payer: Self-pay | Admitting: Internal Medicine

## 2022-01-05 NOTE — Assessment & Plan Note (Signed)
Checking HgA1c for screening.

## 2022-01-05 NOTE — Assessment & Plan Note (Signed)
Having severe back pain and checking CMP and CBC to ensure if there are any contraindications to treatment. She is seeing neurosurgery for options in the near future.

## 2022-01-05 NOTE — Assessment & Plan Note (Signed)
Not taking crestor due to side effects. We will try slow titration with 10 mg weekly crestor to start and increase monthly if tolerated.

## 2022-01-05 NOTE — Assessment & Plan Note (Signed)
BP at goal on diltiazem 240 mg daily. Checking CMP and adjust as needed.

## 2022-01-05 NOTE — Assessment & Plan Note (Signed)
Due to taking eliquis for her a fib. She denies blood in stool or nose bleeds. Checking CBC today.

## 2022-01-05 NOTE — Assessment & Plan Note (Signed)
Checking lipid panel and she is not taking medication. Asked her to start 1/2 pill (10 mg crestor) once a week. Call us in 1 month if no symptoms and we will change rx.

## 2022-01-05 NOTE — Assessment & Plan Note (Signed)
Checking TSH and CBC and CMP. She has had several episodes of A fib recently and needs assessment. Seeing cardiology for meds and getting ablation in the fall.

## 2022-01-08 ENCOUNTER — Encounter: Payer: Self-pay | Admitting: Internal Medicine

## 2022-01-08 DIAGNOSIS — R7303 Prediabetes: Secondary | ICD-10-CM

## 2022-01-09 NOTE — Telephone Encounter (Signed)
Pt is requesting referral to nutritionist for prediabetes

## 2022-01-10 ENCOUNTER — Ambulatory Visit (HOSPITAL_COMMUNITY): Payer: Medicare Other | Attending: Cardiology

## 2022-01-10 ENCOUNTER — Other Ambulatory Visit (HOSPITAL_COMMUNITY): Payer: Medicare Other

## 2022-01-10 DIAGNOSIS — I4819 Other persistent atrial fibrillation: Secondary | ICD-10-CM

## 2022-01-10 DIAGNOSIS — G4733 Obstructive sleep apnea (adult) (pediatric): Secondary | ICD-10-CM | POA: Insufficient documentation

## 2022-01-10 DIAGNOSIS — I10 Essential (primary) hypertension: Secondary | ICD-10-CM | POA: Diagnosis not present

## 2022-01-10 LAB — ECHOCARDIOGRAM COMPLETE
Area-P 1/2: 3.23 cm2
S' Lateral: 3 cm

## 2022-01-16 ENCOUNTER — Encounter (HOSPITAL_COMMUNITY): Payer: Self-pay | Admitting: Physician Assistant

## 2022-01-16 ENCOUNTER — Ambulatory Visit (HOSPITAL_COMMUNITY)
Admission: RE | Admit: 2022-01-16 | Discharge: 2022-01-16 | Disposition: A | Discharge status: Home / Self Care | Payer: Medicare Other | Source: Ambulatory Visit | Attending: Physician Assistant | Admitting: Physician Assistant

## 2022-01-16 VITALS — BP 136/68 | HR 65 | Ht 60.0 in | Wt 212.2 lb

## 2022-01-16 DIAGNOSIS — E785 Hyperlipidemia, unspecified: Secondary | ICD-10-CM | POA: Diagnosis not present

## 2022-01-16 DIAGNOSIS — Z6841 Body Mass Index (BMI) 40.0 and over, adult: Secondary | ICD-10-CM | POA: Diagnosis not present

## 2022-01-16 DIAGNOSIS — I10 Essential (primary) hypertension: Secondary | ICD-10-CM | POA: Insufficient documentation

## 2022-01-16 DIAGNOSIS — I4819 Other persistent atrial fibrillation: Secondary | ICD-10-CM | POA: Diagnosis present

## 2022-01-16 DIAGNOSIS — R600 Localized edema: Secondary | ICD-10-CM | POA: Diagnosis not present

## 2022-01-16 DIAGNOSIS — Z7901 Long term (current) use of anticoagulants: Secondary | ICD-10-CM | POA: Diagnosis not present

## 2022-01-16 DIAGNOSIS — D6869 Other thrombophilia: Secondary | ICD-10-CM

## 2022-01-16 DIAGNOSIS — Z79899 Other long term (current) drug therapy: Secondary | ICD-10-CM | POA: Insufficient documentation

## 2022-01-16 DIAGNOSIS — E669 Obesity, unspecified: Secondary | ICD-10-CM | POA: Insufficient documentation

## 2022-01-16 DIAGNOSIS — G4733 Obstructive sleep apnea (adult) (pediatric): Secondary | ICD-10-CM | POA: Diagnosis not present

## 2022-01-16 DIAGNOSIS — I700 Atherosclerosis of aorta: Secondary | ICD-10-CM | POA: Insufficient documentation

## 2022-01-22 ENCOUNTER — Other Ambulatory Visit: Payer: Self-pay | Admitting: Internal Medicine

## 2022-02-01 ENCOUNTER — Telehealth: Payer: Self-pay

## 2022-02-01 NOTE — Telephone Encounter (Signed)
   Pre-operative Risk Assessment    Patient Name: Amy Williamson  DOB: 01-08-1951 MRN: [id number]      Request for Surgical Clearance    Procedure:   L4-5 Lumbar Fusion   Date of Surgery:  Clearance TBD                                 Surgeon:  Earnie Larsson, MD Surgeon's Group or Practice Name:  [facility] Phone number:  [phone] Fax number:  [phone]   Type of Clearance Requested:   - Medical  - Pharmacy:  Hold Apixaban (Eliquis)     Type of Anesthesia:  General    Additional requests/questions:   None  Signed, Aditi Rovira   02/01/2022, 8:16 AM

## 2022-02-05 ENCOUNTER — Other Ambulatory Visit: Payer: Self-pay | Admitting: Internal Medicine

## 2022-02-05 NOTE — Telephone Encounter (Signed)
Patient with diagnosis of afib on Eliquis for anticoagulation.    Procedure: 4-5 lumbar fusion Date of procedure: TBD  CHA2DS2-VASc Score = 4  This indicates a 4.8% annual risk of stroke. The patient's score is based upon: CHF History: 0 HTN History: 1 Diabetes History: 0 Stroke History: 0 Vascular Disease History: 1 Age Score: 1 Gender Score: 1   CrCl 33m/min using adjusted body weight due to obesity Platelet count 205K  Pt is pending afib ablation on 03/29/22 and cannot interrupt anticoagulation for 3 weeks prior to and 3 months post ablation. As long as spinal fusion is scheduled outside of this time frame, pt can hold Eliquis for 3 days prior to procedure.    **This guidance is not considered finalized until pre-operative APP has relayed final recommendations.**

## 2022-02-06 ENCOUNTER — Other Ambulatory Visit: Payer: Self-pay | Admitting: Neurosurgery

## 2022-02-06 NOTE — Telephone Encounter (Signed)
   Patient Name: Amy Williamson  DOB: 1950-11-17 MRN: [id number]  Primary Cardiologist: None  Chart reviewed as part of pre-operative protocol coverage. Given past medical history and time since last visit, based on ACC/AHA guidelines, [name] would be at acceptable risk for the planned procedure without further cardiovascular testing. Agree with Pharmacist recommendations.   The patient was doing well when seen in afib clinic 01/16/22.   I will route this recommendation to the requesting party via Epic fax function and remove from pre-op pool.  Please call with questions.  [place], [place] 02/06/2022, 8:14 AM

## 2022-02-08 NOTE — Progress Notes (Signed)
Cardiology Office Note:    Date:  02/09/2022   ID:  Amy Williamson, DOB [birth year], MRN [id number]  PCP:  Hoyt Koch, MD  [facility] Cardiologist:  Rudean Haskell MD [facility] Electrophysiologist:  Vickie Epley, MD   CC: Atrial Fibrillation Follow up  History of Present Illness:    Amy Williamson is a 71 y.o. female with a hx of new atrial fibrillation, with HTN, Aortic Atherosclerosis and HLD, Morbid Obesity, history of dysphagia who sees ([facility]); prior lumpectomy in 06/09/2020 who presented for evaluation 07/08/20. 2022: Had DCCV.  Saw Dr. Radford Pax for OSA. 2023: Had multiple DCCVs and started flecainide (if needed).  AF ablation scheduled for 9/23  Patient notes that that she has been through a lot.   Her multiple AF episode seem to be triggered by back injections: she has surgery 02/19/22.[name] Has not needed her PRN flecainide in the month of July. She is going to need to stop [facility] 02/16/22. She has AF ablation scheduled for 03/29/22 and will need to be on un interrupted AC prior.  No chest pain or pressure .  No SOB/DOE and no PND/Orthopnea.  No weight gain or leg swelling.   She notes no futher bleeding issues since a vaginal polyp was removed.  She does not easy bruising.  This and the financial burdens of DOAC lead to the consideration of LAA-O.  Has new rash after tramadol start   Past Medical History:  Diagnosis Date   Anticoagulant long-term use    eliquis --- managed by cardiology   Arthritis    BACK AND SHOULDERS   Depressive disorder, not elsewhere classified    Essential hypertension    followed by pcp and cardiology   Frequency of urination    GERD (gastroesophageal reflux disease)    History of dysphagia 2015   s/p egd w/ dilation, pt did not have stricture   History of gastritis    OSA (obstructive sleep apnea)    followed by dr t. turner---  study in epic 09-03-2020 moderate osa ,  pt scheduled for cpap titration 12-08-2020    PAF (paroxysmal atrial fibrillation) [facility])    cardiologist-- [name]---- new onset 06-30-2020 at pcp office visit,  s/p DCCV 07-28-2020 successful   Palpitations 06/2020   11-16-2020  per pt denies palpitations since DCCV 07-28-2020   Plantar fasciitis 08/2017   left foot    Pure hypercholesterolemia     Past Surgical History:  Procedure Laterality Date   BREAST LUMPECTOMY WITH RADIOACTIVE SEED LOCALIZATION Left 06/09/2020   Procedure: LEFT BREAST LUMPECTOMY WITH RADIOACTIVE SEED LOCALIZATION;  Surgeon: Coralie Keens, MD;  Location: [place];  Service: General;  Laterality: Left;  LMA   CARDIOVERSION N/A 07/28/2020   Procedure: CARDIOVERSION;  Surgeon: Werner Lean, MD;  Location: [facility];  Service: Cardiovascular;  Laterality: N/A;   CARPAL TUNNEL RELEASE Bilateral right 2004;  left 2006   CATARACT EXTRACTION W/ INTRAOCULAR LENS  IMPLANT, BILATERAL  2016   CESAREAN SECTION  [id number]   BILATERAL TUBAL LIGATION W/ LAST C/S   COLONOSCOPY WITH PROPOFOL  last one 08/ 2013   [place] N/A 11/22/2020   Procedure: [place];  Surgeon: Megan Salon, MD;  Location: [place];  Service: Gynecology;  Laterality: N/A;   EAR CYST EXCISION  08/03/2011   patient states this is incorrect   ESOPHAGOGASTRODUODENOSCOPY (EGD) WITH ESOPHAGEAL DILATION  08/2013   KNEE  ARTHROSCOPY W/ MENISCAL REPAIR Left 08-03-2011  '@[facility]'$    and excision baker's cyst   PULLEY RELEASE RIGHT TRIGGER FINGER Right 01/08/2011   TRIGGER FINGER RELEASE Bilateral left 05/06/14;  right  02-29-2014    Current Medications: Current Meds  Medication Sig   acetaminophen (TYLENOL) 650 MG CR tablet Take 650 mg by mouth in the morning and at bedtime.   diltiazem (CARDIZEM CD) 240 MG 24 hr capsule TAKE 1 CAPSULE BY MOUTH EVERY DAY   ELIQUIS 5 MG TABS tablet TAKE 1 TABLET BY MOUTH TWICE A DAY    flecainide (TAMBOCOR) 50 MG tablet Take 0.5 tablets (25 mg total) by mouth 2 (two) times daily as needed (breakthrough afib).   fluticasone (FLONASE) 50 MCG/ACT nasal spray Place 2 sprays into both nostrils daily as needed for allergies or rhinitis.   omeprazole (PRILOSEC) 20 MG capsule TAKE 1 CAPSULE BY MOUTH EVERY DAY   rosuvastatin (CRESTOR) 20 MG tablet TAKE 1 TABLET BY MOUTH EVERY DAY   traZODone (DESYREL) 50 MG tablet TAKE 1/2 TO 1 TABLET BY MOUTH AT BEDTIME AS NEEDED FOR SLEEP     Allergies:   Tramadol, Atenolol, Avelox [moxifloxacin hcl in nacl], Azithromycin, Doxycycline, Myrbetriq [mirabegron], Naproxen, and Oxybutynin   Social History   Socioeconomic History   Marital status: Married    Spouse name: david   Number of children: 2   Years of education: Not on file   Highest education level: Not on file  Occupational History   Occupation: retired  Tobacco Use   Smoking status: Never   Smokeless tobacco: Never   Tobacco comments:    Never smoke 10/11/21  Vaping Use   Vaping Use: Never used  Substance and Sexual Activity   Alcohol use: No   Drug use: Never   Sexual activity: Not on file    Comment: BTL  Other Topics Concern   Not on file  Social History Narrative   Not on file   Social Determinants of Health   Financial Resource Strain: Low Risk  (06/30/2021)   Overall Financial Resource Strain (CARDIA)    Difficulty of Paying Living Expenses: Not hard at all  Food Insecurity: No Food Insecurity (06/30/2021)   Hunger Vital Sign    Worried About Running Out of Food in the Last Year: Never true    Ran Out of Food in the Last Year: Never true  Transportation Needs: No Transportation Needs (06/30/2021)   PRAPARE - Hydrologist (Medical): No    Lack of Transportation (Non-Medical): No  Physical Activity: Inactive (06/30/2021)   Exercise Vital Sign    Days of Exercise per Week: 0 days    Minutes of Exercise per Session: 0 min  Stress: No  Stress Concern Present (06/30/2021)   [place]    Feeling of Stress : Not at all  Social Connections: Moderately Integrated (06/30/2021)   Social Connection and Isolation Panel [NHANES]    Frequency of Communication with Friends and Family: Twice a week    Frequency of Social Gatherings with Friends and Family: Twice a week    Attends Religious Services: More than 4 times per year    Active Member of [employer] or Organizations: No    Attends Archivist Meetings: Never    Marital Status: Married    Social:  I take care of her nephew, her husband comes to some visits.  Family History: The patient's family history includes Clotting  disorder in her father; Heart disease in her father; Heart failure in her mother; Hyperlipidemia in her mother; Hypertension in her mother; Liver cancer in her brother; Liver disease in her maternal aunt; Osteoporosis in her mother. There is no history of Colon cancer. History of coronary artery disease notable for no members. History of heart failure notable for mother. No history of cardiomyopathies including hypertrophic cardiomyopathy, left ventricular non-compaction, or arrhythmogenic right ventricular cardiomyopathy. History of arrhythmia notable for AF in mother. Denies family history of sudden cardiac death including drowning, car accidents, or unexplained deaths in the family. No history of bicuspid aortic valve or aortic aneurysm or dissection.  ROS:   Please see the history of present illness.     All other systems reviewed and are negative.  EKGs/Labs/Other Studies Reviewed:    The following studies were reviewed today:  EKG:  02/09/22: SR 1st HB LVH 09/13/2020: SR rate 61 WNL 06/30/20:  A fib 70   Transthoracic Echocardiogram: Date: 08/10/2020 Results: IMPRESSIONS   1. Left ventricular ejection fraction, by estimation, is 60 to 65%. The  left ventricle has normal  function. The left ventricle has no regional  wall motion abnormalities. Left ventricular diastolic parameters are  consistent with Grade II diastolic  dysfunction (pseudonormalization). Elevated left ventricular end-diastolic  pressure.   2. Right ventricular systolic function is normal. The right ventricular  size is normal.   3. The mitral valve is normal in structure. Trivial mitral valve  regurgitation. No evidence of mitral stenosis.   4. The aortic valve is tricuspid. Aortic valve regurgitation is not  visualized. No aortic stenosis is present.   5. The inferior vena cava is normal in size with greater than 50%  respiratory variability, suggesting right atrial pressure of 3 mmHg.   NonCardiac CT: Date: 09/23/2013 Results: No coronary calcification; Aortic Arch Calcifications  Recent Labs: 09/26/2021: B Natriuretic Peptide 200.5 01/02/2022: ALT 15; BUN 21; Creatinine, Ser 0.82; Hemoglobin 12.6; Magnesium 2.0; Platelets 205.0; Potassium 4.2; Sodium 141; TSH 1.98  Recent Lipid Panel    Component Value Date/Time   CHOL 227 (H) 01/02/2022 0936   CHOL 155 05/09/2021 0732   TRIG 155.0 (H) 01/02/2022 0936   TRIG 54 06/25/2006 0725   HDL 56.70 01/02/2022 0936   HDL 62 05/09/2021 0732   CHOLHDL 4 01/02/2022 0936   VLDL 31.0 01/02/2022 0936   LDLCALC 139 (H) 01/02/2022 0936   LDLCALC 72 05/09/2021 0732   LDLDIRECT 142.3 03/16/2013 0732    Risk Assessment/Calculations:     CHA2DS2-VASc Score = 3  This indicates a 3.2% annual risk of stroke. The patient's score is based upon: CHF History: 0 HTN History: 1 Diabetes History: 0 Stroke History: 0 Vascular Disease History: 0 Age Score: 1 Gender Score: 1      Physical Exam:    VS:  BP (!) 128/58   Pulse 64   Ht 5' (1.524 m)   Wt 206 lb (93.4 kg)   LMP 07/23/2004   SpO2 96%   BMI 40.23 kg/m     Wt Readings from Last 3 Encounters:  02/09/22 206 lb (93.4 kg)  01/16/22 212 lb 3.2 oz (96.3 kg)  01/02/22 216 lb (98 kg)     Gen: no distress, morbid obesity   Neck: No JVD Cardiac: No Rubs or Gallops, no murmur, RRR +2 radial pulses Respiratory: Clear to auscultation bilaterally, normal effort, normal  respiratory rate GI: Soft, nontender, non-distended  MS: No  edema;  moves all extremities Integument: macular  red rash on left lower back.  Neuro:  At time of evaluation, alert and oriented to person/place/time/situation  Psych: Normal affect, patient feels OK   ASSESSMENT:    1. Paroxysmal atrial fibrillation (HCC)   2. Preop cardiovascular exam   3. OSA (obstructive sleep apnea)   4. Morbid obesity ([place])   5. Essential hypertension     PLAN:    Cardiac risk stratification for back surgery - recent negative stress test and normal echo and no symptoms, reasonable to proceed without additional testing - planned for 3 day stop of DOAC prior to surgery - we are available for post operative AF   Persistent Atrial Fibrillation s/p Cardioversion on Flecaidine with consideration of ablation and Watchman OSA - with issues with mouthguard Morbid Obesity HTN- BP at goal - CHADSVASC=3, HASBLED 3. - TSH 2.22 - Continue anticoagulation with Eliquis  - continue diltiazem and PRN flecainide  Timing will be difficulty: she will need at least 30 days of uninterrupted AC prior to her 03/29/22 ablation, will need return to [facility] post back surgery when Dr. Annette Stable and team feel it is safe Presently planned for  [facility] stop 7/28, surgery 7/31, AF ablation 03/29/22, consideration for Watchman and pre planning CT, then Watchman FLX and likely post planning CT   Aortic Atherosclerosis and HLD - will work on diet changes and we will discuss more aggressive statin dosing after her various surgery and procedures  [facility] Referral for Left Atrial Appendage Closure with Non-Valvular Atrial Fibrillation   Amy Williamson is a 71 y.o. female is being referred to the [facility] Team for evaluation  for Left Atrial Appendage Closure with Watchman device for the management of stroke risk resulting form non-valvular atrial fibrillation.    Base upon Ms. Starn history, she is felt to be a poor candidate for long-term anticoagulation because of  easy bruising and financially inabillty to take long term AC .  The patient has a HAS-BLED score of 3 indicating a Yearly Major Bleeding Risk of 3.74%.      Her CHADS2-VASc Score is 3 with an unadjusted Ischemic Stroke Rate (% per year) of 3.2%.    Her stroke risk necessitates a strategy of stroke prevention with either long-term oral anticoagulation or left atrial appendage occlusion therapy. We have discussed their bleeding risk in the context of their comorbid medical problems, as well as the rationale for referral for evaluation of Watchman left atrial appendage occlusion therapy. While the patient is at high long-term bleeding risk, they may be appropriate for short-term anticoagulation. Based on this individual patient's stroke and bleeding risk, a shared decision has been made to refer the patient for consideration of Watchman left atrial appendage closure utilizing the [employer] of Cardiology shared decision tool.   Time Spent Directly with Patient:   I have spent a total of 40 minutes with the patient reviewing notes, imaging, EKGs, labs and examining the patient as well as establishing an assessment and plan that was discussed personally with the patient.  > 50% of time was spent in direct patient care and family and reviewing imaging with patient (prior Chest CT for LAA discussions).    Medication Adjustments/Labs and Tests Ordered: Current medicines are reviewed at length with the patient today.  Concerns regarding medicines are outlined above.  Orders Placed This Encounter  Procedures   EKG 12-Lead   No orders of the defined types were placed in this encounter.  Patient Instructions  Medication Instructions:   Your physician recommends that you continue on your current medications as directed. Please refer to the Current Medication list given to you today.  *If you need a refill on your cardiac medications before your next appointment, please call your pharmacy*   Lab Work: NONE If you have labs (blood work) drawn today and your tests are completely normal, you will receive your results only by: [place] (if you have MyChart) OR A paper copy in the mail If you have any lab test that is abnormal or we need to change your treatment, we will call you to review the results.   Testing/Procedures: NONE   Follow-Up: At [facility], you and your health needs are our priority.  As part of our continuing mission to provide you with exceptional heart care, we have created designated Provider Care Teams.  These Care Teams include your primary Cardiologist (physician) and Advanced Practice Providers (APPs -  Physician Assistants and Nurse Practitioners) who all work together to provide you with the care you need, when you need it.   Your next appointment:   5 month(s)  The format for your next appointment:   In Person  Provider:   Rudean Haskell, MD  Important Information About Sugar         Signed, Werner Lean, MD  02/09/2022 10:10 AM    [place]

## 2022-02-08 NOTE — Pre-Procedure Instructions (Signed)
Surgical Instructions    Your procedure is scheduled on Monday, July 31.  Report to [facility] Main Entrance "A" at 7:50 A.M., then check in with the Admitting office.  Call this number if you have problems the morning of surgery:  [phone]   If you have any questions prior to your surgery date call [phone]: Open Monday-Friday 8am-4pm    Remember:  Do not eat after midnight the night before your surgery  You may drink clear liquids until 6:50AM the morning of your surgery.   Clear liquids allowed are: Water, Non-Citrus Juices (without pulp), Carbonated Beverages, Clear Tea, Black Coffee ONLY (NO MILK, CREAM OR POWDERED CREAMER of any kind), and Gatorade    Take these medicines the morning of surgery with A SIP OF WATER:  diltiazem (CARDIZEM CD)  omeprazole (PRILOSEC) rosuvastatin (CRESTOR)  flecainide (TAMBOCOR) if needed fluticasone (FLONASE) if needed traMADol (ULTRAM) if needed  Follow your surgeon's instructions on when to stop Eliquis.  If no instructions were given by your surgeon then you will need to call the office to get those instructions.     As of today, STOP taking any Aspirin (unless otherwise instructed by your surgeon) Aleve, Naproxen, Ibuprofen, Motrin, Advil, Goody's, BC's, all herbal medications, fish oil, and all vitamins.  [facility] is not responsible for any belongings or valuables. .   Do NOT Smoke (Tobacco/Vaping)  24 hours prior to your procedure  If you use a CPAP at night, you may bring your mask for your overnight stay.   Contacts, glasses, hearing aids, dentures or partials may not be worn into surgery, please bring cases for these belongings   For patients admitted to the hospital, discharge time will be determined by your treatment team.   Patients discharged the day of surgery will not be allowed to drive home, and someone needs to stay with them for 24 hours.   SURGICAL WAITING ROOM VISITATION Patients having surgery or a  procedure may have no more than 2 support people in the waiting area - these visitors may rotate.   Children under the age of 83 must have an adult with them who is not the patient. If the patient needs to stay at the hospital during part of their recovery, the visitor guidelines for inpatient rooms apply. Pre-op nurse will coordinate an appropriate time for 1 support person to accompany patient in pre-op.  This support person may not rotate.   Please refer to the [facility] website for the visitor guidelines for Inpatients (after your surgery is over and you are in a regular room).    Special instructions:    Oral Hygiene is also important to reduce your risk of infection.  Remember - BRUSH YOUR TEETH THE MORNING OF SURGERY WITH YOUR REGULAR TOOTHPASTE   [facility]- Preparing For Surgery  Before surgery, you can play an important role. Because skin is not sterile, your skin needs to be as free of germs as possible. You can reduce the number of germs on your skin by washing with CHG (chlorahexidine gluconate) Soap before surgery.  CHG is an antiseptic cleaner which kills germs and bonds with the skin to continue killing germs even after washing.     Please do not use if you have an allergy to CHG or antibacterial soaps. If your skin becomes reddened/irritated stop using the CHG.  Do not shave (including legs and underarms) for at least 48 hours prior to first CHG shower. It is OK to shave your face.  Please follow these instructions carefully.     Shower the NIGHT BEFORE SURGERY and the MORNING OF SURGERY with CHG Soap.   If you chose to wash your hair, wash your hair first as usual with your normal shampoo. After you shampoo, rinse your hair and body thoroughly to remove the shampoo.  Then [employer] and genitals (private parts) with your normal soap and rinse thoroughly to remove soap.  After that Use CHG Soap as you would any other liquid soap. You can apply CHG directly to the skin and  wash gently with a scrungie or a clean washcloth.   Apply the CHG Soap to your body ONLY FROM THE NECK DOWN.  Do not use on open wounds or open sores. Avoid contact with your eyes, ears, mouth and genitals (private parts). Wash Face and genitals (private parts)  with your normal soap.   Wash thoroughly, paying special attention to the area where your surgery will be performed.  Thoroughly rinse your body with warm water from the neck down.  DO NOT shower/wash with your normal soap after using and rinsing off the CHG Soap.  Pat yourself dry with a CLEAN TOWEL.  Wear CLEAN PAJAMAS to bed the night before surgery  Place CLEAN SHEETS on your bed the night before your surgery  DO NOT SLEEP WITH PETS.   Day of Surgery:  Take a shower with CHG soap. Wear Clean/Comfortable clothing the morning of surgery           Do not wear jewelry or makeup Do not wear lotions, powders, perfumes/colognes, or deodorant. Do not shave 48 hours prior to surgery.  Men may shave face and neck. Do not bring valuables to the hospital. Do not wear nail polish, gel polish, artificial nails, or any other type of covering on natural nails (fingers and toes) If you have artificial nails or gel coating that need to be removed by a nail salon, please have this removed prior to surgery. Artificial nails or gel coating may interfere with anesthesia's ability to adequately monitor your vital signs. Remember to brush your teeth WITH YOUR REGULAR TOOTHPASTE.    If you received a COVID test during your pre-op visit, it is requested that you wear a mask when out in public, stay away from anyone that may not be feeling well, and notify your surgeon if you develop symptoms. If you have been in contact with anyone that has tested positive in the last 10 days, please notify your surgeon.    Please read over the following fact sheets that you were given.

## 2022-02-09 ENCOUNTER — Encounter (HOSPITAL_COMMUNITY)
Admission: RE | Admit: 2022-02-09 | Discharge: 2022-02-09 | Disposition: A | Discharge status: Home / Self Care | Payer: Medicare Other | Source: Ambulatory Visit | Attending: Neurosurgery | Admitting: Neurosurgery

## 2022-02-09 ENCOUNTER — Encounter: Payer: Self-pay | Admitting: Internal Medicine

## 2022-02-09 ENCOUNTER — Ambulatory Visit: Payer: Medicare Other | Admitting: Internal Medicine

## 2022-02-09 ENCOUNTER — Encounter (HOSPITAL_COMMUNITY): Payer: Self-pay

## 2022-02-09 ENCOUNTER — Other Ambulatory Visit: Payer: Self-pay

## 2022-02-09 VITALS — BP 128/58 | HR 64 | Ht 60.0 in | Wt 206.0 lb

## 2022-02-09 VITALS — BP 138/57 | HR 69 | Temp 98.3°F | Resp 17 | Ht 60.0 in | Wt 206.6 lb

## 2022-02-09 DIAGNOSIS — Z01810 Encounter for preprocedural cardiovascular examination: Secondary | ICD-10-CM

## 2022-02-09 DIAGNOSIS — G4733 Obstructive sleep apnea (adult) (pediatric): Secondary | ICD-10-CM

## 2022-02-09 DIAGNOSIS — I10 Essential (primary) hypertension: Secondary | ICD-10-CM

## 2022-02-09 DIAGNOSIS — I480 Paroxysmal atrial fibrillation: Secondary | ICD-10-CM

## 2022-02-09 DIAGNOSIS — I4819 Other persistent atrial fibrillation: Secondary | ICD-10-CM

## 2022-02-09 DIAGNOSIS — Z01812 Encounter for preprocedural laboratory examination: Secondary | ICD-10-CM | POA: Diagnosis not present

## 2022-02-09 DIAGNOSIS — Z01818 Encounter for other preprocedural examination: Secondary | ICD-10-CM

## 2022-02-09 LAB — TYPE AND SCREEN
ABO/RH(D): O POS
Antibody Screen: NEGATIVE

## 2022-02-09 LAB — CBC
HCT: 40.5 % (ref 36.0–46.0)
Hemoglobin: 13 g/dL (ref 12.0–15.0)
MCH: 27.7 pg (ref 26.0–34.0)
MCHC: 32.1 g/dL (ref 30.0–36.0)
MCV: 86.2 fL (ref 80.0–100.0)
Platelets: 184 10*3/uL (ref 150–400)
RBC: 4.7 MIL/uL (ref 3.87–5.11)
RDW: 14.1 % (ref 11.5–15.5)
WBC: 6.1 10*3/uL (ref 4.0–10.5)
nRBC: 0 % (ref 0.0–0.2)

## 2022-02-09 LAB — BASIC METABOLIC PANEL
Anion gap: 11 (ref 5–15)
BUN: 21 mg/dL (ref 8–23)
CO2: 23 mmol/L (ref 22–32)
Calcium: 9.3 mg/dL (ref 8.9–10.3)
Chloride: 104 mmol/L (ref 98–111)
Creatinine, Ser: 0.83 mg/dL (ref 0.44–1.00)
GFR, Estimated: >60 mL/min (ref >60)
Glucose, Bld: 123 mg/dL — ABNORMAL HIGH (ref 70–99)
Potassium: 4.4 mmol/L (ref 3.5–5.1)
Sodium: 138 mmol/L (ref 135–145)

## 2022-02-09 NOTE — Progress Notes (Addendum)
PCP - Pricilla Holm Cardiologist - Dr. Gasper Sells  PPM/ICD - Denies Device Orders -  Rep Notified -   Chest x-ray - 09/26/21 EKG - 02/09/22 per patient done at cardiologists office and 01/16/22 Stress Test - 10/19/21 ECHO - 01/10/22 Cardiac Cath - Denies  Sleep Study - Yes has OSA CPAP - Could not tolerate but uses a mask device  DM  - Denies  Blood Thinner Instructions: Eliquis Per Dr. Annette Stable will stop on 27th LD 27th Aspirin Instructions: Denies  ERAS Protcol -No  Anesthesia review:  Yes cardiac history  Patient denies shortness of breath, fever, cough and chest pain at PAT appointment   All instructions explained to the patient, with a verbal understanding of the material. Patient agrees to go over the instructions while at home for a better understanding.  The opportunity to ask questions was provided.

## 2022-02-09 NOTE — Progress Notes (Addendum)
Updated patient instructions for surgery to no food or drink at midnight the night prior to surgery.   1440 CAlled lab regarding surgical PCR.  Lab does not have, will need to recollect on day of surgery.

## 2022-02-09 NOTE — Patient Instructions (Signed)
Medication Instructions:  Your physician recommends that you continue on your current medications as directed. Please refer to the Current Medication list given to you today.  *If you need a refill on your cardiac medications before your next appointment, please call your pharmacy*   Lab Work: NONE If you have labs (blood work) drawn today and your tests are completely normal, you will receive your results only by: [place] (if you have MyChart) OR A paper copy in the mail If you have any lab test that is abnormal or we need to change your treatment, we will call you to review the results.   Testing/Procedures: NONE   Follow-Up: At [facility], you and your health needs are our priority.  As part of our continuing mission to provide you with exceptional heart care, we have created designated Provider Care Teams.  These Care Teams include your primary Cardiologist (physician) and Advanced Practice Providers (APPs -  Physician Assistants and Nurse Practitioners) who all work together to provide you with the care you need, when you need it.   Your next appointment:   5 month(s)  The format for your next appointment:   In Person  Provider:   Rudean Haskell, MD  Important Information About Sugar

## 2022-02-12 ENCOUNTER — Ambulatory Visit
Admission: EM | Admit: 2022-02-12 | Discharge: 2022-02-12 | Disposition: A | Discharge status: Home / Self Care | Payer: Medicare Other | Attending: Internal Medicine | Admitting: Internal Medicine

## 2022-02-12 DIAGNOSIS — B029 Zoster without complications: Secondary | ICD-10-CM | POA: Diagnosis not present

## 2022-02-12 MED ORDER — VALACYCLOVIR HCL 1 G PO TABS: 1000.0000 mg | ORAL_TABLET | Freq: Three times a day (TID) | ORAL | 0 refills | Status: DC

## 2022-02-12 NOTE — Progress Notes (Signed)
Anesthesia Chart Review:   Case: [id number] Date/Time: 02/19/22 0936   Procedure: PLIF - L4-L5 (Back)   Anesthesia type: General   Pre-op diagnosis: Spondylolisthesis   Location: [facility] OR ROOM 68 / [place] [facility]   Surgeons: Earnie Larsson, MD       DISCUSSION: Patient is a 71 year old female scheduled for the above procedure.  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   She was seen at a [facility] on 02/12/22 for a rash to her lower back which she initially attributed to newly taking tramadol for back pain. Notes suggest she notified her spine specialist who then prescribed prednisone. She also took Benadryl but with no improvement. On exam she had a "Vesicular and erythematous rash present to left lower back that appears to follow a dermatome.  All lesions appear intact and no drainage noted." She was diagnosed with herpes zoster without complication and prescribed Valtrex TID x 7 days. She was advised by NP to update her surgeon, and I also let Lorriane Shire know at Dr. Marchelle Folks office to review to consider if case needs to be rescheduled given rash location (lower back) and timing (due to recent Shingles diagnosis).   She was last evaluated by cardiologist Dr. Whitney Post on 02/09/22. He wrote,  " Cardiac risk stratification for back surgery - recent negative stress test and normal echo and no symptoms, reasonable to proceed without additional testing - planned for 3 day stop of DOAC prior to surgery - we are available for post operative AF" She is tentatively scheduled for afib ablation on 03/29/22 and is also being considered for Watchman device. He added, "she will need at least 30 days of uninterrupted AC prior to her 03/29/22 ablation, will need return to [facility] post back surgery when Dr. Annette Stable and team feel  it is safe." (Pharmacist had also noted on 02/05/22 that anticoagulation cannot be interrupted for at least 3 weeks prior to and 3 months post-ablation.) Continue anticoagulation with Eliquis. Continue diltiazem and as needed flecainide (did not tolerate flecainide BID due to side effects). Reported last Eliquis is scheduled for 02/15/22.   Recently diagnosed with Herpes zoster involving the lower back and started on Valtrex 02/12/22. Awaiting review by surgeon.  ADDENDUM 02/15/22 3:51 PM: Received follow-up from Lorriane Shire at Dr. Marchelle Folks office. He plans to keep patient's surgery as scheduled and will re-evaluate her on the day of surgery. Surgical Short Stay director notified. Patient will be placed in a negative pressure room on arrival until confirmed that lesions are drying with scab formation or clearing.    VS: BP (!) 138/57   Pulse 69   Temp 36.8 C (Oral)   Resp 17   Ht 5' (1.524 m)   Wt 93.7 kg   LMP 07/23/2004   SpO2 97%   BMI 40.35 kg/m    PROVIDERS: Hoyt Koch, MD is PCP  Rudean Haskell, MD is cardiologist Lars Mage, MD is EP cardiologist. Seen by Malka So, PA-C on 6/127/23 at the [facility]. Loreta Ave, MD is cardiologist (OSA) Christinia Gully, MD is pulmonologist   LABS: Labs reviewed: Acceptable for surgery. LFTs normal and A1c 6.0% 01/02/22.  (all labs ordered are listed, but only abnormal results are displayed)  Labs Reviewed  BASIC METABOLIC PANEL - Abnormal; Notable for the following components:      Result Value   Glucose,  Bld 123 (*)    All other components within normal limits  SURGICAL PCR SCREEN  CBC  TYPE AND SCREEN    OTHER: PFTs 09/18/21: FVC 1.68 (67%), post 1.63 (65%). FEV1 1.48 (79%), post 1.49 (79%). FEV1/FVC ratio 88% (116%), post 91%. DLCO unc 18.00 (106%), cor 18.34 (108%).  CPAP Titration Study 12/08/20: IMPRESSIONS - The optimal PAP pressure was 10 cm of water. - Central sleep apnea was not noted during this  titration (CAI = 0.3/h). - Moderate oxygen desaturations were observed during this titration (min O2 = 84.0%). - No snoring was audible during this study. - No cardiac abnormalities were observed during this study. - Clinically significant periodic limb movements were not noted during this study. Arousals associated with PLMs were rare.   Sleep Study/NPSG 09/03/20: IMPRESSIONS - Moderate obstructive sleep apnea occurred during this study (AHI = 22.1/h). - No significant central sleep apnea occurred during this study (CAI = 0.0/h). - Severe oxygen desaturation was noted during this study (Min O2 = 70.0%). - The patient snored with loud snoring volume. - PACs were noted during this study. - Clinically significant periodic limb movements did not occur during sleep. No significant associated arousals.   IMAGES: MRI L-spine 01/09/22 (Canopy/PACS): IMPRESSION: Multilevel degenerative changes as detailed above. There is increased canal stenosis at L2-L3. Otherwise, stenosis remains greatest at L4-L5.   CXR 09/26/21: FINDINGS: Slightly prominent central vascular structures are stable. Heart size is upper limits of normal and stable. No focal airspace disease or overt pulmonary edema. No large pleural effusions. Atherosclerotic calcifications at the aortic arch. Negative for a pneumothorax. No acute bone abnormality. IMPRESSION: No active cardiopulmonary disease.    EKG: 02/09/22: Normal sinus rhythm.  First-degree AV block.  LVH.  Poor R wave progression.   CV: Echo 01/10/22: IMPRESSIONS   1. Left ventricular ejection fraction, by estimation, is 60 to 65%. The  left ventricle has normal function. The left ventricle has no regional  wall motion abnormalities. Left ventricular diastolic parameters are  indeterminate.   2. Right ventricular systolic function is normal. The right ventricular  size is normal. There is normal pulmonary artery systolic pressure.   3. Left atrial size was  mildly dilated.   4. The mitral valve is normal in structure. Mild mitral valve  regurgitation. No evidence of mitral stenosis.   5. The aortic valve is grossly normal. Aortic valve regurgitation is not  visualized. No aortic stenosis is present.   6. The inferior vena cava is normal in size with greater than 50%  respiratory variability, suggesting right atrial pressure of 3 mmHg.  - Comparison(s): No significant change from prior study.  - Conclusion(s)/Recommendation(s): Otherwise normal echocardiogram, with  minor abnormalities described in the report.   Nuclear stress test 10/19/21:   The study is normal. The study is low risk.   No ST deviation was noted.   LV perfusion is normal.   Left ventricular function is normal. Nuclear stress EF: 69 %. The left ventricular ejection fraction is hyperdynamic (>65%). End diastolic cavity size is normal.   Prior study not available for comparison.   Past Medical History:  Diagnosis Date   Anticoagulant long-term use    eliquis --- managed by cardiology   Arthritis    BACK AND SHOULDERS   Depressive disorder, not elsewhere classified    Essential hypertension    followed by pcp and cardiology   Frequency of urination    GERD (gastroesophageal reflux disease)    History of dysphagia  2015   s/p egd w/ dilation, pt did not have stricture   History of gastritis    OSA (obstructive sleep apnea)    followed by dr t. turner---  study in epic 09-03-2020 moderate osa ,  pt scheduled for cpap titration 12-08-2020   PAF (paroxysmal atrial fibrillation) [facility])    cardiologist-- [name]---- new onset 06-30-2020 at pcp office visit,  s/p DCCV 07-28-2020 successful   Palpitations 06/2020   11-16-2020  per pt denies palpitations since DCCV 07-28-2020   Plantar fasciitis 08/2017   left foot    Pure hypercholesterolemia     Past Surgical History:  Procedure Laterality Date   BREAST LUMPECTOMY WITH RADIOACTIVE SEED LOCALIZATION Left  06/09/2020   Procedure: LEFT BREAST LUMPECTOMY WITH RADIOACTIVE SEED LOCALIZATION;  Surgeon: Coralie Keens, MD;  Location: [facility];  Service: General;  Laterality: Left;  LMA   CARDIOVERSION N/A 07/28/2020   Procedure: CARDIOVERSION;  Surgeon: Werner Lean, MD;  Location: [facility];  Service: Cardiovascular;  Laterality: N/A;   CARPAL TUNNEL RELEASE Bilateral right 2004;  left 2006   CATARACT EXTRACTION W/ INTRAOCULAR LENS  IMPLANT, BILATERAL  2016   CESAREAN SECTION  [id number]   BILATERAL TUBAL LIGATION W/ LAST C/S   COLONOSCOPY WITH PROPOFOL  last one 08/ 2013   [place] N/A 11/22/2020   Procedure: [place];  Surgeon: Megan Salon, MD;  Location: [place];  Service: Gynecology;  Laterality: N/A;   EAR CYST EXCISION  08/03/2011   patient states this is incorrect was a Baker's cyst popliteal area   ESOPHAGOGASTRODUODENOSCOPY (EGD) WITH ESOPHAGEAL DILATION  08/2013   KNEE ARTHROSCOPY W/ MENISCAL REPAIR Left 08-03-2011  '@[facility]'$    and excision baker's cyst   PULLEY RELEASE RIGHT TRIGGER FINGER Right 01/08/2011   TRIGGER FINGER RELEASE Bilateral left 05/06/14;  right  02-29-2014    MEDICATIONS:  acetaminophen (TYLENOL) 650 MG CR tablet   diltiazem (CARDIZEM CD) 240 MG 24 hr capsule   ELIQUIS 5 MG TABS tablet   flecainide (TAMBOCOR) 50 MG tablet   fluticasone (FLONASE) 50 MCG/ACT nasal spray   omeprazole (PRILOSEC) 20 MG capsule   rosuvastatin (CRESTOR) 20 MG tablet   traZODone (DESYREL) 50 MG tablet   valACYclovir (VALTREX) 1000 MG tablet   No current facility-administered medications for this encounter.    Myra Gianotti, PA-C Surgical Short Stay/Anesthesiology [facility] Phone [phone] [facility] Phone [phone] 02/12/2022 5:11 PM

## 2022-02-12 NOTE — ED Provider Notes (Addendum)
[facility]    CSN: [id number] Arrival date & time: 02/12/22  1018      History   Chief Complaint Chief Complaint  Patient presents with   Rash    HPI GIRL SCHISSLER is a 71 y.o. female.   Patient presents with painful rash to lower left back that has been present for a few days.  Patient reports that pain started first and she attributed this to her chronic lower back pain.  Therefore, she went to see her spine specialist and was prescribed tramadol.  She started taking the tramadol and then the rash developed so she attributed the rash to allergic reaction to tramadol.  Therefore, she notified spine specialist that prescribed tramadol and they prescribed her prednisone.  She is also taking Benadryl with no improvement.  Denies any changes in environment including lotions, soaps, detergents, foods, etc.  Denies any associated fever.  She has spinal surgery scheduled in 1 week.   Rash   Past Medical History:  Diagnosis Date   Anticoagulant long-term use    eliquis --- managed by cardiology   Arthritis    BACK AND SHOULDERS   Depressive disorder, not elsewhere classified    Essential hypertension    followed by pcp and cardiology   Frequency of urination    GERD (gastroesophageal reflux disease)    History of dysphagia 2015   s/p egd w/ dilation, pt did not have stricture   History of gastritis    OSA (obstructive sleep apnea)    followed by dr t. turner---  study in epic 09-03-2020 moderate osa ,  pt scheduled for cpap titration 12-08-2020   PAF (paroxysmal atrial fibrillation) [facility])    cardiologist-- [name]---- new onset 06-30-2020 at pcp office visit,  s/p DCCV 07-28-2020 successful   Palpitations 06/2020   11-16-2020  per pt denies palpitations since DCCV 07-28-2020   Plantar fasciitis 08/2017   left foot    Pure hypercholesterolemia     Patient Active Problem List   Diagnosis Date Noted   Secondary hypercoagulable state ([place])  10/11/2021   Upper airway cough syndrome vs cough variant asthma 04/20/2021   OSA (obstructive sleep apnea) 09/13/2020   Persistent atrial fibrillation ([place]) 07/08/2020   Aortic atherosclerosis ([place]) 06/30/2020   Preop cardiovascular exam 04/02/2016   Other dysphagia 09/21/2013   Paroxysmal atrial fibrillation ([place]) 03/24/2013   Morbid obesity ([place]) 03/24/2013   DJD (degenerative joint disease) 03/24/2013   GERD (gastroesophageal reflux disease) 02/27/2012   Venous insufficiency 03/04/2010   Hyperlipidemia 01/21/2008   Essential hypertension 01/21/2008    Past Surgical History:  Procedure Laterality Date   BREAST LUMPECTOMY WITH RADIOACTIVE SEED LOCALIZATION Left 06/09/2020   Procedure: LEFT BREAST LUMPECTOMY WITH RADIOACTIVE SEED LOCALIZATION;  Surgeon: Coralie Keens, MD;  Location: [place];  Service: General;  Laterality: Left;  LMA   CARDIOVERSION N/A 07/28/2020   Procedure: CARDIOVERSION;  Surgeon: Werner Lean, MD;  Location: [place] [facility];  Service: Cardiovascular;  Laterality: N/A;   CARPAL TUNNEL RELEASE Bilateral right 2004;  left 2006   CATARACT EXTRACTION W/ INTRAOCULAR LENS  IMPLANT, BILATERAL  2016   CESAREAN SECTION  [id number]   BILATERAL TUBAL LIGATION W/ LAST C/S   COLONOSCOPY WITH PROPOFOL  last one 08/ 2013   [place] N/A 11/22/2020   Procedure: [place];  Surgeon: Megan Salon, MD;  Location: [place];  Service: Gynecology;  Laterality: N/A;  EAR CYST EXCISION  08/03/2011   patient states this is incorrect was a Baker's cyst popliteal area   ESOPHAGOGASTRODUODENOSCOPY (EGD) WITH ESOPHAGEAL DILATION  08/2013   KNEE ARTHROSCOPY W/ MENISCAL REPAIR Left 08-03-2011  '@[facility]'$    and excision baker's cyst   PULLEY RELEASE RIGHT TRIGGER FINGER Right 01/08/2011   TRIGGER FINGER RELEASE Bilateral left 05/06/14;  right  02-29-2014    OB History      Gravida  2   Para  2   Term      Preterm      AB      Living  2      SAB      IAB      Ectopic      Multiple      Live Births               Home Medications    Prior to Admission medications   Medication Sig Start Date End Date Taking? Authorizing Provider  valACYclovir (VALTREX) 1000 MG tablet Take 1 tablet (1,000 mg total) by mouth 3 (three) times daily for 7 days. 02/12/22 02/19/22 Yes Ulysses Alper, Michele Rockers, FNP  acetaminophen (TYLENOL) 650 MG CR tablet Take 650 mg by mouth in the morning and at bedtime.    [provider]  diltiazem (CARDIZEM CD) 240 MG 24 hr capsule TAKE 1 CAPSULE BY MOUTH EVERY DAY 11/30/21   Werner Lean, MD  ELIQUIS 5 MG TABS tablet TAKE 1 TABLET BY MOUTH TWICE A DAY 01/22/22   Hoyt Koch, MD  flecainide (TAMBOCOR) 50 MG tablet Take 0.5 tablets (25 mg total) by mouth 2 (two) times daily as needed (breakthrough afib). 12/12/21   [place]enton, Clint R, PA  fluticasone (FLONASE) 50 MCG/ACT nasal spray Place 2 sprays into both nostrils daily as needed for allergies or rhinitis.    [provider]  omeprazole (PRILOSEC) 20 MG capsule TAKE 1 CAPSULE BY MOUTH EVERY DAY 12/15/21   Hoyt Koch, MD  rosuvastatin (CRESTOR) 20 MG tablet TAKE 1 TABLET BY MOUTH EVERY DAY 02/05/22   Werner Lean, MD  traZODone (DESYREL) 50 MG tablet TAKE 1/2 TO 1 TABLET BY MOUTH AT BEDTIME AS NEEDED FOR SLEEP 02/07/22   Hoyt Koch, MD    Family History Family History  Problem Relation Age of Onset   Heart failure Mother    Osteoporosis Mother    Hyperlipidemia Mother    Hypertension Mother    Heart disease Father    Clotting disorder Father    Liver cancer Brother    Liver disease Maternal Aunt        x2   Colon cancer Neg Hx     Social History Social History   Tobacco Use   Smoking status: Never   Smokeless tobacco: Never   Tobacco comments:    Never smoke 10/11/21  Vaping Use   Vaping Use: Never  used  Substance Use Topics   Alcohol use: No   Drug use: Never     Allergies   Tramadol, Atenolol, Avelox [moxifloxacin hcl in nacl], Azithromycin, Doxycycline, Myrbetriq [mirabegron], Naproxen, and Oxybutynin   Review of Systems Review of Systems Per HPI  Physical Exam Triage Vital Signs ED Triage Vitals  Enc Vitals Group     BP 02/12/22 1137 (!) 170/80     Pulse Rate 02/12/22 1137 77     Resp 02/12/22 1137 19     Temp 02/12/22 1137 97.9 F (36.6 C)  Temp src --      SpO2 02/12/22 1137 95 %     Weight --      Height --      Head Circumference --      Peak Flow --      Pain Score 02/12/22 1136 8     Pain Loc --      Pain Edu? --      Excl. in [place]? --    No data found.  Updated Vital Signs BP (!) 154/70   Pulse 77   Temp 97.9 F (36.6 C)   Resp 19   LMP 07/23/2004   SpO2 95%   Visual Acuity Right Eye Distance:   Left Eye Distance:   Bilateral Distance:    Right Eye Near:   Left Eye Near:    Bilateral Near:     Physical Exam Constitutional:      General: She is not in acute distress.    Appearance: Normal appearance. She is not toxic-appearing or diaphoretic.  HENT:     Head: Normocephalic and atraumatic.  Eyes:     Extraocular Movements: Extraocular movements intact.     Conjunctiva/sclera: Conjunctivae normal.     Pupils: Pupils are equal, round, and reactive to light.  Cardiovascular:     Rate and Rhythm: Normal rate and regular rhythm.     Pulses: Normal pulses.     Heart sounds: Normal heart sounds.  Pulmonary:     Effort: Pulmonary effort is normal. No respiratory distress.     Breath sounds: Normal breath sounds.  Skin:    Comments: Vesicular and erythematous rash present to left lower back that appears to follow a dermatome.  All lesions appear intact and no drainage noted.  Neurological:     General: No focal deficit present.     Mental Status: She is alert and oriented to person, place, and time. Mental status is at baseline.      Cranial Nerves: Cranial nerves 2-12 are intact.     Sensory: Sensation is intact.     Motor: Motor function is intact.     Coordination: Coordination is intact.     Gait: Gait is intact.  Psychiatric:        Mood and Affect: Mood normal.        Behavior: Behavior normal.        Thought Content: Thought content normal.        Judgment: Judgment normal.      UC Treatments / Results  Labs (all labs ordered are listed, but only abnormal results are displayed) Labs Reviewed - No data to display  EKG   Radiology No results found.  Procedures Procedures (including critical care time)  Medications Ordered in UC Medications - No data to display  Initial Impression / Assessment and Plan / UC Course  I have reviewed the triage vital signs and the nursing notes.  Pertinent labs & imaging results that were available during my care of the patient were reviewed by me and considered in my medical decision making (see chart for details).     Rash is consistent with herpes zoster.  Will treat with Valtrex.  All lesions appear intact and no signs of bacterial infection.  Advised patient to notify spine specialty of shingles given pending surgery.  Offered patient gabapentin for pain but patient states that she is not able to tolerate gabapentin.  Discussed pain management with patient.  Discussed return precautions.  Patient verbalized understanding and was  agreeable with plan.  Patient has mildly elevated blood pressure with retake being better.  Suspect this is related to pain.  No signs of hypertensive urgency or endorgan damage on exam.  Patient to monitor at home.  Discussed return precautions.  Patient verbalized understanding and was agreeable with plan. Final Clinical Impressions(s) / UC Diagnoses   Final diagnoses:  Herpes zoster without complication     Discharge Instructions      You have shingles otherwise known as herpes zoster.  This is being treated with antiviral  medications.  Please follow-up if symptoms persist or worsen.    ED Prescriptions     Medication Sig Dispense Auth. Provider   valACYclovir (VALTREX) 1000 MG tablet Take 1 tablet (1,000 mg total) by mouth 3 (three) times daily for 7 days. 21 tablet [place], Michele Rockers, [place]      PDMP not reviewed this encounter.   Teodora Medici, [place] 02/12/22 [place], [place], [place] 02/12/22 1255

## 2022-02-12 NOTE — Anesthesia Preprocedure Evaluation (Addendum)
Anesthesia Evaluation  Patient identified by MRN, date of birth, ID band Patient awake    Reviewed: Allergy & Precautions, NPO status , Patient's Chart, lab work & pertinent test results  Airway Mallampati: III  TM Distance: >3 FB Neck ROM: Full    Dental no notable dental hx. (+) Teeth Intact, Dental Advisory Given   Pulmonary sleep apnea (oral device) ,    Pulmonary exam normal breath sounds clear to auscultation       Cardiovascular hypertension, Pt. on medications Normal cardiovascular exam+ dysrhythmias Atrial Fibrillation + Valvular Problems/Murmurs (mild MR) MR  Rhythm:Regular Rate:Normal  TTE 2023 1. Left ventricular ejection fraction, by estimation, is 60 to 65%. The  left ventricle has normal function. The left ventricle has no regional  wall motion abnormalities. Left ventricular diastolic parameters are  indeterminate.  2. Right ventricular systolic function is normal. The right ventricular  size is normal. There is normal pulmonary artery systolic pressure.  3. Left atrial size was mildly dilated.  4. The mitral valve is normal in structure. Mild mitral valve  regurgitation. No evidence of mitral stenosis.  5. The aortic valve is grossly normal. Aortic valve regurgitation is not  visualized. No aortic stenosis is present.  6. The inferior vena cava is normal in size with greater than 50%  respiratory variability, suggesting right atrial pressure of 3 mmHg.   Stress Test 2023 The study is normal. The study is low risk. .  No ST deviation was noted. .  LV perfusion is normal. .  Left ventricular function is normal. Nuclear stress EF: 69 %. The left ventricular ejection fraction is hyperdynamic (>65%). End diastolic cavity size is normal. .  Prior study not available for comparison.    Neuro/Psych PSYCHIATRIC DISORDERS Depression negative neurological ROS     GI/Hepatic Neg liver ROS, GERD  ,  Endo/Other   Morbid obesity (BMI 40)  Renal/GU negative Renal ROS  negative genitourinary   Musculoskeletal  (+) Arthritis ,   Abdominal   Peds  Hematology  (+) Blood dyscrasia (on eliquis), ,   Anesthesia Other Findings   Reproductive/Obstetrics                           Anesthesia Physical Anesthesia Plan  ASA: 3  Anesthesia Plan: General   Post-op Pain Management: Tylenol PO (pre-op)* and Ketamine IV*   Induction: Intravenous  PONV Risk Score and Plan: 3 and Dexamethasone, Ondansetron and Treatment may vary due to age or medical condition  Airway Management Planned: Oral ETT  Additional Equipment:   Intra-op Plan:   Post-operative Plan: Extubation in OR  Informed Consent: I have reviewed the patients History and Physical, chart, labs and discussed the procedure including the risks, benefits and alternatives for the proposed anesthesia with the patient or authorized representative who has indicated his/her understanding and acceptance.     Dental advisory given  Plan Discussed with: CRNA  Anesthesia Plan Comments:      Anesthesia Quick Evaluation

## 2022-02-12 NOTE — ED Triage Notes (Signed)
Pt presents to uc with co of rash on l back, se has surgery coming up on her back and was seen last week for increase in back pain they prescribed tramadol and she thought it was a reaction to the tramadol and was given prednisone. Pt now reports rash development burning and pain in area concerned for shingles.

## 2022-02-12 NOTE — Discharge Instructions (Addendum)
You have shingles otherwise known as herpes zoster.  This is being treated with antiviral medications.  Please follow-up if symptoms persist or worsen.

## 2022-02-15 ENCOUNTER — Telehealth: Payer: Self-pay

## 2022-02-15 DIAGNOSIS — I4891 Unspecified atrial fibrillation: Secondary | ICD-10-CM

## 2022-02-15 NOTE — Telephone Encounter (Signed)
Work up complete for afib ablation with Watchman to follow.  Pt called and instructions given.  Instruction letters mailed.

## 2022-02-19 ENCOUNTER — Encounter (HOSPITAL_COMMUNITY)
Admission: RE | Disposition: A | Discharge status: Home / Self Care | Payer: Self-pay | Source: Ambulatory Visit | Attending: Neurosurgery

## 2022-02-19 ENCOUNTER — Ambulatory Visit (HOSPITAL_COMMUNITY): Payer: Medicare Other | Admitting: Emergency Medicine

## 2022-02-19 ENCOUNTER — Ambulatory Visit (HOSPITAL_COMMUNITY): Payer: Medicare Other

## 2022-02-19 ENCOUNTER — Other Ambulatory Visit: Payer: Self-pay

## 2022-02-19 ENCOUNTER — Encounter (HOSPITAL_COMMUNITY): Payer: Self-pay | Admitting: Neurosurgery

## 2022-02-19 ENCOUNTER — Observation Stay (HOSPITAL_COMMUNITY)
Admission: RE | Admit: 2022-02-19 | Discharge: 2022-02-20 | Disposition: A | Discharge status: Home / Self Care | Payer: Medicare Other | Source: Ambulatory Visit | Attending: Neurosurgery | Admitting: Neurosurgery

## 2022-02-19 ENCOUNTER — Ambulatory Visit (HOSPITAL_BASED_OUTPATIENT_CLINIC_OR_DEPARTMENT_OTHER): Payer: Medicare Other | Admitting: Anesthesiology

## 2022-02-19 DIAGNOSIS — M5416 Radiculopathy, lumbar region: Secondary | ICD-10-CM

## 2022-02-19 DIAGNOSIS — Z7901 Long term (current) use of anticoagulants: Secondary | ICD-10-CM | POA: Diagnosis not present

## 2022-02-19 DIAGNOSIS — Z79899 Other long term (current) drug therapy: Secondary | ICD-10-CM | POA: Insufficient documentation

## 2022-02-19 DIAGNOSIS — I480 Paroxysmal atrial fibrillation: Secondary | ICD-10-CM | POA: Insufficient documentation

## 2022-02-19 DIAGNOSIS — M4316 Spondylolisthesis, lumbar region: Secondary | ICD-10-CM

## 2022-02-19 DIAGNOSIS — I4891 Unspecified atrial fibrillation: Secondary | ICD-10-CM | POA: Diagnosis not present

## 2022-02-19 DIAGNOSIS — M48061 Spinal stenosis, lumbar region without neurogenic claudication: Secondary | ICD-10-CM | POA: Diagnosis not present

## 2022-02-19 DIAGNOSIS — I10 Essential (primary) hypertension: Secondary | ICD-10-CM | POA: Diagnosis not present

## 2022-02-19 DIAGNOSIS — M4310 Spondylolisthesis, site unspecified: Secondary | ICD-10-CM | POA: Diagnosis present

## 2022-02-19 HISTORY — PX: LUMBAR FUSION: SHX111

## 2022-02-19 LAB — SURGICAL PCR SCREEN
MRSA, PCR: NEGATIVE
Staphylococcus aureus: NEGATIVE

## 2022-02-19 LAB — ABO/RH: ABO/RH(D): O POS

## 2022-02-19 SURGERY — POSTERIOR LUMBAR FUSION 1 LEVEL
Anesthesia: General | Site: Back

## 2022-02-19 MED ORDER — MENTHOL 3 MG MT LOZG
1.0000 | LOZENGE | OROMUCOSAL | Status: DC | PRN
Start: 2022-02-19 — End: 2022-02-20

## 2022-02-19 MED ORDER — ONDANSETRON HCL 4 MG/2ML IJ SOLN
INTRAMUSCULAR | Status: DC | PRN
  Administered 2022-02-19: 4 mg via INTRAVENOUS

## 2022-02-19 MED ORDER — OXYCODONE HCL 5 MG PO TABS
ORAL_TABLET | ORAL | Status: AC
  Filled 2022-02-19: qty 2

## 2022-02-19 MED ORDER — POLYETHYLENE GLYCOL 3350 17 G PO PACK: 17.0000 g | PACK | Freq: Every day | ORAL | Status: DC | PRN

## 2022-02-19 MED ORDER — PROPOFOL 10 MG/ML IV BOLUS
INTRAVENOUS | Status: DC | PRN
  Administered 2022-02-19: 120 mg via INTRAVENOUS

## 2022-02-19 MED ORDER — DILTIAZEM HCL ER COATED BEADS 120 MG PO CP24: 240.0000 mg | ORAL_CAPSULE | Freq: Every day | ORAL | Status: DC

## 2022-02-19 MED ORDER — HYDROXYZINE HCL 50 MG/ML IM SOLN
50.0000 mg | Freq: Four times a day (QID) | INTRAMUSCULAR | Status: DC | PRN
  Administered 2022-02-19: 50 mg via INTRAMUSCULAR
  Filled 2022-02-19: qty 1

## 2022-02-19 MED ORDER — BUPIVACAINE HCL (PF) 0.25 % IJ SOLN
INTRAMUSCULAR | Status: DC | PRN
  Administered 2022-02-19: 20 mL

## 2022-02-19 MED ORDER — TRAZODONE HCL 50 MG PO TABS: 25.0000 mg | ORAL_TABLET | Freq: Every evening | ORAL | Status: DC | PRN

## 2022-02-19 MED ORDER — FENTANYL CITRATE (PF) 100 MCG/2ML IJ SOLN
25.0000 ug | INTRAMUSCULAR | Status: DC | PRN
  Administered 2022-02-19 (×3): 50 ug via INTRAVENOUS

## 2022-02-19 MED ORDER — ONDANSETRON HCL 4 MG/2ML IJ SOLN
4.0000 mg | Freq: Four times a day (QID) | INTRAMUSCULAR | Status: DC | PRN
  Administered 2022-02-19: 4 mg via INTRAVENOUS

## 2022-02-19 MED ORDER — FENTANYL CITRATE (PF) 250 MCG/5ML IJ SOLN
INTRAMUSCULAR | Status: AC
  Filled 2022-02-19: qty 5

## 2022-02-19 MED ORDER — CHLORHEXIDINE GLUCONATE CLOTH 2 % EX PADS: 6.0000 | MEDICATED_PAD | Freq: Once | CUTANEOUS | Status: DC

## 2022-02-19 MED ORDER — GLYCOPYRROLATE PF 0.2 MG/ML IJ SOSY
PREFILLED_SYRINGE | INTRAMUSCULAR | Status: DC | PRN
  Administered 2022-02-19: .2 mg via INTRAVENOUS

## 2022-02-19 MED ORDER — ONDANSETRON HCL 4 MG PO TABS
4.0000 mg | ORAL_TABLET | Freq: Four times a day (QID) | ORAL | Status: DC | PRN
Start: 2022-02-19 — End: 2022-02-20
  Administered 2022-02-19: 4 mg via ORAL
  Filled 2022-02-19: qty 1

## 2022-02-19 MED ORDER — FLEET ENEMA 7-19 GM/118ML RE ENEM: 1.0000 | ENEMA | Freq: Once | RECTAL | Status: DC | PRN

## 2022-02-19 MED ORDER — FENTANYL CITRATE (PF) 250 MCG/5ML IJ SOLN
INTRAMUSCULAR | Status: DC | PRN
Start: 2022-02-19 — End: 2022-02-19
  Administered 2022-02-19: 50 ug via INTRAVENOUS
  Administered 2022-02-19: 100 ug via INTRAVENOUS

## 2022-02-19 MED ORDER — FENTANYL CITRATE (PF) 100 MCG/2ML IJ SOLN
INTRAMUSCULAR | Status: AC
  Filled 2022-02-19: qty 2

## 2022-02-19 MED ORDER — HYDROMORPHONE HCL 1 MG/ML IJ SOLN
INTRAMUSCULAR | Status: AC
  Filled 2022-02-19: qty 0.5

## 2022-02-19 MED ORDER — VANCOMYCIN HCL 1000 MG IV SOLR
INTRAVENOUS | Status: DC | PRN
  Administered 2022-02-19: 1000 mg

## 2022-02-19 MED ORDER — THROMBIN 20000 UNITS EX SOLR
CUTANEOUS | Status: DC | PRN
  Administered 2022-02-19: 20 mL via TOPICAL

## 2022-02-19 MED ORDER — FLUTICASONE PROPIONATE 50 MCG/ACT NA SUSP
2.0000 | Freq: Every day | NASAL | Status: DC | PRN
Start: 2022-02-19 — End: 2022-02-20

## 2022-02-19 MED ORDER — PHENOL 1.4 % MT LIQD: 1.0000 | OROMUCOSAL | Status: DC | PRN

## 2022-02-19 MED ORDER — ACETAMINOPHEN 500 MG PO TABS
1000.0000 mg | ORAL_TABLET | Freq: Once | ORAL | Status: AC
  Administered 2022-02-19: 1000 mg via ORAL
  Filled 2022-02-19: qty 2

## 2022-02-19 MED ORDER — LACTATED RINGERS IV SOLN: INTRAVENOUS | Status: DC

## 2022-02-19 MED ORDER — BISACODYL 10 MG RE SUPP: 10.0000 mg | Freq: Every day | RECTAL | Status: DC | PRN

## 2022-02-19 MED ORDER — CHLORHEXIDINE GLUCONATE 0.12 % MT SOLN
15.0000 mL | Freq: Once | OROMUCOSAL | Status: AC
  Administered 2022-02-19: 15 mL via OROMUCOSAL
  Filled 2022-02-19: qty 15

## 2022-02-19 MED ORDER — THROMBIN 20000 UNITS EX SOLR
CUTANEOUS | Status: AC
  Filled 2022-02-19: qty 20000

## 2022-02-19 MED ORDER — LIDOCAINE 2% (20 MG/ML) 5 ML SYRINGE
INTRAMUSCULAR | Status: DC | PRN
  Administered 2022-02-19: 60 mg via INTRAVENOUS

## 2022-02-19 MED ORDER — FLECAINIDE ACETATE 50 MG PO TABS: 25.0000 mg | ORAL_TABLET | Freq: Two times a day (BID) | ORAL | Status: DC | PRN

## 2022-02-19 MED ORDER — HYDROCODONE-ACETAMINOPHEN 10-325 MG PO TABS: 1.0000 | ORAL_TABLET | ORAL | Status: DC | PRN

## 2022-02-19 MED ORDER — CEFAZOLIN SODIUM-DEXTROSE 2-4 GM/100ML-% IV SOLN
2.0000 g | INTRAVENOUS | Status: AC
  Administered 2022-02-19: 2 g via INTRAVENOUS
  Filled 2022-02-19: qty 100

## 2022-02-19 MED ORDER — PHENYLEPHRINE HCL-NACL 20-0.9 MG/250ML-% IV SOLN
INTRAVENOUS | Status: DC | PRN
  Administered 2022-02-19: 35 ug/min via INTRAVENOUS

## 2022-02-19 MED ORDER — CEFAZOLIN SODIUM-DEXTROSE 1-4 GM/50ML-% IV SOLN
1.0000 g | Freq: Three times a day (TID) | INTRAVENOUS | Status: AC
  Administered 2022-02-19 – 2022-02-20 (×2): 1 g via INTRAVENOUS
  Filled 2022-02-19 (×2): qty 50

## 2022-02-19 MED ORDER — SODIUM CHLORIDE 0.9% FLUSH: 3.0000 mL | INTRAVENOUS | Status: DC | PRN

## 2022-02-19 MED ORDER — DIAZEPAM 5 MG PO TABS: 5.0000 mg | ORAL_TABLET | Freq: Four times a day (QID) | ORAL | Status: DC | PRN

## 2022-02-19 MED ORDER — OXYCODONE HCL 5 MG PO TABS
10.0000 mg | ORAL_TABLET | ORAL | Status: DC | PRN
  Administered 2022-02-19 – 2022-02-20 (×4): 10 mg via ORAL
  Filled 2022-02-19 (×3): qty 2

## 2022-02-19 MED ORDER — VANCOMYCIN HCL 1000 MG IV SOLR
INTRAVENOUS | Status: AC
  Filled 2022-02-19: qty 20

## 2022-02-19 MED ORDER — SUGAMMADEX SODIUM 500 MG/5ML IV SOLN
INTRAVENOUS | Status: DC | PRN
  Administered 2022-02-19: 500 mg via INTRAVENOUS

## 2022-02-19 MED ORDER — ACETAMINOPHEN 325 MG PO TABS
650.0000 mg | ORAL_TABLET | ORAL | Status: DC | PRN
  Administered 2022-02-19 – 2022-02-20 (×3): 650 mg via ORAL
  Filled 2022-02-19 (×3): qty 2

## 2022-02-19 MED ORDER — ACETAMINOPHEN 650 MG RE SUPP: 650.0000 mg | RECTAL | Status: DC | PRN

## 2022-02-19 MED ORDER — PHENYLEPHRINE 80 MCG/ML (10ML) SYRINGE FOR IV PUSH (FOR BLOOD PRESSURE SUPPORT)
PREFILLED_SYRINGE | INTRAVENOUS | Status: DC | PRN
  Administered 2022-02-19: 80 ug via INTRAVENOUS

## 2022-02-19 MED ORDER — HYDROMORPHONE HCL 1 MG/ML IJ SOLN: 1.0000 mg | INTRAMUSCULAR | Status: DC | PRN

## 2022-02-19 MED ORDER — 0.9 % SODIUM CHLORIDE (POUR BTL) OPTIME
TOPICAL | Status: DC | PRN
  Administered 2022-02-19: 1000 mL

## 2022-02-19 MED ORDER — ROSUVASTATIN CALCIUM 20 MG PO TABS
20.0000 mg | ORAL_TABLET | Freq: Every day | ORAL | Status: DC
  Administered 2022-02-19: 20 mg via ORAL
  Filled 2022-02-19: qty 1

## 2022-02-19 MED ORDER — SODIUM CHLORIDE 0.9% FLUSH
3.0000 mL | Freq: Two times a day (BID) | INTRAVENOUS | Status: DC
Start: 2022-02-19 — End: 2022-02-20
  Administered 2022-02-19 (×2): 3 mL via INTRAVENOUS

## 2022-02-19 MED ORDER — ORAL CARE MOUTH RINSE: 15.0000 mL | Freq: Once | OROMUCOSAL | Status: AC

## 2022-02-19 MED ORDER — ROCURONIUM BROMIDE 10 MG/ML (PF) SYRINGE
PREFILLED_SYRINGE | INTRAVENOUS | Status: DC | PRN
  Administered 2022-02-19: 100 mg via INTRAVENOUS

## 2022-02-19 MED ORDER — SODIUM CHLORIDE 0.9 % IV SOLN
250.0000 mL | INTRAVENOUS | Status: DC
  Administered 2022-02-19: 250 mL via INTRAVENOUS

## 2022-02-19 MED ORDER — HYDROMORPHONE HCL 1 MG/ML IJ SOLN
INTRAMUSCULAR | Status: DC | PRN
  Administered 2022-02-19: .5 mg via INTRAVENOUS

## 2022-02-19 MED ORDER — ONDANSETRON HCL 4 MG/2ML IJ SOLN
INTRAMUSCULAR | Status: AC
  Filled 2022-02-19: qty 2

## 2022-02-19 MED ORDER — BUPIVACAINE HCL (PF) 0.25 % IJ SOLN
INTRAMUSCULAR | Status: AC
  Filled 2022-02-19: qty 30

## 2022-02-19 MED ORDER — PANTOPRAZOLE SODIUM 40 MG PO TBEC: 40.0000 mg | DELAYED_RELEASE_TABLET | Freq: Every day | ORAL | Status: DC

## 2022-02-19 SURGICAL SUPPLY — 64 items
BAG COUNTER SPONGE SURGICOUNT (BAG) ×2 IMPLANT
BAG DECANTER FOR FLEXI CONT (MISCELLANEOUS) ×2 IMPLANT
BENZOIN TINCTURE PRP APPL 2/3 (GAUZE/BANDAGES/DRESSINGS) ×2 IMPLANT
BLADE BONE MILL MEDIUM (MISCELLANEOUS) ×2 IMPLANT
BLADE CLIPPER SURG (BLADE) IMPLANT
BUR CUTTER 7.0 ROUND (BURR) IMPLANT
BUR MATCHSTICK NEURO 3.0 LAGG (BURR) ×2 IMPLANT
CAGE EXP CATALYFT 9 (Plate) ×2 IMPLANT
CANISTER SUCT 3000ML PPV (MISCELLANEOUS) ×2 IMPLANT
CAP LCK SPNE (Orthopedic Implant) ×4 IMPLANT
CAP LOCK SPINE RADIUS (Orthopedic Implant) IMPLANT
CAP LOCKING (Orthopedic Implant) ×8 IMPLANT
CARTRIDGE OIL MAESTRO DRILL (MISCELLANEOUS) ×1 IMPLANT
CNTNR URN SCR LID CUP LEK RST (MISCELLANEOUS) ×1 IMPLANT
CONT SPEC 4OZ STRL OR WHT (MISCELLANEOUS) ×2
COVER BACK TABLE 60X90IN (DRAPES) ×2 IMPLANT
DERMABOND ADVANCED (GAUZE/BANDAGES/DRESSINGS) ×1
DERMABOND ADVANCED .7 DNX12 (GAUZE/BANDAGES/DRESSINGS) ×1 IMPLANT
DIFFUSER DRILL AIR PNEUMATIC (MISCELLANEOUS) ×2 IMPLANT
DRAPE C-ARM 42X72 X-RAY (DRAPES) ×4 IMPLANT
DRAPE HALF SHEET 40X57 (DRAPES) IMPLANT
DRAPE LAPAROTOMY 100X72X124 (DRAPES) ×2 IMPLANT
DRAPE SURG 17X23 STRL (DRAPES) ×8 IMPLANT
DRSG OPSITE POSTOP 4X6 (GAUZE/BANDAGES/DRESSINGS) ×2 IMPLANT
DURAPREP 26ML APPLICATOR (WOUND CARE) ×2 IMPLANT
ELECT REM PT RETURN 9FT ADLT (ELECTROSURGICAL) ×2
ELECTRODE REM PT RTRN 9FT ADLT (ELECTROSURGICAL) ×1 IMPLANT
EVACUATOR 1/8 PVC DRAIN (DRAIN) IMPLANT
GAUZE 4X4 16PLY ~~LOC~~+RFID DBL (SPONGE) IMPLANT
GAUZE SPONGE 4X4 12PLY STRL (GAUZE/BANDAGES/DRESSINGS) IMPLANT
GLOVE BIO SURGEON STRL SZ 6.5 (GLOVE) ×2 IMPLANT
GLOVE BIOGEL PI IND STRL 6.5 (GLOVE) ×1 IMPLANT
GLOVE BIOGEL PI IND STRL 7.5 (GLOVE) IMPLANT
GLOVE BIOGEL PI INDICATOR 6.5 (GLOVE) ×1
GLOVE BIOGEL PI INDICATOR 7.5 (GLOVE) ×1
GLOVE ECLIPSE 7.5 STRL STRAW (GLOVE) ×1 IMPLANT
GLOVE ECLIPSE 9.0 STRL (GLOVE) ×4 IMPLANT
GLOVE EXAM NITRILE XL STR (GLOVE) IMPLANT
GOWN STRL REUS W/ TWL LRG LVL3 (GOWN DISPOSABLE) IMPLANT
GOWN STRL REUS W/ TWL XL LVL3 (GOWN DISPOSABLE) ×2 IMPLANT
GOWN STRL REUS W/TWL 2XL LVL3 (GOWN DISPOSABLE) IMPLANT
GOWN STRL REUS W/TWL LRG LVL3 (GOWN DISPOSABLE)
GOWN STRL REUS W/TWL XL LVL3 (GOWN DISPOSABLE) ×4
KIT BASIN OR (CUSTOM PROCEDURE TRAY) ×2 IMPLANT
KIT TURNOVER KIT B (KITS) ×2 IMPLANT
MILL MEDIUM DISP (BLADE) ×2 IMPLANT
NEEDLE HYPO 22GX1.5 SAFETY (NEEDLE) ×2 IMPLANT
NS IRRIG 1000ML POUR BTL (IV SOLUTION) ×2 IMPLANT
OIL CARTRIDGE MAESTRO DRILL (MISCELLANEOUS) ×2
PACK LAMINECTOMY NEURO (CUSTOM PROCEDURE TRAY) ×2 IMPLANT
PUTTY GRAFTON DBF 6CC W/DELIVE (Putty) ×1 IMPLANT
ROD RADIUS 35MM (Rod) ×2 IMPLANT
SCREW 5.75X40M (Screw) ×4 IMPLANT
SPIKE FLUID TRANSFER (MISCELLANEOUS) ×2 IMPLANT
SPONGE SURGIFOAM ABS GEL 100 (HEMOSTASIS) ×2 IMPLANT
STRIP CLOSURE SKIN 1/2X4 (GAUZE/BANDAGES/DRESSINGS) ×3 IMPLANT
SUT VIC AB 0 CT1 18XCR BRD8 (SUTURE) ×2 IMPLANT
SUT VIC AB 0 CT1 8-18 (SUTURE) ×4
SUT VIC AB 2-0 CT1 18 (SUTURE) ×2 IMPLANT
SUT VIC AB 3-0 SH 8-18 (SUTURE) ×5 IMPLANT
TOWEL GREEN STERILE (TOWEL DISPOSABLE) ×2 IMPLANT
TOWEL GREEN STERILE FF (TOWEL DISPOSABLE) ×2 IMPLANT
TRAY FOLEY MTR SLVR 16FR STAT (SET/KITS/TRAYS/PACK) ×2 IMPLANT
WATER STERILE IRR 1000ML POUR (IV SOLUTION) ×2 IMPLANT

## 2022-02-19 NOTE — H&P (Signed)
Amy Williamson is an 71 y.o. female.   Chief Complaint: Back pain HPI: 71 year old female with chronic and progressively worsening lower back pain with bilateral lower extremity radicular symptoms.  Patient with difficulty standing and walking secondary to pain.  Patient has failed conservative management including injections and therapy.  Work-up demonstrates evidence of an unstable grade 2 L4-5 degenerative spondylolisthesis with severe lateral recess and foraminal stenosis bilaterally.  She has some moderate disc degeneration at L5-S1 but no evidence of significant stenosis or instability.  She has some early degenerative change at the upper levels of her lumbar spine again without any compressive pathology.  Patient presents now for L4-5 decompression and fusion surgery.  Patient was relatively recently diagnosed with a shingles outbreak in her right lower lumbar region.  These symptoms have dissipated greatly.  She has a little bit of discomfort.  She has no open draining wounds.  She wishes to move forward with surgery.  Past Medical History:  Diagnosis Date   Anticoagulant long-term use    eliquis --- managed by cardiology   Arthritis    BACK AND SHOULDERS   Depressive disorder, not elsewhere classified    Essential hypertension    followed by pcp and cardiology   Frequency of urination    GERD (gastroesophageal reflux disease)    History of dysphagia 2015   s/p egd w/ dilation, pt did not have stricture   History of gastritis    OSA (obstructive sleep apnea)    followed by dr t. turner---  study in epic 09-03-2020 moderate osa ,  pt scheduled for cpap titration 12-08-2020   PAF (paroxysmal atrial fibrillation) [facility])    cardiologist-- [name]---- new onset 06-30-2020 at pcp office visit,  s/p DCCV 07-28-2020 successful   Palpitations 06/2020   11-16-2020  per pt denies palpitations since DCCV 07-28-2020   Plantar fasciitis 08/2017   left foot    Pure  hypercholesterolemia     Past Surgical History:  Procedure Laterality Date   BREAST LUMPECTOMY WITH RADIOACTIVE SEED LOCALIZATION Left 06/09/2020   Procedure: LEFT BREAST LUMPECTOMY WITH RADIOACTIVE SEED LOCALIZATION;  Surgeon: Coralie Keens, MD;  Location: [place];  Service: General;  Laterality: Left;  LMA   CARDIOVERSION N/A 07/28/2020   Procedure: CARDIOVERSION;  Surgeon: Werner Lean, MD;  Location: [place] [facility];  Service: Cardiovascular;  Laterality: N/A;   CARPAL TUNNEL RELEASE Bilateral right 2004;  left 2006   CATARACT EXTRACTION W/ INTRAOCULAR LENS  IMPLANT, BILATERAL  2016   CESAREAN SECTION  [id number]   BILATERAL TUBAL LIGATION W/ LAST C/S   COLONOSCOPY WITH PROPOFOL  last one 08/ 2013   [place] N/A 11/22/2020   Procedure: [place];  Surgeon: Megan Salon, MD;  Location: [place];  Service: Gynecology;  Laterality: N/A;   EAR CYST EXCISION  08/03/2011   patient states this is incorrect was a Baker's cyst popliteal area   ESOPHAGOGASTRODUODENOSCOPY (EGD) WITH ESOPHAGEAL DILATION  08/2013   KNEE ARTHROSCOPY W/ MENISCAL REPAIR Left 08-03-2011  '@[facility]'$    and excision baker's cyst   PULLEY RELEASE RIGHT TRIGGER FINGER Right 01/08/2011   TRIGGER FINGER RELEASE Bilateral left 05/06/14;  right  02-29-2014    Family History  Problem Relation Age of Onset   Heart failure Mother    Osteoporosis Mother    Hyperlipidemia Mother    Hypertension Mother    Heart disease Father    Clotting disorder Father  Liver cancer Brother    Liver disease Maternal Aunt        x2   Colon cancer Neg Hx    Social History:  reports that she has never smoked. She has never used smokeless tobacco. She reports that she does not drink alcohol and does not use drugs.  Allergies:  Allergies  Allergen Reactions   Tramadol Itching and Rash    May have been shingles  instead of allergy per patient   Atenolol     Decreased blood pressure   Avelox [Moxifloxacin Hcl In Nacl]     Pt did not like side effects   Azithromycin     ?causes muscle weakness, dysphagia   Doxycycline Nausea Only    And heartburn   Myrbetriq [Mirabegron]     Increased blood pressure   Naproxen     Can not take while on Eliquis   Oxybutynin Palpitations    Per pt made her have rapid heart beat    Medications Prior to Admission  Medication Sig Dispense Refill   acetaminophen (TYLENOL) 650 MG CR tablet Take 650 mg by mouth in the morning and at bedtime.     diltiazem (CARDIZEM CD) 240 MG 24 hr capsule TAKE 1 CAPSULE BY MOUTH EVERY DAY 90 capsule 3   ELIQUIS 5 MG TABS tablet TAKE 1 TABLET BY MOUTH TWICE A DAY 180 tablet 3   flecainide (TAMBOCOR) 50 MG tablet Take 0.5 tablets (25 mg total) by mouth 2 (two) times daily as needed (breakthrough afib). 60 tablet 3   omeprazole (PRILOSEC) 20 MG capsule TAKE 1 CAPSULE BY MOUTH EVERY DAY 90 capsule 1   rosuvastatin (CRESTOR) 20 MG tablet TAKE 1 TABLET BY MOUTH EVERY DAY 90 tablet 3   traZODone (DESYREL) 50 MG tablet TAKE 1/2 TO 1 TABLET BY MOUTH AT BEDTIME AS NEEDED FOR SLEEP 90 tablet 1   valACYclovir (VALTREX) 1000 MG tablet Take 1 tablet (1,000 mg total) by mouth 3 (three) times daily for 7 days. 21 tablet 0   fluticasone (FLONASE) 50 MCG/ACT nasal spray Place 2 sprays into both nostrils daily as needed for allergies or rhinitis.      Results for orders placed or performed during the hospital encounter of 02/19/22 (from the past 48 hour(s))  ABO/Rh     Status: None (Preliminary result)   Collection Time: 02/19/22  8:45 AM  Result Value Ref Range   ABO/RH(D) PENDING    No results found.  Pertinent items noted in HPI and remainder of comprehensive ROS otherwise negative.  Blood pressure (!) 144/65, pulse 78, temperature 97.7 F (36.5 C), temperature source Oral, resp. rate 18, height 5' (1.524 m), weight 92.1 kg, last menstrual  period 07/23/2004, SpO2 98 %.  Patient is awake and alert.  She is oriented and appropriate.  Speech is fluent.  Judgment insight are intact.  Cranial nerve function normal bilateral.  Motor examination reveals intact motor strength bilaterally sensory examination decrease sensation pinprick and light touch distally in both lower extremities.  Reflexes are hypoactive but symmetric.  Gait is antalgic.  Posture is moderately flexed peer examination head ears eyes nose and throat are unremarkable.  Chest abdomen are benign.  Extremities are free from injury or deformity.  Her lumbar region off to the right side has some scattered raised lesions without any open draining wounds or evidence of any significant vesicular formation. Assessment/Plan Grade 2 L4-5 degenerative spondylolisthesis with severe back and lower extremity pain.  Plan bilateral L4-5 decompressive laminotomy  and foraminotomies followed by posterior lumbar MRI fusion using interbody cages, local autograft, and augmented with posterior arthrodesis utilizing nonsegmental pedicle screw fixation and local autograft.  Risks and benefits been explained.  Patient wishes to proceed.  Mallie Mussel A Kamil Hanigan 02/19/2022, 9:20 AM

## 2022-02-19 NOTE — Progress Notes (Signed)
Orthopedic Tech Progress Note Patient Details:  Amy Williamson 02/13/1951 [id number]  Patient has back brace  Patient ID: [name], female   DOB: Dec 04, 1950, 71 y.o.   MRN: [id number]  [name] 02/19/2022, 2:27 PM

## 2022-02-19 NOTE — Op Note (Signed)
Date of procedure: 02/19/2022  Date of dictation: Same  Service: Neurosurgery  Preoperative diagnosis: Grade 2 L4-5 degenerative spondylolisthesis with stenosis and radiculopathy.  Postoperative diagnosis: Same  Procedure Name: Bilateral L4-5 decompressive laminotomies and foraminotomies, more than would be required for simple interbody fusion alone.  Bilateral L4-5 ponte osteotomies for sagittal plane restoration  L4-5 posterior lumbar interbody fusion utilizing interbody cages, local harvested autograft and morselized allograft  L4-5 posterior lateral arthrodesis utilizing nonsegmental pedicle screw fixation and local autograft.  Surgeon:Zennie Ayars A.Lavonia Eager, M.D.  Asst. Surgeon: Montez Hageman, NP  Anesthesia: General  Indication: 71 year old female with intractable back and bilateral lower extremity symptoms worsened with standing and walking.  Work-up demonstrates evidence of an unstable grade 2 L4-5 degenerative spondylolisthesis with significant spondylosis and stenosis.  Patient presents now for decompression and fusion in hopes of improving her symptoms.  Operative note: After induction of anesthesia, patient [place] prone on the Wilson frame and appropriate padded.  Lumbar region prepped and draped sterilely.  Incision made overlying L4-5.  Dissection performed bilaterally.  Retractor placed.  Fluoroscopy used.  Level was confirmed.  Decompressive laminotomies and foraminotomies then performed using [place] rongeurs, care centers a high-speed drill to remove the inferior two thirds of the lamina of L4 the entire pars interarticularis and inferior facet of L4 was removed bilaterally.  The superior aspect of the lamina of L5 was removed bilaterally and the superior articular process of L5 was removed bilaterally.  Further lateral resection was performed completing ponte osteotomies which allowed for more complete mobilization of the segment for better reduction.  Disc space was then  incised.  Disc space was then prepared for interbody fusion.  Soft tissue removed the interspace.  A 9 mm [employer] expandable cage was then impacted on the left side and expanded.  Distractor removed patient's right side.  Disc base prepared on the right side.  Morselized autograft was then packed in the interspace.  A second cage was then impacted into place and expanded.  Each cage was then packed with demineralized bone fibers.  Pedicles of L4 and L5 were identified using surface landmarks and intraoperative fluoroscopy and superficial bone overlying the pedicle was then removed using high-speed drill.  Pedicle was then probed using a pedicle all each pedicle all track was then probed and found to be solidly within the bone.  Each pedicle tract was then tapped with a screw tap.  Screw double was probed and found to be solidly within the bone.  5.75 mm radius brand screws from [employer] placed bilaterally at L4 and L5.  Final images reveal good position of the cages and the hardware at the proper upper level with normal alignment of spine.  Wound was then irrigated.  Gelfoam was placed topically for hemostasis.  Transverse processes were decorticated with morselized autograft was packed posterior laterally.  Short segment titanium rod was then placed over the screws at L4 and L5.  Locking caps placed over the screws and locking caps then engaged with a construct under compression.  Vancomycin powder was placed in deep wound space.  Wound was then closed in layers with Vicryl sutures.  Steri-Strips and sterile dressing were applied.  No apparent complications.  Patient tolerated the procedure well and she returns to the recovery room postop.

## 2022-02-19 NOTE — Progress Notes (Signed)
Pt hx of recent shingles, completed course of Valtrex yesterday. No open areas noted to rash on lower back. Dr. Annette Stable assessed patient at bedside and stated that patient would not need to be on airborne precautions.

## 2022-02-19 NOTE — Brief Op Note (Signed)
02/19/2022  12:20 PM  PATIENT:  Amy Williamson  71 y.o. female  PRE-OPERATIVE DIAGNOSIS:  Spondylolisthesis  POST-OPERATIVE DIAGNOSIS:  * No post-op diagnosis entered *  PROCEDURE:  Procedure(s): PLIF - L4-L5 (N/A)  SURGEON:  Surgeon(s) and Role:    * Earnie Larsson, MD - Primary    * Ostergard, Joyice Faster, MD - Assisting  PHYSICIAN ASSISTANT:   ASSISTANTSMearl Latin   ANESTHESIA:   general  EBL:  150cc   BLOOD ADMINISTERED:none  DRAINS: none   LOCAL MEDICATIONS USED:  MARCAINE     SPECIMEN:  No Specimen  DISPOSITION OF SPECIMEN:  N/A  COUNTS:  YES  TOURNIQUET:  * No tourniquets in log *  DICTATION: .Dragon Dictation  PLAN OF CARE: Admit for overnight observation  PATIENT DISPOSITION:  PACU - hemodynamically stable.   Delay start of Pharmacological VTE agent (>24hrs) due to surgical blood loss or risk of bleeding: yes

## 2022-02-19 NOTE — Transfer of Care (Signed)
Immediate Anesthesia Transfer of Care Note  Patient: Amy Williamson  Procedure(s) Performed: Posterior Lumbar Interbody Fusion Lumbar Four-Five (Back)  Patient Location: PACU  Anesthesia Type:General  Level of Consciousness: drowsy and patient cooperative  Airway & Oxygen Therapy: Patient Spontanous Breathing  Post-op Assessment: Report given to RN and Post -op Vital signs reviewed and stable  Post vital signs: Reviewed and stable  Last Vitals:  Vitals Value Taken Time  BP 87/45 02/19/22 1235  Temp    Pulse 54 02/19/22 1240  Resp 16 02/19/22 1240  SpO2 95 % 02/19/22 1240  Vitals shown include unvalidated device data.  Last Pain:  Vitals:   02/19/22 0831  TempSrc:   PainSc: 4          Complications: No notable events documented.

## 2022-02-19 NOTE — Anesthesia Procedure Notes (Signed)
Procedure Name: Intubation Date/Time: 02/19/2022 10:36 AM  Performed by: Georgia Duff, CRNAPre-anesthesia Checklist: Patient identified, Emergency Drugs available, Suction available and Patient being monitored Patient Re-evaluated:Patient Re-evaluated prior to induction Oxygen Delivery Method: Circle System Utilized Preoxygenation: Pre-oxygenation with 100% oxygen Induction Type: IV induction Ventilation: Mask ventilation without difficulty Laryngoscope Size: 2 Grade View: Grade I Tube type: Oral Tube size: 7.0 mm Number of attempts: 1 Airway Equipment and Method: Stylet and Oral airway Placement Confirmation: ETT inserted through vocal cords under direct vision, positive ETCO2 and breath sounds checked- equal and bilateral Secured at: 21 cm Tube secured with: Tape Dental Injury: Teeth and Oropharynx as per pre-operative assessment

## 2022-02-19 NOTE — Anesthesia Postprocedure Evaluation (Signed)
Anesthesia Post Note  Patient: Amy Williamson  Procedure(s) Performed: Posterior Lumbar Interbody Fusion Lumbar Four-Five (Back)     Patient location during evaluation: PACU Anesthesia Type: General Level of consciousness: awake and alert Pain management: pain level controlled Vital Signs Assessment: post-procedure vital signs reviewed and stable Respiratory status: spontaneous breathing, nonlabored ventilation, respiratory function stable and patient connected to nasal cannula oxygen Cardiovascular status: blood pressure returned to baseline and stable Postop Assessment: no apparent nausea or vomiting Anesthetic complications: no   No notable events documented.  Last Vitals:  Vitals:   02/19/22 1345 02/19/22 1414  BP:  136/87  Pulse: (!) 54 (!) 57  Resp: 13 18  Temp:  36.6 C  SpO2: 96% 99%    Last Pain:  Vitals:   02/19/22 1414  TempSrc: Oral  PainSc:                  Tecia Cinnamon L Rylei Codispoti

## 2022-02-20 DIAGNOSIS — M48061 Spinal stenosis, lumbar region without neurogenic claudication: Secondary | ICD-10-CM | POA: Diagnosis not present

## 2022-02-20 MED ORDER — OXYCODONE HCL 10 MG PO TABS: 10.0000 mg | ORAL_TABLET | ORAL | 0 refills | Status: DC | PRN

## 2022-02-20 MED ORDER — TIZANIDINE HCL 2 MG PO TABS: 2.0000 mg | ORAL_TABLET | Freq: Four times a day (QID) | ORAL | 0 refills | Status: DC | PRN

## 2022-02-20 NOTE — Evaluation (Signed)
Physical Therapy Evaluation Patient Details Name: Amy Williamson MRN: [id number] DOB: 1951-02-05 Today's Date: 02/20/2022  History of Present Illness  Pt is a 71 y/o F s/p bilateral L4-5 decompressive laminectomies/foraminectomies. PMH includes GERD, OSA, essential HTN, and arthritis.   Clinical Impression  Pt admitted with above diagnosis. At the time of PT eval, pt was able to demonstrate transfers and ambulation with gross min guard assist and RW for support. Pt was educated on precautions, brace application/wearing schedule, appropriate activity progression, and car transfer. Pt currently with functional limitations due to the deficits listed below (see PT Problem List). Pt will benefit from skilled PT to increase their independence and safety with mobility to allow discharge to the venue listed below.         Recommendations for follow up therapy are one component of a multi-disciplinary discharge planning process, led by the attending physician.  Recommendations may be updated based on patient status, additional functional criteria and insurance authorization.  Follow Up Recommendations No PT follow up      Assistance Recommended at Discharge Intermittent Supervision/Assistance  Patient can return home with the following  A little help with walking and/or transfers;A little help with bathing/dressing/bathroom;Assistance with cooking/housework;Assist for transportation;Help with stairs or ramp for entrance    Equipment Recommendations None recommended by PT  Recommendations for Other Services       Functional Status Assessment Patient has had a recent decline in their functional status and demonstrates the ability to make significant improvements in function in a reasonable and predictable amount of time.     Precautions / Restrictions Precautions Precautions: Fall;Back Precaution Booklet Issued: Yes (comment) Precaution Comments: educated on 3/3 back precautions Required Braces  or Orthoses: Spinal Brace Spinal Brace: Lumbar corset;Applied in sitting position Restrictions Weight Bearing Restrictions: No      Mobility  Bed Mobility Overal bed mobility: Modified Independent             General bed mobility comments: HOB flat and rails lowered to simulate home environment.    Transfers Overall transfer level: Needs assistance Equipment used: Rolling walker (2 wheels) Transfers: Sit to/from Stand Sit to Stand: Supervision           General transfer comment: VC's for hand placement on seated surface for safety. No assist required.    Ambulation/Gait Ambulation/Gait assistance: Min guard Gait Distance (Feet): 200 Feet Assistive device: Rolling walker (2 wheels) Gait Pattern/deviations: Step-through pattern, Decreased stride length, Trunk flexed Gait velocity: Decreased Gait velocity interpretation: 1.31 - 2.62 ft/sec, indicative of limited community ambulator   General Gait Details: VC's for improved posture, closer walker proximity, and forward gaze. No assist required however hands on guarding provided throughout for safety .  Stairs Stairs: Yes Stairs assistance: Min guard Stair Management: Step to pattern, Forwards, One rail Right Number of Stairs: 2 General stair comments: VC's for general sequencing and safety.  Wheelchair Mobility    Modified Rankin (Stroke Patients Only)       Balance Overall balance assessment: Mild deficits observed, not formally tested                                           Pertinent Vitals/Pain Pain Assessment Pain Assessment: Faces Faces Pain Scale: Hurts little more Pain Location: back at incision Pain Descriptors / Indicators: Sore Pain Intervention(s): Limited activity within patient's tolerance, Monitored during session, Repositioned  Home Living Family/patient expects to be discharged to:: Private residence Living Arrangements: Spouse/significant other Available Help  at Discharge: Family;Available 24 hours/day Type of Home: House Home Access: Stairs to enter   [employer] of Steps: 2   Home Layout: One level Home Equipment: Shower seat - built in;BSC/3in1;Toilet riser;Rolling Walker (2 wheels);Cane - single point      Prior Function Prior Level of Function : Independent/Modified Independent             Mobility Comments: no AD use ADLs Comments: reports spouse occasionally assists with UB bathing/LB dressing     Hand Dominance        Extremity/Trunk Assessment   Upper Extremity Assessment Upper Extremity Assessment: Defer to OT evaluation    Lower Extremity Assessment Lower Extremity Assessment: Generalized weakness (Mild; consistent with pre-op diagnosis.)    Cervical / Trunk Assessment Cervical / Trunk Assessment: Back Surgery  Communication   Communication: No difficulties  Cognition Arousal/Alertness: Awake/alert Behavior During Therapy: WFL for tasks assessed/performed Overall Cognitive Status: Within Functional Limits for tasks assessed                                          General Comments      Exercises     Assessment/Plan    PT Assessment Patient needs continued PT services  PT Problem List Decreased strength;Decreased activity tolerance;Decreased balance;Decreased mobility;Decreased knowledge of use of DME;Decreased safety awareness;Decreased knowledge of precautions;Pain       PT Treatment Interventions DME instruction;Gait training;Stair training;Functional mobility training;Therapeutic activities;Therapeutic exercise;Balance training;Patient/family education    PT Goals (Current goals can be found in the Care Plan section)  Acute Rehab PT Goals Patient Stated Goal: Home today PT Goal Formulation: With patient/family Time For Goal Achievement: 02/27/22 Potential to Achieve Goals: Good    Frequency Min 5X/week     Co-evaluation               AM-PAC PT "6  Clicks" Mobility  Outcome Measure Help needed turning from your back to your side while in a flat bed without using bedrails?: None Help needed moving from lying on your back to sitting on the side of a flat bed without using bedrails?: None Help needed moving to and from a bed to a chair (including a wheelchair)?: A Little Help needed standing up from a chair using your arms (e.g., wheelchair or bedside chair)?: A Little Help needed to walk in hospital room?: A Little Help needed climbing 3-5 steps with a railing? : A Little 6 Click Score: 20    End of Session Equipment Utilized During Treatment: Gait belt Activity Tolerance: Patient tolerated treatment well Patient left: in bed;with call bell/phone within reach;with family/visitor present Nurse Communication: Mobility status PT Visit Diagnosis: Unsteadiness on feet (R26.81);Pain Pain - part of body:  (back)    Time: 7622-6333 PT Time Calculation (min) (ACUTE ONLY): 13 min   Charges:   PT Evaluation $PT Eval Low Complexity: 1 Low          Rolinda Roan, PT, DPT Acute Rehabilitation Services Secure Chat Preferred Office: [phone]   Thelma Comp 02/20/2022, 12:59 PM

## 2022-02-20 NOTE — Discharge Instructions (Addendum)
Wound Care Keep incision covered and dry for three days.   Do not put any creams, lotions, or ointments on incision. Leave steri-strips on back.  They will fall off by themselves. Activity Walk each and every day, increasing distance each day. No lifting greater than 5 lbs.  Avoid excessive neck motion. No driving for 2 weeks; may ride as a passenger locally. If provided with back brace, wear when out of bed.  It is not necessary to wear brace in bed. Diet Resume your normal diet.   Call Your Doctor If Any of These Occur Redness, drainage, or swelling at the wound.  Temperature greater than 101 degrees. Severe pain not relieved by pain medication. Incision starts to come apart. Follow Up Appt Call today for appointment in 1-2 weeks ([phone]) or for problems.  If you have any hardware placed in your spine, you will need an x-ray before your appointment.   RESTART ELIQUIS on FRIDAY

## 2022-02-20 NOTE — Evaluation (Signed)
Occupational Therapy Evaluation Patient Details Name: Amy Williamson MRN: [id number] DOB: 10/04/1950 Today's Date: 02/20/2022   History of Present Illness Pt is a 71 y/o F s/p bilateral L4-5 decompressive laminectomies/foraminectomies. PMH includes GERD, OSA, essential HTN, and arthritis.   Clinical Impression   Pt needing occasional assist with ADLs at home from spouse, ind with mobility. Lives with spouse who can provide frequent assist at d/c. Pt currently needing min-max A for ADLs, increased assistance for LB ADL. Mod I for bed mobility, and supervision for transfers without AD. Pt educated on precautions, brace wear, and compensatory strategies for ADLs, handout provided. Pt and spouse verbalized understanding, adheres to precautions well throughout session. Pt presenting with impairments listed below, however has no acute OT needs at this time, will s/o. Recommend d/c home with family assistance.     Recommendations for follow up therapy are one component of a multi-disciplinary discharge planning process, led by the attending physician.  Recommendations may be updated based on patient status, additional functional criteria and insurance authorization.   Follow Up Recommendations  No OT follow up    Assistance Recommended at Discharge Intermittent Supervision/Assistance  Patient can return home with the following A lot of help with bathing/dressing/bathroom;Assistance with cooking/housework;Help with stairs or ramp for entrance;Assist for transportation    Functional Status Assessment  Patient has had a recent decline in their functional status and demonstrates the ability to make significant improvements in function in a reasonable and predictable amount of time.  Equipment Recommendations  None recommended by OT;Other (comment) (pt has all needed DME)    Recommendations for Other Services PT consult     Precautions / Restrictions Precautions Precautions: Fall;Back Precaution  Booklet Issued: Yes (comment) Precaution Comments: educated on 3/3 back precautions Required Braces or Orthoses: Spinal Brace Spinal Brace: Lumbar corset;Applied in sitting position Restrictions Weight Bearing Restrictions: No      Mobility Bed Mobility Overal bed mobility: Modified Independent             General bed mobility comments: log rolling technique    Transfers Overall transfer level: Needs assistance Equipment used: None Transfers: Sit to/from Stand Sit to Stand: Supervision                  Balance Overall balance assessment: Mild deficits observed, not formally tested                                         ADL either performed or assessed with clinical judgement   ADL Overall ADL's : Needs assistance/impaired Eating/Feeding: Modified independent   Grooming: Modified independent   Upper Body Bathing: Minimal assistance;Sitting   Lower Body Bathing: Maximal assistance;Moderate assistance;Sitting/lateral leans   Upper Body Dressing : Minimal assistance;Sitting   Lower Body Dressing: Maximal assistance;Moderate assistance;Sitting/lateral leans   Toilet Transfer: Ambulation;Regular Toilet;Supervision/safety   Toileting- Clothing Manipulation and Hygiene: Supervision/safety;Sitting/lateral lean;Sit to/from stand       Functional mobility during ADLs: Supervision/safety       Vision   Vision Assessment?: No apparent visual deficits     Perception     Praxis      Pertinent Vitals/Pain Pain Assessment Pain Assessment: Faces Pain Score: 3  Faces Pain Scale: Hurts little more Pain Location: back at incision Pain Descriptors / Indicators: Sore Pain Intervention(s): Limited activity within patient's tolerance, Monitored during session, Repositioned     Hand Dominance  Extremity/Trunk Assessment Upper Extremity Assessment Upper Extremity Assessment: Overall WFL for tasks assessed   Lower Extremity  Assessment Lower Extremity Assessment: Defer to PT evaluation   Cervical / Trunk Assessment Cervical / Trunk Assessment: Back Surgery   Communication Communication Communication: No difficulties   Cognition Arousal/Alertness: Awake/alert Behavior During Therapy: WFL for tasks assessed/performed Overall Cognitive Status: Within Functional Limits for tasks assessed                                       General Comments  VSS on RA; spouse present in room    Exercises     Shoulder Instructions      Home Living Family/patient expects to be discharged to:: Private residence Living Arrangements: Spouse/significant other Available Help at Discharge: Family;Available 24 hours/day Type of Home: House Home Access: Stairs to enter [employer] of Steps: 2   Home Layout: One level     Bathroom Shower/Tub: Walk-in shower         Home Equipment: Shower seat - built in;BSC/3in1;Toilet riser;Rolling Walker (2 wheels);Cane - single point          Prior Functioning/Environment Prior Level of Function : Independent/Modified Independent             Mobility Comments: no AD use ADLs Comments: reports spouse occasionally assists with UB bathing/LB dressing        OT Problem List: Decreased strength;Decreased range of motion;Decreased activity tolerance;Impaired balance (sitting and/or standing);Decreased knowledge of precautions      OT Treatment/Interventions:      OT Goals(Current goals can be found in the care plan section) Acute Rehab OT Goals Patient Stated Goal: none stated OT Goal Formulation: With patient Time For Goal Achievement: 03/06/22 Potential to Achieve Goals: Good  OT Frequency:      Co-evaluation              AM-PAC OT "6 Clicks" Daily Activity     Outcome Measure Help from another person eating meals?: None Help from another person taking care of personal grooming?: None Help from another person toileting, which  includes using toliet, bedpan, or urinal?: None Help from another person bathing (including washing, rinsing, drying)?: A Lot Help from another person to put on and taking off regular upper body clothing?: A Little Help from another person to put on and taking off regular lower body clothing?: A Lot 6 Click Score: 19   End of Session Equipment Utilized During Treatment: Back brace Nurse Communication: Mobility status  Activity Tolerance: Patient tolerated treatment well Patient left: in bed;with call bell/phone within reach;with family/visitor present  OT Visit Diagnosis: Unsteadiness on feet (R26.81);Other abnormalities of gait and mobility (R26.89);Muscle weakness (generalized) (M62.81)                Time: 1610-9604 OT Time Calculation (min): 24 min Charges:  OT General Charges $OT Visit: 1 Visit OT Evaluation $OT Eval Low Complexity: 1 Low OT Treatments $Self Care/Home Management : 8-22 mins  Lynnda Child, OTD, OTR/L Acute Rehab [phone]) [phone] [place] 02/20/2022, 8:42 AM

## 2022-02-20 NOTE — Discharge Summary (Signed)
Physician Discharge Summary  Patient ID: Amy Williamson MRN: [id number] DOB/AGE: 08-19-1950 71 y.o.  Admit date: 02/19/2022 Discharge date: 02/20/2022  Admission Diagnoses:  Discharge Diagnoses:  Principal Problem:   Degenerative spondylolisthesis   Discharged Condition: good  Hospital Course: Patient admitted to the hospital where she underwent uncomplicated E9-9 decompression and fusion surgery.  Postoperatively doing very well.  Preoperative back and severe bilateral lower extremity pain much improved.  Standing ambulating and voiding without difficulty.  Patient feels ready for discharge home.  Consults:   Significant Diagnostic Studies:   Treatments:   Discharge Exam: Blood pressure (!) 125/57, pulse 78, temperature 99.1 F (37.3 C), temperature source Oral, resp. rate 16, height 5' (1.524 m), weight 92.1 kg, last menstrual period 07/23/2004, SpO2 95 %. Awake and alert.  Oriented and appropriate.  Motor and sensory function intact.  Wound clean and dry.  Chest and abdomen benign.  Disposition: Discharge disposition: 01-Home or Self Care        Allergies as of 02/20/2022       Reactions   Tramadol Itching, Rash   May have been shingles instead of allergy per patient   Atenolol    Decreased blood pressure   Avelox [moxifloxacin Hcl In Nacl]    Pt did not like side effects   Azithromycin    ?causes muscle weakness, dysphagia   Doxycycline Nausea Only   And heartburn   Myrbetriq [mirabegron]    Increased blood pressure   Naproxen    Can not take while on Eliquis   Oxybutynin Palpitations   Per pt made her have rapid heart beat        Medication List     STOP taking these medications    valACYclovir 1000 MG tablet Commonly known as: VALTREX       TAKE these medications    acetaminophen 650 MG CR tablet Commonly known as: TYLENOL Take 650 mg by mouth in the morning and at bedtime.   diltiazem 240 MG 24 hr capsule Commonly known as: CARDIZEM  CD TAKE 1 CAPSULE BY MOUTH EVERY DAY   Eliquis 5 MG Tabs tablet Generic drug: apixaban TAKE 1 TABLET BY MOUTH TWICE A DAY   flecainide 50 MG tablet Commonly known as: TAMBOCOR Take 0.5 tablets (25 mg total) by mouth 2 (two) times daily as needed (breakthrough afib).   fluticasone 50 MCG/ACT nasal spray Commonly known as: FLONASE Place 2 sprays into both nostrils daily as needed for allergies or rhinitis.   omeprazole 20 MG capsule Commonly known as: PRILOSEC TAKE 1 CAPSULE BY MOUTH EVERY DAY   Oxycodone HCl 10 MG Tabs Take 1 tablet (10 mg total) by mouth every 3 (three) hours as needed for severe pain ((score 7 to 10)).   rosuvastatin 20 MG tablet Commonly known as: CRESTOR TAKE 1 TABLET BY MOUTH EVERY DAY   tiZANidine 2 MG tablet Commonly known as: ZANAFLEX Take 1 tablet (2 mg total) by mouth every 6 (six) hours as needed for muscle spasms.   traZODone 50 MG tablet Commonly known as: DESYREL TAKE 1/2 TO 1 TABLET BY MOUTH AT BEDTIME AS NEEDED FOR SLEEP               Durable Medical Equipment  (From admission, onward)           Start     Ordered   02/19/22 1426  DME Walker rolling  Once       Question:  Patient needs a walker to treat with  the following condition  Answer:  Degenerative spondylolisthesis   02/19/22 1425   02/19/22 1426  DME 3 n 1  Once        02/19/22 1425             Signed: [place] 02/20/2022, 9:23 AM

## 2022-02-20 NOTE — Progress Notes (Signed)
Patient alert and oriented, mae's well, voiding adequate amount of urine, swallowing without difficulty, no c/o pain at time of discharge. Patient discharged home with family. Script and discharged instructions given to patient. Patient and family stated understanding of instructions given. Patient has an appointment with Dr. Pool in 2 weeks 

## 2022-02-21 ENCOUNTER — Telehealth: Payer: Self-pay

## 2022-02-21 NOTE — Telephone Encounter (Signed)
The patient has been seen by both Dr. Quentin Ore and Dr. Gasper Sells. She is scheduled for PVI 9/7 and LAAO to be planned about 8 weeks later.  Per Dr. Oralia Rud 02/09/22 office note, "Timing will be difficulty: she will need at least 30 days of uninterrupted AC prior to her 03/29/22 ablation, will need return to [facility] post back surgery when Dr. Annette Stable and team feel it is safe Presently planned for  [facility] stop 7/28, surgery 7/31, AF ablation 03/29/22, consideration for [facility] and pre planning CT, then [facility]man FLX and likely post planning CT"  Called and discussed with patient. She had her back surgery earlier this week and states she is doing fairly well. She reports Dr. Annette Stable instructed her to restart her Eliquis this Friday, 8/4.  She understands restarting 8/4 will allow 1 month of uninterrupted a/c prior to 9/7 ablation. She also understands to call if she does not restart Eliquis or has issues and needs to stop it. Otherwise, plan will continue for ablation/LAAO. She was grateful for follow-up.

## 2022-02-23 MED FILL — Sodium Chloride IV Soln 0.9%: INTRAVENOUS | Qty: 1000 | Status: AC

## 2022-02-23 MED FILL — Heparin Sodium (Porcine) Inj 1000 Unit/ML: INTRAMUSCULAR | Qty: 30 | Status: AC

## 2022-02-26 ENCOUNTER — Emergency Department (HOSPITAL_COMMUNITY)
Admission: EM | Admit: 2022-02-26 | Discharge: 2022-02-26 | Disposition: A | Discharge status: Home / Self Care | Payer: Medicare Other | Attending: Emergency Medicine | Admitting: Emergency Medicine

## 2022-02-26 ENCOUNTER — Encounter (HOSPITAL_COMMUNITY): Payer: Self-pay | Admitting: Emergency Medicine

## 2022-02-26 DIAGNOSIS — R110 Nausea: Secondary | ICD-10-CM | POA: Diagnosis not present

## 2022-02-26 DIAGNOSIS — K5900 Constipation, unspecified: Secondary | ICD-10-CM | POA: Diagnosis present

## 2022-02-26 DIAGNOSIS — Z7901 Long term (current) use of anticoagulants: Secondary | ICD-10-CM | POA: Diagnosis not present

## 2022-02-26 DIAGNOSIS — K5903 Drug induced constipation: Secondary | ICD-10-CM | POA: Diagnosis not present

## 2022-02-26 DIAGNOSIS — I10 Essential (primary) hypertension: Secondary | ICD-10-CM | POA: Diagnosis not present

## 2022-02-26 LAB — COMPREHENSIVE METABOLIC PANEL
ALT: 12 U/L (ref 0–44)
AST: 14 U/L — ABNORMAL LOW (ref 15–41)
Albumin: 3.4 g/dL — ABNORMAL LOW (ref 3.5–5.0)
Alkaline Phosphatase: 72 U/L (ref 38–126)
Anion gap: 10 (ref 5–15)
BUN: 10 mg/dL (ref 8–23)
CO2: 30 mmol/L (ref 22–32)
Calcium: 9.4 mg/dL (ref 8.9–10.3)
Chloride: 98 mmol/L (ref 98–111)
Creatinine, Ser: 0.84 mg/dL (ref 0.44–1.00)
GFR, Estimated: >60 mL/min (ref >60)
Glucose, Bld: 128 mg/dL — ABNORMAL HIGH (ref 70–99)
Potassium: 4 mmol/L (ref 3.5–5.1)
Sodium: 138 mmol/L (ref 135–145)
Total Bilirubin: 0.5 mg/dL (ref 0.3–1.2)
Total Protein: 6.2 g/dL — ABNORMAL LOW (ref 6.5–8.1)

## 2022-02-26 LAB — LIPASE, BLOOD: Lipase: 21 U/L (ref 11–51)

## 2022-02-26 LAB — CBC
HCT: 37.5 % (ref 36.0–46.0)
Hemoglobin: 12.2 g/dL (ref 12.0–15.0)
MCH: 27.5 pg (ref 26.0–34.0)
MCHC: 32.5 g/dL (ref 30.0–36.0)
MCV: 84.7 fL (ref 80.0–100.0)
Platelets: 259 10*3/uL (ref 150–400)
RBC: 4.43 MIL/uL (ref 3.87–5.11)
RDW: 15.4 % (ref 11.5–15.5)
WBC: 9.2 10*3/uL (ref 4.0–10.5)
nRBC: 0 % (ref 0.0–0.2)

## 2022-02-26 MED ORDER — ONDANSETRON HCL 4 MG PO TABS: 4.0000 mg | ORAL_TABLET | Freq: Four times a day (QID) | ORAL | 0 refills | Status: DC

## 2022-02-26 MED ORDER — FLEET ENEMA 7-19 GM/118ML RE ENEM
1.0000 | ENEMA | Freq: Once | RECTAL | Status: AC
  Administered 2022-02-26: 1 via RECTAL
  Filled 2022-02-26: qty 1

## 2022-02-26 MED ORDER — FLEET ENEMA 7-19 GM/118ML RE ENEM: 1.0000 | ENEMA | Freq: Every day | RECTAL | 0 refills | Status: DC | PRN

## 2022-02-26 NOTE — ED Triage Notes (Signed)
Patient here with compliant of constipation since Monday one week ago. Patient states she had back surgery on Monday and has not had a bowel movement since. Patient has tried multiple OTC laxatives and stool softeners since then with not relief of symptoms. Patient complains of abdominal pain, denies nausea and vomiting.

## 2022-02-26 NOTE — Discharge Instructions (Addendum)
You were seen in the emergency department today for constipation.  While you were here we gave you an enema which helped to relieve some of your bowels.  I have prescribed you 2 more doses of the enema that you can use daily as needed.  Please continue to use her stool softeners and suppositories until you are having multiple, soft bowel movements a day and then taper back.  I am also prescribing you Zofran which she can use for nausea.  Please return to the emergency department if you are having significant increase in abdominal pain.

## 2022-02-26 NOTE — ED Provider Notes (Signed)
[facility] EMERGENCY DEPARTMENT Provider Note   CSN: [id number] Arrival date & time: 02/26/22  0813     History  Chief Complaint  Patient presents with   Constipation    Amy Williamson is a 71 y.o. female.  With past medical history of GERD, hypertension, arthritis, paroxysmal atrial fibrillation who presents to the emergency department for constipation.  States that she had a lumbar fusion on Monday, 02/19/2022.  States that since her procedure she has not had a bowel movement.  She states that she has had 1 episode of a very small amount of hard stool come out.  She is passing gas.  She has tried using Dulcolax, MiraLAX, suppositories without relief of symptoms.  She has had some very mild intermittent nausea but no vomiting.  She is having sense of fullness and pressure when she is seated but no abdominal pain.  She denies any fevers.  Has been taking oxycodone postoperatively.  Denies any urinary symptoms or vaginal discharge.  Denies weakness or numbness to her lower extremities or groin.  Denies incontinence.   Constipation Associated symptoms: nausea   Associated symptoms: no abdominal pain, no dysuria, no fever and no vomiting        Home Medications Prior to Admission medications   Medication Sig Start Date End Date Taking? Authorizing Provider  acetaminophen (TYLENOL) 650 MG CR tablet Take 650 mg by mouth in the morning and at bedtime.    [provider]  diltiazem (CARDIZEM CD) 240 MG 24 hr capsule TAKE 1 CAPSULE BY MOUTH EVERY DAY 11/30/21   Werner Lean, MD  ELIQUIS 5 MG TABS tablet TAKE 1 TABLET BY MOUTH TWICE A DAY 01/22/22   Hoyt Koch, MD  flecainide (TAMBOCOR) 50 MG tablet Take 0.5 tablets (25 mg total) by mouth 2 (two) times daily as needed (breakthrough afib). 12/12/21   Fenton, Clint R, PA  fluticasone (FLONASE) 50 MCG/ACT nasal spray Place 2 sprays into both nostrils daily as needed for allergies or rhinitis.     [provider]  omeprazole (PRILOSEC) 20 MG capsule TAKE 1 CAPSULE BY MOUTH EVERY DAY 12/15/21   Hoyt Koch, MD  oxyCODONE 10 MG TABS Take 1 tablet (10 mg total) by mouth every 3 (three) hours as needed for severe pain ((score 7 to 10)). 02/20/22   Earnie Larsson, MD  rosuvastatin (CRESTOR) 20 MG tablet TAKE 1 TABLET BY MOUTH EVERY DAY 02/05/22   Chandrasekhar, Mahesh A, MD  tiZANidine (ZANAFLEX) 2 MG tablet Take 1 tablet (2 mg total) by mouth every 6 (six) hours as needed for muscle spasms. 02/20/22   Earnie Larsson, MD  traZODone (DESYREL) 50 MG tablet TAKE 1/2 TO 1 TABLET BY MOUTH AT BEDTIME AS NEEDED FOR SLEEP 02/07/22   Hoyt Koch, MD      Allergies    Tramadol, Atenolol, Avelox [moxifloxacin hcl in nacl], Azithromycin, Doxycycline, Myrbetriq [mirabegron], Naproxen, and Oxybutynin    Review of Systems   Review of Systems  Constitutional:  Positive for appetite change. Negative for fever.  Gastrointestinal:  Positive for constipation and nausea. Negative for abdominal pain and vomiting.  Genitourinary:  Negative for dysuria and vaginal discharge.  All other systems reviewed and are negative.   Physical Exam Updated Vital Signs BP (!) 154/73   Pulse 84   Temp 98.6 F (37 C)   Resp 20   LMP 07/23/2004   SpO2 98%  Physical Exam Vitals and nursing note reviewed. Exam conducted  with a chaperone present.  Constitutional:      General: She is not in acute distress.    Appearance: Normal appearance. She is obese. She is not ill-appearing or toxic-appearing.  HENT:     Head: Normocephalic.     Mouth/Throat:     Mouth: Mucous membranes are dry.     Pharynx: Oropharynx is clear.  Eyes:     General: No scleral icterus.    Extraocular Movements: Extraocular movements intact.     Pupils: Pupils are equal, round, and reactive to light.  Cardiovascular:     Rate and Rhythm: Normal rate and regular rhythm.  Pulmonary:     Effort: Pulmonary effort is normal. No  respiratory distress.  Abdominal:     General: Abdomen is protuberant. Bowel sounds are normal. There is no distension.     Palpations: Abdomen is soft.     Tenderness: There is no abdominal tenderness.  Genitourinary:    Rectum: Normal. Normal anal tone.     Comments: Moderate amount of stool in the rectal vault. Was able to digitally disimpact a small amount of stool. Tolerated with some difficulty and would prefer trial of enema.  Musculoskeletal:        General: Normal range of motion.     Cervical back: Neck supple.  Skin:    General: Skin is warm and dry.     Capillary Refill: Capillary refill takes less than 2 seconds.  Neurological:     General: No focal deficit present.     Mental Status: She is alert and oriented to person, place, and time. Mental status is at baseline.  Psychiatric:        Mood and Affect: Mood normal.        Behavior: Behavior normal.        Thought Content: Thought content normal.        Judgment: Judgment normal.    ED Results / Procedures / Treatments   Labs (all labs ordered are listed, but only abnormal results are displayed) Labs Reviewed  COMPREHENSIVE METABOLIC PANEL - Abnormal; Notable for the following components:      Result Value   Glucose, Bld 128 (*)    Total Protein 6.2 (*)    Albumin 3.4 (*)    AST 14 (*)    All other components within normal limits  LIPASE, BLOOD  CBC  URINALYSIS, ROUTINE W REFLEX MICROSCOPIC   EKG None  Radiology No results found.  Procedures Fecal disimpaction  Date/Time: 02/26/2022 2:23 PM  Performed by: Mickie Hillier, PA-C Authorized by: Mickie Hillier, PA-C  Consent: Verbal consent obtained. Risks and benefits: risks, benefits and alternatives were discussed Consent given by: patient Patient understanding: patient states understanding of the procedure being performed Patient consent: the patient's understanding of the procedure matches consent given Procedure consent: procedure consent matches  procedure scheduled Relevant documents: relevant documents present and verified Test results: test results available and properly labeled Site marked: the operative site was marked Imaging studies: imaging studies available Patient identity confirmed: verbally with patient Local anesthesia used: no  Anesthesia: Local anesthesia used: no  Sedation: Patient sedated: no  Patient tolerance: patient tolerated the procedure well with no immediate complications     Medications Ordered in ED Medications  sodium phosphate (FLEET) 7-19 GM/118ML enema 1 enema (1 enema Rectal Given 02/26/22 1228)    ED Course/ Medical Decision Making/ A&P  Medical Decision Making Amount and/or Complexity of Data Reviewed Labs: ordered.  Risk OTC drugs. Prescription drug management.  This patient presents to the ED with chief complaint(s) of constipation with pertinent past medical history of recent lumbar fusion, hypertension, paroxysmal atrial fibrillation which further complicates the presenting complaint. The complaint involves an extensive differential diagnosis and also carries with it a high risk of complications and morbidity.    The differential diagnosis includes electrolyte dysfunction, dehydration, impaction, ileus, bowel obstruction  The initial plan is to basic labs to assess electrolytes, rectal exam to assess for impaction   Additional history obtained: Additional history obtained from family Records reviewed Care Everywhere/External Records  Independent labs interpretation:  The following labs were independently interpreted: CBC without leukocytosis, CMP without electrolyte derangement, transaminitis, AKI.  Does not appear to be dehydrated on her labs.  Lipase is negative.  Independent visualization of imaging: - I independently visualized the following imaging with scope of interpretation limited to determining acute life threatening conditions related to  emergency care: N/A, which revealed N/A  Treatment and Reassessment: Digitally disimpacted, with chaperone present, a small amount of stool.  There is moderate amount of stool in the rectal vault. Trial of Fleet enema resulted in multiple bowel movements.  Patient feeling slightly improved and is able to tolerate p.o. fluids.  We will prescribe her Fleet enemas for home as well as she can continue her Colace and suppository laxatives.  Instructed her to use medications until she is having multiple soft bowel movements a day and then is able to taper back.  Also discussed that the postoperative oxycodone that she is using is likely contributing to her constipation and to taper back as tolerated with her pain.  She has a follow-up appointment with her neurosurgeon on Thursday.  Can discuss ongoing management at that point.  Given return precautions for worsening abdominal pain, distention, abdominal pain with fevers or inability to tolerate p.o.  She verbalized understanding.  Consultation: - Consulted or discussed management/test interpretation w/ external professional: N/A  Consideration for admission or further workup: No further work-up, imaging or admission needed at this time Social Determinants of health: None identified Final Clinical Impression(s) / ED Diagnoses Final diagnoses:  Drug-induced constipation    Rx / DC Orders ED Discharge Orders          Ordered    sodium phosphate (FLEET) 7-19 GM/118ML ENEM  Daily PRN        02/26/22 1421    ondansetron (ZOFRAN) 4 MG tablet  Every 6 hours        02/26/22 1421              Mickie Hillier, PA-C 02/26/22 1425    Fredia Sorrow, MD 03/02/22 1158

## 2022-03-08 ENCOUNTER — Other Ambulatory Visit: Payer: Medicare Other

## 2022-03-08 DIAGNOSIS — I10 Essential (primary) hypertension: Secondary | ICD-10-CM

## 2022-03-08 DIAGNOSIS — I4819 Other persistent atrial fibrillation: Secondary | ICD-10-CM

## 2022-03-08 DIAGNOSIS — G4733 Obstructive sleep apnea (adult) (pediatric): Secondary | ICD-10-CM

## 2022-03-08 LAB — BASIC METABOLIC PANEL
BUN/Creatinine Ratio: 20 (ref 12–28)
BUN: 16 mg/dL (ref 8–27)
CO2: 31 mmol/L — ABNORMAL HIGH (ref 20–29)
Calcium: 9.6 mg/dL (ref 8.7–10.3)
Chloride: 101 mmol/L (ref 96–106)
Creatinine, Ser: 0.8 mg/dL (ref 0.57–1.00)
Glucose: 129 mg/dL — ABNORMAL HIGH (ref 70–99)
Potassium: 4.6 mmol/L (ref 3.5–5.2)
Sodium: 140 mmol/L (ref 134–144)
eGFR: 78 mL/min/{1.73_m2} (ref >59)

## 2022-03-08 LAB — CBC WITH DIFFERENTIAL/PLATELET
Basophils Absolute: 0 10*3/uL (ref 0.0–0.2)
Basos: 0 %
EOS (ABSOLUTE): 0.3 10*3/uL (ref 0.0–0.4)
Eos: 4 %
Hematocrit: 35.6 % (ref 34.0–46.6)
Hemoglobin: 11.7 g/dL (ref 11.1–15.9)
Lymphocytes Absolute: 1.9 10*3/uL (ref 0.7–3.1)
Lymphs: 26 %
MCH: 27.3 pg (ref 26.6–33.0)
MCHC: 32.9 g/dL (ref 31.5–35.7)
MCV: 83 fL (ref 79–97)
Monocytes Absolute: 0.6 10*3/uL (ref 0.1–0.9)
Monocytes: 9 %
Neutrophils Absolute: 4.4 10*3/uL (ref 1.4–7.0)
Neutrophils: 61 %
Platelets: 345 10*3/uL (ref 150–450)
RBC: 4.29 x10E6/uL (ref 3.77–5.28)
RDW: 16.9 % — ABNORMAL HIGH (ref 11.7–15.4)
WBC: 7.3 10*3/uL (ref 3.4–10.8)

## 2022-03-13 ENCOUNTER — Telehealth: Payer: Self-pay | Admitting: *Deleted

## 2022-03-13 NOTE — Telephone Encounter (Signed)
Spoke with patient and advised her of the procedure date change from 03/29/22 to 03/30/22.  She is aware to be at the hospital at 8:30 am on 03/30/22.  She verbalized understanding of change.

## 2022-03-18 IMAGING — US US PELVIS COMPLETE WITH TRANSVAGINAL
1 series · 13 of 25 positions shown · non-contrast
Comparison: None

CLINICAL DATA: Postmenopausal bleeding, takes Eliquis

EXAM:
TRANSABDOMINAL AND TRANSVAGINAL ULTRASOUND OF PELVIS
TECHNIQUE: Both transabdominal and transvaginal ultrasound examinations of the
pelvis were performed. Transabdominal technique was performed for
global imaging of the pelvis including uterus, ovaries, adnexal
regions, and pelvic cul-de-sac. It was necessary to proceed with
endovaginal exam following the transabdominal exam to visualize the
endometrium and ovaries.

[Series 1: us pelvic complete with transvaginal · 13 of 65 slices shown]
[im 1/65]
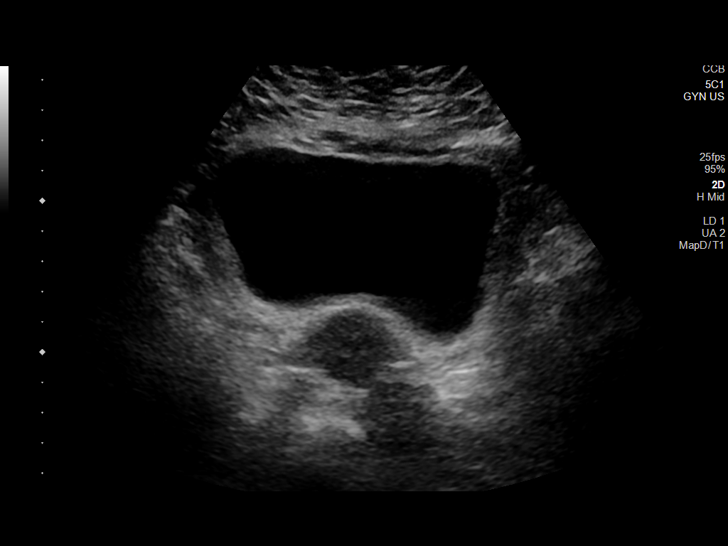
[im 6/65]
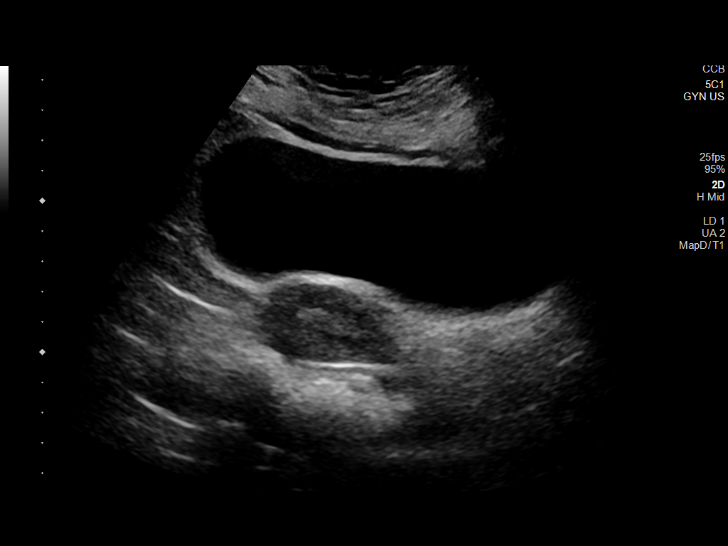
[im 11/65]
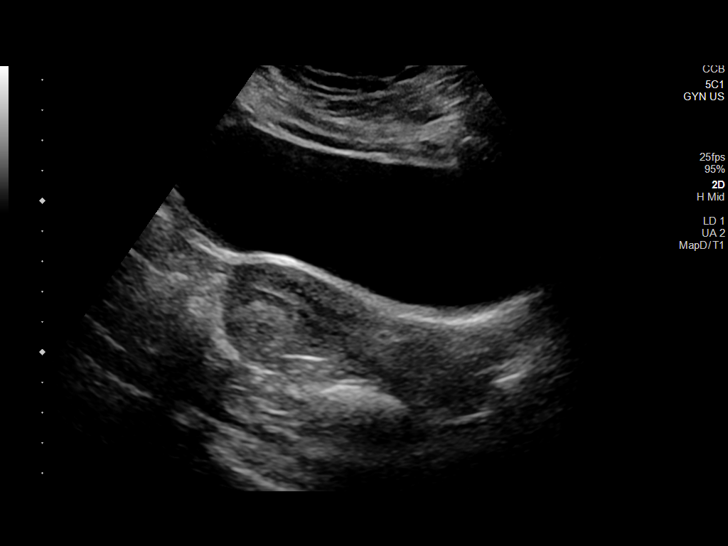
[im 17/65]
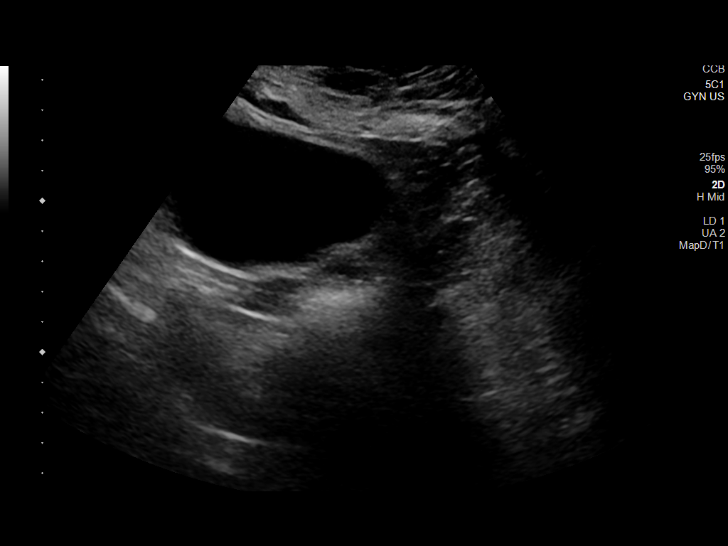
[im 22/65]
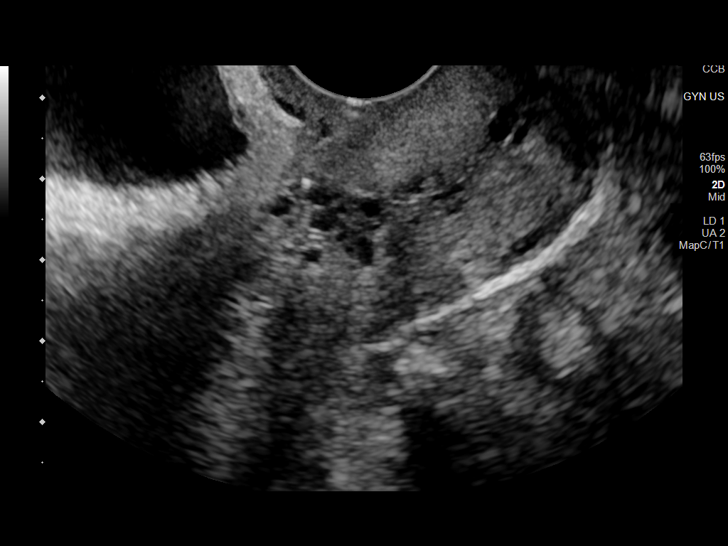
[im 27/65]
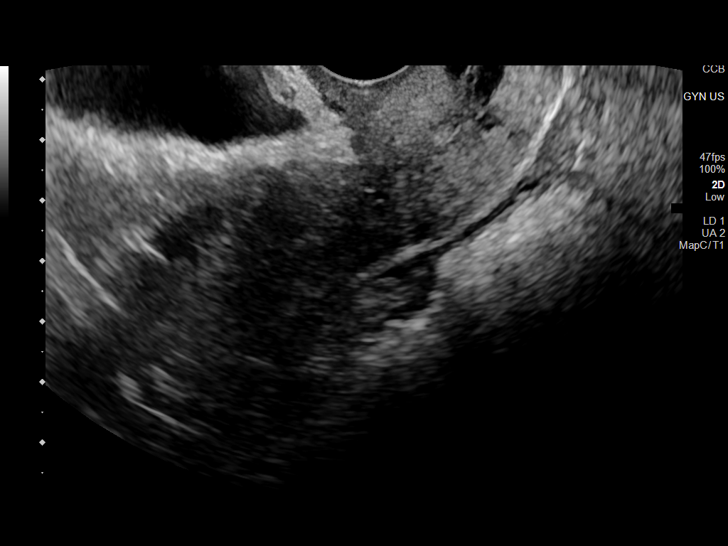
[im 33/65]
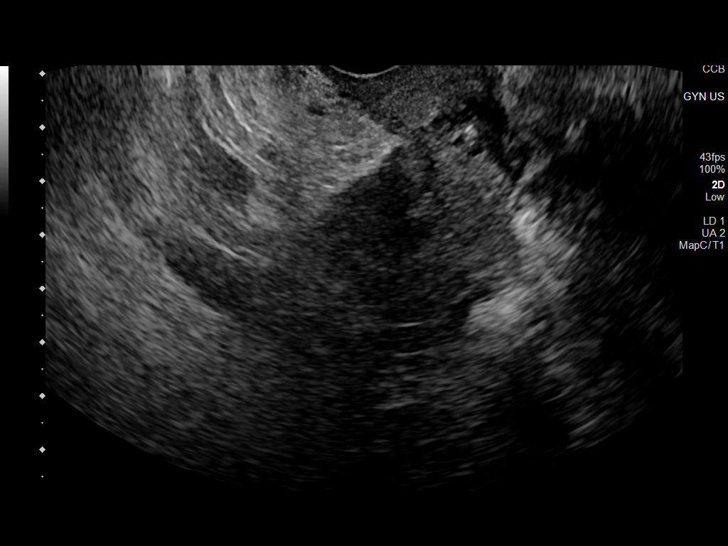
[im 38/65]
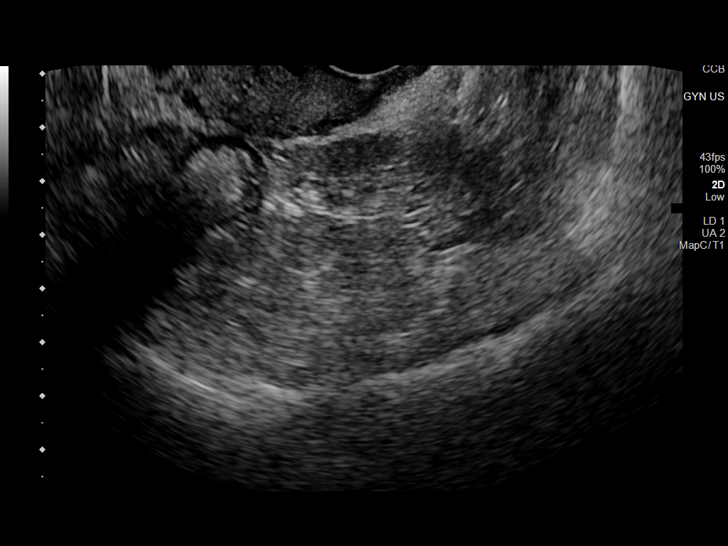
[im 43/65]
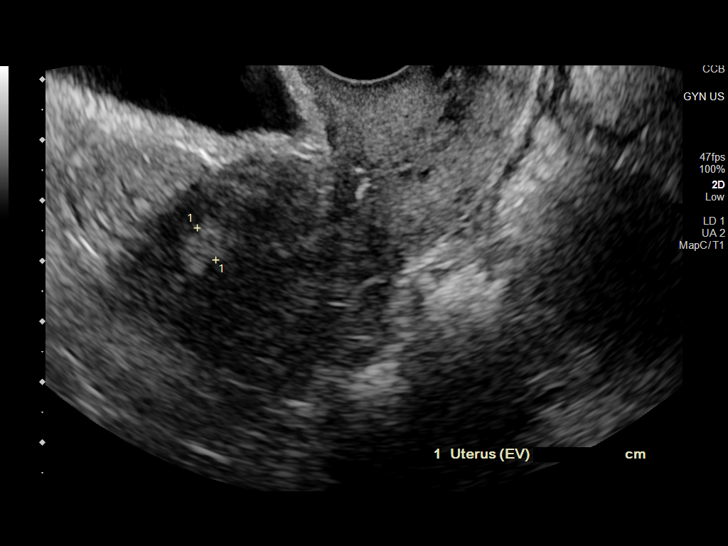
[im 49/65]
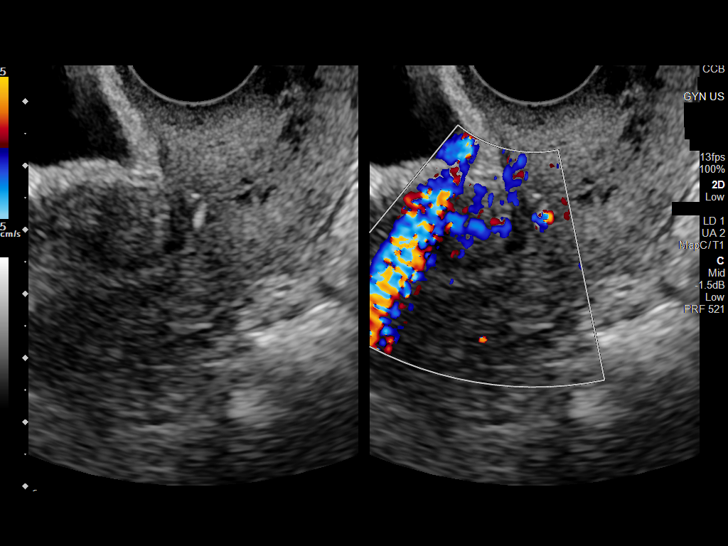
[im 54/65]
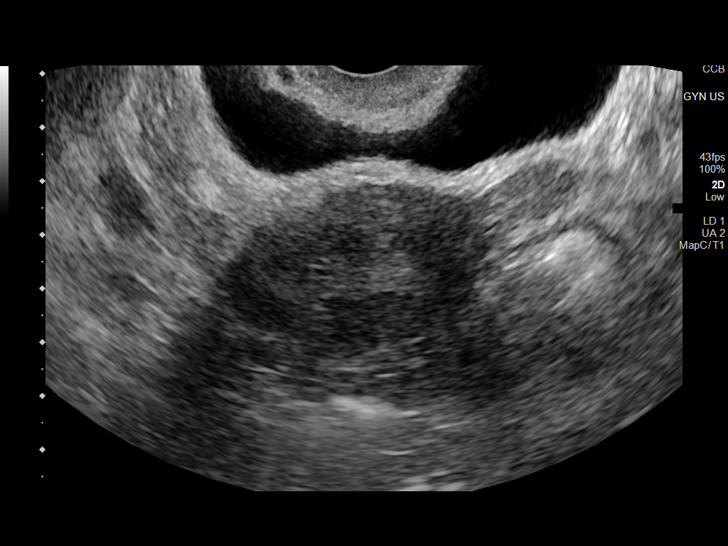
[im 59/65]
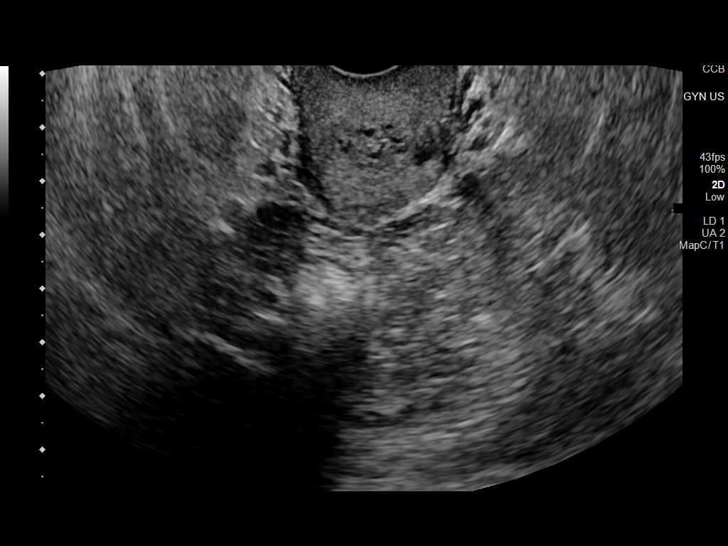
[im 65/65]
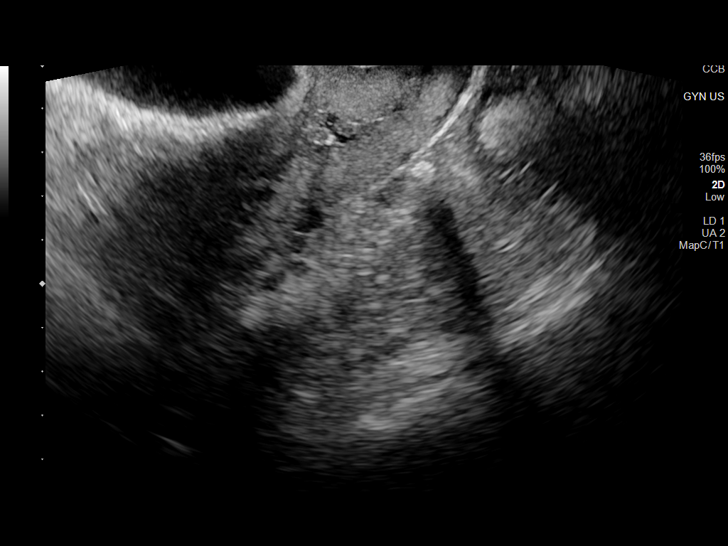

[13 of 25 positions shown; findings below may reference images not displayed]

FINDINGS: Uterus

Measurements: 9.0 x 2.5 x 4.7 cm = volume: 55 mL. Anteverted.
Nabothian cysts at cervix. No additional uterine mass.

Endometrium

Thickness: 6 mm.  No endometrial fluid

Right ovary

Not visualized, likely obscured by bowel

Left ovary

Not visualized, likely obscured by bowel

Other findings

No free pelvic fluid.  No adnexal masses.
IMPRESSION: Nonvisualization of ovaries.

6 mm thick endometrial complex, abnormal for a postmenopausal
patient with bleeding; in the setting of post-menopausal bleeding,
endometrial sampling is indicated to exclude carcinoma. If results
are benign, sonohysterogram should be considered for focal lesion
work-up. (Ref: Radiological Reasoning: Algorithmic Workup of
Abnormal Vaginal Bleeding with Endovaginal Sonography and
Sonohysterography. AJR [id number]; 191:S68-73)

These results will be called to the ordering clinician or
representative by the Radiologist Assistant, and communication
documented in the PACS or [REDACTED].

## 2022-03-21 ENCOUNTER — Encounter (HOSPITAL_COMMUNITY): Payer: Self-pay

## 2022-03-21 ENCOUNTER — Telehealth (HOSPITAL_COMMUNITY): Payer: Self-pay | Admitting: *Deleted

## 2022-03-21 NOTE — Telephone Encounter (Signed)
Reaching out pt change patient's appointment location to [facility] due to [facility] evaluation.  She verbalized understanding of the change. She requested for Mychart instructions as she was not at home.  I encouraged her to give a call with any questions prior to 4pm today. She is aware to arrive at 7:30am tomorrow.  Gordy Clement RN Navigator [facility] [phone] Office [phone] Cell

## 2022-03-22 ENCOUNTER — Ambulatory Visit (HOSPITAL_BASED_OUTPATIENT_CLINIC_OR_DEPARTMENT_OTHER): Admission: RE | Admit: 2022-03-22 | Payer: Medicare Other | Source: Ambulatory Visit

## 2022-03-22 ENCOUNTER — Ambulatory Visit (HOSPITAL_COMMUNITY)
Admission: RE | Admit: 2022-03-22 | Discharge: 2022-03-22 | Disposition: A | Discharge status: Home / Self Care | Payer: Medicare Other | Source: Ambulatory Visit | Attending: Cardiology | Admitting: Cardiology

## 2022-03-22 DIAGNOSIS — I4819 Other persistent atrial fibrillation: Secondary | ICD-10-CM

## 2022-03-22 DIAGNOSIS — I4891 Unspecified atrial fibrillation: Secondary | ICD-10-CM | POA: Diagnosis present

## 2022-03-22 MED ORDER — IOHEXOL 350 MG/ML SOLN
100.0000 mL | Freq: Once | INTRAVENOUS | Status: AC | PRN
  Administered 2022-03-22: 100 mL via INTRAVENOUS

## 2022-03-22 MED ORDER — METOPROLOL TARTRATE 5 MG/5ML IV SOLN
INTRAVENOUS | Status: AC
  Filled 2022-03-22: qty 10

## 2022-03-22 MED ORDER — DILTIAZEM HCL 25 MG/5ML IV SOLN
INTRAVENOUS | Status: DC
Start: 2022-03-22 — End: 2022-03-22
  Filled 2022-03-22: qty 5

## 2022-03-29 NOTE — Pre-Procedure Instructions (Signed)
Instructed patient on the following items: Arrival time 0830 Nothing to eat or drink after midnight No meds AM of procedure Responsible person to drive you home and stay with you for 24 hrs  Have you missed any doses of anti-coagulant Eliquis- hasn't missed any doses   

## 2022-03-29 NOTE — Anesthesia Preprocedure Evaluation (Addendum)
Anesthesia Evaluation  Patient identified by MRN, date of birth, ID band Patient awake    Reviewed: Allergy & Precautions, NPO status , Patient's Chart, lab work & pertinent test results  Airway Mallampati: II  TM Distance: >3 FB Neck ROM: Full    Dental no notable dental hx. (+) Teeth Intact, Dental Advisory Given   Pulmonary sleep apnea ,    Pulmonary exam normal breath sounds clear to auscultation       Cardiovascular hypertension, Normal cardiovascular exam+ dysrhythmias Atrial Fibrillation + Valvular Problems/Murmurs (mild) MR  Rhythm:Irregular Rate:Abnormal  TTE 2023 1. Left ventricular ejection fraction, by estimation, is 60 to 65%. The  left ventricle has normal function. The left ventricle has no regional  wall motion abnormalities. Left ventricular diastolic parameters are  indeterminate.  2. Right ventricular systolic function is normal. The right ventricular  size is normal. There is normal pulmonary artery systolic pressure.  3. Left atrial size was mildly dilated.  4. The mitral valve is normal in structure. Mild mitral valve  regurgitation. No evidence of mitral stenosis.  5. The aortic valve is grossly normal. Aortic valve regurgitation is not  visualized. No aortic stenosis is present.  6. The inferior vena cava is normal in size with greater than 50%  respiratory variability, suggesting right atrial pressure of 3 mmHg.   Stress Test 2023 The study is normal. The study is low risk. .  No ST deviation was noted. .  LV perfusion is normal. .  Left ventricular function is normal. Nuclear stress EF: 69 %. The left ventricular ejection fraction is hyperdynamic (>65%). End diastolic cavity size is normal. .  Prior study not available for comparison.   Neuro/Psych negative neurological ROS     GI/Hepatic Neg liver ROS, GERD  ,  Endo/Other    Renal/GU Lab Results      Component                Value                Date                      CREATININE               0.80                03/08/2022                K                        4.6                 03/08/2022                    Musculoskeletal  (+) Arthritis ,   Abdominal (+) + obese,   Peds  Hematology Lab Results      Component                Value               Date                        HGB                      11.7                03/08/2022  HCT                      35.6                03/08/2022               PLT                      345                 03/08/2022              Anesthesia Other Findings ALL: tramadol, atenolol, avelox, azithromycin, doxycycline, naproxen  Reproductive/Obstetrics                           Anesthesia Physical Anesthesia Plan  ASA: 3  Anesthesia Plan: General   Post-op Pain Management: Minimal or no pain anticipated   Induction:   PONV Risk Score and Plan: 4 or greater and Treatment may vary due to age or medical condition, Ondansetron, Midazolam and Dexamethasone  Airway Management Planned: Oral ETT  Additional Equipment: None  Intra-op Plan:   Post-operative Plan: Extubation in OR  Informed Consent: I have reviewed the patients History and Physical, chart, labs and discussed the procedure including the risks, benefits and alternatives for the proposed anesthesia with the patient or authorized representative who has indicated his/her understanding and acceptance.     Dental advisory given  Plan Discussed with:   Anesthesia Plan Comments:        Anesthesia Quick Evaluation

## 2022-03-30 ENCOUNTER — Ambulatory Visit (HOSPITAL_BASED_OUTPATIENT_CLINIC_OR_DEPARTMENT_OTHER): Payer: Medicare Other | Admitting: Anesthesiology

## 2022-03-30 ENCOUNTER — Other Ambulatory Visit: Payer: Self-pay

## 2022-03-30 ENCOUNTER — Encounter (HOSPITAL_COMMUNITY)
Admission: RE | Disposition: A | Discharge status: Home / Self Care | Payer: Medicare Other | Source: Home / Self Care | Attending: Cardiology

## 2022-03-30 ENCOUNTER — Encounter (HOSPITAL_COMMUNITY): Payer: Self-pay | Admitting: Cardiology

## 2022-03-30 ENCOUNTER — Ambulatory Visit (HOSPITAL_COMMUNITY)
Admission: RE | Admit: 2022-03-30 | Discharge: 2022-03-30 | Disposition: A | Discharge status: Home / Self Care | Payer: Medicare Other | Attending: Cardiology | Admitting: Cardiology

## 2022-03-30 ENCOUNTER — Ambulatory Visit (HOSPITAL_COMMUNITY): Payer: Medicare Other | Admitting: Anesthesiology

## 2022-03-30 ENCOUNTER — Other Ambulatory Visit (HOSPITAL_COMMUNITY): Payer: Self-pay

## 2022-03-30 DIAGNOSIS — I4819 Other persistent atrial fibrillation: Secondary | ICD-10-CM | POA: Insufficient documentation

## 2022-03-30 DIAGNOSIS — G4730 Sleep apnea, unspecified: Secondary | ICD-10-CM | POA: Diagnosis not present

## 2022-03-30 DIAGNOSIS — M1990 Unspecified osteoarthritis, unspecified site: Secondary | ICD-10-CM | POA: Diagnosis not present

## 2022-03-30 DIAGNOSIS — I4891 Unspecified atrial fibrillation: Secondary | ICD-10-CM

## 2022-03-30 DIAGNOSIS — Z7901 Long term (current) use of anticoagulants: Secondary | ICD-10-CM | POA: Diagnosis not present

## 2022-03-30 DIAGNOSIS — G4733 Obstructive sleep apnea (adult) (pediatric): Secondary | ICD-10-CM | POA: Diagnosis not present

## 2022-03-30 DIAGNOSIS — I10 Essential (primary) hypertension: Secondary | ICD-10-CM

## 2022-03-30 DIAGNOSIS — Z6841 Body Mass Index (BMI) 40.0 and over, adult: Secondary | ICD-10-CM | POA: Diagnosis not present

## 2022-03-30 HISTORY — PX: ATRIAL FIBRILLATION ABLATION: EP1191

## 2022-03-30 LAB — POCT ACTIVATED CLOTTING TIME
Activated Clotting Time: 348 seconds
Activated Clotting Time: 365 seconds

## 2022-03-30 SURGERY — ATRIAL FIBRILLATION ABLATION
Anesthesia: General

## 2022-03-30 MED ORDER — COLCHICINE 0.6 MG PO TABS
0.6000 mg | ORAL_TABLET | Freq: Two times a day (BID) | ORAL | Status: DC
  Administered 2022-03-30: 0.6 mg via ORAL
  Filled 2022-03-30: qty 1

## 2022-03-30 MED ORDER — HEPARIN (PORCINE) IN NACL 1000-0.9 UT/500ML-% IV SOLN
INTRAVENOUS | Status: DC | PRN
  Administered 2022-03-30 (×3): 500 mL

## 2022-03-30 MED ORDER — FENTANYL CITRATE (PF) 100 MCG/2ML IJ SOLN
INTRAMUSCULAR | Status: DC | PRN
  Administered 2022-03-30: 75 ug via INTRAVENOUS
  Administered 2022-03-30: 25 ug via INTRAVENOUS

## 2022-03-30 MED ORDER — PROPOFOL 10 MG/ML IV BOLUS
INTRAVENOUS | Status: DC | PRN
  Administered 2022-03-30: 140 mg via INTRAVENOUS

## 2022-03-30 MED ORDER — HYDROMORPHONE HCL 1 MG/ML IJ SOLN: 0.2500 mg | INTRAMUSCULAR | Status: DC | PRN

## 2022-03-30 MED ORDER — SUGAMMADEX SODIUM 200 MG/2ML IV SOLN
INTRAVENOUS | Status: DC | PRN
  Administered 2022-03-30: 200 mg via INTRAVENOUS

## 2022-03-30 MED ORDER — ACETAMINOPHEN 325 MG PO TABS
650.0000 mg | ORAL_TABLET | ORAL | Status: DC | PRN
  Administered 2022-03-30: 650 mg via ORAL
  Filled 2022-03-30: qty 2

## 2022-03-30 MED ORDER — SODIUM CHLORIDE 0.9 % IV SOLN
250.0000 mL | INTRAVENOUS | Status: DC | PRN
Start: 2022-03-30 — End: 2022-03-30

## 2022-03-30 MED ORDER — COLCHICINE 0.6 MG PO TABS
0.6000 mg | ORAL_TABLET | Freq: Two times a day (BID) | ORAL | 0 refills | Status: DC
  Filled 2022-03-30: qty 10, 5d supply, fill #0

## 2022-03-30 MED ORDER — ONDANSETRON HCL 4 MG/2ML IJ SOLN
4.0000 mg | Freq: Once | INTRAMUSCULAR | Status: DC | PRN
Start: 2022-03-30 — End: 2022-03-30

## 2022-03-30 MED ORDER — ONDANSETRON HCL 4 MG/2ML IJ SOLN: 4.0000 mg | Freq: Four times a day (QID) | INTRAMUSCULAR | Status: DC | PRN

## 2022-03-30 MED ORDER — ONDANSETRON HCL 4 MG/2ML IJ SOLN
INTRAMUSCULAR | Status: DC | PRN
  Administered 2022-03-30: 4 mg via INTRAVENOUS

## 2022-03-30 MED ORDER — OXYCODONE HCL 5 MG/5ML PO SOLN
5.0000 mg | Freq: Once | ORAL | Status: DC | PRN
  Filled 2022-03-30: qty 5

## 2022-03-30 MED ORDER — LIDOCAINE 2% (20 MG/ML) 5 ML SYRINGE
INTRAMUSCULAR | Status: DC | PRN
  Administered 2022-03-30: 100 mg via INTRAVENOUS

## 2022-03-30 MED ORDER — HEPARIN SODIUM (PORCINE) 1000 UNIT/ML IJ SOLN
INTRAMUSCULAR | Status: AC
Start: 2022-03-30 — End: ?
  Filled 2022-03-30: qty 10

## 2022-03-30 MED ORDER — PANTOPRAZOLE SODIUM 40 MG PO TBEC
40.0000 mg | DELAYED_RELEASE_TABLET | Freq: Every day | ORAL | 0 refills | Status: DC
  Filled 2022-03-30: qty 45, 45d supply, fill #0

## 2022-03-30 MED ORDER — HEPARIN SODIUM (PORCINE) 1000 UNIT/ML IJ SOLN
INTRAMUSCULAR | Status: DC | PRN
  Administered 2022-03-30: 1000 [IU] via INTRAVENOUS

## 2022-03-30 MED ORDER — OXYCODONE HCL 5 MG PO TABS: 5.0000 mg | ORAL_TABLET | Freq: Once | ORAL | Status: DC | PRN

## 2022-03-30 MED ORDER — SODIUM CHLORIDE 0.9% FLUSH
3.0000 mL | INTRAVENOUS | Status: DC | PRN
Start: 2022-03-30 — End: 2022-03-30

## 2022-03-30 MED ORDER — ROCURONIUM BROMIDE 10 MG/ML (PF) SYRINGE
PREFILLED_SYRINGE | INTRAVENOUS | Status: DC | PRN
  Administered 2022-03-30: 60 mg via INTRAVENOUS

## 2022-03-30 MED ORDER — HEPARIN SODIUM (PORCINE) 1000 UNIT/ML IJ SOLN
INTRAMUSCULAR | Status: DC | PRN
  Administered 2022-03-30: 14000 [IU] via INTRAVENOUS

## 2022-03-30 MED ORDER — PHENYLEPHRINE HCL-NACL 20-0.9 MG/250ML-% IV SOLN
INTRAVENOUS | Status: DC | PRN
  Administered 2022-03-30: 25 ug/min via INTRAVENOUS

## 2022-03-30 MED ORDER — AMISULPRIDE (ANTIEMETIC) 5 MG/2ML IV SOLN: 10.0000 mg | Freq: Once | INTRAVENOUS | Status: DC | PRN

## 2022-03-30 MED ORDER — SODIUM CHLORIDE 0.9 % IV SOLN: INTRAVENOUS | Status: DC

## 2022-03-30 MED ORDER — APIXABAN 5 MG PO TABS
5.0000 mg | ORAL_TABLET | Freq: Two times a day (BID) | ORAL | Status: DC
  Administered 2022-03-30: 5 mg via ORAL
  Filled 2022-03-30 (×2): qty 1

## 2022-03-30 MED ORDER — PHENYLEPHRINE 80 MCG/ML (10ML) SYRINGE FOR IV PUSH (FOR BLOOD PRESSURE SUPPORT)
PREFILLED_SYRINGE | INTRAVENOUS | Status: DC | PRN
  Administered 2022-03-30: 80 ug via INTRAVENOUS

## 2022-03-30 MED ORDER — ACETAMINOPHEN 10 MG/ML IV SOLN: 1000.0000 mg | Freq: Once | INTRAVENOUS | Status: DC | PRN

## 2022-03-30 MED ORDER — PROTAMINE SULFATE 10 MG/ML IV SOLN
INTRAVENOUS | Status: DC | PRN
  Administered 2022-03-30: 30 mg via INTRAVENOUS

## 2022-03-30 MED ORDER — PANTOPRAZOLE SODIUM 40 MG PO TBEC
40.0000 mg | DELAYED_RELEASE_TABLET | Freq: Every day | ORAL | Status: DC
  Administered 2022-03-30: 40 mg via ORAL
  Filled 2022-03-30: qty 1

## 2022-03-30 MED ORDER — DEXAMETHASONE SODIUM PHOSPHATE 10 MG/ML IJ SOLN
INTRAMUSCULAR | Status: DC | PRN
  Administered 2022-03-30: 10 mg via INTRAVENOUS

## 2022-03-30 MED ORDER — SODIUM CHLORIDE 0.9% FLUSH: 3.0000 mL | Freq: Two times a day (BID) | INTRAVENOUS | Status: DC

## 2022-03-30 SURGICAL SUPPLY — 19 items
BAG SNAP BAND KOVER 36X36 (MISCELLANEOUS) IMPLANT
CATH 8FR REPROCESSED SOUNDSTAR (CATHETERS) ×1 IMPLANT
CATH 8FR SOUNDSTAR REPROCESSED (CATHETERS) IMPLANT
CATH ABLAT QDOT MICRO BI TC DF (CATHETERS) IMPLANT
CATH OCTARAY 2.0 F 3-3-3-3-3 (CATHETERS) IMPLANT
CATH S CIRCA THERM PROBE 10F (CATHETERS) IMPLANT
CATH WEB BI DIR CSDF CRV REPRO (CATHETERS) IMPLANT
CLOSURE PERCLOSE PROSTYLE (VASCULAR PRODUCTS) IMPLANT
COVER SWIFTLINK CONNECTOR (BAG) ×1 IMPLANT
PACK EP LATEX FREE (CUSTOM PROCEDURE TRAY) ×1
PACK EP LF (CUSTOM PROCEDURE TRAY) ×1 IMPLANT
PAD DEFIB RADIO PHYSIO CONN (PAD) ×1 IMPLANT
PATCH CARTO3 (PAD) IMPLANT
SHEATH BAYLIS TRANSSEPTAL 98CM (NEEDLE) IMPLANT
SHEATH CARTO VIZIGO SM CVD (SHEATH) IMPLANT
SHEATH PINNACLE 8F 10CM (SHEATH) IMPLANT
SHEATH PINNACLE 9F 10CM (SHEATH) IMPLANT
SHEATH PROBE COVER 6X72 (BAG) IMPLANT
TUBING SMART ABLATE COOLFLOW (TUBING) IMPLANT

## 2022-03-30 NOTE — Discharge Instructions (Signed)

## 2022-03-30 NOTE — H&P (Signed)
Electrophysiology Office Note:     Date:  03/30/2022    ID:  Amy Williamson, DOB [birth year], MRN [id number]   PCP:  Hoyt Koch, MD  [facility] Cardiologist:  None  [facility] Electrophysiologist:  Vickie Epley, MD    Referring MD: Oliver Barre, PA    Chief Complaint: Atrial fibrillation   History of Present Illness:     Amy Williamson is a 71 y.o. female who presents for an evaluation of atrial fibrillation at the request of [name]. Their medical history includes hypertension, GERD, atrial fibrillation.  The patient last all [name] on October 11, 2021.  The patient has had multiple prior cardioversions.  She uses an oral device for obstructive sleep apnea.  At the last appointment with [facility], flecainide was started and the patient was referred to discuss catheter ablation.  She takes Eliquis for stroke prophylaxis.   Based on phone notes, the patient converted normal sinus rhythm after a single dose of flecainide.   The patient presents today for PVI.   Objective      Past Medical History:  Diagnosis Date   Anticoagulant long-term use      eliquis --- managed by cardiology   Arthritis      BACK AND SHOULDERS   Depressive disorder, not elsewhere classified     Essential hypertension      followed by pcp and cardiology   Frequency of urination     GERD (gastroesophageal reflux disease)     History of dysphagia 2015    s/p egd w/ dilation, pt did not have stricture   History of gastritis     OSA (obstructive sleep apnea)      followed by dr t. turner---  study in epic 09-03-2020 moderate osa ,  pt scheduled for cpap titration 12-08-2020   PAF (paroxysmal atrial fibrillation) [facility])      cardiologist-- [name]---- new onset 06-30-2020 at pcp office visit,  s/p DCCV 07-28-2020 successful   Palpitations 06/2020    11-16-2020  per pt denies palpitations since DCCV 07-28-2020   Plantar fasciitis 08/2017    left foot    Pure  hypercholesterolemia             Past Surgical History:  Procedure Laterality Date   BREAST LUMPECTOMY WITH RADIOACTIVE SEED LOCALIZATION Left 06/09/2020    Procedure: LEFT BREAST LUMPECTOMY WITH RADIOACTIVE SEED LOCALIZATION;  Surgeon: Coralie Keens, MD;  Location: [place];  Service: General;  Laterality: Left;  LMA   CARDIOVERSION N/A 07/28/2020    Procedure: CARDIOVERSION;  Surgeon: Werner Lean, MD;  Location: [place] [facility];  Service: Cardiovascular;  Laterality: N/A;   CARPAL TUNNEL RELEASE Bilateral right 2004;  left 2006   CATARACT EXTRACTION W/ INTRAOCULAR LENS  IMPLANT, BILATERAL   2016   CESAREAN SECTION   [id number]    BILATERAL TUBAL LIGATION W/ LAST C/S   COLONOSCOPY WITH PROPOFOL   last one 08/ 2013   [place] N/A 11/22/2020    Procedure: [place];  Surgeon: Megan Salon, MD;  Location: [place];  Service: Gynecology;  Laterality: N/A;   EAR CYST EXCISION   08/03/2011    patient states this is incorrect   ESOPHAGOGASTRODUODENOSCOPY (EGD) WITH ESOPHAGEAL DILATION   08/2013   KNEE ARTHROSCOPY W/ MENISCAL REPAIR Left 08-03-2011  '@[facility]'$     and excision baker's cyst   PULLEY RELEASE RIGHT TRIGGER FINGER Right  01/08/2011   TRIGGER FINGER RELEASE Bilateral left 05/06/14;  right  02-29-2014      Current Medications: Active Medications      Current Meds  Medication Sig   acetaminophen (TYLENOL) 650 MG CR tablet Take 650 mg by mouth in the morning and at bedtime.   apixaban (ELIQUIS) 5 MG TABS tablet Take 1 tablet (5 mg total) by mouth 2 (two) times daily.   chlorpheniramine (CHLOR-TRIMETON) 4 MG tablet Take 4 mg by mouth 2 (two) times daily as needed for allergies.   diltiazem (CARDIZEM CD) 240 MG 24 hr capsule TAKE 1 CAPSULE BY MOUTH EVERY DAY   famotidine (PEPCID) 40 MG tablet Take 40 mg by mouth at bedtime.   flecainide (TAMBOCOR) 50 MG tablet Take  0.5 tablets (25 mg total) by mouth 2 (two) times daily as needed (breakthrough afib).   fluticasone (FLONASE) 50 MCG/ACT nasal spray Place 2 sprays into both nostrils daily as needed for allergies or rhinitis.   omeprazole (PRILOSEC) 20 MG capsule TAKE 1 CAPSULE BY MOUTH EVERY DAY   rosuvastatin (CRESTOR) 20 MG tablet Take 1 tablet (20 mg total) by mouth daily.   traZODone (DESYREL) 50 MG tablet TAKE 0.5-1 TABLETS BY MOUTH AT BEDTIME AS NEEDED FOR SLEEP.        Allergies:   Atenolol, Avelox [moxifloxacin hcl in nacl], Azithromycin, Doxycycline, Myrbetriq [mirabegron], and Oxybutynin    Social History         Socioeconomic History   Marital status: Married      Spouse name: david   Number of children: 2   Years of education: Not on file   Highest education level: Not on file  Occupational History   Occupation: retired  Tobacco Use   Smoking status: Never   Smokeless tobacco: Never   Tobacco comments:      Never smoke 10/11/21  Vaping Use   Vaping Use: Never used  Substance and Sexual Activity   Alcohol use: No   Drug use: Never   Sexual activity: Not on file      Comment: BTL  Other Topics Concern   Not on file  Social History Narrative   Not on file    Social Determinants of Health       Financial Resource Strain: Low Risk    Difficulty of Paying Living Expenses: Not hard at all  Food Insecurity: No Food Insecurity   Worried About Charity fundraiser in the Last Year: Never true   [place] in the Last Year: Never true  Transportation Needs: No Transportation Needs   Lack of Transportation (Medical): No   Lack of Transportation (Non-Medical): No  Physical Activity: Inactive   Days of Exercise per Week: 0 days   Minutes of Exercise per Session: 0 min  Stress: No Stress Concern Present   Feeling of Stress : Not at all  Social Connections: Moderately Integrated   Frequency of Communication with Friends and Family: Twice a week   Frequency of Social  Gatherings with Friends and Family: Twice a week   Attends Religious Services: More than 4 times per year   Active Member of [employer] or Organizations: No   Attends Music therapist: Never   Marital Status: Married      Family History: The patient's family history includes Clotting disorder in her father; Heart disease in her father; Heart failure in her mother; Hyperlipidemia in her mother; Hypertension in her mother; Liver cancer in her brother; Liver disease  in her maternal aunt; Osteoporosis in her mother. There is no history of Colon cancer.   ROS:   Please see the history of present illness.    All other systems reviewed and are negative.   EKGs/Labs/Other Studies Reviewed:     The following studies were reviewed today:   August 10, 2020 echo Normal left ventricular function, 60% Right ventricular function normal Trivial MR     EKG:  The ekg ordered today demonstrates sinus rhythm     Recent Labs: 07/03/2021: ALT 12 09/26/2021: B Natriuretic Peptide 200.5 10/06/2021: BUN 20; Creatinine, Ser 0.89; Hemoglobin 14.0; Magnesium 2.5; Platelets 269; Potassium 4.0; Sodium 139 10/11/2021: TSH 1.220  Recent Lipid Panel Labs (Brief)          Component Value Date/Time    CHOL 155 05/09/2021 0732    TRIG 118 05/09/2021 0732    TRIG 54 06/25/2006 0725    HDL 62 05/09/2021 0732    CHOLHDL 2.5 05/09/2021 0732    CHOLHDL 3 06/30/2020 0903    VLDL 23.2 06/30/2020 0903    LDLCALC 72 05/09/2021 0732    LDLDIRECT 142.3 03/16/2013 0732        Physical Exam:     VS:  BP 159/62   Pulse 78   Ht 5' (1.524 m)   Wt 215 lb 9.6 oz (97.8 kg)   LMP 07/23/2004   SpO2 97%   BMI 42.11 kg/m         Wt Readings from Last 3 Encounters:  12/20/21 215 lb 9.6 oz (97.8 kg)  10/19/21 209 lb (94.8 kg)  10/11/21 209 lb 9.6 oz (95.1 kg)      GEN:  Well nourished, well developed in no acute distress HEENT: Normal NECK: No JVD; No carotid bruits LYMPHATICS: No  lymphadenopathy CARDIAC: RRR, no murmurs, rubs, gallops RESPIRATORY:  Clear to auscultation without rales, wheezing or rhonchi  ABDOMEN: Soft, non-tender, non-distended MUSCULOSKELETAL:  No edema; No deformity  SKIN: Warm and dry NEUROLOGIC:  Alert and oriented x 3 PSYCHIATRIC:  Normal affect          Assessment ASSESSMENT:     1. Persistent atrial fibrillation ([place])   2. OSA (obstructive sleep apnea)   3. Essential hypertension   4. Morbid obesity ([place])     PLAN:     In order of problems listed above:   #Persistent atrial fibrillation Symptomatic.  Required antiarrhythmic drugs for pharmacologic cardioversion in the past.  She does not tolerate flecainide twice daily dosing due to the side effects.  She is on Eliquis for stroke prophylaxis.   Discussed treatment options today for her AF including antiarrhythmic drug therapy and ablation. Discussed risks, recovery and likelihood of success. Discussed potential need for repeat ablation procedures and antiarrhythmic drugs after an initial ablation. They wish to proceed with scheduling.   Risk, benefits, and alternatives to EP study and radiofrequency ablation for afib were also discussed in detail today. These risks include but are not limited to stroke, bleeding, vascular damage, tamponade, perforation, damage to the esophagus, lungs, and other structures, pulmonary vein stenosis, worsening renal function, and death. The patient understands these risk and wishes to proceed.  We will therefore proceed with catheter ablation at the next available time.  Carto, ICE, anesthesia are requested for the procedure.  Will also obtain CT PV protocol prior to the procedure to exclude LAA thrombus and further evaluate atrial anatomy.   Presents for PVI. Procedure reviewed.      Signed, Hilton Cork.  Quentin Ore, MD, [facility], [facility] 03/30/2022 Electrophysiology [facility]

## 2022-03-30 NOTE — Anesthesia Procedure Notes (Addendum)
Procedure Name: Intubation Date/Time: 03/30/2022 11:04 AM  Performed by: Colin Benton, CRNAPre-anesthesia Checklist: Patient identified, Emergency Drugs available, Suction available and Patient being monitored Patient Re-evaluated:Patient Re-evaluated prior to induction Oxygen Delivery Method: Circle system utilized Preoxygenation: Pre-oxygenation with 100% oxygen Induction Type: IV induction Ventilation: Mask ventilation without difficulty and Oral airway inserted - appropriate to patient size Laryngoscope Size: Sabra Heck and 2 Grade View: Grade II Tube type: Oral Tube size: 7.0 mm Number of attempts: 1 Airway Equipment and Method: Stylet and Oral airway Placement Confirmation: ETT inserted through vocal cords under direct vision, positive ETCO2 and breath sounds checked- equal and bilateral Secured at: 21 cm Tube secured with: Tape Dental Injury: Teeth and Oropharynx as per pre-operative assessment

## 2022-03-30 NOTE — Transfer of Care (Signed)
Immediate Anesthesia Transfer of Care Note  Patient: Amy Williamson  Procedure(s) Performed: ATRIAL FIBRILLATION ABLATION  Patient Location: Cath Lab  Anesthesia Type:General  Level of Consciousness: awake, alert  and oriented  Airway & Oxygen Therapy: Patient Spontanous Breathing and Patient connected to nasal cannula oxygen  Post-op Assessment: Report given to RN, Post -op Vital signs reviewed and stable and Patient moving all extremities X 4  Post vital signs: Reviewed and stable  Last Vitals:  Vitals Value Taken Time  BP 139/58 03/30/22 1316  Temp 36.6 C 03/30/22 1312  Pulse 78 03/30/22 1316  Resp 18 03/30/22 1316  SpO2 99 % 03/30/22 1316  Vitals shown include unvalidated device data.  Last Pain:  Vitals:   03/30/22 1312  TempSrc: Tympanic  PainSc: 0-No pain         Complications: There were no known notable events for this encounter.

## 2022-03-30 NOTE — Progress Notes (Signed)
Patient and husband was given discharge instructions. Both verbalized understanding. 

## 2022-04-02 ENCOUNTER — Other Ambulatory Visit (HOSPITAL_COMMUNITY): Payer: Self-pay

## 2022-04-02 ENCOUNTER — Encounter (HOSPITAL_COMMUNITY): Payer: Self-pay | Admitting: Cardiology

## 2022-04-02 NOTE — Anesthesia Postprocedure Evaluation (Signed)
Anesthesia Post Note  Patient: Amy Williamson  Procedure(s) Performed: ATRIAL FIBRILLATION ABLATION     Patient location during evaluation: PACU Anesthesia Type: General Level of consciousness: awake and alert Pain management: pain level controlled Vital Signs Assessment: post-procedure vital signs reviewed and stable Respiratory status: spontaneous breathing, nonlabored ventilation, respiratory function stable and patient connected to nasal cannula oxygen Cardiovascular status: blood pressure returned to baseline and stable Postop Assessment: no apparent nausea or vomiting Anesthetic complications: no   There were no known notable events for this encounter.  Last Vitals:  Vitals:   03/30/22 1500 03/30/22 1600  BP: (!) 146/75 (!) 145/66  Pulse: 79 79  Resp: 17 18  Temp:    SpO2: 97% 94%    Last Pain:  Vitals:   04/02/22 1544  TempSrc:   PainSc: 0-No pain   Pain Goal:                   Barnet Glasgow

## 2022-04-10 ENCOUNTER — Other Ambulatory Visit: Payer: Self-pay

## 2022-04-10 ENCOUNTER — Telehealth: Payer: Self-pay

## 2022-04-10 DIAGNOSIS — I4819 Other persistent atrial fibrillation: Secondary | ICD-10-CM

## 2022-04-10 NOTE — Telephone Encounter (Signed)
Reviewed results with patient who verbalized understanding.   The patient wishes to proceed with LAAO 06/28/2022.  She will keep her appointment with [name] 10/5. Scheduled her for pre-LAAO visit 11/27. She was grateful for call and agrees with plan.

## 2022-04-10 NOTE — Telephone Encounter (Signed)
-----   Message from Vickie Epley, MD sent at 03/28/2022  1:14 PM EDT ----- OK to proceed with watchman evaluation.  Lysbeth Galas T. Quentin Ore, MD, [facility], [facility] Cardiac Electrophysiology

## 2022-04-26 ENCOUNTER — Encounter (HOSPITAL_COMMUNITY): Payer: Self-pay | Admitting: Physician Assistant

## 2022-04-26 ENCOUNTER — Ambulatory Visit (HOSPITAL_COMMUNITY)
Admission: RE | Admit: 2022-04-26 | Discharge: 2022-04-26 | Disposition: A | Discharge status: Home / Self Care | Payer: Medicare Other | Source: Ambulatory Visit | Attending: Physician Assistant | Admitting: Physician Assistant

## 2022-04-26 VITALS — BP 150/70 | HR 72 | Wt 198.8 lb

## 2022-04-26 DIAGNOSIS — Z7901 Long term (current) use of anticoagulants: Secondary | ICD-10-CM | POA: Insufficient documentation

## 2022-04-26 DIAGNOSIS — Z79899 Other long term (current) drug therapy: Secondary | ICD-10-CM | POA: Diagnosis not present

## 2022-04-26 DIAGNOSIS — E669 Obesity, unspecified: Secondary | ICD-10-CM | POA: Diagnosis not present

## 2022-04-26 DIAGNOSIS — D6869 Other thrombophilia: Secondary | ICD-10-CM | POA: Insufficient documentation

## 2022-04-26 DIAGNOSIS — G4733 Obstructive sleep apnea (adult) (pediatric): Secondary | ICD-10-CM | POA: Insufficient documentation

## 2022-04-26 DIAGNOSIS — E785 Hyperlipidemia, unspecified: Secondary | ICD-10-CM | POA: Diagnosis not present

## 2022-04-26 DIAGNOSIS — Z6838 Body mass index (BMI) 38.0-38.9, adult: Secondary | ICD-10-CM | POA: Insufficient documentation

## 2022-04-26 DIAGNOSIS — I10 Essential (primary) hypertension: Secondary | ICD-10-CM | POA: Diagnosis not present

## 2022-04-26 DIAGNOSIS — Z8249 Family history of ischemic heart disease and other diseases of the circulatory system: Secondary | ICD-10-CM | POA: Insufficient documentation

## 2022-04-26 DIAGNOSIS — I4819 Other persistent atrial fibrillation: Secondary | ICD-10-CM | POA: Diagnosis present

## 2022-04-26 NOTE — Progress Notes (Signed)
Primary Care Physician: Hoyt Koch, MD Primary Cardiologist: Dr Gasper Sells Primary Electrophysiologist: Dr Quentin Ore Referring Physician: Coletta Memos NP   Amy Williamson is a 71 y.o. female with a history of HTN, aortic atherosclerosis, OSA, HLD, atrial fibrillation who presents for follow up in the [facility].  The patient was initially diagnosed with atrial fibrillation 06/2020 and underwent DCCV on 07/28/20. She presented to the ED 09/26/21 with symptoms palpitations and underwent DCCV but unfortunately returned to the ED on 10/06/21 and had another DCCV. Patient is on Eliquis for a CHADS2VASC score of 4. She has symptoms of fatigue while in afib and for several days afterwards. She did have a steroid injection for bursitis a week before her first ED visit. She was started on flecainide but had intolerable side effects. She did have a breakthrough episode of afib 12/12/21 and took a PRN dose of flecainide which converted her to SR. She was seen by Dr Quentin Ore on 12/20/21 and patient is scheduled for ablation 03/29/22. She is also undergoing workup for Watchman.  On follow up today, s/p afib ablation 03/30/22 with Dr Quentin Ore. She has done well since the procedure with no interim afib. She denies chest pain, swallowing pain, or groin issues. She is scheduled for Watchman implant 06/28/22.  Today, she denies symptoms of palpitations, chest pain, shortness of breath, orthopnea, PND, lower extremity edema, dizziness, presyncope, syncope, bleeding, or neurologic sequela. The patient is tolerating medications without difficulties and is otherwise without complaint today.    Atrial Fibrillation Risk Factors:  she does have symptoms or diagnosis of sleep apnea. she is compliant with oral device.  she does not have a history of rheumatic fever. she does not have a history of alcohol use. The patient does not have a history of early familial atrial fibrillation or  other arrhythmias.  she has a BMI of Body mass index is 38.83 kg/m.[name] Filed Weights   04/26/22 1119  Weight: 90.2 kg    Family History  Problem Relation Age of Onset   Heart failure Mother    Osteoporosis Mother    Hyperlipidemia Mother    Hypertension Mother    Heart disease Father    Clotting disorder Father    Liver cancer Brother    Liver disease Maternal Aunt        x2   Colon cancer Neg Hx      Atrial Fibrillation Management history:  Previous antiarrhythmic drugs: flecainide  Previous cardioversions: 07/28/20, 09/26/21, 10/06/21 Previous ablations: 03/30/22 CHADS2VASC score: 4 Anticoagulation history: Eliquis   Past Medical History:  Diagnosis Date   Anticoagulant long-term use    eliquis --- managed by cardiology   Arthritis    BACK AND SHOULDERS   Depressive disorder, not elsewhere classified    Essential hypertension    followed by pcp and cardiology   Frequency of urination    GERD (gastroesophageal reflux disease)    History of dysphagia 2015   s/p egd w/ dilation, pt did not have stricture   History of gastritis    OSA (obstructive sleep apnea)    followed by dr t. turner---  study in epic 09-03-2020 moderate osa ,  pt scheduled for cpap titration 12-08-2020   PAF (paroxysmal atrial fibrillation) [facility])    cardiologist-- dr Osborne Oman---- new onset 06-30-2020 at pcp office visit,  s/p DCCV 07-28-2020 successful   Palpitations 06/2020   11-16-2020  per pt denies palpitations since DCCV 07-28-2020   Plantar fasciitis  08/2017   left foot    Pure hypercholesterolemia    Past Surgical History:  Procedure Laterality Date   ATRIAL FIBRILLATION ABLATION N/A 03/30/2022   Procedure: ATRIAL FIBRILLATION ABLATION;  Surgeon: Vickie Epley, MD;  Location: [facility];  Service: Cardiovascular;  Laterality: N/A;   BREAST LUMPECTOMY WITH RADIOACTIVE SEED LOCALIZATION Left 06/09/2020   Procedure: LEFT BREAST LUMPECTOMY WITH RADIOACTIVE SEED  LOCALIZATION;  Surgeon: Coralie Keens, MD;  Location: [facility];  Service: General;  Laterality: Left;  LMA   CARDIOVERSION N/A 07/28/2020   Procedure: CARDIOVERSION;  Surgeon: Werner Lean, MD;  Location: [facility];  Service: Cardiovascular;  Laterality: N/A;   CARPAL TUNNEL RELEASE Bilateral right 2004;  left 2006   CATARACT EXTRACTION W/ INTRAOCULAR LENS  IMPLANT, BILATERAL  2016   CESAREAN SECTION  [id number]   BILATERAL TUBAL LIGATION W/ LAST C/S   COLONOSCOPY WITH PROPOFOL  last one 08/ 2013   DILATATION & CURETTAGE/HYSTEROSCOPY WITH MYOSURE N/A 11/22/2020   Procedure: [place];  Surgeon: Megan Salon, MD;  Location: [facility];  Service: Gynecology;  Laterality: N/A;   EAR CYST EXCISION  08/03/2011   patient states this is incorrect was a Baker's cyst popliteal area   ESOPHAGOGASTRODUODENOSCOPY (EGD) WITH ESOPHAGEAL DILATION  08/2013   KNEE ARTHROSCOPY W/ MENISCAL REPAIR Left 08-03-2011  '@[facility]'$    and excision baker's cyst   PULLEY RELEASE RIGHT TRIGGER FINGER Right 01/08/2011   TRIGGER FINGER RELEASE Bilateral left 05/06/14;  right  02-29-2014    Current Outpatient Medications  Medication Sig Dispense Refill   acetaminophen (TYLENOL) 650 MG CR tablet Take 1,300 mg by mouth in the morning and at bedtime.     diltiazem (CARDIZEM CD) 240 MG 24 hr capsule TAKE 1 CAPSULE BY MOUTH EVERY DAY 90 capsule 3   ELIQUIS 5 MG TABS tablet TAKE 1 TABLET BY MOUTH TWICE A DAY 180 tablet 3   flecainide (TAMBOCOR) 50 MG tablet Take 0.5 tablets (25 mg total) by mouth 2 (two) times daily as needed (breakthrough afib). 60 tablet 3   fluticasone (FLONASE) 50 MCG/ACT nasal spray Place 2 sprays into both nostrils daily as needed for allergies or rhinitis.     pantoprazole (PROTONIX) 40 MG tablet Take 1 tablet (40 mg total) by mouth daily. 45 tablet 0   rosuvastatin (CRESTOR) 20 MG tablet TAKE 1 TABLET BY MOUTH EVERY DAY  90 tablet 3   traZODone (DESYREL) 50 MG tablet Take 50 mg by mouth at bedtime.     No current facility-administered medications for this encounter.    Allergies  Allergen Reactions   Tramadol Itching and Rash    May have been shingles instead of allergy per patient   Atenolol     Decreased blood pressure   Avelox [Moxifloxacin Hcl In Nacl]     Pt did not like side effects   Azithromycin     ?causes muscle weakness, dysphagia   Doxycycline Nausea Only    And heartburn   Myrbetriq [Mirabegron]     Increased blood pressure   Naproxen     Can not take while on Eliquis   Oxybutynin Palpitations    Per pt made her have rapid heart beat    Social History   Socioeconomic History   Marital status: Married    Spouse name: Sonia Side   Number of children: 2   Years of education: Not on file   Highest education level: Not on file  Occupational History   Occupation: retired  Tobacco Use   Smoking status: Never   Smokeless tobacco: Never   Tobacco comments:    Never smoke 10/11/21  Vaping Use   Vaping Use: Never used  Substance and Sexual Activity   Alcohol use: No   Drug use: Never   Sexual activity: Not Currently    Birth control/protection: Surgical, Post-menopausal    Comment: BTL  Other Topics Concern   Not on file  Social History Narrative   Not on file   Social Determinants of Health   Financial Resource Strain: Low Risk  (06/30/2021)   Overall Financial Resource Strain (CARDIA)    Difficulty of Paying Living Expenses: Not hard at all  Food Insecurity: No Food Insecurity (06/30/2021)   Hunger Vital Sign    Worried About Running Out of Food in the Last Year: Never true    [place] in the Last Year: Never true  Transportation Needs: No Transportation Needs (06/30/2021)   PRAPARE - Hydrologist (Medical): No    Lack of Transportation (Non-Medical): No  Physical Activity: Inactive (06/30/2021)   Exercise Vital Sign    Days of  Exercise per Week: 0 days    Minutes of Exercise per Session: 0 min  Stress: No Stress Concern Present (06/30/2021)   [place]    Feeling of Stress : Not at all  Social Connections: Moderately Integrated (06/30/2021)   Social Connection and Isolation Panel [NHANES]    Frequency of Communication with Friends and Family: Twice a week    Frequency of Social Gatherings with Friends and Family: Twice a week    Attends Religious Services: More than 4 times per year    Active Member of [employer] or Organizations: No    Attends Archivist Meetings: Never    Marital Status: Married  Human resources officer Violence: Not At Risk (06/30/2021)   Humiliation, Afraid, Rape, and Kick questionnaire    Fear of Current or Ex-Partner: No    Emotionally Abused: No    Physically Abused: No    Sexually Abused: No     ROS- All systems are reviewed and negative except as per the HPI above.  Physical Exam: Vitals:   04/26/22 1119  BP: (!) 150/70  Pulse: 72  Weight: 90.2 kg    GEN- The patient is a well appearing female, alert and oriented x 3 today.   HEENT-head normocephalic, atraumatic, sclera clear, conjunctiva pink, hearing intact, trachea midline. Lungs- Clear to ausculation bilaterally, normal work of breathing Heart- Regular rate and rhythm, no murmurs, rubs or gallops  GI- soft, NT, ND, + BS Extremities- no clubbing, cyanosis, or edema MS- no significant deformity or atrophy Skin- no rash or lesion Psych- euthymic mood, full affect Neuro- strength and sensation are intact   Wt Readings from Last 3 Encounters:  04/26/22 90.2 kg  03/30/22 90.3 kg  02/19/22 92.1 kg    EKG today demonstrates  SR Vent. rate 72 BPM PR interval 196 ms QRS duration 80 ms QT/QTcB 382/418 ms  Echo 01/10/22 demonstrated   1. Left ventricular ejection fraction, by estimation, is 60 to 65%. The  left ventricle has normal function. The left  ventricle has no regional  wall motion abnormalities. Left ventricular diastolic parameters are  indeterminate.   2. Right ventricular systolic function is normal. The right ventricular  size is normal. There is normal pulmonary artery systolic pressure.  3. Left atrial size was mildly dilated.   4. The mitral valve is normal in structure. Mild mitral valve  regurgitation. No evidence of mitral stenosis.   5. The aortic valve is grossly normal. Aortic valve regurgitation is not  visualized. No aortic stenosis is present.   6. The inferior vena cava is normal in size with greater than 50%  respiratory variability, suggesting right atrial pressure of 3 mmHg.   Comparison(s): No significant change from prior study.   Conclusion(s)/Recommendation(s): Otherwise normal echocardiogram, with minor abnormalities described in the report.   Epic records are reviewed at length today  CHA2DS2-VASc Score = 3  The patient's score is based upon: CHF History: 0 HTN History: 1 Diabetes History: 0 Stroke History: 0 Vascular Disease History: 0 Age Score: 1 Gender Score: 1       ASSESSMENT AND PLAN: 1. Persistent Atrial Fibrillation (ICD10:  I48.19) The patient's CHA2DS2-VASc score is 3, indicating a 3.2% annual risk of stroke.   S/p afib ablation 03/30/22 Patient appears to be maintaining SR. Continue diltiazem 240 mg daily Continue Eliquis 5 mg BID for now. Scheduled for Watchman implant 06/28/22. Continue flecainide 50 mg BID PRN for afib.  2. Secondary Hypercoagulable State (ICD10:  D68.69) The patient is at significant risk for stroke/thromboembolism based upon her CHA2DS2-VASc Score of 3.  Continue Apixaban (Eliquis). Plan for [place] 06/28/22.  3. Obesity Body mass index is 38.83 kg/m. Lifestyle modification was discussed and encouraged including regular physical activity and weight reduction.  4. Obstructive sleep apnea Patient intolerant of CPAP. Encouraged compliance with oral  device.  5. HTN Stable, no changes today.   Follow up with structural heart team for Watchman as scheduled.    [facility] [address] [place], [place] [address] [phone] 04/26/2022 11:31 AM

## 2022-06-18 ENCOUNTER — Ambulatory Visit: Payer: Medicare Other | Attending: Cardiology | Admitting: Cardiology

## 2022-06-18 VITALS — BP 136/74 | HR 65 | Ht 60.0 in | Wt 201.4 lb

## 2022-06-18 DIAGNOSIS — Z9889 Other specified postprocedural states: Secondary | ICD-10-CM

## 2022-06-18 DIAGNOSIS — Z8679 Personal history of other diseases of the circulatory system: Secondary | ICD-10-CM

## 2022-06-18 DIAGNOSIS — I4891 Unspecified atrial fibrillation: Secondary | ICD-10-CM

## 2022-06-18 DIAGNOSIS — Z01810 Encounter for preprocedural cardiovascular examination: Secondary | ICD-10-CM

## 2022-06-18 DIAGNOSIS — I10 Essential (primary) hypertension: Secondary | ICD-10-CM | POA: Diagnosis not present

## 2022-06-18 DIAGNOSIS — I4819 Other persistent atrial fibrillation: Secondary | ICD-10-CM

## 2022-06-18 NOTE — Progress Notes (Signed)
[facility]                                     Cardiology Office Note:    Date:  06/18/2022   ID:  Amy Williamson, DOB [birth year], MRN [id number]  PCP:  Amy Koch, MD  [facility] Cardiologist:  None  [facility] Electrophysiologist:  Amy Epley, MD   Referring MD: Amy Williamson, *   Chief Complaint  Patient presents with   Follow-up    Pre LAAO    History of Present Illness:    Amy Williamson is a 71 y.o. female with a hx of HTN, aortic atherosclerosis, OSA, HLD, and atrial fibrillation who was referred to Dr. Quentin Williamson for [place] placement scheduled for 06/28/22.   Amy Williamson is followed closely in the AF Clinic and was initially dx with AF 06/2020 and underwent DCCV on 07/28/20. She has been anticoagulated with Eliquis. She presented to the ED 09/26/21 with symptoms palpitations and underwent repeat DCCV but unfortunately returned to the ED on 10/06/21 and was back in AF once again. She was trialed on Flecainide but had intolerable Williamson effects. She was seen by Dr Amy Williamson on 12/20/21 and was scheduled for AF ablation 03/29/22 with [facility] implant to follow.   Today she is here with her husband and reports that she has been doing well since her ablation. She denies any breakthrough palpitations, SOB, chest pain, LE edema, dizziness, or syncope.  She is tolerating her Eliquis without issues of bleeding in stool or urine.   Past Medical History:  Diagnosis Date   Anticoagulant long-term use    eliquis --- managed by cardiology   Arthritis    BACK AND SHOULDERS   Depressive disorder, not elsewhere classified    Essential hypertension    followed by pcp and cardiology   Frequency of urination    GERD (gastroesophageal reflux disease)    History of dysphagia 2015   s/p egd w/ dilation, pt did not have stricture   History of gastritis    OSA (obstructive sleep apnea)    followed by dr Amy Williamson---  study in epic 09-03-2020 moderate osa ,   pt scheduled for cpap titration 12-08-2020   PAF (paroxysmal atrial fibrillation) [facility])    cardiologist-- dr Amy Williamson---- new onset 06-30-2020 at pcp office visit,  s/p DCCV 07-28-2020 successful   Palpitations 06/2020   11-16-2020  per pt denies palpitations since DCCV 07-28-2020   Plantar fasciitis 08/2017   left foot    Pure hypercholesterolemia     Past Surgical History:  Procedure Laterality Date   ATRIAL FIBRILLATION ABLATION N/A 03/30/2022   Procedure: ATRIAL FIBRILLATION ABLATION;  Surgeon: Amy Epley, MD;  Location: [place];  Service: Cardiovascular;  Laterality: N/A;   BREAST LUMPECTOMY WITH RADIOACTIVE SEED LOCALIZATION Left 06/09/2020   Procedure: LEFT BREAST LUMPECTOMY WITH RADIOACTIVE SEED LOCALIZATION;  Surgeon: Amy Keens, MD;  Location: [place];  Service: General;  Laterality: Left;  LMA   CARDIOVERSION N/A 07/28/2020   Procedure: CARDIOVERSION;  Surgeon: Amy Lean, MD;  Location: [facility];  Service: Cardiovascular;  Laterality: N/A;   CARPAL TUNNEL RELEASE Bilateral right 2004;  left 2006   CATARACT EXTRACTION W/ INTRAOCULAR LENS  IMPLANT, BILATERAL  2016   CESAREAN SECTION  [id number]   BILATERAL TUBAL LIGATION W/ LAST C/S   COLONOSCOPY WITH  PROPOFOL  last one 08/ 2013   DILATATION & CURETTAGE/HYSTEROSCOPY WITH MYOSURE N/A 11/22/2020   Procedure: DILATATION & CURETTAGE/HYSTEROSCOPY WITH MYOSURE;  Surgeon: Amy Salon, MD;  Location: [facility];  Service: Gynecology;  Laterality: N/A;   EAR CYST EXCISION  08/03/2011   patient states this is incorrect was a Baker's cyst popliteal area   ESOPHAGOGASTRODUODENOSCOPY (EGD) WITH ESOPHAGEAL DILATION  08/2013   KNEE ARTHROSCOPY W/ MENISCAL REPAIR Left 08-03-2011  '@[facility]'$    and excision baker's cyst   PULLEY RELEASE RIGHT TRIGGER FINGER Right 01/08/2011   TRIGGER FINGER RELEASE Bilateral left 05/06/14;  right  02-29-2014    Current  Medications: Current Meds  Medication Sig   acetaminophen (TYLENOL) 650 MG CR tablet Take 1,300 mg by mouth in the morning and at bedtime.   diltiazem (CARDIZEM CD) 240 MG 24 hr capsule TAKE 1 CAPSULE BY MOUTH EVERY DAY   ELIQUIS 5 MG TABS tablet TAKE 1 TABLET BY MOUTH TWICE A DAY   fluticasone (FLONASE) 50 MCG/ACT nasal spray Place 2 sprays into both nostrils daily as needed for allergies or rhinitis.   rosuvastatin (CRESTOR) 20 MG tablet TAKE 1 TABLET BY MOUTH EVERY DAY   traZODone (DESYREL) 50 MG tablet Take 50 mg by mouth at bedtime.     Allergies:   Tramadol, Atenolol, Avelox [moxifloxacin hcl in nacl], Azithromycin, Doxycycline, Myrbetriq [mirabegron], Naproxen, and Oxybutynin   Social History   Socioeconomic History   Marital status: Married    Spouse name: Amy Williamson   Number of children: 2   Years of education: Not on file   Highest education level: Not on file  Occupational History   Occupation: retired  Tobacco Use   Smoking status: Never   Smokeless tobacco: Never   Tobacco comments:    Never smoke 10/11/21  Vaping Use   Vaping Use: Never used  Substance and Sexual Activity   Alcohol use: No   Drug use: Never   Sexual activity: Not Currently    Birth control/protection: Surgical, Post-menopausal    Comment: BTL  Other Topics Concern   Not on file  Social History Narrative   Not on file   Social Determinants of Health   Financial Resource Strain: Low Risk  (06/30/2021)   Overall Financial Resource Strain (CARDIA)    Difficulty of Paying Living Expenses: Not hard at all  Food Insecurity: No Food Insecurity (06/30/2021)   Hunger Vital Sign    Worried About Running Out of Food in the Last Year: Never true    [place] in the Last Year: Never true  Transportation Needs: No Transportation Needs (06/30/2021)   PRAPARE - Hydrologist (Medical): No    Lack of Transportation (Non-Medical): No  Physical Activity: Inactive (06/30/2021)    Exercise Vital Sign    Days of Exercise per Week: 0 days    Minutes of Exercise per Session: 0 min  Stress: No Stress Concern Present (06/30/2021)   [place]    Feeling of Stress : Not at all  Social Connections: Moderately Integrated (06/30/2021)   Social Connection and Isolation Panel [NHANES]    Frequency of Communication with Friends and Family: Twice a week    Frequency of Social Gatherings with Friends and Family: Twice a week    Attends Religious Services: More than 4 times per year    Active Member of [employer] or Organizations: No    Attends [employer]  or Organization Meetings: Never    Marital Status: Married     Family History: The patient's family history includes Clotting disorder in her father; Heart disease in her father; Heart failure in her mother; Hyperlipidemia in her mother; Hypertension in her mother; Liver cancer in her brother; Liver disease in her maternal aunt; Osteoporosis in her mother. There is no history of Colon cancer.  ROS:   Please see the history of present illness.    All other systems reviewed and are negative.  EKGs/Labs/Other Studies Reviewed:    The following studies were reviewed today:  Echo 01/10/22 demonstrated   1. Left ventricular ejection fraction, by estimation, is 60 to 65%. The  left ventricle has normal function. The left ventricle has no regional  wall motion abnormalities. Left ventricular diastolic parameters are  indeterminate.   2. Right ventricular systolic function is normal. The right ventricular  size is normal. There is normal pulmonary artery systolic pressure.   3. Left atrial size was mildly dilated.   4. The mitral valve is normal in structure. Mild mitral valve  regurgitation. No evidence of mitral stenosis.   5. The aortic valve is grossly normal. Aortic valve regurgitation is not  visualized. No aortic stenosis is present.   6. The inferior vena cava is  normal in size with greater than 50%  respiratory variability, suggesting right atrial pressure of 3 mmHg.   Comparison(s): No significant change from prior study.   Conclusion(s)/Recommendation(s): Otherwise normal echocardiogram, with minor abnormalities described in the report.    EKG:  EKG is ordered today.  The ekg ordered today demonstrates NSR with 1st degree AV block, HR 65bpm.   Recent Labs: 09/26/2021: B Natriuretic Peptide 200.5 01/02/2022: Magnesium 2.0; TSH 1.98 02/26/2022: ALT 12 03/08/2022: BUN 16; Creatinine, Ser 0.80; Hemoglobin 11.7; Platelets 345; Potassium 4.6; Sodium 140   Recent Lipid Panel    Component Value Date/Time   CHOL 227 (H) 01/02/2022 0936   CHOL 155 05/09/2021 0732   TRIG 155.0 (H) 01/02/2022 0936   TRIG 54 06/25/2006 0725   HDL 56.70 01/02/2022 0936   HDL 62 05/09/2021 0732   CHOLHDL 4 01/02/2022 0936   VLDL 31.0 01/02/2022 0936   LDLCALC 139 (H) 01/02/2022 0936   LDLCALC 72 05/09/2021 0732   LDLDIRECT 142.3 03/16/2013 0732   Risk Assessment/Calculations:    CHA2DS2-VASc Score = 3  The patient's score is based upon: CHF History: 0 HTN History: 1 Diabetes History: 0 Stroke History: 0 Vascular Disease History: 0 Age Score: 1 Gender Score: 1  HAS-BLED score 1 Hypertension (Uncontrolled in 30 days)  No  Abnormal renal and liver function (Dialysis, transplant, Cr >2.26 mg/dL /Cirrhosis or Bilirubin >2x Normal or AST/ALT/AP >3x Normal) No  Stroke No  Bleeding No  Labile INR (Unstable/high INR) No  Elderly (>65) Yes  Drugs or alcohol (? 8 drinks/week, anti-plt or NSAID) No    Physical Exam:    VS:  BP 136/74   Pulse 65   Ht 5' (1.524 m)   Wt 201 lb 6.4 oz (91.4 kg)   LMP 07/23/2004   SpO2 97%   BMI 39.33 kg/m     Wt Readings from Last 3 Encounters:  06/18/22 201 lb 6.4 oz (91.4 kg)  04/26/22 198 lb 12.8 oz (90.2 kg)  03/30/22 199 lb (90.3 kg)    General: Well developed, well nourished, NAD Lungs:Clear to ausculation  bilaterally. No wheezes, rales, or rhonchi. Breathing is unlabored. Cardiovascular: RRR with S1 S2. No murmurs  Extremities: No edema. Neuro: Alert and oriented. No focal deficits. No facial asymmetry. Williamson spontaneously. Psych: Responds to questions appropriately with normal affect.    ASSESSMENT/PLAN:    Persistent atrial fibrillation: now s/p ablation 03/30/22 and maintaining NSR. Scheduled for Watchman 06/28/22. Continue Eliquis '5mg'$  BID and Flecainde '50mg'$  BID PRN for breakthrough AF. Pre intruction letter reviewed along with CHG wash instructions. Obtain CBC, BMET today.   HTN: Stable with no changes needed today   Medication Adjustments/Labs and Tests Ordered: Current medicines are reviewed at length with the patient today.  Concerns regarding medicines are outlined above.  Orders Placed This Encounter  Procedures   Basic metabolic panel   CBC   EKG 12-Lead   No orders of the defined types were placed in this encounter.   Patient Instructions  Medication Instructions:  Your physician recommends that you continue on your current medications as directed. Please refer to the Current Medication list given to you today.  *If you need a refill on your cardiac medications before your next appointment, please call your pharmacy*   Lab Work: BMET, CBC Today If you have labs (blood work) drawn today and your tests are completely normal, you will receive your results only by: [place] (if you have MyChart) OR A paper copy in the mail If you have any lab test that is abnormal or we need to change your treatment, we will call you to review the results.   Testing/Procedures: Watchman procedure   Follow-Up: At [facility], you and your health needs are our priority.  As part of our continuing mission to provide you with exceptional heart care, we have created designated Provider Care Teams.  These Care Teams include your primary Cardiologist (physician) and Advanced  Practice Providers (APPs -  Physician Assistants and Nurse Practitioners) who all work together to provide you with the care you need, when you need it.  Your next appointment:   Structural Team Will Follow-up  The format for your next appointment:   In Person  Provider:   Kathyrn Drown, NP      Important Information About Sugar         Signed, Kathyrn Drown, NP  06/18/2022 11:28 AM    [place]

## 2022-06-18 NOTE — Patient Instructions (Signed)
Medication Instructions:  Your physician recommends that you continue on your current medications as directed. Please refer to the Current Medication list given to you today.  *If you need a refill on your cardiac medications before your next appointment, please call your pharmacy*   Lab Work: BMET, CBC Today If you have labs (blood work) drawn today and your tests are completely normal, you will receive your results only by: [place] (if you have MyChart) OR A paper copy in the mail If you have any lab test that is abnormal or we need to change your treatment, we will call you to review the results.   Testing/Procedures: Watchman procedure   Follow-Up: At [facility], you and your health needs are our priority.  As part of our continuing mission to provide you with exceptional heart care, we have created designated Provider Care Teams.  These Care Teams include your primary Cardiologist (physician) and Advanced Practice Providers (APPs -  Physician Assistants and Nurse Practitioners) who all work together to provide you with the care you need, when you need it.  Your next appointment:   Structural Team Will Follow-up  The format for your next appointment:   In Person  Provider:   Kathyrn Drown, NP      Important Information About Sugar

## 2022-06-19 LAB — BASIC METABOLIC PANEL
BUN/Creatinine Ratio: 20 (ref 12–28)
BUN: 15 mg/dL (ref 8–27)
CO2: 25 mmol/L (ref 20–29)
Calcium: 9.6 mg/dL (ref 8.7–10.3)
Chloride: 104 mmol/L (ref 96–106)
Creatinine, Ser: 0.76 mg/dL (ref 0.57–1.00)
Glucose: 100 mg/dL — ABNORMAL HIGH (ref 70–99)
Potassium: 4.3 mmol/L (ref 3.5–5.2)
Sodium: 143 mmol/L (ref 134–144)
eGFR: 83 mL/min/{1.73_m2} (ref >59)

## 2022-06-19 LAB — CBC
Hematocrit: 37.7 % (ref 34.0–46.6)
Hemoglobin: 12.2 g/dL (ref 11.1–15.9)
MCH: 28 pg (ref 26.6–33.0)
MCHC: 32.4 g/dL (ref 31.5–35.7)
MCV: 87 fL (ref 79–97)
Platelets: 230 10*3/uL (ref 150–450)
RBC: 4.35 x10E6/uL (ref 3.77–5.28)
RDW: 13.8 % (ref 11.7–15.4)
WBC: 7.9 10*3/uL (ref 3.4–10.8)

## 2022-06-21 ENCOUNTER — Telehealth: Payer: Medicare Other

## 2022-06-26 ENCOUNTER — Telehealth: Payer: Self-pay

## 2022-06-26 NOTE — Telephone Encounter (Signed)
Confirmed procedure date of 06/28/2022. Confirmed arrival time of 0700 for procedure time at 0930. Reviewed pre-procedure instructions with patient. The patient understands to call if questions/concerns arise prior to procedure.

## 2022-06-27 ENCOUNTER — Encounter (HOSPITAL_COMMUNITY): Payer: Self-pay | Admitting: Cardiology

## 2022-06-27 NOTE — Progress Notes (Signed)
PCP - Dr Pricilla Holm Cardiologist - N/A Electrophysiologist - Dr Lars Mage  Chest x-ray - DOS EKG - 04/26/22 Stress Test - 10/19/21 ECHO TEE- DOS ECHO - 01/12/22 Cardiac Cath - n/a  ICD Pacemaker/Loop - n/a  Sleep Study -  Yes CPAP - Obtain on DOS  Blood Thinner Instructions:  Patient to follow surgeon's instructions on when to stop Eliquis prior to surgery.  ERAS: NPO

## 2022-06-28 ENCOUNTER — Encounter (HOSPITAL_COMMUNITY)
Admission: RE | Disposition: A | Discharge status: Home / Self Care | Payer: Self-pay | Source: Ambulatory Visit | Attending: Cardiology

## 2022-06-28 ENCOUNTER — Other Ambulatory Visit: Payer: Self-pay

## 2022-06-28 ENCOUNTER — Inpatient Hospital Stay (HOSPITAL_COMMUNITY): Payer: Medicare Other

## 2022-06-28 ENCOUNTER — Inpatient Hospital Stay (HOSPITAL_COMMUNITY)
Admission: RE | Admit: 2022-06-28 | Discharge: 2022-06-28 | DRG: 274 | Disposition: A | Discharge status: Home / Self Care | Payer: Medicare Other | Source: Ambulatory Visit | Attending: Cardiology | Admitting: Cardiology

## 2022-06-28 ENCOUNTER — Encounter (HOSPITAL_COMMUNITY): Payer: Self-pay | Admitting: Cardiology

## 2022-06-28 ENCOUNTER — Other Ambulatory Visit: Payer: Self-pay | Admitting: Cardiology

## 2022-06-28 ENCOUNTER — Inpatient Hospital Stay (HOSPITAL_COMMUNITY): Payer: Medicare Other | Admitting: Anesthesiology

## 2022-06-28 DIAGNOSIS — Z95818 Presence of other cardiac implants and grafts: Principal | ICD-10-CM

## 2022-06-28 DIAGNOSIS — Z006 Encounter for examination for normal comparison and control in clinical research program: Secondary | ICD-10-CM

## 2022-06-28 DIAGNOSIS — I4819 Other persistent atrial fibrillation: Principal | ICD-10-CM | POA: Diagnosis present

## 2022-06-28 DIAGNOSIS — E7800 Pure hypercholesterolemia, unspecified: Secondary | ICD-10-CM | POA: Diagnosis present

## 2022-06-28 DIAGNOSIS — I10 Essential (primary) hypertension: Secondary | ICD-10-CM | POA: Diagnosis present

## 2022-06-28 DIAGNOSIS — G4733 Obstructive sleep apnea (adult) (pediatric): Secondary | ICD-10-CM | POA: Diagnosis present

## 2022-06-28 DIAGNOSIS — Z7901 Long term (current) use of anticoagulants: Secondary | ICD-10-CM | POA: Diagnosis not present

## 2022-06-28 DIAGNOSIS — F32A Depression, unspecified: Secondary | ICD-10-CM | POA: Diagnosis present

## 2022-06-28 DIAGNOSIS — Z832 Family history of diseases of the blood and blood-forming organs and certain disorders involving the immune mechanism: Secondary | ICD-10-CM | POA: Diagnosis not present

## 2022-06-28 DIAGNOSIS — I4891 Unspecified atrial fibrillation: Secondary | ICD-10-CM

## 2022-06-28 DIAGNOSIS — Z79899 Other long term (current) drug therapy: Secondary | ICD-10-CM

## 2022-06-28 DIAGNOSIS — Z6841 Body Mass Index (BMI) 40.0 and over, adult: Secondary | ICD-10-CM | POA: Diagnosis not present

## 2022-06-28 DIAGNOSIS — Z8249 Family history of ischemic heart disease and other diseases of the circulatory system: Secondary | ICD-10-CM

## 2022-06-28 DIAGNOSIS — M1990 Unspecified osteoarthritis, unspecified site: Secondary | ICD-10-CM | POA: Diagnosis not present

## 2022-06-28 DIAGNOSIS — G4730 Sleep apnea, unspecified: Secondary | ICD-10-CM

## 2022-06-28 DIAGNOSIS — I700 Atherosclerosis of aorta: Secondary | ICD-10-CM | POA: Diagnosis present

## 2022-06-28 DIAGNOSIS — K219 Gastro-esophageal reflux disease without esophagitis: Secondary | ICD-10-CM | POA: Diagnosis present

## 2022-06-28 DIAGNOSIS — I480 Paroxysmal atrial fibrillation: Secondary | ICD-10-CM

## 2022-06-28 DIAGNOSIS — E785 Hyperlipidemia, unspecified: Secondary | ICD-10-CM | POA: Diagnosis present

## 2022-06-28 HISTORY — PX: LEFT ATRIAL APPENDAGE OCCLUSION: EP1229

## 2022-06-28 HISTORY — DX: Nausea with vomiting, unspecified: Z98.890

## 2022-06-28 HISTORY — PX: TEE WITHOUT CARDIOVERSION: SHX5443

## 2022-06-28 HISTORY — DX: Presence of other cardiac implants and grafts: Z95.818

## 2022-06-28 HISTORY — DX: Nausea with vomiting, unspecified: R11.2

## 2022-06-28 LAB — TYPE AND SCREEN
ABO/RH(D): O POS
Antibody Screen: NEGATIVE

## 2022-06-28 LAB — POCT ACTIVATED CLOTTING TIME: Activated Clotting Time: 385 seconds

## 2022-06-28 SURGERY — LEFT ATRIAL APPENDAGE OCCLUSION
Anesthesia: General

## 2022-06-28 MED ORDER — LACTATED RINGERS IV SOLN: INTRAVENOUS | Status: DC | PRN

## 2022-06-28 MED ORDER — SUGAMMADEX SODIUM 200 MG/2ML IV SOLN
INTRAVENOUS | Status: DC | PRN
  Administered 2022-06-28: 181.4 mg via INTRAVENOUS

## 2022-06-28 MED ORDER — SODIUM CHLORIDE 0.9 % IV SOLN: INTRAVENOUS | Status: DC

## 2022-06-28 MED ORDER — ONDANSETRON HCL 4 MG/2ML IJ SOLN
INTRAMUSCULAR | Status: DC | PRN
  Administered 2022-06-28: 4 mg via INTRAVENOUS

## 2022-06-28 MED ORDER — HEPARIN (PORCINE) IN NACL 2000-0.9 UNIT/L-% IV SOLN
INTRAVENOUS | Status: DC | PRN
  Administered 2022-06-28: 1000 mL

## 2022-06-28 MED ORDER — DEXAMETHASONE SODIUM PHOSPHATE 10 MG/ML IJ SOLN
INTRAMUSCULAR | Status: DC | PRN
  Administered 2022-06-28: 10 mg via INTRAVENOUS

## 2022-06-28 MED ORDER — ACETAMINOPHEN 500 MG PO TABS
1000.0000 mg | ORAL_TABLET | Freq: Once | ORAL | Status: AC
  Administered 2022-06-28: 1000 mg via ORAL
  Filled 2022-06-28: qty 2

## 2022-06-28 MED ORDER — CHLORHEXIDINE GLUCONATE 0.12 % MT SOLN: 15.0000 mL | Freq: Once | OROMUCOSAL | Status: AC

## 2022-06-28 MED ORDER — ACETAMINOPHEN 325 MG PO TABS: 650.0000 mg | ORAL_TABLET | ORAL | Status: DC | PRN

## 2022-06-28 MED ORDER — SODIUM CHLORIDE 0.9 % IV SOLN: 250.0000 mL | INTRAVENOUS | Status: DC | PRN

## 2022-06-28 MED ORDER — CHLORHEXIDINE GLUCONATE 0.12 % MT SOLN
OROMUCOSAL | Status: AC
  Administered 2022-06-28: 15 mL via OROMUCOSAL
  Filled 2022-06-28: qty 15

## 2022-06-28 MED ORDER — CEFAZOLIN SODIUM-DEXTROSE 2-4 GM/100ML-% IV SOLN
2.0000 g | INTRAVENOUS | Status: AC
  Administered 2022-06-28: 2 g via INTRAVENOUS
  Filled 2022-06-28: qty 100

## 2022-06-28 MED ORDER — PHENYLEPHRINE HCL-NACL 20-0.9 MG/250ML-% IV SOLN
INTRAVENOUS | Status: DC | PRN
  Administered 2022-06-28: 30 ug/min via INTRAVENOUS

## 2022-06-28 MED ORDER — FENTANYL CITRATE (PF) 250 MCG/5ML IJ SOLN
INTRAMUSCULAR | Status: DC | PRN
  Administered 2022-06-28: 50 ug via INTRAVENOUS

## 2022-06-28 MED ORDER — SODIUM CHLORIDE 0.9% FLUSH: 3.0000 mL | INTRAVENOUS | Status: DC | PRN

## 2022-06-28 MED ORDER — ROCURONIUM BROMIDE 10 MG/ML (PF) SYRINGE
PREFILLED_SYRINGE | INTRAVENOUS | Status: DC | PRN
  Administered 2022-06-28: 60 mg via INTRAVENOUS

## 2022-06-28 MED ORDER — HEPARIN SODIUM (PORCINE) 1000 UNIT/ML IJ SOLN
INTRAMUSCULAR | Status: DC | PRN
  Administered 2022-06-28: 14000 [IU] via INTRAVENOUS

## 2022-06-28 MED ORDER — LIDOCAINE 2% (20 MG/ML) 5 ML SYRINGE
INTRAMUSCULAR | Status: DC | PRN
  Administered 2022-06-28: 80 mg via INTRAVENOUS

## 2022-06-28 MED ORDER — HEPARIN (PORCINE) IN NACL 1000-0.9 UT/500ML-% IV SOLN
INTRAVENOUS | Status: AC
  Filled 2022-06-28: qty 500

## 2022-06-28 MED ORDER — SODIUM CHLORIDE 0.9% FLUSH: 3.0000 mL | Freq: Two times a day (BID) | INTRAVENOUS | Status: DC

## 2022-06-28 MED ORDER — IOHEXOL 350 MG/ML SOLN
INTRAVENOUS | Status: DC | PRN
  Administered 2022-06-28: 10 mL

## 2022-06-28 MED ORDER — HEPARIN (PORCINE) IN NACL 2000-0.9 UNIT/L-% IV SOLN
INTRAVENOUS | Status: AC
  Filled 2022-06-28: qty 1000

## 2022-06-28 MED ORDER — ONDANSETRON HCL 4 MG/2ML IJ SOLN: 4.0000 mg | Freq: Four times a day (QID) | INTRAMUSCULAR | Status: DC | PRN

## 2022-06-28 MED ORDER — HEPARIN (PORCINE) IN NACL 1000-0.9 UT/500ML-% IV SOLN
INTRAVENOUS | Status: DC | PRN
  Administered 2022-06-28: 500 mL

## 2022-06-28 MED ORDER — PROTAMINE SULFATE 10 MG/ML IV SOLN
INTRAVENOUS | Status: DC | PRN
  Administered 2022-06-28: 35 mg via INTRAVENOUS

## 2022-06-28 MED ORDER — PROPOFOL 10 MG/ML IV BOLUS
INTRAVENOUS | Status: DC | PRN
  Administered 2022-06-28: 130 mg via INTRAVENOUS

## 2022-06-28 SURGICAL SUPPLY — 19 items
CATH INFINITI 5FR ANG PIGTAIL (CATHETERS) IMPLANT
CLOSURE PERCLOSE PROSTYLE (VASCULAR PRODUCTS) IMPLANT
DEVICE WATCHMAN FLX PROC (KITS) IMPLANT
DILATOR VESSEL 38 20CM 11FR (INTRODUCER) IMPLANT
KIT HEART LEFT (KITS) ×1 IMPLANT
KIT SHEA VERSACROSS LAAC CONNE (KITS) IMPLANT
MAT PREVALON FULL STRYKER (MISCELLANEOUS) IMPLANT
PACK CARDIAC CATHETERIZATION (CUSTOM PROCEDURE TRAY) ×1 IMPLANT
PAD DEFIB RADIO PHYSIO CONN (PAD) ×1 IMPLANT
SHEATH PERFORMER 16FR 30 (SHEATH) IMPLANT
SHEATH PINNACLE 8F 10CM (SHEATH) IMPLANT
SHEATH PROBE COVER 6X72 (BAG) ×1 IMPLANT
SYS WATCHMAN FXD DBL (SHEATH) ×1
SYSTEM WATCHMAN FXD DBL (SHEATH) IMPLANT
TRANSDUCER W/STOPCOCK (MISCELLANEOUS) ×1 IMPLANT
TUBING CIL FLEX 10 FLL-RA (TUBING) ×1 IMPLANT
WATCHMAN FLX 27 (Prosthesis & Implant Heart) IMPLANT
WATCHMAN FLX PROCEDURE DEVICE (KITS) ×1 IMPLANT
WATCHMAN PROCED TRUSEAL ACCESS (SHEATH) IMPLANT

## 2022-06-28 NOTE — Discharge Summary (Signed)
[facility]    Patient ID: Amy Williamson,  MRN: [id number], DOB/AGE: Jul 11, 1951 71 y.o.  Admit date: 06/28/2022 Discharge date: 06/28/2022  Primary Care Physician: Hoyt Koch, MD  Primary Cardiologist: None  Electrophysiologist: Vickie Epley, MD  Primary Discharge Diagnosis:  Persistent Atrial Fibrillation Poor candidacy for long term anticoagulation due to refusal of long-term oral anticoagulation  Secondary Discharge Diagnosis:  -HTN -aortic atherosclerosis -OSA -HLD  Procedures This Admission:  Transeptal Puncture Intra-procedural TEE which showed no LAA thrombus Left atrial appendage occlusive device placement on 06/28/22 by Dr. Quentin Ore.   This study demonstrated:  CONCLUSIONS:  1.Successful implantation of a WATCHMAN left atrial appendage occlusive device 2. TEE demonstrating no LAA thrombus 3. No early apparent complications.     Post Implant Anticoagulation Strategy: Continue Eliquis '5mg'$  PO BID x 45 days after Watchman implant and then transition to Plavix '75mg'$  PO daily to complete 6 months of post implant therapy. CT scan planned 60 days after implant to assess Watchman position.   Brief HPI: Amy Williamson is a 71 y.o. female with a history of HTN, aortic atherosclerosis, OSA, HLD, and atrial fibrillation who was referred to Dr. Quentin Ore for [place] placement scheduled for 06/28/22.    Amy Williamson is followed closely in the AF Clinic and was initially dx with AF 06/2020 and underwent DCCV on 07/28/20. She has been anticoagulated with Eliquis. She presented to the ED 09/26/21 with symptoms palpitations and underwent repeat DCCV but unfortunately returned to the ED on 10/06/21 and was back in AF once again. She was trialed on Flecainide but had intolerable side effects. She was seen by Dr Quentin Ore on 12/20/21 and was scheduled for AF ablation 03/29/22 with Watchman implant to follow.    Hospital Course:  The patient was admitted and underwent left  atrial appendage occlusive device placement with Watchman FLX 62m device.  She was monitored on telemetry in the post operative setting. Groin site has been without complication. Due to her stability, she is being considered for same day discharge today, 06/28/22. Wound care and restrictions were reviewed with the patient. The patient has been scheduled for post procedure follow up with JKathyrn Drown NP in approximately 1 month. Medication plan will be to continue Eliquis '5mg'$  PO BID x 45 days after Watchman implant and then transition to Plavix '75mg'$  PO daily to complete 6 months of post implant therapy. CT scan planned 60 days after implant to assess Watchman position. Patient will require 6 months of dental SBE with Amoxicillin.   HTN: BP noted to be elevated post procedure however antihypertensives have been held today. Will restart after discharge and follow in the OP setting.    Physical Exam: Vitals:   06/28/22 1435 06/28/22 1450 06/28/22 1520 06/28/22 1535  BP: (!) 171/69 (!) 160/64 (!) 148/64 (!) 148/65  Pulse: 82 77 77 80  Resp: '13 18 15 17  '$ Temp:      TempSrc:      SpO2: 98% 97% 94% 96%  Weight:      Height:       General: Well developed, well nourished, NAD Lungs:Clear to ausculation bilaterally. No wheezes, rales, or rhonchi. Breathing is unlabored. Cardiovascular: RRR with S1 S2. No murmurs Extremities: No edema. Groin site stable.  Neuro: Alert and oriented. No focal deficits. No facial asymmetry. MAE spontaneously. Psych: Responds to questions appropriately with normal affect.    Labs:   Lab Results  Component Value Date   WBC  7.9 06/18/2022   HGB 12.2 06/18/2022   HCT 37.7 06/18/2022   MCV 87 06/18/2022   PLT 230 06/18/2022   No results for input(s): "NA", "K", "CL", "CO2", "BUN", "CREATININE", "CALCIUM", "PROT", "BILITOT", "ALKPHOS", "ALT", "AST", "GLUCOSE" in the last 168 hours.  Invalid input(s): "LABALBU"   Discharge Medications:  Allergies as of 06/28/2022        Reactions   Tramadol Itching, Rash   May have been shingles instead of allergy per patient   Atenolol    Decreased blood pressure   Avelox [moxifloxacin Hcl In Nacl]    Pt did not like side effects   Azithromycin    ?causes muscle weakness, dysphagia   Doxycycline Nausea Only   And heartburn   Myrbetriq [mirabegron]    Increased blood pressure   Naproxen    Can not take while on Eliquis   Oxybutynin Palpitations   Per pt made her have rapid heart beat        Medication List     TAKE these medications    acetaminophen 650 MG CR tablet Commonly known as: TYLENOL Take 1,300 mg by mouth in the morning and at bedtime.   diltiazem 240 MG 24 hr capsule Commonly known as: CARDIZEM CD TAKE 1 CAPSULE BY MOUTH EVERY DAY   Eliquis 5 MG Tabs tablet Generic drug: apixaban TAKE 1 TABLET BY MOUTH TWICE A DAY Notes to patient: Restart Eliquis TONIGHT, 06/28/22   flecainide 50 MG tablet Commonly known as: TAMBOCOR Take 0.5 tablets (25 mg total) by mouth 2 (two) times daily as needed (breakthrough afib).   fluticasone 50 MCG/ACT nasal spray Commonly known as: FLONASE Place 2 sprays into both nostrils daily as needed for allergies or rhinitis.   pantoprazole 40 MG tablet Commonly known as: PROTONIX Take 40 mg by mouth daily.   rosuvastatin 20 MG tablet Commonly known as: CRESTOR TAKE 1 TABLET BY MOUTH EVERY DAY   traZODone 50 MG tablet Commonly known as: DESYREL Take 50 mg by mouth at bedtime.        Disposition:  Home  Discharge Instructions     Call MD for:  difficulty breathing, headache or visual disturbances   Complete by: As directed    Call MD for:  extreme fatigue   Complete by: As directed    Call MD for:  hives   Complete by: As directed    Call MD for:  persistant dizziness or light-headedness   Complete by: As directed    Call MD for:  persistant nausea and vomiting   Complete by: As directed    Call MD for:  redness, tenderness, or signs of  infection (pain, swelling, redness, odor or green/yellow discharge around incision site)   Complete by: As directed    Call MD for:  severe uncontrolled pain   Complete by: As directed    Call MD for:  temperature >100.4   Complete by: As directed    Diet - low sodium heart healthy   Complete by: As directed    Discharge instructions   Complete by: As directed    [facility] Procedure, Care After  Procedure MD: Dr. Benson Norway Clinical Coordinator: Lenice Llamas, RN  This sheet gives you information about how to care for yourself after your procedure. Your health care provider may also give you more specific instructions. If you have problems or questions, contact your health care provider.  What can I expect after the procedure? After the procedure, it is common to  have: Bruising around your puncture site. Tenderness around your puncture site. Tiredness (fatigue).  Medication instructions It is very important to continue to take your blood thinner as directed by your doctor after the Watchman procedure. Call your procedure doctor's office with question or concerns. If you are on Coumadin (warfarin), you will have your INR checked the week after your procedure, with a goal INR of 2.0 - 3.0. Please follow your medication instructions on your discharge summary. Only take the medications listed on your discharge paperwork.  Follow up You will be seen in 1 month after your procedure You will have a repeat CT scan approximately 8 weeks after your procedure mark to check your device You will follow up the MD/APP who performed your procedure 6 months after your procedure The Watchman Clinical Coordinator will check in with you from time to time, including 1 and 2 years after your procedure.    Follow these instructions at home: Puncture site care  Follow instructions from your health care provider about how to take care of your puncture site. Make sure you: If present, leave stitches  (sutures), skin glue, or adhesive strips in place.  If a large square bandage is present, this may be removed 24 hours after surgery.  Check your puncture site every day for signs of infection. Check for: Redness, swelling, or pain. Fluid or blood. If your puncture site starts to bleed, lie down on your back, apply firm pressure to the area, and contact your health care provider. Warmth. Pus or a bad smell. Driving Do not drive yourself home if you received sedation Do not drive for at least 4 days after your procedure or however long your health care provider recommends. (Do not resume driving if you have previously been instructed not to drive for other health reasons.) Do not spend greater than 1 hour at a time in a car for the first 3 days. Stop and take a break with a 5 minute walk at least every hour.  Do not drive or use heavy machinery while taking prescription pain medicine.  Activity Avoid activities that take a lot of effort, including exercise, for at least 7 days after your procedure. For the first 3 days, avoid sitting for longer than one hour at a time.  Avoid alcoholic beverages, signing paperwork, or participating in legal proceedings for 24 hours after receiving sedation Do not lift anything that is heavier than 10 lb (4.5 kg) for one week.  No sexual activity for 1 week.  Return to your normal activities as told by your health care provider. Ask your health care provider what activities are safe for you. General instructions Take over-the-counter and prescription medicines only as told by your health care provider. Do not use any products that contain nicotine or tobacco, such as cigarettes and e-cigarettes. If you need help quitting, ask your health care provider. You may shower after 24 hours, but Do not take baths, swim, or use a hot tub for 1 week.  Do not drink alcohol for 24 hours after your procedure. Keep all follow-up visits as told by your health care provider.  This is important. Dental Work: You will require antibiotics prior to any dental work, including cleanings, for 6 months after your Watchman implantation to help protect you from infection. After 6 months, antibiotics are no longer required. Contact a health care provider if: You have redness, mild swelling, or pain around your puncture site. You have soreness in your throat or at your  puncture site that does not improve after several days You have fluid or blood coming from your puncture site that stops after applying firm pressure to the area. Your puncture site feels warm to the touch. You have pus or a bad smell coming from your puncture site. You have a fever. You have chest pain or discomfort that spreads to your neck, jaw, or arm. You are sweating a lot. You feel nauseous. You have a fast or irregular heartbeat. You have shortness of breath. You are dizzy or light-headed and feel the need to lie down. You have pain or numbness in the arm or leg closest to your puncture site. Get help right away if: Your puncture site suddenly swells. Your puncture site is bleeding and the bleeding does not stop after applying firm pressure to the area. These symptoms may represent a serious problem that is an emergency. Do not wait to see if the symptoms will go away. Get medical help right away. Call your local emergency services (911 in the [place] Do not drive yourself to the hospital. Summary After the procedure, it is normal to have bruising and tenderness at the puncture site in your groin, neck, or forearm. Check your puncture site every day for signs of infection. Get help right away if your puncture site is bleeding and the bleeding does not stop after applying firm pressure to the area. This is a medical emergency.  This information is not intended to replace advice given to you by your health care provider. Make sure you discuss any questions you have with your health care provider.    Increase activity slowly   Complete by: As directed        Follow-up Information     Werner Lean, MD Follow up on 07/25/2022.   Specialty: Cardiology Why: @ 8:40am. Please arrive at 8:20am. Contact information: [place] [address] [phone]                 Duration of Discharge Encounter: Greater than 30 minutes including physician time.  Signed, Kathyrn Drown, NP  06/28/2022 4:07 PM

## 2022-06-28 NOTE — Discharge Instructions (Signed)

## 2022-06-28 NOTE — Transfer of Care (Signed)
Immediate Anesthesia Transfer of Care Note  Patient: Amy Williamson  Procedure(s) Performed: LEFT ATRIAL APPENDAGE OCCLUSION TRANSESOPHAGEAL ECHOCARDIOGRAM (TEE)  Patient Location: PACU  Anesthesia Type:General  Level of Consciousness: awake and alert   Airway & Oxygen Therapy: Patient Spontanous Breathing  Post-op Assessment: Report given to RN and Post -op Vital signs reviewed and stable  Post vital signs: Reviewed and stable  Last Vitals:  Vitals Value Taken Time  BP 136/51 06/28/22 1038  Temp    Pulse 69 06/28/22 1040  Resp 19 06/28/22 1040  SpO2 94 % 06/28/22 1040  Vitals shown include unvalidated device data.  Last Pain:  Vitals:   06/28/22 0738  TempSrc:   PainSc: 0-No pain         Complications: There were no known notable events for this encounter.

## 2022-06-28 NOTE — Anesthesia Postprocedure Evaluation (Signed)
Anesthesia Post Note  Patient: Amy Williamson  Procedure(s) Performed: LEFT ATRIAL APPENDAGE OCCLUSION TRANSESOPHAGEAL ECHOCARDIOGRAM (TEE)     Patient location during evaluation: Cath Lab Anesthesia Type: General Level of consciousness: awake and alert Pain management: pain level controlled Vital Signs Assessment: post-procedure vital signs reviewed and stable Respiratory status: spontaneous breathing, nonlabored ventilation, respiratory function stable and patient connected to nasal cannula oxygen Cardiovascular status: blood pressure returned to baseline and stable Postop Assessment: no apparent nausea or vomiting Anesthetic complications: no   There were no known notable events for this encounter.  Last Vitals:  Vitals:   06/28/22 1350 06/28/22 1355  BP: (!) 145/67 (!) 164/59  Pulse: 80 79  Resp: (!) 23 20  Temp:    SpO2: 98% 97%    Last Pain:  Vitals:   06/28/22 1108  TempSrc: Temporal  PainSc:                  [place] Lighter

## 2022-06-28 NOTE — Progress Notes (Signed)
  [place] TEAM  Patient doing well s/p LAAO. She is hemodynamically stable. Groin site is stable. Plan for early ambulation after bedrest completed and hopeful discharge later today.    Kathyrn Drown NP-C Structural Heart Team  Pager: [phone] Phone: [phone]

## 2022-06-28 NOTE — H&P (Signed)
Electrophysiology Office Note:     Date:  06/28/2022    ID:  Amy Williamson, DOB Jan 08, 1951, MRN [id number]   PCP:  Hoyt Koch, MD  [facility] Cardiologist:  None  [facility] Electrophysiologist:  Vickie Epley, MD    Referring MD: Oliver Barre, PA    Chief Complaint: Atrial fibrillation   History of Present Illness:     Amy Williamson is a 71 y.o. female who presents for an evaluation of atrial fibrillation at the request of Amy Williamson. Their medical history includes hypertension, GERD, atrial fibrillation.  The patient last all Amy Williamson on October 11, 2021.  The patient has had multiple prior cardioversions.  She uses an oral device for obstructive sleep apnea.  At the last appointment with [facility], flecainide was started and the patient was referred to discuss catheter ablation.  She takes Eliquis for stroke prophylaxis. Based on phone notes, the patient converted normal sinus rhythm after a single dose of flecainide.   She has done well after her catheter ablation without recurrence and presents today for LAAO. She has a history of gastritis and GERD with esophageal dilations in the past increasing her risk for GI bleeding. She presents today for LAAO as a stroke risk mitigation strategy allowing her to avoid long term exposure to anticoagulation.   Objective      Past Medical History:  Diagnosis Date   Anticoagulant long-term use      eliquis --- managed by cardiology   Arthritis      BACK AND SHOULDERS   Depressive disorder, not elsewhere classified     Essential hypertension      followed by pcp and cardiology   Frequency of urination     GERD (gastroesophageal reflux disease)     History of dysphagia 2015    s/p egd w/ dilation, pt did not have stricture   History of gastritis     OSA (obstructive sleep apnea)      followed by dr t. turner---  study in epic 09-03-2020 moderate osa ,  pt scheduled for cpap titration 12-08-2020   PAF  (paroxysmal atrial fibrillation) [facility])      cardiologist-- [name]---- new onset 06-30-2020 at pcp office visit,  s/p DCCV 07-28-2020 successful   Palpitations 06/2020    11-16-2020  per pt denies palpitations since DCCV 07-28-2020   Plantar fasciitis 08/2017    left foot    Pure hypercholesterolemia             Past Surgical History:  Procedure Laterality Date   BREAST LUMPECTOMY WITH RADIOACTIVE SEED LOCALIZATION Left 06/09/2020    Procedure: LEFT BREAST LUMPECTOMY WITH RADIOACTIVE SEED LOCALIZATION;  Surgeon: Coralie Keens, MD;  Location: [facility];  Service: General;  Laterality: Left;  LMA   CARDIOVERSION N/A 07/28/2020    Procedure: CARDIOVERSION;  Surgeon: Werner Lean, MD;  Location: [place] [facility];  Service: Cardiovascular;  Laterality: N/A;   CARPAL TUNNEL RELEASE Bilateral right 2004;  left 2006   CATARACT EXTRACTION W/ INTRAOCULAR LENS  IMPLANT, BILATERAL   2016   CESAREAN SECTION   [id number]    BILATERAL TUBAL LIGATION W/ LAST C/S   COLONOSCOPY WITH PROPOFOL   last one 08/ 2013   [place] N/A 11/22/2020    Procedure: [place];  Surgeon: Megan Salon, MD;  Location: [place];  Service: Gynecology;  Laterality: N/A;   EAR CYST EXCISION  08/03/2011    patient states this is incorrect   ESOPHAGOGASTRODUODENOSCOPY (EGD) WITH ESOPHAGEAL DILATION   08/2013   KNEE ARTHROSCOPY W/ MENISCAL REPAIR Left 08-03-2011  '@[facility]'$     and excision baker's cyst   PULLEY RELEASE RIGHT TRIGGER FINGER Right 01/08/2011   TRIGGER FINGER RELEASE Bilateral left 05/06/14;  right  02-29-2014      Current Medications: Active Medications      Current Meds  Medication Sig   acetaminophen (TYLENOL) 650 MG CR tablet Take 650 mg by mouth in the morning and at bedtime.   apixaban (ELIQUIS) 5 MG TABS tablet Take 1 tablet (5 mg total) by mouth 2 (two) times daily.    chlorpheniramine (CHLOR-TRIMETON) 4 MG tablet Take 4 mg by mouth 2 (two) times daily as needed for allergies.   diltiazem (CARDIZEM CD) 240 MG 24 hr capsule TAKE 1 CAPSULE BY MOUTH EVERY DAY   famotidine (PEPCID) 40 MG tablet Take 40 mg by mouth at bedtime.   flecainide (TAMBOCOR) 50 MG tablet Take 0.5 tablets (25 mg total) by mouth 2 (two) times daily as needed (breakthrough afib).   fluticasone (FLONASE) 50 MCG/ACT nasal spray Place 2 sprays into both nostrils daily as needed for allergies or rhinitis.   omeprazole (PRILOSEC) 20 MG capsule TAKE 1 CAPSULE BY MOUTH EVERY DAY   rosuvastatin (CRESTOR) 20 MG tablet Take 1 tablet (20 mg total) by mouth daily.   traZODone (DESYREL) 50 MG tablet TAKE 0.5-1 TABLETS BY MOUTH AT BEDTIME AS NEEDED FOR SLEEP.        Allergies:   Atenolol, Avelox [moxifloxacin hcl in nacl], Azithromycin, Doxycycline, Myrbetriq [mirabegron], and Oxybutynin    Social History         Socioeconomic History   Marital status: Married      Spouse name: david   Number of children: 2   Years of education: Not on file   Highest education level: Not on file  Occupational History   Occupation: retired  Tobacco Use   Smoking status: Never   Smokeless tobacco: Never   Tobacco comments:      Never smoke 10/11/21  Vaping Use   Vaping Use: Never used  Substance and Sexual Activity   Alcohol use: No   Drug use: Never   Sexual activity: Not on file      Comment: BTL  Other Topics Concern   Not on file  Social History Narrative   Not on file    Social Determinants of Health       Financial Resource Strain: Low Risk    Difficulty of Paying Living Expenses: Not hard at all  Food Insecurity: No Food Insecurity   Worried About Charity fundraiser in the Last Year: Never true   [place] in the Last Year: Never true  Transportation Needs: No Transportation Needs   Lack of Transportation (Medical): No   Lack of Transportation (Non-Medical): No  Physical Activity:  Inactive   Days of Exercise per Week: 0 days   Minutes of Exercise per Session: 0 min  Stress: No Stress Concern Present   Feeling of Stress : Not at all  Social Connections: Moderately Integrated   Frequency of Communication with Friends and Family: Twice a week   Frequency of Social Gatherings with Friends and Family: Twice a week   Attends Religious Services: More than 4 times per year   Active Member of [employer] or Organizations: No   Attends Archivist Meetings: Never  Marital Status: Married      Family History: The patient's family history includes Clotting disorder in her father; Heart disease in her father; Heart failure in her mother; Hyperlipidemia in her mother; Hypertension in her mother; Liver cancer in her brother; Liver disease in her maternal aunt; Osteoporosis in her mother. There is no history of Colon cancer.   ROS:   Please see the history of present illness.    All other systems reviewed and are negative.   EKGs/Labs/Other Studies Reviewed:     The following studies were reviewed today:   August 10, 2020 echo Normal left ventricular function, 60% Right ventricular function normal Trivial MR     EKG:  The ekg ordered today demonstrates sinus rhythm     Recent Labs: 07/03/2021: ALT 12 09/26/2021: B Natriuretic Peptide 200.5 10/06/2021: BUN 20; Creatinine, Ser 0.89; Hemoglobin 14.0; Magnesium 2.5; Platelets 269; Potassium 4.0; Sodium 139 10/11/2021: TSH 1.220  Recent Lipid Panel Labs (Brief)          Component Value Date/Time    CHOL 155 05/09/2021 0732    TRIG 118 05/09/2021 0732    TRIG 54 06/25/2006 0725    HDL 62 05/09/2021 0732    CHOLHDL 2.5 05/09/2021 0732    CHOLHDL 3 06/30/2020 0903    VLDL 23.2 06/30/2020 0903    LDLCALC 72 05/09/2021 0732    LDLDIRECT 142.3 03/16/2013 0732        Physical Exam:     VS:  BP 162/72   Pulse 81   Ht 5' (1.524 m)   Wt 215 lb 9.6 oz (97.8 kg)   LMP 07/23/2004   SpO2 97%   BMI 42.11 kg/m          Wt Readings from Last 3 Encounters:  12/20/21 215 lb 9.6 oz (97.8 kg)  10/19/21 209 lb (94.8 kg)  10/11/21 209 lb 9.6 oz (95.1 kg)      GEN:  Well nourished, well developed in no acute distress HEENT: Normal NECK: No JVD; No carotid bruits LYMPHATICS: No lymphadenopathy CARDIAC: RRR, no murmurs, rubs, gallops RESPIRATORY:  Clear to auscultation without rales, wheezing or rhonchi  ABDOMEN: Soft, non-tender, non-distended MUSCULOSKELETAL:  No edema; No deformity  SKIN: Warm and dry NEUROLOGIC:  Alert and oriented x 3 PSYCHIATRIC:  Normal affect          Assessment ASSESSMENT:     1. Persistent atrial fibrillation ([place])   2. OSA (obstructive sleep apnea)   3. Essential hypertension   4. Morbid obesity ([place])     PLAN:     In order of problems listed above:   #Persistent atrial fibrillation Doing well after her ablation. She prefers a stroke risk mitigation strategy that avoids long term exposure to anticoagulation given her risk of bleeding while on this therapy. She presents for LAAO today.  ------------   I have seen [name] in the office today who is being considered for a Watchman left atrial appendage closure device. I believe they will benefit from this procedure given their history of atrial fibrillation, CHA2DS2-VASc score of 4 and unadjusted ischemic stroke rate of 4.2% per year. The patient's chart has been reviewed and I feel that they would be a candidate for short term oral anticoagulation after Watchman implant.    It is my belief that after undergoing a LAA closure procedure, BLONDELL LAPERLE will not need long term anticoagulation which eliminates anticoagulation side effects and major bleeding risk.    Procedural risks  for the Watchman implant have been reviewed with the patient including a 0.5% risk of stroke, <1% risk of perforation and <1% risk of device embolization.      The published clinical data on the safety and effectiveness of  WATCHMAN include but are not limited to the following: - Holmes DR, Mechele Claude, Sick P et al. for the PROTECT AF Investigators. Percutaneous closure of the left atrial appendage versus warfarin therapy for prevention of stroke in patients with atrial fibrillation: a randomised non-inferiority trial. Lancet 2009; 374: 534-42. Mechele Claude, Doshi SK, Abelardo Diesel D et al. on behalf of the PROTECT AF Investigators. Percutaneous Left Atrial Appendage Closure for Stroke Prophylaxis in Patients With Atrial Fibrillation 2.3-Year Follow-up of the PROTECT AF (Watchman Left Atrial Appendage System for Embolic Protection in Patients With Atrial Fibrillation) Trial. Circulation 2013; 127:720-729. - Alli O, Doshi S,  Kar S, Reddy VY, Sievert H et al. Quality of Life Assessment in the Randomized PROTECT AF (Percutaneous Closure of the Left Atrial Appendage Versus Warfarin Therapy for Prevention of Stroke in Patients With Atrial Fibrillation) Trial of Patients at Risk for Stroke With Nonvalvular Atrial Fibrillation. J Am Coll Cardiol 2013; 25:3664-4. Vertell Limber DR, Tarri Abernethy, Price M, [place], Sievert H, Doshi S, Huber K, Reddy V. Prospective randomized evaluation of the Watchman left atrial appendage Device in patients with atrial fibrillation versus long-term warfarin therapy; the PREVAIL trial. Journal of the [employer] of Cardiology, Vol. 4, No. 1, 2014, 1-11. - Kar S, Doshi SK, Sadhu A, Horton R, Osorio J et al. Primary outcome evaluation of a next-generation left atrial appendage closure device: results from the PINNACLE FLX trial. Circulation 2021;143(18)1754-1762.      After today's visit with the patient which was dedicated solely for shared decision making visit regarding LAA closure device, the patient decided to proceed with the LAA appendage closure procedure scheduled to be done in the near future at [facility].     HAS-BLED score 2 Hypertension Yes  Abnormal renal and liver function  (Dialysis, transplant, Cr >2.26 mg/dL /Cirrhosis or Bilirubin >2x Normal or AST/ALT/AP >3x Normal) No  Stroke No  Bleeding No  Labile INR (Unstable/high INR) No  Elderly (>65) Yes  Drugs or alcohol (? 8 drinks/week, anti-plt or NSAID) No    CHA2DS2-VASc Score = 4  The patient's score is based upon: CHF History: 0 HTN History: 1 Diabetes History: 0 Stroke History: 0 Vascular Disease History: 1 Age Score: 1 Gender Score: 1        Signed, Lysbeth Galas T. Quentin Ore, MD, [facility], [facility] 06/28/2022 Electrophysiology [facility]

## 2022-06-28 NOTE — Progress Notes (Signed)
  Echocardiogram Echocardiogram Transesophageal has been performed.  Amy Williamson M 06/28/2022, 10:49 AM

## 2022-06-28 NOTE — Anesthesia Preprocedure Evaluation (Addendum)
Anesthesia Evaluation  Patient identified by MRN, date of birth, ID band Patient awake    Reviewed: Allergy & Precautions, NPO status , Patient's Chart, lab work & pertinent test results  History of Anesthesia Complications (+) PONV and history of anesthetic complications  Airway Mallampati: III  TM Distance: >3 FB Neck ROM: Full    Dental  (+) Teeth Intact, Dental Advisory Given   Pulmonary sleep apnea    Pulmonary exam normal breath sounds clear to auscultation       Cardiovascular hypertension, Pt. on medications (-) angina (-) Past MI and (-) Cardiac Stents Normal cardiovascular exam+ dysrhythmias Atrial Fibrillation  Rhythm:Regular Rate:Normal     Neuro/Psych  PSYCHIATRIC DISORDERS  Depression    negative neurological ROS     GI/Hepatic Neg liver ROS,GERD  Medicated,,  Endo/Other  negative endocrine ROS    Renal/GU negative Renal ROS     Musculoskeletal  (+) Arthritis ,    Abdominal   Peds  Hematology  (+) Blood dyscrasia (Eliquis)   Anesthesia Other Findings Day of surgery medications reviewed with the patient.  Reproductive/Obstetrics                             Anesthesia Physical Anesthesia Plan  ASA: 3  Anesthesia Plan: General   Post-op Pain Management: Tylenol PO (pre-op)*   Induction: Intravenous  PONV Risk Score and Plan: 4 or greater and Dexamethasone, Ondansetron and Propofol infusion  Airway Management Planned: Oral ETT  Additional Equipment: ClearSight  Intra-op Plan:   Post-operative Plan: Extubation in OR  Informed Consent: I have reviewed the patients History and Physical, chart, labs and discussed the procedure including the risks, benefits and alternatives for the proposed anesthesia with the patient or authorized representative who has indicated his/her understanding and acceptance.     Dental advisory given  Plan Discussed with:  CRNA  Anesthesia Plan Comments:        Anesthesia Quick Evaluation

## 2022-06-28 NOTE — Anesthesia Procedure Notes (Signed)
Procedure Name: Intubation Date/Time: 06/28/2022 9:36 AM  Performed by: Lorie Phenix, CRNAPre-anesthesia Checklist: Patient identified, Emergency Drugs available, Suction available and Patient being monitored Patient Re-evaluated:Patient Re-evaluated prior to induction Oxygen Delivery Method: Circle system utilized Preoxygenation: Pre-oxygenation with 100% oxygen Induction Type: IV induction Ventilation: Mask ventilation without difficulty Laryngoscope Size: Mac and 3 Grade View: Grade II Tube type: Oral Tube size: 7.0 mm Number of attempts: 1 Airway Equipment and Method: Stylet Placement Confirmation: ETT inserted through vocal cords under direct vision, positive ETCO2 and breath sounds checked- equal and bilateral Secured at: 22 cm Tube secured with: Tape Dental Injury: Teeth and Oropharynx as per pre-operative assessment

## 2022-06-29 ENCOUNTER — Encounter (HOSPITAL_COMMUNITY): Payer: Self-pay | Admitting: Cardiology

## 2022-06-29 ENCOUNTER — Telehealth: Payer: Self-pay

## 2022-06-29 NOTE — Telephone Encounter (Signed)
Patient stated that her incision are is now oozing blood.  Patient would like to know next steps.

## 2022-06-29 NOTE — Telephone Encounter (Signed)
I spoke with the pt and when she went to the bathroom earlier she noticed blood on her underwear and the guaze on her right groin was saturated with blood.  The pt held pressure and placed a new guaze over the area.  At this time the bleeding has stopped.  The pt denies any exertional activity and has been resting at home. I advised her to continue to monitor groin site and if she has bleeding again to hold pressure. Discussed with Kathyrn Drown NP and at this time the pt will continue Eliquis.  The pt was provided with the cardiology on call number is additional questions or concerns arise.

## 2022-06-29 NOTE — Telephone Encounter (Cosign Needed)
Spoke with patient for [facility] call after watchman procedure. She states she is doing well and her husband says her groin looks great. She restarted her eliquis last night and has no questions. She is aware of her follow up appt with Dr. Gasper Sells on 07/25/21 at 8:40.

## 2022-07-02 ENCOUNTER — Ambulatory Visit (INDEPENDENT_AMBULATORY_CARE_PROVIDER_SITE_OTHER): Payer: Medicare Other

## 2022-07-02 ENCOUNTER — Telehealth: Payer: Self-pay

## 2022-07-02 ENCOUNTER — Other Ambulatory Visit: Payer: Self-pay

## 2022-07-02 VITALS — Ht 60.0 in | Wt 200.0 lb

## 2022-07-02 DIAGNOSIS — K219 Gastro-esophageal reflux disease without esophagitis: Secondary | ICD-10-CM

## 2022-07-02 DIAGNOSIS — Z0000 Encounter for general adult medical examination without abnormal findings: Secondary | ICD-10-CM

## 2022-07-02 MED ORDER — PANTOPRAZOLE SODIUM 40 MG PO TBEC: 40.0000 mg | DELAYED_RELEASE_TABLET | Freq: Every day | ORAL | 2 refills | Status: DC

## 2022-07-02 NOTE — Telephone Encounter (Signed)
Patient requesting refill on Pantoprazole 40 mg to be sent to [facility] on file.

## 2022-07-02 NOTE — Patient Instructions (Signed)
Ms. Amy Williamson , Thank you for taking time to come for your Medicare Wellness Visit. I appreciate your ongoing commitment to your health goals. Please review the following plan we discussed and let me know if I can assist you in the future.   These are the goals we discussed:  Goals       Manage My Medicine      Timeframe:  Long-Range Goal Priority:  Medium Start Date: 07/25/2021                            Expected End Date: 07/25/2022                      Follow Up Date 06/2022   - call if I am sick and can't take my medicine - keep a list of all the medicines I take; vitamins and herbals too - learn to read medicine labels - use a pillbox to sort medicine - use an alarm clock or phone to remind me to take my medicine    Why is this important?   These steps will help you keep on track with your medicines.        Patient Stated (pt-stated)      My healthcare goal for 2024 is to have a better year.        This is a list of the screening recommended for you and due dates:  Health Maintenance  Topic Date Due   Colon Cancer Screening  03/12/2022   COVID-19 Vaccine (4 - 2023-24 season) 03/23/2022   Medicare Annual Wellness Visit  07/03/2023   Mammogram  07/07/2023   DTaP/Tdap/Td vaccine (3 - Td or Tdap) 08/03/2025   Pneumonia Vaccine  Completed   Flu Shot  Completed   DEXA scan (bone density measurement)  Completed   Hepatitis C Screening: USPSTF Recommendation to screen - Ages 47-79 yo.  Completed   Zoster (Shingles) Vaccine  Completed   HPV Vaccine  Aged Out    Advanced directives: Yes; documents on file.  Conditions/risks identified: Yes  Next appointment: Follow up in one year for your annual wellness visit.   Preventive Care 39 Years and Older, Female Preventive care refers to lifestyle choices and visits with your health care provider that can promote health and wellness. What does preventive care include? A yearly physical exam. This is also called an annual well  check. Dental exams once or twice a year. Routine eye exams. Ask your health care provider how often you should have your eyes checked. Personal lifestyle choices, including: Daily care of your teeth and gums. Regular physical activity. Eating a healthy diet. Avoiding tobacco and drug use. Limiting alcohol use. Practicing safe sex. Taking low-dose aspirin every day. Taking vitamin and mineral supplements as recommended by your health care provider. What happens during an annual well check? The services and screenings done by your health care provider during your annual well check will depend on your age, overall health, lifestyle risk factors, and family history of disease. Counseling  Your health care provider may ask you questions about your: Alcohol use. Tobacco use. Drug use. Emotional well-being. Home and relationship well-being. Sexual activity. Eating habits. History of falls. Memory and ability to understand (cognition). Work and work Statistician. Reproductive health. Screening  You may have the following tests or measurements: Height, weight, and BMI. Blood pressure. Lipid and cholesterol levels. These may be checked every 5 years, or more frequently  if you are over 33 years old. Skin check. Lung cancer screening. You may have this screening every year starting at age 66 if you have a 30-pack-year history of smoking and currently smoke or have quit within the past 15 years. Fecal occult blood test (FOBT) of the stool. You may have this test every year starting at age 11. Flexible sigmoidoscopy or colonoscopy. You may have a sigmoidoscopy every 5 years or a colonoscopy every 10 years starting at age 72. Hepatitis C blood test. Hepatitis B blood test. Sexually transmitted disease (STD) testing. Diabetes screening. This is done by checking your blood sugar (glucose) after you have not eaten for a while (fasting). You may have this done every 1-3 years. Bone density scan.  This is done to screen for osteoporosis. You may have this done starting at age 55. Mammogram. This may be done every 1-2 years. Talk to your health care provider about how often you should have regular mammograms. Talk with your health care provider about your test results, treatment options, and if necessary, the need for more tests. Vaccines  Your health care provider may recommend certain vaccines, such as: Influenza vaccine. This is recommended every year. Tetanus, diphtheria, and acellular pertussis (Tdap, Td) vaccine. You may need a Td booster every 10 years. Zoster vaccine. You may need this after age 21. Pneumococcal 13-valent conjugate (PCV13) vaccine. One dose is recommended after age 76. Pneumococcal polysaccharide (PPSV23) vaccine. One dose is recommended after age 68. Talk to your health care provider about which screenings and vaccines you need and how often you need them. This information is not intended to replace advice given to you by your health care provider. Make sure you discuss any questions you have with your health care provider. Document Released: 08/05/2015 Document Revised: 03/28/2016 Document Reviewed: 05/10/2015 Elsevier Interactive Patient Education  2017 [place] Prevention in the Home Falls can cause injuries. They can happen to people of all ages. There are many things you can do to make your home safe and to help prevent falls. What can I do on the outside of my home? Regularly fix the edges of walkways and driveways and fix any cracks. Remove anything that might make you trip as you walk through a door, such as a raised step or threshold. Trim any bushes or trees on the path to your home. Use bright outdoor lighting. Clear any walking paths of anything that might make someone trip, such as rocks or tools. Regularly check to see if handrails are loose or broken. Make sure that both sides of any steps have handrails. Any raised decks and porches  should have guardrails on the edges. Have any leaves, snow, or ice cleared regularly. Use sand or salt on walking paths during winter. Clean up any spills in your garage right away. This includes oil or grease spills. What can I do in the bathroom? Use night lights. Install grab bars by the toilet and in the tub and shower. Do not use towel bars as grab bars. Use non-skid mats or decals in the tub or shower. If you need to sit down in the shower, use a plastic, non-slip stool. Keep the floor dry. Clean up any water that spills on the floor as soon as it happens. Remove soap buildup in the tub or shower regularly. Attach bath mats securely with double-sided non-slip rug tape. Do not have throw rugs and other things on the floor that can make you trip. What can I  do in the bedroom? Use night lights. Make sure that you have a light by your bed that is easy to reach. Do not use any sheets or blankets that are too big for your bed. They should not hang down onto the floor. Have a firm chair that has side arms. You can use this for support while you get dressed. Do not have throw rugs and other things on the floor that can make you trip. What can I do in the kitchen? Clean up any spills right away. Avoid walking on wet floors. Keep items that you use a lot in easy-to-reach places. If you need to reach something above you, use a strong step stool that has a grab bar. Keep electrical cords out of the way. Do not use floor polish or wax that makes floors slippery. If you must use wax, use non-skid floor wax. Do not have throw rugs and other things on the floor that can make you trip. What can I do with my stairs? Do not leave any items on the stairs. Make sure that there are handrails on both sides of the stairs and use them. Fix handrails that are broken or loose. Make sure that handrails are as long as the stairways. Check any carpeting to make sure that it is firmly attached to the stairs.  Fix any carpet that is loose or worn. Avoid having throw rugs at the top or bottom of the stairs. If you do have throw rugs, attach them to the floor with carpet tape. Make sure that you have a light switch at the top of the stairs and the bottom of the stairs. If you do not have them, ask someone to add them for you. What else can I do to help prevent falls? Wear shoes that: Do not have high heels. Have rubber bottoms. Are comfortable and fit you well. Are closed at the toe. Do not wear sandals. If you use a stepladder: Make sure that it is fully opened. Do not climb a closed stepladder. Make sure that both sides of the stepladder are locked into place. Ask someone to hold it for you, if possible. Clearly mark and make sure that you can see: Any grab bars or handrails. First and last steps. Where the edge of each step is. Use tools that help you move around (mobility aids) if they are needed. These include: Canes. Walkers. Scooters. Crutches. Turn on the lights when you go into a dark area. Replace any light bulbs as soon as they burn out. Set up your furniture so you have a clear path. Avoid moving your furniture around. If any of your floors are uneven, fix them. If there are any pets around you, be aware of where they are. Review your medicines with your doctor. Some medicines can make you feel dizzy. This can increase your chance of falling. Ask your doctor what other things that you can do to help prevent falls. This information is not intended to replace advice given to you by your health care provider. Make sure you discuss any questions you have with your health care provider. Document Released: 05/05/2009 Document Revised: 12/15/2015 Document Reviewed: 08/13/2014 Elsevier Interactive Patient Education  2017 [employer].

## 2022-07-02 NOTE — Progress Notes (Signed)
Virtual Visit via Telephone Note  I connected with  Amy Williamson on 07/02/22 at  1:00 PM EST by telephone and verified that I am speaking with the correct person using two identifiers.  Location: Patient: Home Provider: [place] Persons participating in the virtual visit: Day   I discussed the limitations, risks, security and privacy concerns of performing an evaluation and management service by telephone and the availability of in person appointments. The patient expressed understanding and agreed to proceed.  Interactive audio and video telecommunications were attempted between this nurse and patient, however failed, due to patient having technical difficulties OR patient did not have access to video capability.  We continued and completed visit with audio only.  Some vital signs may be absent or patient reported.   Amy Flow, LPN  Subjective:   Amy Williamson is a 71 y.o. female who presents for Medicare Annual (Subsequent) preventive examination.  Review of Systems     Cardiac Risk Factors include: advanced age (>10mn, >>54women);dyslipidemia;hypertension;obesity (BMI >30kg/m2);family history of premature cardiovascular disease     Objective:    Today's Vitals   07/02/22 1304 07/02/22 1306  Weight: 200 lb (90.7 kg)   Height: 5' (1.524 m)   PainSc: 0-No pain 0-No pain   Body mass index is 39.06 kg/m.     07/02/2022    1:07 PM 06/28/2022    4:16 PM 06/28/2022    7:31 AM 03/30/2022    8:53 AM 02/09/2022   11:31 AM 10/06/2021    3:46 PM 09/26/2021   10:52 AM  Advanced Directives  Does Patient Have a Medical Advance Directive? Yes Yes Yes Yes Yes Yes No;Yes  Type of AParamedicof [place]ed CreekLiving will Living will [place]Living will Living will;Healthcare Power of ACulver CityLiving will HPinehurstLiving will HDecherdLiving will  Does patient want to make changes to medical advance directive? No - Patient declined Yes (Inpatient - patient defers changing a medical advance directive at this time - Information given)  No - Patient declined No - Patient declined    Copy of [place]in Chart? No - copy requested  Yes - validated most recent copy scanned in chart (See row information)  No - copy requested      Current Medications (verified) Outpatient Encounter Medications as of 07/02/2022  Medication Sig   acetaminophen (TYLENOL) 650 MG CR tablet Take 1,300 mg by mouth in the morning and at bedtime.   diltiazem (CARDIZEM CD) 240 MG 24 hr capsule TAKE 1 CAPSULE BY MOUTH EVERY DAY   ELIQUIS 5 MG TABS tablet TAKE 1 TABLET BY MOUTH TWICE A DAY   flecainide (TAMBOCOR) 50 MG tablet Take 0.5 tablets (25 mg total) by mouth 2 (two) times daily as needed (breakthrough afib).   fluticasone (FLONASE) 50 MCG/ACT nasal spray Place 2 sprays into both nostrils daily as needed for allergies or rhinitis.   pantoprazole (PROTONIX) 40 MG tablet Take 40 mg by mouth daily.   rosuvastatin (CRESTOR) 20 MG tablet TAKE 1 TABLET BY MOUTH EVERY DAY   traZODone (DESYREL) 50 MG tablet Take 50 mg by mouth at bedtime.   No facility-administered encounter medications on file as of 07/02/2022.    Allergies (verified) Tramadol, Atenolol, Avelox [moxifloxacin hcl in nacl], Azithromycin, Doxycycline, Myrbetriq [mirabegron], Naproxen, and Oxybutynin   History: Past Medical History:  Diagnosis Date   Anticoagulant long-term use  eliquis --- managed by cardiology   Arthritis    BACK AND SHOULDERS   Depressive disorder, not elsewhere classified    Essential hypertension    followed by pcp and cardiology   Frequency of urination    GERD (gastroesophageal reflux disease)    History of dysphagia 2015   s/p egd w/ dilation, pt did not have stricture   History of gastritis    OSA (obstructive sleep apnea)     followed by dr t. turner---  study in epic 09-03-2020 moderate osa ,  pt scheduled for cpap titration 12-08-2020   PAF (paroxysmal atrial fibrillation) [facility])    cardiologist-- 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 LQuentin Ore  Pure hypercholesterolemia    Past Surgical History:  Procedure Laterality Date   ATRIAL FIBRILLATION ABLATION N/A 03/30/2022   Procedure: ATRIAL FIBRILLATION ABLATION;  Surgeon: LVickie Epley MD;  [place];  Service: Cardiovascular;  Laterality: N/A;   BREAST LUMPECTOMY WITH RADIOACTIVE SEED LOCALIZATION Left 06/09/2020   Procedure: LEFT BREAST LUMPECTOMY WITH RADIOACTIVE SEED LOCALIZATION;  Surgeon: BCoralie Keens MD;  [place] [facility]  Service: General;  Laterality: Left;  LMA   CARDIOVERSION N/A 07/28/2020   Procedure: CARDIOVERSION;  Surgeon: CWerner Lean MD;  [facility];  Service: Cardiovascular;  Laterality: N/A;   CARPAL TUNNEL RELEASE Bilateral right 2004;  left 2006   CATARACT EXTRACTION W/ INTRAOCULAR LENS  IMPLANT, BILATERAL  2016   CESAREAN SECTION  [id number]  BILATERAL TUBAL LIGATION W/ LAST C/S   COLONOSCOPY WITH PROPOFOL  last one 08/ 2013   DILATATION & CURETTAGE/HYSTEROSCOPY WITH MYOSURE N/A 11/22/2020   Procedure: [place]  Surgeon: MMegan Salon MD;  [place]  Service: Gynecology;  Laterality: N/A;   EAR CYST EXCISION  08/03/2011   patient states this is incorrect was a Baker's cyst popliteal area   ESOPHAGOGASTRODUODENOSCOPY  (EGD) WITH ESOPHAGEAL DILATION  08/2013   KNEE ARTHROSCOPY W/ MENISCAL REPAIR Left 08-03-2011  '@[facility]'$    and excision baker's cyst   LEFT ATRIAL APPENDAGE OCCLUSION N/A 06/28/2022   Procedure: LEFT ATRIAL APPENDAGE OCCLUSION;  Surgeon: LVickie Epley MD;  [facility];  Service: Cardiovascular;  Laterality: N/A;   PULLEY RELEASE RIGHT TRIGGER FINGER Right 01/08/2011   TEE WITHOUT CARDIOVERSION N/A 06/28/2022   Procedure: TRANSESOPHAGEAL ECHOCARDIOGRAM (TEE);  Surgeon: LVickie Epley MD;  [place];  Service: Cardiovascular;  Laterality: N/A;   TRIGGER FINGER RELEASE Bilateral left 05/06/14;  right  02-29-2014   Family History  Problem Relation Age of Onset   Heart failure Mother    Osteoporosis Mother    Hyperlipidemia Mother    Hypertension Mother    Heart disease Father    Clotting disorder Father    Liver cancer Brother    Liver disease Maternal Aunt        x2   Colon cancer Neg Hx    Social History   Socioeconomic History   Marital status: Married    Spouse name: JSonia Side  Number of children: 2   Years of education: Not on file   Highest education level: Not on file  Occupational History   Occupation: retired  Tobacco Use   Smoking status: Never   Smokeless tobacco: Never   Tobacco comments:    Never smoke 10/11/21  Vaping Use   Vaping Use: Never used  Substance and Sexual Activity   Alcohol use: No   Drug use: Never   Sexual activity: Not Currently    Birth control/protection: Surgical, Post-menopausal    Comment: BTL  Other Topics Concern   Not on file  Social History Narrative   Not on file   Social Determinants of Health   Financial Resource Strain: Low Risk  (07/02/2022)   Overall Financial Resource Strain (CARDIA)    Difficulty of Paying Living Expenses: Not hard at all  Food Insecurity: No Food Insecurity (07/02/2022)   Hunger Vital Sign    Worried About Running Out of Food in the Last Year: Never true    Ran  Out of Food in the Last Year: Never true  Transportation Needs: No Transportation Needs (07/02/2022)   PRAPARE - Hydrologist (Medical): No    Lack of Transportation (Non-Medical): No  Physical Activity: Sufficiently Active (07/02/2022)   Exercise Vital Sign    Days of Exercise per Week: 5 days    Minutes of Exercise per Session: 30 min  Stress: No Stress Concern Present (07/02/2022)   [place]    Feeling of Stress : Not at all  Social Connections: Moderately Integrated (07/02/2022)   Social Connection and Isolation Panel [NHANES]    Frequency of Communication with Friends and Family: Twice a week    Frequency of Social Gatherings with Friends and Family: Twice a week    Attends Religious Services: More than 4 times per year    Active Member of [employer] or Organizations: No    Attends Archivist Meetings: Never    Marital Status: Married    Tobacco Counseling Counseling given: Not Answered Tobacco comments: Never smoke 10/11/21   Clinical Intake:  Pre-visit preparation completed: Yes  Pain : No/denies pain Pain Score: 0-No pain     BMI - recorded: 39.06 Nutritional Status: BMI > 30  Obese Nutritional Risks: None Diabetes: No  How often do you need to have someone help you when you read instructions, pamphlets, or other written materials from your doctor or pharmacy?: 1 - Never What is the last grade level you completed in school?: HSG  Diabetic? no  Interpreter Needed?: No  Information entered by :: Lisette Abu, LPN.   Activities of Daily Living    07/02/2022    1:13 PM 06/28/2022    4:19 PM  In your present state of health, do you have any difficulty performing the following activities:  Hearing? 0   Vision? 0   Difficulty concentrating or making decisions? 0   Walking or climbing stairs? 0   Dressing or bathing? 0   Doing errands, shopping? 0 0   Preparing Food and eating ? N   Using the Toilet? N   In the past six months, have you accidently leaked urine? Y   Do you have problems with loss of bowel control? N   Managing your Medications? N   Managing your Finances? N   Housekeeping or managing your Housekeeping? N     Patient Care Team: Hoyt Koch, MD as PCP -  General (Internal Medicine) Sueanne Margarita, MD as PCP - Sleep Medicine (Cardiology) Vickie Epley, MD as PCP - Electrophysiology (Cardiology) Lafayette Dragon, MD (Inactive) (Gastroenterology) Megan Salon, MD (Gynecology) Peggye Form, MD (Gastroenterology) Calvert Cantor, MD (Ophthalmology) Roseanne Kaufman, MD (Orthopedic Surgery) Szabat, Darnelle Maffucci, [facility] (Inactive) as Pharmacist (Pharmacist)  Indicate any recent Medical Services you may have received from other than [facility] providers in the past year (date may be approximate).     Assessment:   This is a routine wellness examination for [place].  Hearing/Vision screen Hearing Screening - Comments:: Denies hearing difficulties   Vision Screening - Comments:: Wears rx glasses - up to date with routine eye exams with [place] issues and exercise activities discussed: Current Exercise Habits: Home exercise routine, Type of exercise: walking;Other - see comments (Physical Therapy twice a week), Time (Minutes): 30, Frequency (Times/Week): 5, Weekly Exercise (Minutes/Week): 150, Intensity: Moderate, Exercise limited by: respiratory conditions(s)   Goals Addressed               This Visit's Progress     Patient Stated (pt-stated)        My healthcare goal for 2024 is to have a better year.      Depression Screen    07/02/2022    1:12 PM 01/02/2022    9:07 AM 06/30/2021    2:41 PM 05/15/2021    9:58 AM 09/19/2018    1:24 PM 05/09/2018    8:28 AM 05/07/2017    8:07 AM  PHQ 2/9 Scores  PHQ - 2 Score 0 0 0 0 4 0 0  PHQ- 9 Score  0   15      Fall Risk    07/02/2022     1:08 PM 01/02/2022    9:07 AM 06/30/2021    2:37 PM 06/30/2020    8:00 AM 05/12/2019    8:34 AM  Fall Risk   Falls in the past year? 0 0 0 0 0  Number falls in past yr: 0 0 0 0   Injury with Fall? 0 0 0 0   Risk for fall due to : No Fall Risks      Follow up Falls prevention discussed  Falls evaluation completed      FALL RISK PREVENTION PERTAINING TO THE HOME:  Any stairs in or around the home? No  If so, are there any without handrails? No  Home free of loose throw rugs in walkways, pet beds, electrical cords, etc? Yes  Adequate lighting in your home to reduce risk of falls? Yes   ASSISTIVE DEVICES UTILIZED TO PREVENT FALLS:  Life alert? No  Use of a cane, walker or w/c? No  Grab bars in the bathroom? Yes  Shower chair or bench in shower? Yes  Elevated toilet seat or a handicapped toilet? Yes   TIMED UP AND GO:  Was the test performed? No . Phone Visit  Cognitive Function:        07/02/2022    1:16 PM  6CIT Screen  What Year? 0 points  What month? 0 points  What time? 0 points  Count back from 20 0 points  Months in reverse 0 points  Repeat phrase 0 points  Total Score 0 points    Immunizations Immunization History  Administered Date(s) Administered   Fluad Quad(high Dose 65+) 04/05/2022   Influenza Split 04/24/2011, 05/03/2012, 04/22/2013   Influenza, High Dose Seasonal PF 03/30/2015, 04/02/2016, 05/07/2017, 05/09/2018, 04/17/2019, 03/28/2021  Influenza,inj,Quad PF,6+ Mos 03/16/2014   Influenza-Unspecified 04/17/2019, 04/06/2020   PFIZER(Purple Top)SARS-COV-2 Vaccination 09/15/2019, 10/06/2019, 04/26/2020   Pneumococcal Conjugate-13 03/30/2015   Pneumococcal Polysaccharide-23 07/23/2004, 05/07/2017   Tdap 07/23/2004, 08/04/2015   Zoster Recombinat (Shingrix) 05/17/2017, 10/02/2017   Zoster, Live 05/23/2013    TDAP status: Up to date  Flu Vaccine status: Up to date  Pneumococcal vaccine status: Up to date  Covid-19 vaccine status: Completed  vaccines  Qualifies for Shingles Vaccine? Yes   Zostavax completed Yes   Shingrix Completed?: Yes  Screening Tests Health Maintenance  Topic Date Due   COLONOSCOPY (Pts 45-52yr Insurance coverage will need to be confirmed)  03/12/2022   COVID-19 Vaccine (4 - 2023-24 season) 03/23/2022   Medicare Annual Wellness (AWV)  07/03/2023   MAMMOGRAM  07/07/2023   DTaP/Tdap/Td (3 - Td or Tdap) 08/03/2025   Pneumonia Vaccine 72 Years old  Completed   INFLUENZA VACCINE  Completed   DEXA SCAN  Completed   Hepatitis C Screening  Completed   Zoster Vaccines- Shingrix  Completed   HPV VACCINES  Aged Out    Health Maintenance  Health Maintenance Due  Topic Date Due   COLONOSCOPY (Pts 45-412yrInsurance coverage will need to be confirmed)  03/12/2022   COVID-19 Vaccine (4 - 2023-24 season) 03/23/2022    Colorectal cancer screening: Type of screening: Colonoscopy. Completed 03/12/2022. Repeat every 10 years  Mammogram status: Completed 07/06/2021. Repeat every year  Bone Density status: Completed 11/01/2021. Results reflect: Bone density results: OSTEOPENIA. Repeat every 2-3 years.  Lung Cancer Screening: (Low Dose CT Chest recommended if Age 72-80ears, 30 pack-year currently smoking OR have quit w/in 15years.) does not qualify.   Lung Cancer Screening Referral: no  Additional Screening:  Hepatitis C Screening: does qualify; Completed 08/04/2015  Vision Screening: Recommended annual ophthalmology exams for early detection of glaucoma and other disorders of the eye. Is the patient up to date with their annual eye exam?  Yes  Who is the provider or what is the name of the office in which the patient attends annual eye exams? [facility], would they like to be referred to a provider to establish care? No .   Dental Screening: Recommended annual dental exams for proper oral hygiene  Community Resource Referral / Chronic Care  Management: CRR required this visit?  No   CCM required this visit?  No      Plan:     I have personally reviewed and noted the following in the patient's chart:   Medical and social history Use of alcohol, tobacco or illicit drugs  Current medications and supplements including opioid prescriptions. Patient is not currently taking opioid prescriptions. Functional ability and status Nutritional status Physical activity Advanced directives List of other physicians Hospitalizations, surgeries, and ER visits in previous 12 months Vitals Screenings to include cognitive, depression, and falls Referrals and appointments  In addition, I have reviewed and discussed with patient certain preventive protocols, quality metrics, and best practice recommendations. A written personalized care plan for preventive services as well as general preventive health recommendations were provided to patient.     ShSheral FlowLPN   [id number] Nurse Notes: n/a

## 2022-07-04 ENCOUNTER — Ambulatory Visit (INDEPENDENT_AMBULATORY_CARE_PROVIDER_SITE_OTHER): Payer: Medicare Other | Admitting: Internal Medicine

## 2022-07-04 ENCOUNTER — Encounter: Payer: Self-pay | Admitting: Internal Medicine

## 2022-07-04 VITALS — BP 160/60 | HR 65 | Temp 98.6°F | Ht 60.0 in | Wt 200.0 lb

## 2022-07-04 DIAGNOSIS — E782 Mixed hyperlipidemia: Secondary | ICD-10-CM

## 2022-07-04 DIAGNOSIS — R5383 Other fatigue: Secondary | ICD-10-CM

## 2022-07-04 DIAGNOSIS — I700 Atherosclerosis of aorta: Secondary | ICD-10-CM

## 2022-07-04 DIAGNOSIS — K219 Gastro-esophageal reflux disease without esophagitis: Secondary | ICD-10-CM | POA: Diagnosis not present

## 2022-07-04 DIAGNOSIS — Z0001 Encounter for general adult medical examination with abnormal findings: Secondary | ICD-10-CM

## 2022-07-04 DIAGNOSIS — Z1211 Encounter for screening for malignant neoplasm of colon: Secondary | ICD-10-CM

## 2022-07-04 DIAGNOSIS — R7303 Prediabetes: Secondary | ICD-10-CM | POA: Diagnosis not present

## 2022-07-04 DIAGNOSIS — G4701 Insomnia due to medical condition: Secondary | ICD-10-CM

## 2022-07-04 DIAGNOSIS — I10 Essential (primary) hypertension: Secondary | ICD-10-CM

## 2022-07-04 LAB — LIPID PANEL
Cholesterol: 126 mg/dL (ref 0–200)
HDL: 56.7 mg/dL (ref >39.00)
LDL Cholesterol: 52 mg/dL (ref 0–99)
NonHDL: 69.7
Total CHOL/HDL Ratio: 2
Triglycerides: 87 mg/dL (ref 0.0–149.0)
VLDL: 17.4 mg/dL (ref 0.0–40.0)

## 2022-07-04 LAB — HEMOGLOBIN A1C: Hgb A1c MFr Bld: 6.1 % (ref 4.6–6.5)

## 2022-07-04 LAB — VITAMIN B12: Vitamin B-12: 268 pg/mL (ref 211–911)

## 2022-07-04 LAB — VITAMIN D 25 HYDROXY (VIT D DEFICIENCY, FRACTURES): VITD: 33.26 ng/mL (ref 30.00–100.00)

## 2022-07-04 MED ORDER — PANTOPRAZOLE SODIUM 40 MG PO TBEC: 40.0000 mg | DELAYED_RELEASE_TABLET | Freq: Every day | ORAL | 3 refills | Status: DC

## 2022-07-04 MED ORDER — TRAZODONE HCL 50 MG PO TABS: 50.0000 mg | ORAL_TABLET | Freq: Every day | ORAL | 3 refills | Status: DC

## 2022-07-04 NOTE — Assessment & Plan Note (Signed)
Taking trazodone 50 mg daily and refilled. This was removed from list by cardiology for some reason.

## 2022-07-04 NOTE — Patient Instructions (Addendum)
We have refilled the medicine.  We will check the labs and do the colon cancer screening

## 2022-07-04 NOTE — Assessment & Plan Note (Signed)
BP high today but not typically high for her. She did not take meds this morning due to coming in. Continue diltiazem 240 mg daily. Asked her to check home readings and let us know if high. Reviewed recent BMP and no indication for change.

## 2022-07-04 NOTE — Assessment & Plan Note (Signed)
Refilled protonix 40 mg daily. This is helping well will continue.

## 2022-07-04 NOTE — Assessment & Plan Note (Signed)
BMi 14 and complicated by hypertension and hyperlipidemia and A fib. Counseled about diet and exercise.

## 2022-07-04 NOTE — Progress Notes (Signed)
   Subjective:   Patient ID: Amy Williamson, female    DOB: Oct 10, 1950, 71 y.o.   MRN: [id number]  HPI The patient is here for physical.  PMH, [facility], social history reviewed and updated  Review of Systems  Constitutional: Negative.   HENT: Negative.    Eyes: Negative.   Respiratory:  Negative for cough, chest tightness and shortness of breath.   Cardiovascular:  Negative for chest pain, palpitations and leg swelling.  Gastrointestinal:  Negative for abdominal distention, abdominal pain, constipation, diarrhea, nausea and vomiting.  Musculoskeletal:  Positive for back pain.  Skin: Negative.   Neurological: Negative.   Psychiatric/Behavioral:  Positive for sleep disturbance.     Objective:  Physical Exam Constitutional:      Appearance: She is well-developed. She is obese.  HENT:     Head: Normocephalic and atraumatic.  Cardiovascular:     Rate and Rhythm: Normal rate and regular rhythm.  Pulmonary:     Effort: Pulmonary effort is normal. No respiratory distress.     Breath sounds: Normal breath sounds. No wheezing or rales.  Abdominal:     General: Bowel sounds are normal. There is no distension.     Palpations: Abdomen is soft.     Tenderness: There is no abdominal tenderness. There is no rebound.  Musculoskeletal:     Cervical back: Normal range of motion.  Skin:    General: Skin is warm and dry.  Neurological:     Mental Status: She is alert and oriented to person, place, and time.     Coordination: Coordination normal.     Vitals:   07/04/22 0836 07/04/22 0841  BP: (!) 160/60 (!) 160/60  Pulse: 65   Temp: 98.6 F (37 C)   TempSrc: Oral   SpO2: 97%   Weight: 200 lb (90.7 kg)   Height: 5' (1.524 m)     Assessment & Plan:

## 2022-07-04 NOTE — Assessment & Plan Note (Signed)
Checking lipid panel and adjust crestor 20 mg daily for goal LDL <100.

## 2022-07-04 NOTE — Assessment & Plan Note (Signed)
Taking crestor 20 mg daily and checking labs today for improvement. Needs dose adjustment if LDL still >100/

## 2022-07-06 NOTE — Assessment & Plan Note (Signed)
Flu shot up to date. Covid-19 counseled. Pneumonia complete. Shingrix complete. Tetanus due 2027. Cologuard ordered. Mammogram up to date, pap smear aged out and dexa complete. Counseled about sun safety and mole surveillance. Counseled about the dangers of distracted driving. Given 10 year screening recommendations.

## 2022-07-24 NOTE — Progress Notes (Unsigned)
Cardiology Office Note:    Date:  07/25/2022   ID:  Amy Williamson, DOB [birth year], MRN [id number]  PCP:  Hoyt Koch, MD  [facility] Cardiologist:  Rudean Haskell MD [facility] Electrophysiologist:  Vickie Epley, MD   CC: Atrial Fibrillation Follow up  History of Present Illness:    Amy Williamson is a 72 y.o. female with a hx of new atrial fibrillation, with HTN, Aortic Atherosclerosis and HLD, Morbid Obesity, history of dysphagia who sees ([facility]); prior lumpectomy in 06/09/2020 who presented for evaluation 07/08/20. 2022: Had DCCV.  Saw Dr. Radford Pax for OSA. 2023: Had multiple DCCVs and started flecainide (if needed).  AF ablation scheduled for 9/23.  Had Watchman.  Had R groin bleed but otherwise did well.  Had back surgery  Patient notes that she is doing well.   Back pain is improving but still present.  Doing exercises. Hematoma has improved. Had shingles.    No chest pain or pressure .  No SOB/DOE and no PND/Orthopnea.  No weight gain or leg swelling.  No palpitations or syncope.  Hematoma is improving but she still has a small lump.  Much improved and no longer tender.  She has a crown and dental work in March and wanted to clarify ABX for as she does well with amoxicillin but has issues with other therapies.  Past Medical History:  Diagnosis Date   Anticoagulant long-term use    eliquis --- managed by cardiology   Arthritis    BACK AND SHOULDERS   Depressive disorder, not elsewhere classified    Essential hypertension    followed by pcp and cardiology   Frequency of urination    GERD (gastroesophageal reflux disease)    History of dysphagia 2015   s/p egd w/ dilation, pt did not have stricture   History of gastritis    OSA (obstructive sleep apnea)    followed by dr t. turner---  study in epic 09-03-2020 moderate osa ,  pt scheduled for cpap titration 12-08-2020   PAF (paroxysmal atrial fibrillation) [facility])    cardiologist-- dr Jerilynn Mages.  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 LQuentin Ore  Pure hypercholesterolemia     Past Surgical History:  Procedure Laterality Date   ATRIAL FIBRILLATION ABLATION N/A 03/30/2022   Procedure: ATRIAL FIBRILLATION ABLATION;  Surgeon: LVickie Epley MD;  [facility];  Service: Cardiovascular;  Laterality: N/A;   BREAST LUMPECTOMY WITH RADIOACTIVE SEED LOCALIZATION Left 06/09/2020   Procedure: LEFT BREAST LUMPECTOMY WITH RADIOACTIVE SEED LOCALIZATION;  Surgeon: BCoralie Keens MD;  Location: [facility]  Service: General;  Laterality: Left;  LMA   CARDIOVERSION N/A 07/28/2020   Procedure: CARDIOVERSION;  Surgeon: CWerner Lean MD;  [facility];  Service: Cardiovascular;  Laterality: N/A;   CARPAL TUNNEL RELEASE Bilateral right 2004;  left 2006   CATARACT EXTRACTION W/ INTRAOCULAR LENS  IMPLANT, BILATERAL  2016   CESAREAN SECTION  [id number]  BILATERAL TUBAL LIGATION W/ LAST C/S   COLONOSCOPY WITH PROPOFOL  last one 08/ 2013   DILATATION & CURETTAGE/HYSTEROSCOPY WITH  MYOSURE N/A 11/22/2020   Procedure: DILATATION & CURETTAGE/HYSTEROSCOPY WITH MYOSURE;  Surgeon: Megan Salon, MD;  Location: [facility];  Service: Gynecology;  Laterality: N/A;   EAR CYST EXCISION  08/03/2011   patient states this is incorrect was a Baker's cyst popliteal area   ESOPHAGOGASTRODUODENOSCOPY (EGD) WITH ESOPHAGEAL DILATION  08/2013   KNEE ARTHROSCOPY W/ MENISCAL REPAIR Left 08-03-2011  '@[facility]'$    and excision baker's cyst   LEFT ATRIAL APPENDAGE OCCLUSION N/A 06/28/2022    Procedure: LEFT ATRIAL APPENDAGE OCCLUSION;  Surgeon: Vickie Epley, MD;  Location: [facility];  Service: Cardiovascular;  Laterality: N/A;   PULLEY RELEASE RIGHT TRIGGER FINGER Right 01/08/2011   TEE WITHOUT CARDIOVERSION N/A 06/28/2022   Procedure: TRANSESOPHAGEAL ECHOCARDIOGRAM (TEE);  Surgeon: Vickie Epley, MD;  Location: [facility];  Service: Cardiovascular;  Laterality: N/A;   TRIGGER FINGER RELEASE Bilateral left 05/06/14;  right  02-29-2014    Current Medications: Current Meds  Medication Sig   acetaminophen (TYLENOL) 650 MG CR tablet Take 1,300 mg by mouth in the morning and at bedtime.   diltiazem (CARDIZEM CD) 240 MG 24 hr capsule TAKE 1 CAPSULE BY MOUTH EVERY DAY   ELIQUIS 5 MG TABS tablet TAKE 1 TABLET BY MOUTH TWICE A DAY   flecainide (TAMBOCOR) 50 MG tablet Take 0.5 tablets (25 mg total) by mouth 2 (two) times daily as needed (breakthrough afib).   fluticasone (FLONASE) 50 MCG/ACT nasal spray Place 2 sprays into both nostrils daily as needed for allergies or rhinitis.   pantoprazole (PROTONIX) 40 MG tablet Take 1 tablet (40 mg total) by mouth daily.   rosuvastatin (CRESTOR) 20 MG tablet TAKE 1 TABLET BY MOUTH EVERY DAY   traZODone (DESYREL) 50 MG tablet Take 1 tablet (50 mg total) by mouth at bedtime.     Allergies:   Tramadol, Atenolol, Avelox [moxifloxacin hcl in nacl], Azithromycin, Doxycycline, Myrbetriq [mirabegron], Naproxen, and Oxybutynin   Social History   Socioeconomic History   Marital status: Married    Spouse name: Sonia Side   Number of children: 2   Years of education: Not on file   Highest education level: Not on file  Occupational History   Occupation: retired  Tobacco Use   Smoking status: Never   Smokeless tobacco: Never   Tobacco comments:    Never smoke 10/11/21  Vaping Use   Vaping Use: Never used  Substance and Sexual Activity   Alcohol use: No   Drug use: Never   Sexual activity: Not Currently    Birth  control/protection: Surgical, Post-menopausal    Comment: BTL  Other Topics Concern   Not on file  Social History Narrative   Not on file   Social Determinants of Health   Financial Resource Strain: Low Risk  (07/02/2022)   Overall Financial Resource Strain (CARDIA)    Difficulty of Paying Living Expenses: Not hard at all  Food Insecurity: No Food Insecurity (07/02/2022)   Hunger Vital Sign    Worried About Running Out of Food in the Last Year: Never true    [place] in the Last Year: Never true  Transportation Needs: No Transportation Needs (07/02/2022)   PRAPARE - Hydrologist (Medical): No    Lack of Transportation (Non-Medical): No  Physical Activity: Sufficiently Active (07/02/2022)   Exercise Vital Sign    Days of Exercise per Week: 5 days    Minutes of Exercise per Session: 30 min  Stress: No Stress Concern Present (07/02/2022)   [place]    Feeling of Stress : Not at all  Social Connections: Moderately Integrated (07/02/2022)   Social Connection and Isolation Panel [NHANES]    Frequency of Communication with Friends and Family: Twice a week    Frequency of Social Gatherings with Friends and Family: Twice a week    Attends Religious Services: More than 4 times per year    Active Member of [employer] or Organizations: No    Attends Archivist Meetings: Never    Marital Status: Married    Social:  I take care of her nephew, her husband comes to some visits.  Family History: The patient's family history includes Clotting disorder in her father; Heart disease in her father; Heart failure in her mother; Hyperlipidemia in her mother; Hypertension in her mother; Liver cancer in her brother; Liver disease in her maternal aunt; Osteoporosis in her mother. There is no history of Colon cancer. History of coronary artery disease notable for no members. History of heart  failure notable for mother. No history of cardiomyopathies including hypertrophic cardiomyopathy, left ventricular non-compaction, or arrhythmogenic right ventricular cardiomyopathy. History of arrhythmia notable for AF in mother. Denies family history of sudden cardiac death including drowning, car accidents, or unexplained deaths in the family. No history of bicuspid aortic valve or aortic aneurysm or dissection.  ROS:   Please see the history of present illness.     All other systems reviewed and are negative.  EKGs/Labs/Other Studies Reviewed:    The following studies were reviewed today:  EKG:  02/09/22: SR 1st HB LVH 09/13/2020: SR rate 61 WNL 06/30/20:  A fib 70   Cardiac Studies & Procedures     STRESS TESTS  MYOCARDIAL PERFUSION IMAGING 10/19/2021  Narrative   The study is normal. The study is low risk.   No ST deviation was noted.   LV perfusion is normal.   Left ventricular function is normal. Nuclear stress EF: 69 %. The left ventricular ejection fraction is hyperdynamic (>65%). End diastolic cavity size is normal.   Prior study not available for comparison.   ECHOCARDIOGRAM  ECHOCARDIOGRAM COMPLETE 01/10/2022  Narrative ECHOCARDIOGRAM REPORT    Patient Name:   SIYAH MAULT Date of Exam: 01/10/2022 Medical Rec #:  [id number]        Height:       60.0 in Accession #:    [id number]       Weight:       216.0 lb Date of Birth:  [birth year]         BSA:          1.929 m Patient Age:    26 years         BP:           140/74 mmHg Patient Gender: F                HR:           70 bpm. Exam Location:  [address]  Procedure: 2D Echo, Cardiac Doppler and Color Doppler  Indications:    I48.19 Atrial fibrillation  History:        Patient has prior history of Echocardiogram examinations, most recent 08/10/2020. Arrythmias:Atrial Fibrillation; Risk Factors:Morbid obesity and Hypertension.  Sonographer:    Coralyn Helling RDCS Referring Phys: [id number] [place]   1. Left ventricular ejection fraction, by estimation, is 60  to 65%. The left ventricle has normal function. The left ventricle has no regional wall motion abnormalities. Left ventricular diastolic parameters are indeterminate. 2. Right ventricular systolic function is normal. The right ventricular size is normal. There is normal pulmonary artery systolic pressure. 3. Left atrial size was mildly dilated. 4. The mitral valve is normal in structure. Mild mitral valve regurgitation. No evidence of mitral stenosis. 5. The aortic valve is grossly normal. Aortic valve regurgitation is not visualized. No aortic stenosis is present. 6. The inferior vena cava is normal in size with greater than 50% respiratory variability, suggesting right atrial pressure of 3 mmHg.  Comparison(s): No significant change from prior study.  Conclusion(s)/Recommendation(s): Otherwise normal echocardiogram, with minor abnormalities described in the report.  FINDINGS Left Ventricle: Left ventricular ejection fraction, by estimation, is 60 to 65%. The left ventricle has normal function. The left ventricle has no regional wall motion abnormalities. The left ventricular internal cavity size was normal in size. There is no left ventricular hypertrophy. Left ventricular diastolic parameters are indeterminate.  Right Ventricle: The right ventricular size is normal. No increase in right ventricular wall thickness. Right ventricular systolic function is normal. There is normal pulmonary artery systolic pressure. The tricuspid regurgitant velocity is 2.61 m/s, and with an assumed right atrial pressure of 3 mmHg, the estimated right ventricular systolic pressure is 96.2 mmHg.  Left Atrium: Left atrial size was mildly dilated.  Right Atrium: Right atrial size was normal in size.  Pericardium: Trivial pericardial effusion is present.  Mitral Valve: The mitral valve is normal in structure. There is mild  thickening of the mitral valve leaflet(s). There is mild calcification of the mitral valve leaflet(s). Mild mitral valve regurgitation, with eccentric laterally directed jet. No evidence of mitral valve stenosis.  Tricuspid Valve: The tricuspid valve is normal in structure. Tricuspid valve regurgitation is mild . No evidence of tricuspid stenosis.  Aortic Valve: The aortic valve is grossly normal. Aortic valve regurgitation is not visualized. No aortic stenosis is present.  Pulmonic Valve: The pulmonic valve was not well visualized. Pulmonic valve regurgitation is trivial. No evidence of pulmonic stenosis.  Aorta: The aortic root, ascending aorta, aortic arch and descending aorta are all structurally normal, with no evidence of dilitation or obstruction.  Venous: The inferior vena cava is normal in size with greater than 50% respiratory variability, suggesting right atrial pressure of 3 mmHg.  IAS/Shunts: The atrial septum is grossly normal.   LEFT VENTRICLE PLAX 2D LVIDd:         4.70 cm   Diastology LVIDs:         3.00 cm   LV e' medial:    11.70 cm/s LV PW:         1.10 cm   LV E/e' medial:  12.6 LV IVS:        0.90 cm   LV e' lateral:   5.55 cm/s LVOT diam:     1.90 cm   LV E/e' lateral: 26.7 LV SV:         75 LV SV Index:   39 LVOT Area:     2.84 cm   RIGHT VENTRICLE             IVC RV S prime:     14.80 cm/s  IVC diam: 1.70 cm TAPSE (M-mode): 2.9 cm RVSP:           30.2 mmHg  LEFT ATRIUM  Index        RIGHT ATRIUM           Index LA diam:        4.10 cm 2.13 cm/m   RA Pressure: 3.00 mmHg LA Vol (A2C):   51.8 ml 26.86 ml/m  RA Area:     11.20 cm LA Vol (A4C):   63.8 ml 33.08 ml/m  RA Volume:   20.50 ml  10.63 ml/m LA Biplane Vol: 59.2 ml 30.69 ml/m AORTIC VALVE LVOT Vmax:   111.00 cm/s LVOT Vmean:  73.700 cm/s LVOT VTI:    0.265 m  AORTA Ao Root diam: 3.10 cm Ao Asc diam:  3.40 cm  MITRAL VALVE                TRICUSPID VALVE MV Area (PHT): 3.23  cm     TR Peak grad:   27.2 mmHg MV Decel Time: 235 msec     TR Vmax:        261.00 cm/s MV E velocity: 148.00 cm/s  Estimated RAP:  3.00 mmHg MV A velocity: 136.00 cm/s  RVSP:           30.2 mmHg MV E/A ratio:  1.09 SHUNTS Systemic VTI:  0.26 m Systemic Diam: 1.90 cm  Buford Dresser MD Electronically signed by Buford Dresser MD Signature Date/Time: 01/10/2022/2:39:07 PM    Final   TEE  ECHO TEE 06/28/2022  Narrative TRANSESOPHOGEAL ECHO REPORT    Patient Name:   HERMILA MILLIS Date of Exam: 06/28/2022 Medical Rec #:  [id number]        Height:       60.0 in Accession #:    [id number]       Weight:       200.0 lb Date of Birth:  14-May-1951         BSA:          1.867 m Patient Age:    36 years         BP:           136/51 mmHg Patient Gender: F                HR:           76 bpm. Exam Location:  Inpatient  Procedure: Transesophageal Echo, Cardiac Doppler, Color Doppler and 3D Echo  Indications:     Persistent atrial fibrillation (HCC) [I94.85 (ICD-10-CM)]  History:         Patient has prior history of Echocardiogram examinations, most recent 01/10/2022. Risk Factors:Hypertension, Sleep Apnea and Dyslipidemia. 52m Watchman implanted on 06/28/2022. GERD.  Sonographer:     TDarlina SicilianRDCS Referring Phys:  [id number] EpleyDiagnosing Phys: MRudean HaskellMD  PROCEDURE: After discussion of the risks and benefits of a TEE, an informed consent was obtained from the patient. The patient was intubated. TEE procedure time was 39 minutes. The transesophogeal probe was passed without difficulty through the esophogus of the patient. Imaged were obtained with the patient in a supine position. Sedation performed by different physician. The patient was monitored while under deep sedation. Anesthestetic sedation was provided intravenously by Anesthesiology: '130mg'$  of Propofol, '80mg'$  of Lidocaine. Supplementary images were obtained from transthoracic windows  as indicated to answer the clinical question. The patient developed no complications during the procedure.  IMPRESSIONS   1. Interventional TEE for Watchman FLX procedure. 2. Prior to procedure, there was a patent left atrial appendage with a windsock morphology. Maximal diameter 20  cm (ostial deployment) with suitable depth for device. 3. Mid- mid stick with no complications. 4. Placement of 27 mm Watchman FLX device. No leak. No Mitral shoulder. Average compression ~ 23%. 5. Left ventricular ejection fraction, by estimation, is 60 to 65%. The left ventricle has normal function. 6. Right ventricular systolic function is normal. The right ventricular size is normal. 7. Left atrial size was mildly dilated. No left atrial/left atrial appendage thrombus was detected. 8. The mitral valve is normal in structure. Mild mitral valve regurgitation. 9. The aortic valve is normal in structure. Aortic valve regurgitation is not visualized. No aortic stenosis is present. 10. Evidence of atrial level shunting detected by color flow Doppler after transeptal puncture, with all left to right flow. 11. Trivial apical pericardial effusion is unchanged from prior.  FINDINGS Left Ventricle: Left ventricular ejection fraction, by estimation, is 60 to 65%. The left ventricle has normal function. The left ventricular internal cavity size was normal in size.  Right Ventricle: The right ventricular size is normal. No increase in right ventricular wall thickness. Right ventricular systolic function is normal.  Left Atrium: Left atrial size was mildly dilated. No left atrial/left atrial appendage thrombus was detected.  Right Atrium: Right atrial size was normal in size.  Pericardium: Trivial pericardial effusion is present. The pericardial effusion is circumferential and surrounding the apex.  Mitral Valve: The mitral valve is normal in structure. Mild mitral valve regurgitation.  Tricuspid Valve: The tricuspid  valve is normal in structure. Tricuspid valve regurgitation is mild . No evidence of tricuspid stenosis.  Aortic Valve: The aortic valve is normal in structure. Aortic valve regurgitation is not visualized. No aortic stenosis is present.  Pulmonic Valve: The pulmonic valve was normal in structure. Pulmonic valve regurgitation is trivial. No evidence of pulmonic stenosis.  Aorta: The aortic root is normal in size and structure.  IAS/Shunts: Evidence of atrial level shunting detected by color flow Doppler.   TRICUSPID VALVE TR Peak grad:   24.2 mmHg TR Vmax:        246.00 cm/s  Rudean Haskell MD Electronically signed by Rudean Haskell MD Signature Date/Time: 06/28/2022/11:03:59 AM    Final             NonCardiac CT: Date: 09/23/2013 Results: No coronary calcification; Aortic Arch Calcifications  Recent Labs: 09/26/2021: B Natriuretic Peptide 200.5 01/02/2022: Magnesium 2.0; TSH 1.98 02/26/2022: ALT 12 06/18/2022: BUN 15; Creatinine, Ser 0.76; Hemoglobin 12.2; Platelets 230; Potassium 4.3; Sodium 143  Recent Lipid Panel    Component Value Date/Time   CHOL 126 07/04/2022 0912   CHOL 155 05/09/2021 0732   TRIG 87.0 07/04/2022 0912   TRIG 54 06/25/2006 0725   HDL 56.70 07/04/2022 0912   HDL 62 05/09/2021 0732   CHOLHDL 2 07/04/2022 0912   VLDL 17.4 07/04/2022 0912   LDLCALC 52 07/04/2022 0912   LDLCALC 72 05/09/2021 0732   LDLDIRECT 142.3 03/16/2013 0732    Risk Assessment/Calculations:     CHA2DS2-VASc Score = 3  This indicates a 3.2% annual risk of stroke. The patient's score is based upon: CHF History: 0 HTN History: 1 Diabetes History: 0 Stroke History: 0 Vascular Disease History: 0 Age Score: 1 Gender Score: 1      Physical Exam:    VS:  BP 128/64   Pulse 71   Ht 5' (1.524 m)   Wt 203 lb 6.4 oz (92.3 kg)   LMP 07/23/2004   SpO2 98%   BMI 39.72 kg/m  Wt Readings from Last 3 Encounters:  07/25/22 203 lb 6.4 oz (92.3 kg)  07/04/22  200 lb (90.7 kg)  07/02/22 200 lb (90.7 kg)    Gen: no distress, morbid obesity   Neck: No JVD Cardiac: No Rubs or Gallops, no murmur, RRR +2 radial pulses Respiratory: Clear to auscultation bilaterally, normal effort, normal  respiratory rate GI: Soft, nontender, non-distended  MS: No  edema;  moves all extremities Integument: Small hematoma R fem with no pain or bruit Neuro:  At time of evaluation, alert and oriented to person/place/time/situation  Psych: Normal affect, patient feels OK   ASSESSMENT:    1. Presence of Watchman left atrial appendage closure device   2. Paroxysmal atrial fibrillation (HCC)   3. OSA (obstructive sleep apnea)   4. Morbid obesity (HCC)     PLAN:    Persistent Atrial Fibrillation s/p 2023 ablation OSA - with issues with mouthguard Morbid Obesity HTN- BP at goal - CHADSVASC=3, HASBLED 3 pending Watchman CT - Continue anticoagulation with Eliquis; has CT scan 09/05/21 - continue diltiazem and PRN flecainide - will clarify with EP team whether or not she needs antibiotics after the device endothelialization; it would be amoxicillin 1 hr prior dental procedure given her other allergies  Aortic Atherosclerosis and HLD - LDL goal < 70  One year f/u   Medication Adjustments/Labs and Tests Ordered: Current medicines are reviewed at length with the patient today.  Concerns regarding medicines are outlined above.  No orders of the defined types were placed in this encounter.  No orders of the defined types were placed in this encounter.   Patient Instructions  Medication Instructions:  Your physician recommends that you continue on your current medications as directed. Please refer to the Current Medication list given to you today.  *If you need a refill on your cardiac medications before your next appointment, please call your pharmacy*   Lab Work: NONE If you have labs (blood work) drawn today and your tests are completely normal, you will  receive your results only by: [place] (if you have MyChart) OR A paper copy in the mail If you have any lab test that is abnormal or we need to change your treatment, we will call you to review the results.   Testing/Procedures: NONE   Follow-Up: At [facility], you and your health needs are our priority.  As part of our continuing mission to provide you with exceptional heart care, we have created designated Provider Care Teams.  These Care Teams include your primary Cardiologist (physician) and Advanced Practice Providers (APPs -  Physician Assistants and Nurse Practitioners) who all work together to provide you with the care you need, when you need it.   Your next appointment:   1 year(s)  The format for your next appointment:   In Person  Provider:   Rudean Haskell, MD    Other Instructions   Important Information About Sugar         Signed, Werner Lean, MD  07/25/2022 8:59 AM    [place]

## 2022-07-25 ENCOUNTER — Encounter: Payer: Self-pay | Admitting: Internal Medicine

## 2022-07-25 ENCOUNTER — Telehealth: Payer: Self-pay

## 2022-07-25 ENCOUNTER — Ambulatory Visit: Payer: Medicare Other | Attending: Internal Medicine | Admitting: Internal Medicine

## 2022-07-25 VITALS — BP 128/64 | HR 71 | Ht 60.0 in | Wt 203.4 lb

## 2022-07-25 DIAGNOSIS — Z95818 Presence of other cardiac implants and grafts: Secondary | ICD-10-CM

## 2022-07-25 DIAGNOSIS — I480 Paroxysmal atrial fibrillation: Secondary | ICD-10-CM

## 2022-07-25 DIAGNOSIS — G4733 Obstructive sleep apnea (adult) (pediatric): Secondary | ICD-10-CM | POA: Diagnosis not present

## 2022-07-25 LAB — COLOGUARD
COLOGUARD: NEGATIVE
Cologuard: NEGATIVE

## 2022-07-25 MED ORDER — AMOXICILLIN 500 MG PO TABS: 2000.0000 mg | ORAL_TABLET | ORAL | 0 refills | Status: DC

## 2022-07-25 NOTE — Patient Instructions (Signed)
Medication Instructions:  Your physician recommends that you continue on your current medications as directed. Please refer to the Current Medication list given to you today.  *If you need a refill on your cardiac medications before your next appointment, please call your pharmacy*   Lab Work: NONE If you have labs (blood work) drawn today and your tests are completely normal, you will receive your results only by: [place] (if you have MyChart) OR A paper copy in the mail If you have any lab test that is abnormal or we need to change your treatment, we will call you to review the results.   Testing/Procedures: NONE   Follow-Up: At [facility], you and your health needs are our priority.  As part of our continuing mission to provide you with exceptional heart care, we have created designated Provider Care Teams.  These Care Teams include your primary Cardiologist (physician) and Advanced Practice Providers (APPs -  Physician Assistants and Nurse Practitioners) who all work together to provide you with the care you need, when you need it.   Your next appointment:   1 year(s)  The format for your next appointment:   In Person  Provider:   Rudean Haskell, MD    Other Instructions   Important Information About Sugar

## 2022-07-25 NOTE — Addendum Note (Signed)
Addended by: Janan Halter F on: 07/25/2022 07:15 PM   Modules accepted: Orders

## 2022-07-25 NOTE — Telephone Encounter (Signed)
The patient had an appointment with Dr. Gasper Sells this AM and reported she has some dental work coming up in March 2024. Called the patient and confirmed she will need to have SBE prescribed to take until 6 months from [place] implant (until 12/28/2022).  Confirmed she has no issues with PCNs. Informed her amoxicillin 2000 mg will be called in for her to take 1 hour prior to all dental visits until June 7. She was grateful for assistance.

## 2022-07-30 ENCOUNTER — Ambulatory Visit: Payer: Medicare Other

## 2022-08-07 NOTE — Addendum Note (Signed)
Addended by: Janan Halter F on: 08/07/2022 04:43 PM   Modules accepted: Orders

## 2022-08-09 ENCOUNTER — Telehealth: Payer: Self-pay

## 2022-08-09 MED ORDER — CLOPIDOGREL BISULFATE 75 MG PO TABS: 75.0000 mg | ORAL_TABLET | Freq: Every day | ORAL | 1 refills | Status: DC

## 2022-08-09 NOTE — Telephone Encounter (Signed)
Amy Williamson underwent successful LAAO closure 06/28/2022. Per Dr. Mardene Speak op report: "Post Implant Anticoagulation Strategy: Continue Eliquis '5mg'$  PO BID x 45 days after Watchman implant and then transition to Plavix '75mg'$  PO daily to complete 6 months of post implant therapy. CT scan planned 60 days after implant to assess Watchman position."  Instructed the patient to STOP ELIQUIS after Sunday, 08/12/22 PM dose and instructed her to START PLAVIX 75 mg daily 08/13/22. Confirmed CT appointment 2/14. Will send CT instructions via MyChart. She was grateful for call and agreed with plan.

## 2022-09-03 ENCOUNTER — Telehealth (HOSPITAL_COMMUNITY): Payer: Self-pay | Admitting: Emergency Medicine

## 2022-09-03 NOTE — Telephone Encounter (Signed)
Reaching out to patient to offer assistance regarding upcoming cardiac imaging study; pt verbalizes understanding of appt date/time, parking situation and where to check in, pre-test NPO status and medications ordered, and verified current allergies; name and call back number provided for further questions should they arise Marchia Bond RN Navigator Cardiac Imaging [facility] [phone] office [phone] cell  Arrival 0930 WC entrance Denies iv issues Daily meds Aware contrast

## 2022-09-05 ENCOUNTER — Ambulatory Visit (HOSPITAL_COMMUNITY)
Admission: RE | Admit: 2022-09-05 | Discharge: 2022-09-05 | Disposition: A | Discharge status: Home / Self Care | Payer: Medicare Other | Source: Ambulatory Visit | Attending: Cardiology | Admitting: Cardiology

## 2022-09-05 DIAGNOSIS — I480 Paroxysmal atrial fibrillation: Secondary | ICD-10-CM | POA: Insufficient documentation

## 2022-09-05 DIAGNOSIS — Z95818 Presence of other cardiac implants and grafts: Secondary | ICD-10-CM | POA: Insufficient documentation

## 2022-09-05 MED ORDER — IOHEXOL 350 MG/ML SOLN
100.0000 mL | Freq: Once | INTRAVENOUS | Status: AC | PRN
  Administered 2022-09-05: 100 mL via INTRAVENOUS

## 2022-09-06 ENCOUNTER — Ambulatory Visit
Admission: EM | Admit: 2022-09-06 | Discharge: 2022-09-06 | Disposition: A | Discharge status: Home / Self Care | Payer: Medicare Other | Attending: Internal Medicine | Admitting: Internal Medicine

## 2022-09-06 ENCOUNTER — Other Ambulatory Visit: Payer: Self-pay

## 2022-09-06 ENCOUNTER — Encounter: Payer: Self-pay | Admitting: Emergency Medicine

## 2022-09-06 ENCOUNTER — Ambulatory Visit (INDEPENDENT_AMBULATORY_CARE_PROVIDER_SITE_OTHER): Payer: Medicare Other

## 2022-09-06 DIAGNOSIS — R062 Wheezing: Secondary | ICD-10-CM

## 2022-09-06 DIAGNOSIS — J069 Acute upper respiratory infection, unspecified: Secondary | ICD-10-CM | POA: Insufficient documentation

## 2022-09-06 DIAGNOSIS — R059 Cough, unspecified: Secondary | ICD-10-CM

## 2022-09-06 DIAGNOSIS — I480 Paroxysmal atrial fibrillation: Secondary | ICD-10-CM | POA: Insufficient documentation

## 2022-09-06 DIAGNOSIS — Z1152 Encounter for screening for COVID-19: Secondary | ICD-10-CM | POA: Insufficient documentation

## 2022-09-06 MED ORDER — ALBUTEROL SULFATE HFA 108 (90 BASE) MCG/ACT IN AERS: 1.0000 | INHALATION_SPRAY | Freq: Four times a day (QID) | RESPIRATORY_TRACT | 0 refills | Status: DC | PRN

## 2022-09-06 MED ORDER — BENZONATATE 100 MG PO CAPS: 100.0000 mg | ORAL_CAPSULE | Freq: Three times a day (TID) | ORAL | 0 refills | Status: DC | PRN

## 2022-09-06 MED ORDER — ALBUTEROL SULFATE (2.5 MG/3ML) 0.083% IN NEBU
2.5000 mg | INHALATION_SOLUTION | Freq: Once | RESPIRATORY_TRACT | Status: AC
  Administered 2022-09-06: 2.5 mg via RESPIRATORY_TRACT

## 2022-09-06 NOTE — ED Provider Notes (Signed)
[facility]    CSN: [id number] Arrival date & time: 09/06/22  0808      History   Chief Complaint Chief Complaint  Patient presents with   Cough    HPI Amy Williamson is a 72 y.o. female.   Patient presents with 4-day history of fatigue, headache, nasal congestion, productive cough, wheezing.  Reports her husband has had similar symptoms.  She states that he tested negative for COVID and flu.  Patient reports Tmax of 101 at home.  States coughing and wheezing typically worsens at night when lying flat.  Has taken Tylenol and Flonase for symptoms.  Denies history of asthma or COPD and patient does not smoke cigarettes.  Patient does have history of atrial fibrillation where she had a Watchman device placed in December 2023. Denies chest pain or shortness of breath.    Cough   Past Medical History:  Diagnosis Date   Anticoagulant long-term use    eliquis --- managed by cardiology   Arthritis    BACK AND SHOULDERS   Depressive disorder, not elsewhere classified    Essential hypertension    followed by pcp and cardiology   Frequency of urination    GERD (gastroesophageal reflux disease)    History of dysphagia 2015   s/p egd w/ dilation, pt did not have stricture   History of gastritis    OSA (obstructive sleep apnea)    followed by dr t. turner---  study in epic 09-03-2020 moderate osa ,  pt scheduled for cpap titration 12-08-2020   PAF (paroxysmal atrial fibrillation) [facility])    cardiologist-- 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   Atrial fibrillation ([facility] 06/28/2022   Degenerative spondylolisthesis 02/19/2022   Secondary hypercoagulable state ([facility] 10/11/2021   OSA (obstructive sleep apnea) 09/13/2020   Aortic atherosclerosis ([facility] 06/30/2020   Insomnia 09/19/2018   Encounter for general adult medical examination with abnormal findings 04/02/2016   Other dysphagia 09/21/2013   Morbid obesity ([facility] 03/24/2013   DJD (degenerative joint disease) 03/24/2013   GERD (gastroesophageal reflux disease) 02/27/2012   Venous insufficiency 03/04/2010   Hyperlipidemia 01/21/2008   Essential hypertension 01/21/2008    Past Surgical History:  Procedure Laterality Date   ATRIAL FIBRILLATION ABLATION N/A 03/30/2022   Procedure: ATRIAL FIBRILLATION ABLATION;  Surgeon: LVickie Epley MD;  [facility];  Service: Cardiovascular;  Laterality: N/A;   BREAST LUMPECTOMY WITH RADIOACTIVE SEED LOCALIZATION Left 06/09/2020   Procedure: LEFT BREAST LUMPECTOMY WITH RADIOACTIVE SEED LOCALIZATION;  Surgeon: BCoralie Keens MD;  Location: [facility]  Service: General;  Laterality: Left;  LMA   CARDIOVERSION N/A 07/28/2020   Procedure: CARDIOVERSION;  Surgeon: CWerner Lean MD;  [facility];  Service: Cardiovascular;  Laterality: N/A;   CARPAL TUNNEL RELEASE Bilateral right 2004;  left 2006   CATARACT EXTRACTION W/ INTRAOCULAR LENS  IMPLANT, BILATERAL  2016   CESAREAN SECTION  [id number]   BILATERAL TUBAL LIGATION W/ LAST C/S   COLONOSCOPY WITH PROPOFOL  last one 08/ 2013   DILATATION & CURETTAGE/HYSTEROSCOPY WITH MYOSURE N/A 11/22/2020   Procedure: DILATATION & CURETTAGE/HYSTEROSCOPY WITH MYOSURE;  Surgeon: Megan Salon, MD;  Location: [facility];  Service: Gynecology;  Laterality: N/A;   EAR CYST EXCISION  08/03/2011   patient states  this is incorrect was a Baker's cyst popliteal area   ESOPHAGOGASTRODUODENOSCOPY (EGD) WITH ESOPHAGEAL DILATION  08/2013   KNEE ARTHROSCOPY W/ MENISCAL REPAIR Left 08-03-2011  @[facility]$    and excision baker's cyst   LEFT ATRIAL APPENDAGE OCCLUSION N/A 06/28/2022   Procedure: LEFT ATRIAL APPENDAGE OCCLUSION;  Surgeon: Vickie Epley, MD;  Location: [facility];  Service: Cardiovascular;  Laterality: N/A;   PULLEY RELEASE RIGHT TRIGGER FINGER Right 01/08/2011   TEE WITHOUT CARDIOVERSION N/A 06/28/2022   Procedure: TRANSESOPHAGEAL ECHOCARDIOGRAM (TEE);  Surgeon: Vickie Epley, MD;  Location: [place] [facility];  Service: Cardiovascular;  Laterality: N/A;   TRIGGER FINGER RELEASE Bilateral left 05/06/14;  right  02-29-2014    OB History     Gravida  2   Para  2   Term      Preterm      AB      Living  2      SAB      IAB      Ectopic      Multiple      Live Births               Home Medications    Prior to Admission medications   Medication Sig Start Date End Date Taking? Authorizing Provider  albuterol (VENTOLIN HFA) 108 (90 Base) MCG/ACT inhaler Inhale 1-2 puffs into the lungs every 6 (six) hours as needed for wheezing or shortness of breath. 09/06/22  Yes Gavyn Zoss, Hildred Alamin E, FNP  benzonatate (TESSALON) 100 MG capsule Take 1 capsule (100 mg total) by mouth every 8 (eight) hours as needed for cough. 09/06/22  Yes Delynn Pursley, Michele Rockers, FNP  NON FORMULARY Vitamin D 3 Vitamin B 12   Yes [provider]  acetaminophen (TYLENOL) 650 MG CR tablet Take 1,300 mg by mouth in the morning and at bedtime.    [provider]  amoxicillin (AMOXIL) 500 MG tablet Take 4 tablets (2,000 mg total) by mouth as directed. Take 4 tablets (2,000 mg) 1 hour prior to all dental visits until 12/28/2022. Patient not taking: Reported on 09/06/2022 07/25/22   Vickie Epley, MD  clopidogrel (PLAVIX) 75 MG tablet Take 1 tablet (75 mg total) by mouth daily. 08/09/22   Tommie Raymond, NP  diltiazem (CARDIZEM CD) 240 MG 24 hr capsule TAKE 1 CAPSULE BY MOUTH EVERY DAY 11/30/21   Rudean Haskell A, MD  flecainide (TAMBOCOR) 50 MG tablet Take 0.5 tablets (25 mg total) by mouth 2 (two) times daily as needed (breakthrough afib). 12/12/21   Fenton, Clint R, PA  fluticasone (FLONASE) 50 MCG/ACT nasal spray Place 2 sprays into both nostrils daily as needed for allergies or rhinitis.    [provider]  pantoprazole (PROTONIX) 40 MG tablet Take 1 tablet (40 mg total) by mouth daily. 07/04/22   Hoyt Koch, MD  rosuvastatin (CRESTOR) 20 MG tablet TAKE 1 TABLET BY MOUTH EVERY DAY 02/05/22   Rudean Haskell A, MD  traZODone (DESYREL) 50 MG tablet Take 1 tablet (50 mg total) by mouth at bedtime. 07/04/22   Pricilla Holm  A, MD    Family History Family History  Problem Relation Age of Onset   Heart failure Mother    Osteoporosis Mother    Hyperlipidemia Mother    Hypertension Mother    Heart disease Father    Clotting disorder Father    Liver cancer Brother    Liver disease Maternal Aunt        x2   Colon cancer Neg Hx     Social History Social History   Tobacco Use   Smoking status: Never   Smokeless tobacco: Never   Tobacco comments:    Never smoke 10/11/21  Vaping Use   Vaping Use: Never used  Substance Use Topics   Alcohol use: No   Drug use: Never     Allergies   Tramadol, Atenolol, Avelox [moxifloxacin hcl in nacl], Azithromycin, Doxycycline, Myrbetriq [mirabegron], Naproxen, and Oxybutynin   Review of Systems Review of Systems Per HPI  Physical Exam Triage Vital Signs ED Triage Vitals  Enc Vitals Group     BP 09/06/22 0820 138/77     Pulse Rate 09/06/22 0820 89     Resp 09/06/22 0820 20     Temp 09/06/22 0820 98.8 F (37.1 C)     Temp Source 09/06/22 0820 Oral     SpO2 09/06/22 0820 95 %     Weight --      Height --      Head Circumference --      Peak Flow --      Pain Score 09/06/22 0817 4     Pain Loc  --      Pain Edu? --      Excl. in [place]? --    No data found.  Updated Vital Signs BP 138/77 (BP Location: Left Arm) Comment (BP Location): large cuff  Pulse 89   Temp 98.8 F (37.1 C) (Oral)   Resp 20   LMP 07/23/2004   SpO2 95%   Visual Acuity Right Eye Distance:   Left Eye Distance:   Bilateral Distance:    Right Eye Near:   Left Eye Near:    Bilateral Near:     Physical Exam Constitutional:      General: She is not in acute distress.    Appearance: Normal appearance. She is not toxic-appearing or diaphoretic.  HENT:     Head: Normocephalic and atraumatic.     Right Ear: Tympanic membrane and ear canal normal.     Left Ear: Tympanic membrane and ear canal normal.     Nose: Congestion present.     Mouth/Throat:     Mouth: Mucous membranes are moist.     Pharynx: No posterior oropharyngeal erythema.  Eyes:     Extraocular Movements: Extraocular movements intact.     Conjunctiva/sclera: Conjunctivae normal.     Pupils: Pupils are equal, round, and reactive to light.  Cardiovascular:     Rate and Rhythm: Normal rate and regular rhythm.     Pulses: Normal pulses.     Heart sounds: Normal heart sounds.  Pulmonary:     Effort: Pulmonary effort is normal. No respiratory distress.     Breath sounds: No stridor. Wheezing present. No rhonchi or rales.     Comments: Mild wheezing bilaterally to auscultation. Abdominal:     General: Abdomen is flat. Bowel sounds are normal.     Palpations: Abdomen is soft.  Musculoskeletal:        General: Normal range of motion.  Cervical back: Normal range of motion.  Skin:    General: Skin is warm and dry.  Neurological:     General: No focal deficit present.     Mental Status: She is alert and oriented to person, place, and time. Mental status is at baseline.  Psychiatric:        Mood and Affect: Mood normal.        Behavior: Behavior normal.      UC Treatments / Results  Labs (all labs ordered are listed, but only  abnormal results are displayed) Labs Reviewed  SARS CORONAVIRUS 2 (TAT 6-24 HRS)    EKG   Radiology DG Chest 2 View  Result Date: 09/06/2022 CLINICAL DATA:  Cough. EXAM: CHEST - 2 VIEW COMPARISON:  06/28/2022 cardiac CT 09/05/2022 FINDINGS: Watchman left atrial appendage occlusion device is present. Chronic linear densities in the region of the lingula most likely represents scarring. No focal airspace disease or pulmonary edema. Heart size is stable and within normal limits. Stable linear density along the anterior right fifth rib most likely represents bone sclerosis. No pleural effusions. No acute bone abnormality. IMPRESSION: 1. No acute cardiopulmonary disease. 2. Watchman left atrial appendage occlusion device. Electronically Signed   By: Markus Daft M.D.   On: 09/06/2022 09:14   CT CARDIAC MORPH/PULM VEIN W/CM&W/O CA SCORE  Result Date: 09/05/2022 CLINICAL DATA:  Post Watchman EXAM: Cardiac Gated CTA TECHNIQUE: The patient was scanned on a Siemens Force [id number] slice scanner. Gantry rotation speed was 250 msec with a temporal resolution of 66 msec. A prospective scan was triggered in the ascending thoracic aorta at 140 HU's Data sets were reconstructed with full mA between 35% and 75% of the R-R interval Images were reviewed using VRT, MIP and MPR modes. Double oblique images were used to measure the PV diameter and areas. The patient received 80 cc of contrast at 5 cc/sec CONTRAST:  Isovue 370 total 80 cc COMPARISON:  03/26/22 FINDINGS: Moderate bi atrial enlargement. No residual ASD/PFO post trans septal puncture No pericardial effusion Normal ascending thoracic aorta 3.7 cm 27 mm Watchman FLX device well positioned in appendage. No leak/gaps Well endothelialized Average compression 15% RUPV: 26.3 x 19.8 mm, area 3.76 cm2 RLPV: 24.2 x 19.6 mm, area 3.68 cm2 LUPV: 23.3 x 19.0 mm, area 3.49 cm2 LLPV: 16.1 x 10.3 mm, area 1.30 cm2 IMPRESSION: 1. Well positioned 27 mm Watchman FLX device with no leaks,  well endothelialized and no large shoulders Average compression 15% 2.  Moderate bi atrial enlargement 3.  No residual PFO/ASD post trans septal puncture 4.  No pericardial effusion 5.  Normal PV anatomy see measurements above 6.  Normal ascending thoracic aorta 3.7 cm Electronically Signed   By: Jenkins Rouge M.D.   On: 09/05/2022 11:43    Procedures Procedures (including critical care time)  Medications Ordered in UC Medications  albuterol (PROVENTIL) (2.5 MG/3ML) 0.083% nebulizer solution 2.5 mg (2.5 mg Nebulization Given 09/06/22 0840)    Initial Impression / Assessment and Plan / UC Course  I have reviewed the triage vital signs and the nursing notes.  Pertinent labs & imaging results that were available during my care of the patient were reviewed by me and considered in my medical decision making (see chart for details).     Patient presents with symptoms likely from a viral upper respiratory infection. Do not suspect underlying cardiopulmonary process. Symptoms seem unlikely related to ACS, CHF or COPD exacerbations, pneumonia, pneumothorax. Patient is nontoxic appearing and  not in need of emergent medical intervention.  COVID test pending.  Chest x-ray was negative for any acute cardiopulmonary process.  It did show right fifth rib bone sclerosis which is most likely incidental finding but not noted on previous imaging.  Therefore, patient was advised of this and advised to follow-up with PCP.  Patient did have wheezing on exam so nebulizer treatment was administered in urgent care with improvement in lung sounds and patient stating that she felt better.  She does have atrial fibrillation but heart rate and rhythm appear to stay normal after nebulizer treatment.  She reports that she is not able to take prednisone as this will cause her atrial fibrillation to flareup.  Therefore, will avoid prednisone.  I do think benefits outweigh risks with albuterol administration given wheezing noted  on exam so will prescribe albuterol inhaler for patient to take as needed.  Patient has an Visual merchandiser which monitors her heart rate so encouraged patient to monitor heart rate while using albuterol as needed.  Cough medication also prescribed for patient.  Patient was advised to follow-up if any symptoms persist or worsen.  Patient verbalized understanding and was agreeable with plan.  Final Clinical Impressions(s) / UC Diagnoses   Final diagnoses:  Viral upper respiratory tract infection with cough     Discharge Instructions      It appears that you have a viral illness should run its course and self resolve with the help of symptomatic treatment.  I have prescribed you albuterol inhaler and cough medication.  Please remember to monitor your heart rate very closely while taking albuterol inhaler.  Continue Flonase as well.  COVID test is pending.  Will call if it is positive.  Please follow-up if any symptoms persist or worsen.     ED Prescriptions     Medication Sig Dispense Auth. Provider   albuterol (VENTOLIN HFA) 108 (90 Base) MCG/ACT inhaler Inhale 1-2 puffs into the lungs every 6 (six) hours as needed for wheezing or shortness of breath. 1 each [place], [place] E, [place]   benzonatate (TESSALON) 100 MG capsule Take 1 capsule (100 mg total) by mouth every 8 (eight) hours as needed for cough. 21 capsule [place], Michele Rockers, [place]      PDMP not reviewed this encounter.   Teodora Medici, [place] 09/06/22 1009

## 2022-09-06 NOTE — Discharge Instructions (Signed)
It appears that you have a viral illness should run its course and self resolve with the help of symptomatic treatment.  I have prescribed you albuterol inhaler and cough medication.  Please remember to monitor your heart rate very closely while taking albuterol inhaler.  Continue Flonase as well.  COVID test is pending.  Will call if it is positive.  Please follow-up if any symptoms persist or worsen.

## 2022-09-06 NOTE — ED Triage Notes (Signed)
Started feeling bad on Monday.  Patient reports coughing and wheezing when lying down.  Patient reports runny nose, yellow phlegm with cough, hoarseness this morning.    Spouse sick last week, tested negative for covid and flu  Has taken tylenol and Flonase nasal spray

## 2022-09-07 LAB — SARS CORONAVIRUS 2 (TAT 6-24 HRS): SARS Coronavirus 2: NEGATIVE

## 2022-09-17 ENCOUNTER — Other Ambulatory Visit: Payer: Self-pay | Admitting: Obstetrics & Gynecology

## 2022-09-17 DIAGNOSIS — Z1231 Encounter for screening mammogram for malignant neoplasm of breast: Secondary | ICD-10-CM

## 2022-09-25 ENCOUNTER — Encounter: Payer: Self-pay | Admitting: Internal Medicine

## 2022-09-25 ENCOUNTER — Ambulatory Visit: Payer: Medicare Other | Admitting: Internal Medicine

## 2022-09-25 VITALS — BP 160/80 | HR 77 | Temp 98.5°F | Ht 60.0 in | Wt 200.2 lb

## 2022-09-25 DIAGNOSIS — M898X9 Other specified disorders of bone, unspecified site: Secondary | ICD-10-CM

## 2022-09-25 DIAGNOSIS — R058 Other specified cough: Secondary | ICD-10-CM

## 2022-09-25 MED ORDER — BENZONATATE 100 MG PO CAPS: 100.0000 mg | ORAL_CAPSULE | Freq: Three times a day (TID) | ORAL | 0 refills | Status: DC | PRN

## 2022-09-25 NOTE — Patient Instructions (Signed)
We have refilled the tessalon perles.

## 2022-09-25 NOTE — Assessment & Plan Note (Signed)
Rx tessalon perles to use as needed up to TID for cough. Cough is improving gradually and may take several more weeks to resolve. Call back or return for worsening, fevers, SOB, wheezing.

## 2022-09-25 NOTE — Progress Notes (Signed)
   Subjective:   Patient ID: [name], female    DOB: [birth year], 72 y.o.   MRN: [id number]  HPI The patient is a 72 YO female coming in for urgent care f/u (seen for viral URI given albuterol inhaler and cough medicine).   Review of Systems  Constitutional: Negative.   HENT: Negative.    Eyes: Negative.   Respiratory:  Positive for cough. Negative for chest tightness and shortness of breath.   Cardiovascular:  Negative for chest pain, palpitations and leg swelling.  Gastrointestinal:  Negative for abdominal distention, abdominal pain, constipation, diarrhea, nausea and vomiting.  Musculoskeletal: Negative.   Skin: Negative.   Neurological: Negative.   Psychiatric/Behavioral: Negative.      Objective:  Physical Exam Constitutional:      Appearance: She is well-developed. She is obese.  HENT:     Head: Normocephalic and atraumatic.  Cardiovascular:     Rate and Rhythm: Normal rate and regular rhythm.  Pulmonary:     Effort: Pulmonary effort is normal. No respiratory distress.     Breath sounds: Normal breath sounds. No wheezing or rales.  Abdominal:     General: Bowel sounds are normal. There is no distension.     Palpations: Abdomen is soft.     Tenderness: There is no abdominal tenderness. There is no rebound.  Musculoskeletal:     Cervical back: Normal range of motion.  Skin:    General: Skin is warm and dry.  Neurological:     Mental Status: She is alert and oriented to person, place, and time.     Coordination: Coordination normal.     Vitals:   09/25/22 0931  BP: (!) 160/80  Pulse: 77  Temp: 98.5 F (36.9 C)  TempSrc: Oral  SpO2: 98%  Weight: 200 lb 4 oz (90.8 kg)  Height: 5' (1.524 m)    Assessment & Plan:  Visit time 15 minutes in face to face communication with patient and coordination of care, additional 5 minutes spent in record review, coordination or care, ordering tests, communicating/referring to other healthcare professionals, documenting  in medical records all on the same day of the visit for total time 20 minutes spent on the visit.

## 2022-09-25 NOTE — Assessment & Plan Note (Signed)
Noted on x-ray 5th right rib bone linear density. Pulled up x-ray and reviewed with patient. We then went retrospectively for last CXR dating back to 2022 and same lesion is present and unchanged in size or appearance. This is benign and reassurance given no follow up imaging needed.

## 2022-10-27 ENCOUNTER — Other Ambulatory Visit: Payer: Self-pay | Admitting: Internal Medicine

## 2022-11-01 ENCOUNTER — Ambulatory Visit
Admission: RE | Admit: 2022-11-01 | Discharge: 2022-11-01 | Disposition: A | Discharge status: Home / Self Care | Payer: Medicare Other | Source: Ambulatory Visit | Attending: Obstetrics & Gynecology | Admitting: Obstetrics & Gynecology

## 2022-11-01 DIAGNOSIS — Z1231 Encounter for screening mammogram for malignant neoplasm of breast: Secondary | ICD-10-CM

## 2022-12-14 NOTE — Progress Notes (Signed)
[facility]                                     Cardiology Office Note:    Date:  12/25/2022   ID:  Amy Williamson, DOB Oct 25, 1950, MRN [id number]  PCP:  Myrlene Broker, MD  [facility] Cardiologist:  None  [facility] Electrophysiologist:  Dr. Izora Ribas, MD/ Lanier Prude, MD   Referring MD: Myrlene Broker, *   Chief Complaint  Patient presents with   Follow-up    6 month s/p LAAO    History of Present Illness:    Amy Williamson is a 72 y.o. female with a hx of HTN, aortic atherosclerosis, OSA, HLD, and atrial fibrillation s/p LAAO closure with Watchman 06/28/22.   [name] is followed closely in the AF Clinic and was initially dx with AF 06/2020 and underwent DCCV on 07/28/20. She has been anticoagulated with Eliquis. She presented to the ED 09/26/21 with symptoms palpitations and underwent repeat DCCV but unfortunately returned to the ED on 10/06/21 and was back in AF once again. She was trialed on Flecainide but had intolerable side effects. She was seen by Dr Lalla Brothers on 12/20/21 and was scheduled for AF ablation 03/29/22 with Watchman implant to follow. She is now s/p successful left atrial appendage occlusive device placement with Watchman FLX 27mm device. She was continues on Eliquis 5mg  PO BID and transitioned to Plavix after 45 days. Post implant CT showed a well seated device with no leak or device thrombus.   She is here today with her husband and reports that she has been doing well from a CV standpoint however has been having issues with back pain. She is followed by neurosurgery and will seen them soon. She denies chest pain, SOB, palpitations, LE edema, orthopnea, PND, dizziness, or syncope. Denies bleeding in stool or urine.    Past Medical History:  Diagnosis Date   Anticoagulant long-term use    eliquis --- managed by cardiology   Arthritis    BACK AND SHOULDERS   Depressive disorder, not elsewhere classified    Essential  hypertension    followed by pcp and cardiology   Frequency of urination    GERD (gastroesophageal reflux disease)    History of dysphagia 2015   s/p egd w/ dilation, pt did not have stricture   History of gastritis    OSA (obstructive sleep apnea)    followed by dr t. turner---  study in epic 09-03-2020 moderate osa ,  pt scheduled for cpap titration 12-08-2020   PAF (paroxysmal atrial fibrillation) [facility])    cardiologist-- dr Judie Petit. Izora Ribas---- new onset 06-30-2020 at pcp office visit,  s/p DCCV 07-28-2020 successful   Palpitations 06/2020   11-16-2020  per pt denies palpitations since DCCV 07-28-2020   Plantar fasciitis 08/2017   left foot    PONV (postoperative nausea and vomiting)    nausea after back surgery, no issues after ablation   Presence of Watchman left atrial appendage closure device 06/28/2022   Watchman FLX 27mm with Dr. Lalla Brothers   Pure hypercholesterolemia     Past Surgical History:  Procedure Laterality Date   ATRIAL FIBRILLATION ABLATION N/A 03/30/2022   Procedure: ATRIAL FIBRILLATION ABLATION;  Surgeon: Lanier Prude, MD;  Location: [facility] INVASIVE CV LAB;  Service: Cardiovascular;  Laterality: N/A;   BREAST LUMPECTOMY WITH RADIOACTIVE SEED LOCALIZATION Left 06/09/2020  Procedure: LEFT BREAST LUMPECTOMY WITH RADIOACTIVE SEED LOCALIZATION;  Surgeon: Abigail Miyamoto, MD;  Location: [facility];  Service: General;  Laterality: Left;  LMA   CARDIOVERSION N/A 07/28/2020   Procedure: CARDIOVERSION;  Surgeon: Christell Constant, MD;  Location: [facility] ENDOSCOPY;  Service: Cardiovascular;  Laterality: N/A;   CARPAL TUNNEL RELEASE Bilateral right 2004;  left 2006   CATARACT EXTRACTION W/ INTRAOCULAR LENS  IMPLANT, BILATERAL  2016   CESAREAN SECTION  [id number]   BILATERAL TUBAL LIGATION W/ LAST C/S   COLONOSCOPY WITH PROPOFOL  last one 08/ 2013   DILATATION & CURETTAGE/HYSTEROSCOPY WITH MYOSURE N/A 11/22/2020   Procedure: DILATATION &  CURETTAGE/HYSTEROSCOPY WITH MYOSURE;  Surgeon: Jerene Bears, MD;  Location: [facility];  Service: Gynecology;  Laterality: N/A;   EAR CYST EXCISION  08/03/2011   patient states this is incorrect was a Baker's cyst popliteal area   ESOPHAGOGASTRODUODENOSCOPY (EGD) WITH ESOPHAGEAL DILATION  08/2013   KNEE ARTHROSCOPY W/ MENISCAL REPAIR Left 08-03-2011  @[facility]    and excision baker's cyst   LEFT ATRIAL APPENDAGE OCCLUSION N/A 06/28/2022   Procedure: LEFT ATRIAL APPENDAGE OCCLUSION;  Surgeon: Lanier Prude, MD;  Location: [facility] INVASIVE CV LAB;  Service: Cardiovascular;  Laterality: N/A;   PULLEY RELEASE RIGHT TRIGGER FINGER Right 01/08/2011   TEE WITHOUT CARDIOVERSION N/A 06/28/2022   Procedure: TRANSESOPHAGEAL ECHOCARDIOGRAM (TEE);  Surgeon: Lanier Prude, MD;  Location: [facility] INVASIVE CV LAB;  Service: Cardiovascular;  Laterality: N/A;   TRIGGER FINGER RELEASE Bilateral left 05/06/14;  right  02-29-2014    Current Medications: Current Meds  Medication Sig   acetaminophen (TYLENOL) 650 MG CR tablet Take 1,300 mg by mouth in the morning and at bedtime.   albuterol (VENTOLIN HFA) 108 (90 Base) MCG/ACT inhaler Inhale 1-2 puffs into the lungs every 6 (six) hours as needed for wheezing or shortness of breath.   diltiazem (CARDIZEM CD) 240 MG 24 hr capsule TAKE 1 CAPSULE BY MOUTH EVERY DAY   flecainide (TAMBOCOR) 50 MG tablet Take 0.5 tablets (25 mg total) by mouth 2 (two) times daily as needed (breakthrough afib).   fluticasone (FLONASE) 50 MCG/ACT nasal spray Place 2 sprays into both nostrils daily as needed for allergies or rhinitis.   NON FORMULARY Vitamin D 3 Vitamin B 12   pantoprazole (PROTONIX) 40 MG tablet Take 1 tablet (40 mg total) by mouth daily.   rosuvastatin (CRESTOR) 20 MG tablet TAKE 1 TABLET BY MOUTH EVERY DAY   [DISCONTINUED] amoxicillin (AMOXIL) 500 MG tablet Take 4 tablets (2,000 mg total) by mouth as directed. Take 4 tablets (2,000 mg) 1 hour prior to all  dental visits until 12/28/2022.   [DISCONTINUED] benzonatate (TESSALON) 100 MG capsule Take 1 capsule (100 mg total) by mouth every 8 (eight) hours as needed for cough.   [DISCONTINUED] clopidogrel (PLAVIX) 75 MG tablet Take 1 tablet (75 mg total) by mouth daily.   [DISCONTINUED] omeprazole (PRILOSEC) 40 MG capsule Take by mouth.   [DISCONTINUED] traZODone (DESYREL) 50 MG tablet Take 1 tablet (50 mg total) by mouth at bedtime.     Allergies:   Tramadol, Atenolol, Avelox [moxifloxacin hcl in nacl], Azithromycin, Doxycycline, Myrbetriq [mirabegron], Naproxen, and Oxybutynin   Social History   Socioeconomic History   Marital status: Married    Spouse name: Dorene Sorrow   Number of children: 2   Years of education: Not on file   Highest education level: Not on file  Occupational History   Occupation: retired  Tobacco Use  Smoking status: Never   Smokeless tobacco: Never   Tobacco comments:    Never smoke 10/11/21  Vaping Use   Vaping Use: Never used  Substance and Sexual Activity   Alcohol use: No   Drug use: Never   Sexual activity: Not Currently    Birth control/protection: Surgical, Post-menopausal    Comment: BTL  Other Topics Concern   Not on file  Social History Narrative   Not on file   Social Determinants of Health   Financial Resource Strain: Low Risk  (07/02/2022)   Overall Financial Resource Strain (CARDIA)    Difficulty of Paying Living Expenses: Not hard at all  Food Insecurity: No Food Insecurity (07/02/2022)   Hunger Vital Sign    Worried About Running Out of Food in the Last Year: Never true    Ran Out of Food in the Last Year: Never true  Transportation Needs: No Transportation Needs (07/02/2022)   PRAPARE - Administrator, Civil Service (Medical): No    Lack of Transportation (Non-Medical): No  Physical Activity: Sufficiently Active (07/02/2022)   Exercise Vital Sign    Days of Exercise per Week: 5 days    Minutes of Exercise per Session: 30 min   Stress: No Stress Concern Present (07/02/2022)   [employer] of Occupational Health - Occupational Stress Questionnaire    Feeling of Stress : Not at all  Social Connections: Moderately Integrated (07/02/2022)   Social Connection and Isolation Panel [NHANES]    Frequency of Communication with Friends and Family: Twice a week    Frequency of Social Gatherings with Friends and Family: Twice a week    Attends Religious Services: More than 4 times per year    Active Member of [employer] or Organizations: No    Attends Engineer, structural: Never    Marital Status: Married     Family History: The patient's family history includes Clotting disorder in her father; Heart disease in her father; Heart failure in her mother; Hyperlipidemia in her mother; Hypertension in her mother; Liver cancer in her brother; Liver disease in her maternal aunt; Osteoporosis in her mother. There is no history of Colon cancer.  ROS:   Please see the history of present illness.    All other systems reviewed and are negative.  EKGs/Labs/Other Studies Reviewed:    The following studies were reviewed today:  LAAO/ Watchman 06/28/22:  Primary Discharge Diagnosis:  Persistent Atrial Fibrillation Poor candidacy for long term anticoagulation due to refusal of long-term oral anticoagulation   Secondary Discharge Diagnosis:  -HTN -aortic atherosclerosis -OSA -HLD   Procedures This Admission:  Transeptal Puncture Intra-procedural TEE which showed no LAA thrombus Left atrial appendage occlusive device placement on 06/28/22 by Dr. Lalla Brothers.    This study demonstrated:   CONCLUSIONS:  1.Successful implantation of a WATCHMAN left atrial appendage occlusive device 2. TEE demonstrating no LAA thrombus 3. No early apparent complications.     Post Implant Anticoagulation Strategy: Continue Eliquis 5mg  PO BID x 45 days after Watchman implant and then transition to Plavix 75mg  PO daily to complete 6 months of  post implant therapy. CT scan planned 60 days after implant to assess Watchman position.  EKG:  EKG is not ordered today.    Recent Labs: 01/02/2022: Magnesium 2.0; TSH 1.98 02/26/2022: ALT 12 06/18/2022: BUN 15; Creatinine, Ser 0.76; Hemoglobin 12.2; Platelets 230; Potassium 4.3; Sodium 143   Recent Lipid Panel    Component Value Date/Time   CHOL 126 07/04/2022 0912  CHOL 155 05/09/2021 0732   TRIG 87.0 07/04/2022 0912   TRIG 54 06/25/2006 0725   HDL 56.70 07/04/2022 0912   HDL 62 05/09/2021 0732   CHOLHDL 2 07/04/2022 0912   VLDL 17.4 07/04/2022 0912   LDLCALC 52 07/04/2022 0912   LDLCALC 72 05/09/2021 0732   LDLDIRECT 142.3 03/16/2013 0732   Physical Exam:    VS:  BP 132/70   Pulse 83   Ht 5' (1.524 m)   Wt 203 lb (92.1 kg)   LMP 07/23/2004   SpO2 97%   BMI 39.65 kg/m     Wt Readings from Last 3 Encounters:  12/24/22 203 lb (92.1 kg)  09/25/22 200 lb 4 oz (90.8 kg)  07/25/22 203 lb 6.4 oz (92.3 kg)    General: Well developed, well nourished, NAD Lungs:Clear to ausculation bilaterally. No wheezes, rales, or rhonchi. Breathing is unlabored. Cardiovascular: RRR with S1 S2. No murmurs Extremities: No edema.  Neuro: Alert and oriented. No focal deficits. No facial asymmetry. MAE spontaneously. Psych: Responds to questions appropriately with normal affect.    ASSESSMENT/PLAN:    Paroxsymal atrial fibrillation: s/p AF ablation 03/29/22 and subsequent LAAO closure with Watchman FLX 27mm device 06/28/22. Post procedure CT showed well seated device with no leak or thrombus. Given duration since implant, she may now stop her Plavix as of 12/28/22 and will no longer require dental SBE.  Plan to touch base with our team at 1 year and will otherwise follow with Dr. Izora Ribas for her cardiology care.    HTN: Stable with no changes needed at this time.   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 She will follow with neurosurgery team soon. Given that she is no longer taking DOAC/antiplatlets, discussed adding NSAIDs to her pain regimen until she is seen.   Medication Adjustments/Labs and Tests Ordered: Current medicines are reviewed at length with the patient today.  Concerns regarding medicines are outlined above.  No orders of the defined types were placed in this encounter.  Meds ordered this encounter  Medications   DISCONTD: clopidogrel (PLAVIX) 75 MG tablet    Sig: Take 1 tablet (75 mg total) by mouth daily. STOP ON 6/7    Dispense:  90 tablet    Refill:  1    STOP ON 6/7    Patient Instructions  Medication Instructions:  Your physician has recommended you make the following change in your medication:  STOP PLAVIX  ON 12/28/22 *If you need a refill on your cardiac medications before your next appointment, please call your pharmacy*   Lab Work: NONE If you have labs (blood work) drawn today and your tests are completely normal, you will receive your results only by: MyChart Message (if you have MyChart) OR A paper copy in the mail If you have any lab test that is abnormal or we need to change your treatment, we will call you to review the results.   Testing/Procedures: NONE   Follow-Up: At [facility], you and your health needs are our priority.  As part of our continuing mission to provide you with exceptional heart care, we have created designated Provider Care Teams.  These Care Teams include your primary Cardiologist (physician) and Advanced Practice Providers (APPs -  Physician Assistants and Nurse Practitioners) who all work together to provide you with the care you need, when you need it.  We recommend signing up for  the patient portal called "MyChart".  Sign up information is provided on this After Visit Summary.  MyChart is used to connect with patients for Virtual Visits (Telemedicine).  Patients are able to view lab/test results,  encounter notes, upcoming appointments, etc.  Non-urgent messages can be sent to your provider as well.   To learn more about what you can do with MyChart, go to [email or web address].    Your next appointment:   WILL BE WITH DR. QMVHQIONGEX    SignedGeorgie Chard, NP  12/25/2022 10:22 AM    [facility]

## 2022-12-24 ENCOUNTER — Ambulatory Visit: Payer: Medicare Other | Attending: Cardiology | Admitting: Cardiology

## 2022-12-24 VITALS — BP 132/70 | HR 83 | Ht 60.0 in | Wt 203.0 lb

## 2022-12-24 DIAGNOSIS — Z95818 Presence of other cardiac implants and grafts: Secondary | ICD-10-CM | POA: Diagnosis not present

## 2022-12-24 DIAGNOSIS — M4310 Spondylolisthesis, site unspecified: Secondary | ICD-10-CM

## 2022-12-24 DIAGNOSIS — I480 Paroxysmal atrial fibrillation: Secondary | ICD-10-CM

## 2022-12-24 DIAGNOSIS — I10 Essential (primary) hypertension: Secondary | ICD-10-CM | POA: Diagnosis not present

## 2022-12-24 DIAGNOSIS — M159 Polyosteoarthritis, unspecified: Secondary | ICD-10-CM

## 2022-12-24 MED ORDER — CLOPIDOGREL BISULFATE 75 MG PO TABS: 75.0000 mg | ORAL_TABLET | Freq: Every day | ORAL | 1 refills | Status: DC

## 2022-12-24 NOTE — Patient Instructions (Addendum)
Medication Instructions:  Your physician has recommended you make the following change in your medication:  STOP PLAVIX  ON 12/28/22 *If you need a refill on your cardiac medications before your next appointment, please call your pharmacy*   Lab Work: NONE If you have labs (blood work) drawn today and your tests are completely normal, you will receive your results only by: MyChart Message (if you have MyChart) OR A paper copy in the mail If you have any lab test that is abnormal or we need to change your treatment, we will call you to review the results.   Testing/Procedures: NONE   Follow-Up: At [facility], you and your health needs are our priority.  As part of our continuing mission to provide you with exceptional heart care, we have created designated Provider Care Teams.  These Care Teams include your primary Cardiologist (physician) and Advanced Practice Providers (APPs -  Physician Assistants and Nurse Practitioners) who all work together to provide you with the care you need, when you need it.  We recommend signing up for the patient portal called "MyChart".  Sign up information is provided on this After Visit Summary.  MyChart is used to connect with patients for Virtual Visits (Telemedicine).  Patients are able to view lab/test results, encounter notes, upcoming appointments, etc.  Non-urgent messages can be sent to your provider as well.   To learn more about what you can do with MyChart, go to [email or web address].    Your next appointment:   WILL BE WITH DR. [facility]

## 2023-01-02 ENCOUNTER — Ambulatory Visit: Payer: Medicare Other | Admitting: Internal Medicine

## 2023-01-02 ENCOUNTER — Encounter: Payer: Self-pay | Admitting: Internal Medicine

## 2023-01-02 VITALS — BP 140/84 | HR 65 | Temp 98.7°F | Ht 60.0 in | Wt 198.0 lb

## 2023-01-02 DIAGNOSIS — I700 Atherosclerosis of aorta: Secondary | ICD-10-CM

## 2023-01-02 DIAGNOSIS — M791 Myalgia, unspecified site: Secondary | ICD-10-CM | POA: Diagnosis not present

## 2023-01-02 DIAGNOSIS — T466X5A Adverse effect of antihyperlipidemic and antiarteriosclerotic drugs, initial encounter: Secondary | ICD-10-CM

## 2023-01-02 NOTE — Progress Notes (Signed)
   Subjective:   Patient ID: Amy Williamson, female    DOB: 20-Sep-1950, 72 y.o.   MRN: [id number]  HPI The patient is a 72 YO female coming in for follow up.   Review of Systems  Constitutional: Negative.   HENT: Negative.    Eyes: Negative.   Respiratory:  Negative for cough, chest tightness and shortness of breath.   Cardiovascular:  Negative for chest pain, palpitations and leg swelling.  Gastrointestinal:  Negative for abdominal distention, abdominal pain, constipation, diarrhea, nausea and vomiting.  Musculoskeletal:  Positive for arthralgias and myalgias.  Skin: Negative.   Neurological: Negative.   Psychiatric/Behavioral: Negative.      Objective:  Physical Exam Constitutional:      Appearance: She is well-developed.  HENT:     Head: Normocephalic and atraumatic.  Cardiovascular:     Rate and Rhythm: Normal rate and regular rhythm.  Pulmonary:     Effort: Pulmonary effort is normal. No respiratory distress.     Breath sounds: Normal breath sounds. No wheezing or rales.  Abdominal:     General: Bowel sounds are normal. There is no distension.     Palpations: Abdomen is soft.     Tenderness: There is no abdominal tenderness. There is no rebound.  Musculoskeletal:        General: Tenderness present.     Cervical back: Normal range of motion.  Skin:    General: Skin is warm and dry.  Neurological:     Mental Status: She is alert and oriented to person, place, and time.     Coordination: Coordination normal.     Vitals:   01/02/23 0844 01/02/23 0847  BP: (!) 140/84 (!) 140/84  Pulse: 65   Temp: 98.7 F (37.1 C)   TempSrc: Oral   SpO2: 99%   Weight: 198 lb (89.8 kg)   Height: 5' (1.524 m)     Assessment & Plan:

## 2023-01-03 NOTE — Assessment & Plan Note (Signed)
She has stopped taking crestor due to muscle aches and does not want to retrial. Explained CV risk.

## 2023-01-03 NOTE — Assessment & Plan Note (Signed)
BMI 38 and complicated by hypertension and GERD and hyperlipidemia.

## 2023-01-03 NOTE — Assessment & Plan Note (Signed)
She has stopped crestor 20 mg daily due to muscle aches and she does not want to retrial this at lower dose.

## 2023-01-28 ENCOUNTER — Telehealth: Payer: Self-pay | Admitting: Internal Medicine

## 2023-01-28 ENCOUNTER — Other Ambulatory Visit: Payer: Self-pay | Admitting: Internal Medicine

## 2023-01-28 MED ORDER — DILTIAZEM HCL ER COATED BEADS 240 MG PO CP24: 240.0000 mg | ORAL_CAPSULE | Freq: Every day | ORAL | 3 refills | Status: DC

## 2023-01-28 NOTE — Telephone Encounter (Signed)
I called the pharmacy to see if there was a problem with the cardizem CD formulation and ask which formulation they can get. The pharmacist did not seem to understand that there are several branded generics of diltiazem. She gave me a list of other [facility] that have "it" but patient does not want to drive that far. Patient states her husband goes to [facility] and they had to change him to [place]. Explained to her that its still diltiazem just the formulation is slightly different and they technically aren't interchangable. She wants me to [facility].  She will call back if there is an issue.

## 2023-01-28 NOTE — Telephone Encounter (Signed)
Called pt in regards to diltiazem.  Reports was told by pharmacy would need an alternate med.  Pharmacy is having a hard time getting this medication. Advised pt will send concern to our pharmacy to review.

## 2023-01-28 NOTE — Telephone Encounter (Signed)
Pt stated someone called her to talk to her about her medication diltiazem (CARDIZEM CD) 240 MG 24 hr capsule   She stated there was some issues with it at the pharmacy and she was just returning the call   Best number [phone]

## 2023-01-31 ENCOUNTER — Other Ambulatory Visit: Payer: Self-pay | Admitting: Internal Medicine

## 2023-01-31 MED ORDER — DILTIAZEM HCL ER COATED BEADS 240 MG PO CP24: 240.0000 mg | ORAL_CAPSULE | Freq: Every day | ORAL | 3 refills | Status: DC

## 2023-01-31 NOTE — Addendum Note (Signed)
Addended by: Margaret Pyle D on: 01/31/2023 02:21 PM   Modules accepted: Orders

## 2023-02-06 ENCOUNTER — Other Ambulatory Visit: Payer: Self-pay | Admitting: Internal Medicine

## 2023-04-10 ENCOUNTER — Other Ambulatory Visit (HOSPITAL_COMMUNITY): Payer: Self-pay | Admitting: Student

## 2023-04-10 DIAGNOSIS — M4310 Spondylolisthesis, site unspecified: Secondary | ICD-10-CM

## 2023-04-15 ENCOUNTER — Ambulatory Visit (HOSPITAL_COMMUNITY)
Admission: RE | Admit: 2023-04-15 | Discharge: 2023-04-15 | Disposition: A | Discharge status: Home / Self Care | Payer: Medicare Other | Source: Ambulatory Visit | Attending: Student | Admitting: Student

## 2023-04-15 DIAGNOSIS — M4310 Spondylolisthesis, site unspecified: Secondary | ICD-10-CM | POA: Insufficient documentation

## 2023-04-15 MED ORDER — GADOBUTROL 1 MMOL/ML IV SOLN
9.0000 mL | Freq: Once | INTRAVENOUS | Status: AC | PRN
  Administered 2023-04-15: 9 mL via INTRAVENOUS

## 2023-04-30 ENCOUNTER — Other Ambulatory Visit: Payer: Self-pay | Admitting: Neurosurgery

## 2023-05-01 ENCOUNTER — Telehealth: Payer: Self-pay | Admitting: *Deleted

## 2023-05-01 ENCOUNTER — Telehealth (HOSPITAL_BASED_OUTPATIENT_CLINIC_OR_DEPARTMENT_OTHER): Payer: Self-pay | Admitting: *Deleted

## 2023-05-01 NOTE — Telephone Encounter (Signed)
Name: Amy Williamson  DOB: 1950/10/15  MRN: [id number]  Primary Cardiologist: None   Preoperative team, please contact this patient and set up a phone call appointment for further preoperative risk assessment. Please obtain consent and complete medication review. Thank you for your help.  I confirm that guidance regarding antiplatelet and oral anticoagulation therapy has been completed and, if necessary, noted below (none requested).   I also confirmed the patient resides in the state of [place]. As per [facility] Medical Board telemedicine laws, the patient must reside in the state in which the provider is licensed.   Joylene Grapes, NP 05/01/2023, 11:45 AM [facility]

## 2023-05-01 NOTE — Telephone Encounter (Signed)
Pt has been scheduled tele pre op appt 05/14/23. Med rec and consent are done.

## 2023-05-01 NOTE — Telephone Encounter (Signed)
Pre-operative Risk Assessment    Patient Name: Amy Williamson  DOB: [birth year] MRN: [id number]      Request for Surgical Clearance    Procedure:   Lumbar Fusion  Date of Surgery:  Clearance 05/17/23                                 Surgeon:  Dr. Julio Sicks Surgeon's Group or Practice Name:  [facility] Phone number:  [phone] Fax number:  [phone]   Type of Clearance Requested:   - Medical    Type of Anesthesia:  General    Additional requests/questions:    Signed, Emmit Pomfret   05/01/2023, 7:43 AM

## 2023-05-01 NOTE — Telephone Encounter (Signed)
Pt has been scheduled tele pre op appt 05/14/23. Med rec and consent are done.     Patient Consent for Virtual Visit        Amy Williamson has provided verbal consent on 05/01/2023 for a virtual visit (video or telephone).   CONSENT FOR VIRTUAL VISIT FOR:  Amy Williamson  By participating in this virtual visit I agree to the following:  I hereby voluntarily request, consent and authorize [facility] and its employed or contracted physicians, physician assistants, nurse practitioners or other licensed health care professionals (the Practitioner), to provide me with telemedicine health care services (the "Services") as deemed necessary by the treating Practitioner. I acknowledge and consent to receive the Services by the Practitioner via telemedicine. I understand that the telemedicine visit will involve communicating with the Practitioner through live audiovisual communication technology and the disclosure of certain medical information by electronic transmission. I acknowledge that I have been given the opportunity to request an in-person assessment or other available alternative prior to the telemedicine visit and am voluntarily participating in the telemedicine visit.  I understand that I have the right to withhold or withdraw my consent to the use of telemedicine in the course of my care at any time, without affecting my right to future care or treatment, and that the Practitioner or I may terminate the telemedicine visit at any time. I understand that I have the right to inspect all information obtained and/or recorded in the course of the telemedicine visit and may receive copies of available information for a reasonable fee.  I understand that some of the potential risks of receiving the Services via telemedicine include:  Delay or interruption in medical evaluation due to technological equipment failure or disruption; Information transmitted may not be sufficient (e.g. poor  resolution of images) to allow for appropriate medical decision making by the Practitioner; and/or  In rare instances, security protocols could fail, causing a breach of personal health information.  Furthermore, I acknowledge that it is my responsibility to provide information about my medical history, conditions and care that is complete and accurate to the best of my ability. I acknowledge that Practitioner's advice, recommendations, and/or decision may be based on factors not within their control, such as incomplete or inaccurate data provided by me or distortions of diagnostic images or specimens that may result from electronic transmissions. I understand that the practice of medicine is not an exact science and that Practitioner makes no warranties or guarantees regarding treatment outcomes. I acknowledge that a copy of this consent can be made available to me via my patient portal [facility] MyChart), or I can request a printed copy by calling the office of  [facility].    I understand that my insurance will be billed for this visit.   I have read or had this consent read to me. I understand the contents of this consent, which adequately explains the benefits and risks of the Services being provided via telemedicine.  I have been provided ample opportunity to ask questions regarding this consent and the Services and have had my questions answered to my satisfaction. I give my informed consent for the services to be provided through the use of telemedicine in my medical care

## 2023-05-08 NOTE — Progress Notes (Signed)
Surgical Instructions   Your procedure is scheduled on Friday May 17, 2023. Report to [facility] Main Entrance "A" at 6:00 A.M., then check in with the Admitting office. Any questions or running late day of surgery: call [phone]  Questions prior to your surgery date: call [phone], Monday-Friday, 8am-4pm. If you experience any cold or flu symptoms such as cough, fever, chills, shortness of breath, etc. between now and your scheduled surgery, please notify us at the above number.     Remember:  Do not eat  or drink after midnight the night before your surgery   Take these medicines the morning of surgery with A SIP OF WATER  acetaminophen (TYLENOL)  diltiazem (TIAZAC)  pantoprazole (PROTONIX)    May take these medicines IF NEEDED: albuterol (VENTOLIN HFA) 108 (90 Base) MCG/ACT inhaler.  Please bring inhaler with you to the hospital.  flecainide [facility])    Follow your surgeon's instructions on when to stop Asprin.  If no instructions were given by your surgeon then you will need to call the office to get those instructions.     One week prior to surgery, STOP taking any Aleve, Naproxen, Ibuprofen, Motrin, Advil, Goody's, BC's, all herbal medications, fish oil, and non-prescription vitamins.                     Do NOT Smoke (Tobacco/Vaping) for 24 hours prior to your procedure.  If you use a CPAP at night, you may bring your mask/headgear for your overnight stay.   You will be asked to remove any contacts, glasses, piercing's, hearing aid's, dentures/partials prior to surgery. Please bring cases for these items if needed.    Patients discharged the day of surgery will not be allowed to drive home, and someone needs to stay with them for 24 hours.  SURGICAL WAITING ROOM VISITATION Patients may have no more than 2 support people in the waiting area - these visitors may rotate.   Pre-op nurse will coordinate an appropriate time for 1 ADULT support person, who may  not rotate, to accompany patient in pre-op.  Children under the age of 35 must have an adult with them who is not the patient and must remain in the main waiting area with an adult.  If the patient needs to stay at the hospital during part of their recovery, the visitor guidelines for inpatient rooms apply.  Please refer to the [facility] website for the visitor guidelines for any additional information.   If you received a COVID test during your pre-op visit  it is requested that you wear a mask when out in public, stay away from anyone that may not be feeling well and notify your surgeon if you develop symptoms. If you have been in contact with anyone that has tested positive in the last 10 days please notify you surgeon.      Pre-operative 5 CHG Bathing Instructions   You can play a key role in reducing the risk of infection after surgery. Your skin needs to be as free of germs as possible. You can reduce the number of germs on your skin by washing with CHG (chlorhexidine gluconate) soap before surgery. CHG is an antiseptic soap that kills germs and continues to kill germs even after washing.   DO NOT use if you have an allergy to chlorhexidine/CHG or antibacterial soaps. If your skin becomes reddened or irritated, stop using the CHG and notify one of our RNs at [phone].   Please shower  with the CHG soap starting 4 days before surgery using the following schedule:     Please keep in mind the following:  DO NOT shave, including legs and underarms, starting the day of your first shower.   You may shave your face at any point before/day of surgery.  Place clean sheets on your bed the day you start using CHG soap. Use a clean washcloth (not used since being washed) for each shower. DO NOT sleep with pets once you start using the CHG.   CHG Shower Instructions:  Wash your face and private area with normal soap. If you choose to wash your hair, wash first with your normal shampoo.   After you use shampoo/soap, rinse your hair and body thoroughly to remove shampoo/soap residue.  Turn the water OFF and apply about 3 tablespoons (45 ml) of CHG soap to a CLEAN washcloth.  Apply CHG soap ONLY FROM YOUR NECK DOWN TO YOUR TOES (washing for 3-5 minutes)  DO NOT use CHG soap on face, private areas, open wounds, or sores.  Pay special attention to the area where your surgery is being performed.  If you are having back surgery, having someone wash your back for you may be helpful. Wait 2 minutes after CHG soap is applied, then you may rinse off the CHG soap.  Pat dry with a clean towel  Put on clean clothes/pajamas   If you choose to wear lotion, please use ONLY the CHG-compatible lotions on the back of this paper.   Additional instructions for the day of surgery: DO NOT APPLY any lotions, deodorants or perfumes.   Do not bring valuables to the hospital. [facility] is not responsible for any belongings/valuables. Do not wear nail polish, gel polish, artificial nails, or any other type of covering on natural nails (fingers and toes) Do not wear jewelry or makeup Put on clean/comfortable clothes.  Please brush your teeth.  Ask your nurse before applying any prescription medications to the skin.     CHG Compatible Lotions   Aveeno Moisturizing lotion  Cetaphil Moisturizing Cream  Cetaphil Moisturizing Lotion  Clairol Herbal Essence Moisturizing Lotion, Dry Skin  Clairol Herbal Essence Moisturizing Lotion, Extra Dry Skin  Clairol Herbal Essence Moisturizing Lotion, Normal Skin  Curel Age Defying Therapeutic Moisturizing Lotion with Alpha Hydroxy  Curel Extreme Care Body Lotion  Curel Soothing Hands Moisturizing Hand Lotion  Curel Therapeutic Moisturizing Cream, Fragrance-Free  Curel Therapeutic Moisturizing Lotion, Fragrance-Free  Curel Therapeutic Moisturizing Lotion, Original Formula  Eucerin Daily Replenishing Lotion  Eucerin Dry Skin Therapy Plus Alpha Hydroxy  Crme  Eucerin Dry Skin Therapy Plus Alpha Hydroxy Lotion  Eucerin Original Crme  Eucerin Original Lotion  Eucerin Plus Crme Eucerin Plus Lotion  Eucerin TriLipid Replenishing Lotion  Keri Anti-Bacterial Hand Lotion  Keri Deep Conditioning Original Lotion Dry Skin Formula Softly Scented  Keri Deep Conditioning Original Lotion, Fragrance Free Sensitive Skin Formula  Keri Lotion Fast Absorbing Fragrance Free Sensitive Skin Formula  Keri Lotion Fast Absorbing Softly Scented Dry Skin Formula  Keri Original Lotion  Keri Skin Renewal Lotion Keri Silky Smooth Lotion  Keri Silky Smooth Sensitive Skin Lotion  Nivea Body Creamy Conditioning Oil  Nivea Body Extra Enriched Teacher, adult education Moisturizing Lotion Nivea Crme  Nivea Skin Firming Lotion  NutraDerm 30 Skin Lotion  NutraDerm Skin Lotion  NutraDerm Therapeutic Skin Cream  NutraDerm Therapeutic Skin Lotion  ProShield Protective Hand Cream  Provon moisturizing lotion  Please read  over the following fact sheets that you were given.

## 2023-05-09 ENCOUNTER — Encounter (HOSPITAL_COMMUNITY): Payer: Self-pay

## 2023-05-09 ENCOUNTER — Encounter (HOSPITAL_COMMUNITY)
Admission: RE | Admit: 2023-05-09 | Discharge: 2023-05-09 | Disposition: A | Discharge status: Home / Self Care | Payer: Medicare Other | Source: Ambulatory Visit | Attending: Neurosurgery | Admitting: Neurosurgery

## 2023-05-09 ENCOUNTER — Other Ambulatory Visit: Payer: Self-pay

## 2023-05-09 VITALS — BP 159/65 | HR 71 | Temp 98.0°F | Resp 18 | Ht 60.0 in | Wt 207.4 lb

## 2023-05-09 DIAGNOSIS — Z01818 Encounter for other preprocedural examination: Secondary | ICD-10-CM | POA: Insufficient documentation

## 2023-05-09 DIAGNOSIS — I2510 Atherosclerotic heart disease of native coronary artery without angina pectoris: Secondary | ICD-10-CM | POA: Insufficient documentation

## 2023-05-09 HISTORY — DX: Headache, unspecified: R51.9

## 2023-05-09 HISTORY — DX: Pneumonia, unspecified organism: J18.9

## 2023-05-09 LAB — CBC
HCT: 41.8 % (ref 36.0–46.0)
Hemoglobin: 13.3 g/dL (ref 12.0–15.0)
MCH: 28 pg (ref 26.0–34.0)
MCHC: 31.8 g/dL (ref 30.0–36.0)
MCV: 88 fL (ref 80.0–100.0)
Platelets: 254 10*3/uL (ref 150–400)
RBC: 4.75 MIL/uL (ref 3.87–5.11)
RDW: 12.7 % (ref 11.5–15.5)
WBC: 10 10*3/uL (ref 4.0–10.5)
nRBC: 0 % (ref 0.0–0.2)

## 2023-05-09 LAB — TYPE AND SCREEN
ABO/RH(D): O POS
Antibody Screen: NEGATIVE

## 2023-05-09 LAB — BASIC METABOLIC PANEL
Anion gap: 7 (ref 5–15)
BUN: 16 mg/dL (ref 8–23)
CO2: 27 mmol/L (ref 22–32)
Calcium: 9.4 mg/dL (ref 8.9–10.3)
Chloride: 103 mmol/L (ref 98–111)
Creatinine, Ser: 0.74 mg/dL (ref 0.44–1.00)
GFR, Estimated: >60 mL/min (ref >60)
Glucose, Bld: 106 mg/dL — ABNORMAL HIGH (ref 70–99)
Potassium: 4.3 mmol/L (ref 3.5–5.1)
Sodium: 137 mmol/L (ref 135–145)

## 2023-05-09 LAB — SURGICAL PCR SCREEN
MRSA, PCR: NEGATIVE
Staphylococcus aureus: NEGATIVE

## 2023-05-09 NOTE — Progress Notes (Signed)
PCP -    Myrlene Broker, MD   Cardiologist - Riley Lam MD   PPM/ICD - denies   Chest x-ray - 09/06/22 EKG - 06/28/22 Stress Test - 10/19/21 ECHO - 06/28/22 Cardiac Cath - denies  Sleep Study - pt reports having sleep apnea, She cannot use CPAP, so she uses mouth device   Fasting Blood Sugar - N/A   Last dose of GLP1 agonist- N/A    Blood Thinner Instructions: N/A- pt reports that she is no longer on blood thinners.  Aspirin Instructions:Pt last dose of ASA 05/06/23  ERAS Protcol - NPO order   COVID TEST- N/A   Anesthesia review: yes- cardiac history, pt had Watchman sx 06/2022. Pt states she has telephone visit with NP scheduled for 05/14/23 for cardiac clearance for surgery. Pt denies any issues. Pt reports she has only taken flecainide once a few months ago d/t her HR being up to 115. Pt states she has not had to take it since.   Patient denies shortness of breath, fever, cough and chest pain at PAT appointment   All instructions explained to the patient, with a verbal understanding of the material. Patient agrees to go over the instructions while at home for a better understanding. The opportunity to ask questions was provided.

## 2023-05-10 NOTE — Progress Notes (Signed)
Anesthesia Chart Review:  72 year old female follows cardiology for history of HTN, OSA treated with oral appliance, A-fib s/p LAAO closure and watchman 06/28/2022. Post procedure CT showed well seated device with no leak or thrombus.  When seen by Georgie Chard, NP on 12/24/2022 she was cleared to stop Plavix as of 12/28/2022.  Patient has preop clearance scheduled for 05/14/2023.  Preop labs reviewed, WNL.  EKG 06/28/2022: Sinus rhythm with first-degree AV block.  Rate 67.  TEE 06/28/2022: 1. Interventional TEE for Watchman FLX procedure.   2. Prior to procedure, there was a patent left atrial appendage with a  windsock morphology. Maximal diameter 20 cm (ostial deployment) with  suitable depth for device.   3. Mid- mid stick with no complications.   4. Placement of 27 mm Watchman FLX device. No leak. No Mitral shoulder.  Average compression ~ 23%.   5. Left ventricular ejection fraction, by estimation, is 60 to 65%. The  left ventricle has normal function.   6. Right ventricular systolic function is normal. The right ventricular  size is normal.   7. Left atrial size was mildly dilated. No left atrial/left atrial  appendage thrombus was detected.   8. The mitral valve is normal in structure. Mild mitral valve  regurgitation.   9. The aortic valve is normal in structure. Aortic valve regurgitation is  not visualized. No aortic stenosis is present.  10. Evidence of atrial level shunting detected by color flow Doppler after  transeptal puncture, with all left to right flow.  11. Trivial apical pericardial effusion is unchanged from prior.   Nuclear stress 10/19/2021:   The study is normal. The study is low risk.   No ST deviation was noted.   LV perfusion is normal.   Left ventricular function is normal. Nuclear stress EF: 69 %. The left ventricular ejection fraction is hyperdynamic (>65%). End diastolic cavity size is normal.   Prior study not available for comparison.

## 2023-05-10 NOTE — Plan of Care (Signed)
CHL Tonsillectomy/Adenoidectomy, Postoperative PEDS care plan entered in error.

## 2023-05-14 ENCOUNTER — Ambulatory Visit: Payer: Medicare Other | Attending: Cardiology

## 2023-05-14 DIAGNOSIS — Z01810 Encounter for preprocedural cardiovascular examination: Secondary | ICD-10-CM

## 2023-05-14 NOTE — Progress Notes (Signed)
Virtual Visit via Telephone Note   Because of Amy Williamson co-morbid illnesses, she is at least at moderate risk for complications without adequate follow up.  This format is felt to be most appropriate for this patient at this time.  The patient did not have access to video technology/had technical difficulties with video requiring transitioning to audio format only (telephone).  All issues noted in this document were discussed and addressed.  No physical exam could be performed with this format.  Please refer to the patient's chart for her consent to telehealth for [facility].  Evaluation Performed:  Preoperative cardiovascular risk assessment _____________   Date:  05/14/2023   Patient ID:  Amy Williamson, DOB [birth year], MRN [id number] Patient Location:  Home Provider location:   Office  Primary Care Provider:  Myrlene Broker, MD Primary Cardiologist:  None  Chief Complaint / Patient Profile   72 y.o. y/o female with a h/o hypertension, watchman closure, paroxysmal atrial fibrillation, osteoarthritis who is pending PLIF-L3-L4, Dr. Julio Sicks and presents today for telephonic preoperative cardiovascular risk assessment.  History of Present Illness    Amy Williamson is a 72 y.o. female who presents via audio/video conferencing for a telehealth visit today.  Pt was last seen in cardiology clinic on 12/24/2022 by Georgie Chard, NP.  At that time Amy Williamson was doing well .  The patient is now pending procedure as outlined above. Since her last visit, she continues to be stable from a cardiac standpoint.  Today she denies chest pain, shortness of breath, lower extremity edema, fatigue, palpitations, melena, hematuria, hemoptysis, diaphoresis, weakness, presyncope, syncope, orthopnea, and PND.   Past Medical History    Past Medical History:  Diagnosis Date   Anticoagulant long-term use    eliquis --- managed by cardiology   Arthritis    BACK AND  SHOULDERS   Depressive disorder, not elsewhere classified    Essential hypertension    followed by pcp and cardiology   Frequency of urination    GERD (gastroesophageal reflux disease)    Headache    migraines when younger   History of dysphagia 2015   s/p egd w/ dilation, pt did not have stricture   History of gastritis    OSA (obstructive sleep apnea)    followed by dr t. turner---  study in epic 09-03-2020 moderate osa ,  pt scheduled for cpap titration 12-08-2020- pt wears oral device, cannot wear CPAP   PAF (paroxysmal atrial fibrillation) [facility])    cardiologist-- dr Judie Petit. 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 Lalla Brothers   Pure hypercholesterolemia    Past Surgical History:  Procedure Laterality Date   ATRIAL FIBRILLATION ABLATION N/A 03/30/2022   Procedure: ATRIAL FIBRILLATION ABLATION;  Surgeon: Lanier Prude, MD;  Location: [facility] INVASIVE CV LAB;  Service: Cardiovascular;  Laterality: N/A;   BREAST LUMPECTOMY WITH RADIOACTIVE SEED LOCALIZATION Left 06/09/2020   Procedure: LEFT BREAST LUMPECTOMY WITH RADIOACTIVE SEED LOCALIZATION;  Surgeon: Abigail Miyamoto, MD;  Location: [facility];  Service: General;  Laterality: Left;  LMA  CARDIOVERSION N/A 07/28/2020   Procedure: CARDIOVERSION;  Surgeon: Christell Constant, MD;  Location: [facility] ENDOSCOPY;  Service: Cardiovascular;  Laterality: N/A;   CARPAL TUNNEL RELEASE Bilateral right 2004;  left 2006   CATARACT EXTRACTION W/ INTRAOCULAR LENS  IMPLANT, BILATERAL  2016   CESAREAN SECTION  [id number]   BILATERAL TUBAL LIGATION W/ LAST  C/S   COLONOSCOPY WITH PROPOFOL  last one 08/ 2013   DILATATION & CURETTAGE/HYSTEROSCOPY WITH MYOSURE N/A 11/22/2020   Procedure: DILATATION & CURETTAGE/HYSTEROSCOPY WITH MYOSURE;  Surgeon: Jerene Bears, MD;  Location: [facility];  Service: Gynecology;  Laterality: N/A;   EAR CYST EXCISION  08/03/2011   patient states this is incorrect was a Baker's cyst popliteal area   ESOPHAGOGASTRODUODENOSCOPY (EGD) WITH ESOPHAGEAL DILATION  08/2013   KNEE ARTHROSCOPY W/ MENISCAL REPAIR Left 08-03-2011  @[facility]    and excision baker's cyst   LEFT ATRIAL APPENDAGE OCCLUSION N/A 06/28/2022   Procedure: LEFT ATRIAL APPENDAGE OCCLUSION;  Surgeon: Lanier Prude, MD;  Location: [facility] INVASIVE CV LAB;  Service: Cardiovascular;  Laterality: N/A;   LUMBAR FUSION  02/19/2022   posterior- lumbar 5-6   PULLEY RELEASE RIGHT TRIGGER FINGER Right 01/08/2011   TEE WITHOUT CARDIOVERSION N/A 06/28/2022   Procedure: TRANSESOPHAGEAL ECHOCARDIOGRAM (TEE);  Surgeon: Lanier Prude, MD;  Location: [facility] INVASIVE CV LAB;  Service: Cardiovascular;  Laterality: N/A;   TRIGGER FINGER RELEASE Bilateral left 05/06/14;  right  02-29-2014    Allergies  Allergies  Allergen Reactions   Tramadol Itching and Rash    May have been shingles instead of allergy per patient   Atenolol     Decreased blood pressure   Avelox [Moxifloxacin Hcl In Nacl]     Pt did not like side effects   Azithromycin     ?causes muscle weakness, dysphagia   Doxycycline Nausea Only    And heartburn   Myrbetriq [Mirabegron]     Increased blood pressure   Naproxen     Can not take while on Eliquis   Oxybutynin Palpitations    Per pt made her have rapid heart beat    Home Medications    Prior to Admission medications   Medication Sig Start Date End Date Taking? Authorizing Provider  acetaminophen (TYLENOL) 650 MG CR tablet Take 1,300 mg by mouth in the morning and at bedtime.    [provider]  albuterol (VENTOLIN HFA)  108 (90 Base) MCG/ACT inhaler Inhale 1-2 puffs into the lungs every 6 (six) hours as needed for wheezing or shortness of breath. 09/06/22   Gustavus Bryant, FNP  aspirin EC 81 MG tablet Take 81 mg by mouth daily. Swallow whole.    [provider]  Cholecalciferol (VITAMIN D3 PO) Take 1 tablet by mouth daily.    [provider]  Cyanocobalamin (B-12 PO) Take 1 tablet by mouth daily.    [provider]  diltiazem (CARDIZEM CD) 240 MG 24 hr capsule Take 1 capsule (240 mg total) by mouth daily. Patient not taking: Reported on 05/01/2023 01/31/23   Riley Lam A, MD  diltiazem (TIAZAC) 240 MG 24 hr capsule Take 240 mg by mouth daily. 04/27/23   [provider]  flecainide (TAMBOCOR) 50 MG tablet Take 0.5 tablets (25 mg total) by mouth 2 (two) times daily as needed (breakthrough afib). 12/12/21   Fenton, Clint R, PA  pantoprazole (PROTONIX) 40 MG tablet Take 1 tablet (40 mg total) by mouth daily. 07/04/22   Myrlene Broker, MD  rosuvastatin (CRESTOR) 20 MG tablet TAKE 1 TABLET BY MOUTH EVERY DAY Patient not taking: Reported on 01/02/2023 02/05/22   Christell Constant, MD    Physical Exam    Vital Signs:  Amy Williamson does not have vital signs available for review today.  Given telephonic nature of communication, physical exam is limited. AAOx3. NAD. Normal affect.  Speech and respirations are unlabored.  Accessory Clinical Findings    None  Assessment & Plan    1.  Preoperative Cardiovascular Risk Assessment: Lumbar fusion, Dr. Julio Sicks, [facility] neurosurgery and spine, fax number [phone]      Primary Cardiologist: Armanda Magic   Chart reviewed as part of pre-operative protocol coverage. Given past medical history and time since last visit, based on ACC/AHA guidelines, Amy Williamson would be at acceptable risk for the planned procedure without further cardiovascular testing.   Her RCRI is very low risk, 0.4% risk of major  cardiac event.  She is able to complete greater than 4 METS of activity.  Patient was advised that if he/she develops new symptoms prior to surgery to contact our office to arrange a follow-up appointment.  He verbalized understanding.  I will route this recommendation to the requesting party via Epic fax function and remove from pre-op pool.     Time:   Today, I have spent 5  minutes with the patient with telehealth technology discussing medical history, symptoms, and management plan.  Prior to patient's phone evaluation I spent greater than 10 minutes reviewing their past medical history and cardiac medications.    Ronney Asters, NP  05/14/2023, 7:11 AM

## 2023-05-14 NOTE — Anesthesia Preprocedure Evaluation (Addendum)
Anesthesia Evaluation  Patient identified by MRN, date of birth, ID band Patient awake    Reviewed: Allergy & Precautions, NPO status , Patient's Chart, lab work & pertinent test results  History of Anesthesia Complications (+) PONV and history of anesthetic complications  Airway Mallampati: III  TM Distance: >3 FB Neck ROM: Full   Comment: Previous grade II view with MAC 3, easy mask Dental  (+) Dental Advisory Given   Pulmonary neg shortness of breath, sleep apnea , neg COPD, neg recent URI   Pulmonary exam normal breath sounds clear to auscultation       Cardiovascular hypertension, (-) angina (-) Past MI, (-) Cardiac Stents and (-) CABG + dysrhythmias (1st degree AV block) Atrial Fibrillation  Rhythm:Regular Rate:Normal  HLD, Watchman LAA closure device  TTE 01/10/2022: IMPRESSIONS    1. Left ventricular ejection fraction, by estimation, is 60 to 65%. The  left ventricle has normal function. The left ventricle has no regional  wall motion abnormalities. Left ventricular diastolic parameters are  indeterminate.   2. Right ventricular systolic function is normal. The right ventricular  size is normal. There is normal pulmonary artery systolic pressure.   3. Left atrial size was mildly dilated.   4. The mitral valve is normal in structure. Mild mitral valve  regurgitation. No evidence of mitral stenosis.   5. The aortic valve is grossly normal. Aortic valve regurgitation is not  visualized. No aortic stenosis is present.   6. The inferior vena cava is normal in size with greater than 50%  respiratory variability, suggesting right atrial pressure of 3 mmHg.   Low-risk stress test 10/19/2021    Neuro/Psych  Headaches, neg Seizures PSYCHIATRIC DISORDERS  Depression       GI/Hepatic Neg liver ROS,GERD  Medicated,,  Endo/Other  neg diabetes  Morbid obesity  Renal/GU negative Renal ROS     Musculoskeletal  (+) Arthritis  ,    Abdominal  (+) + obese  Peds  Hematology Lab Results      Component                Value               Date                      WBC                      10.0                05/09/2023                HGB                      13.3                05/09/2023                HCT                      41.8                05/09/2023                MCV                      88.0  05/09/2023                PLT                      254                 05/09/2023              Anesthesia Other Findings   Reproductive/Obstetrics                             Anesthesia Physical Anesthesia Plan  ASA: 3  Anesthesia Plan: General   Post-op Pain Management: Tylenol PO (pre-op)*   Induction: Intravenous  PONV Risk Score and Plan: 4 or greater and Ondansetron, Dexamethasone and Treatment may vary due to age or medical condition  Airway Management Planned: Oral ETT  Additional Equipment:   Intra-op Plan:   Post-operative Plan: Extubation in OR  Informed Consent: I have reviewed the patients History and Physical, chart, labs and discussed the procedure including the risks, benefits and alternatives for the proposed anesthesia with the patient or authorized representative who has indicated his/her understanding and acceptance.     Dental advisory given  Plan Discussed with: Anesthesiologist and CRNA  Anesthesia Plan Comments: (Risks of general anesthesia discussed including, but not limited to, sore throat, hoarse voice, chipped/damaged teeth, injury to vocal cords, nausea and vomiting, allergic reactions, lung infection, heart attack, stroke, and death. All questions answered.   PAT note by Antionette Poles, PA-C: 72 year old female follows cardiology for history of HTN, OSA treated with oral appliance, A-fib s/p LAAO closure and watchman 06/28/2022. Post procedure CT showed well seated device with no leak or thrombus.  When seen by Georgie Chard, NP on  12/24/2022 she was cleared to stop Plavix as of 12/28/2022.  Clearance per telephone encounter 05/14/2023 by Edd Fabian, NP, "Chart reviewed as part of pre-operative protocol coverage. Given past medical history and time since last visit, based on ACC/AHA guidelines, MEMPHIS MENDEN would be at acceptable risk for the planned procedure without further cardiovascular testing. Her RCRI is very low risk, 0.4% risk of major cardiac event.  She is able to complete greater than 4 METS of activity. Patient was advised that if he/she develops new symptoms prior to surgery to contact our office to arrange a follow-up appointment.  He verbalized understanding."  Other pertinent history includes postoperative nausea and vomiting and GERD.  Preop labs reviewed, WNL.  EKG 06/28/2022: Sinus rhythm with first-degree AV block.  Rate 67.  TEE 06/28/2022: 1. Interventional TEE for Watchman FLX procedure.  2. Prior to procedure, there was a patent left atrial appendage with a  windsock morphology. Maximal diameter 20 cm (ostial deployment) with  suitable depth for device.  3. Mid- mid stick with no complications.  4. Placement of 27 mm Watchman FLX device. No leak. No Mitral shoulder.  Average compression ~ 23%.  5. Left ventricular ejection fraction, by estimation, is 60 to 65%. The  left ventricle has normal function.  6. Right ventricular systolic function is normal. The right ventricular  size is normal.  7. Left atrial size was mildly dilated. No left atrial/left atrial  appendage thrombus was detected.  8. The mitral valve is normal in structure. Mild mitral valve  regurgitation.  9. The aortic valve is normal in structure. Aortic valve regurgitation is  not visualized. No aortic stenosis is present.  10. Evidence of  atrial level shunting detected by color flow Doppler after  transeptal puncture, with all left to right flow.  11. Trivial apical pericardial effusion is unchanged from prior.    Nuclear stress 10/19/2021:   The study is normal. The study is low risk.   No ST deviation was noted.   LV perfusion is normal.   Left ventricular function is normal. Nuclear stress EF: 69 %. The left ventricular ejection fraction is hyperdynamic (>65%). End diastolic cavity size is normal.   Prior study not available for comparison.   )        Anesthesia Quick Evaluation

## 2023-05-17 ENCOUNTER — Ambulatory Visit (HOSPITAL_COMMUNITY): Payer: Medicare Other

## 2023-05-17 ENCOUNTER — Observation Stay (HOSPITAL_COMMUNITY)
Admission: RE | Admit: 2023-05-17 | Discharge: 2023-05-18 | Disposition: A | Discharge status: Home / Self Care | Payer: Medicare Other | Source: Ambulatory Visit | Attending: Neurosurgery | Admitting: Neurosurgery

## 2023-05-17 ENCOUNTER — Other Ambulatory Visit: Payer: Self-pay

## 2023-05-17 ENCOUNTER — Ambulatory Visit (HOSPITAL_COMMUNITY): Payer: Medicare Other | Admitting: Physician Assistant

## 2023-05-17 ENCOUNTER — Ambulatory Visit (HOSPITAL_BASED_OUTPATIENT_CLINIC_OR_DEPARTMENT_OTHER): Payer: Medicare Other | Admitting: Anesthesiology

## 2023-05-17 ENCOUNTER — Encounter (HOSPITAL_COMMUNITY)
Admission: RE | Disposition: A | Discharge status: Home / Self Care | Payer: Self-pay | Source: Ambulatory Visit | Attending: Neurosurgery

## 2023-05-17 ENCOUNTER — Encounter (HOSPITAL_COMMUNITY): Payer: Self-pay | Admitting: Neurosurgery

## 2023-05-17 DIAGNOSIS — Z7982 Long term (current) use of aspirin: Secondary | ICD-10-CM | POA: Diagnosis not present

## 2023-05-17 DIAGNOSIS — I480 Paroxysmal atrial fibrillation: Secondary | ICD-10-CM | POA: Diagnosis not present

## 2023-05-17 DIAGNOSIS — M4316 Spondylolisthesis, lumbar region: Secondary | ICD-10-CM | POA: Insufficient documentation

## 2023-05-17 DIAGNOSIS — Z7901 Long term (current) use of anticoagulants: Secondary | ICD-10-CM | POA: Insufficient documentation

## 2023-05-17 DIAGNOSIS — M7138 Other bursal cyst, other site: Secondary | ICD-10-CM | POA: Diagnosis present

## 2023-05-17 DIAGNOSIS — M48061 Spinal stenosis, lumbar region without neurogenic claudication: Secondary | ICD-10-CM | POA: Diagnosis not present

## 2023-05-17 DIAGNOSIS — M5416 Radiculopathy, lumbar region: Secondary | ICD-10-CM | POA: Diagnosis not present

## 2023-05-17 DIAGNOSIS — Z79899 Other long term (current) drug therapy: Secondary | ICD-10-CM | POA: Diagnosis not present

## 2023-05-17 DIAGNOSIS — I10 Essential (primary) hypertension: Secondary | ICD-10-CM | POA: Insufficient documentation

## 2023-05-17 SURGERY — POSTERIOR LUMBAR FUSION 1 LEVEL
Anesthesia: General | Site: Back

## 2023-05-17 MED ORDER — SUGAMMADEX SODIUM 200 MG/2ML IV SOLN
INTRAVENOUS | Status: DC | PRN
  Administered 2023-05-17: 200 mg via INTRAVENOUS

## 2023-05-17 MED ORDER — VITAMIN D 25 MCG (1000 UNIT) PO TABS: 1000.0000 [IU] | ORAL_TABLET | Freq: Every day | ORAL | Status: DC

## 2023-05-17 MED ORDER — SODIUM CHLORIDE 0.9% FLUSH: 3.0000 mL | INTRAVENOUS | Status: DC | PRN

## 2023-05-17 MED ORDER — PHENYLEPHRINE HCL-NACL 20-0.9 MG/250ML-% IV SOLN
INTRAVENOUS | Status: DC | PRN
  Administered 2023-05-17: 20 ug/min via INTRAVENOUS

## 2023-05-17 MED ORDER — EPHEDRINE SULFATE-NACL 50-0.9 MG/10ML-% IV SOSY
PREFILLED_SYRINGE | INTRAVENOUS | Status: DC | PRN
  Administered 2023-05-17 (×2): 10 mg via INTRAVENOUS

## 2023-05-17 MED ORDER — OXYCODONE HCL 5 MG PO TABS
ORAL_TABLET | ORAL | Status: AC
  Filled 2023-05-17: qty 1

## 2023-05-17 MED ORDER — PROPOFOL 10 MG/ML IV BOLUS
INTRAVENOUS | Status: AC
  Filled 2023-05-17: qty 20

## 2023-05-17 MED ORDER — CHLORHEXIDINE GLUCONATE CLOTH 2 % EX PADS: 6.0000 | MEDICATED_PAD | Freq: Once | CUTANEOUS | Status: DC

## 2023-05-17 MED ORDER — FENTANYL CITRATE (PF) 250 MCG/5ML IJ SOLN
INTRAMUSCULAR | Status: DC | PRN
  Administered 2023-05-17: 100 ug via INTRAVENOUS

## 2023-05-17 MED ORDER — CEFAZOLIN SODIUM-DEXTROSE 1-4 GM/50ML-% IV SOLN
1.0000 g | Freq: Three times a day (TID) | INTRAVENOUS | Status: AC
  Administered 2023-05-17 (×2): 1 g via INTRAVENOUS
  Filled 2023-05-17 (×2): qty 50

## 2023-05-17 MED ORDER — ACETAMINOPHEN 650 MG RE SUPP: 650.0000 mg | RECTAL | Status: DC | PRN

## 2023-05-17 MED ORDER — MIDAZOLAM HCL 2 MG/2ML IJ SOLN
INTRAMUSCULAR | Status: DC | PRN
  Administered 2023-05-17: 2 mg via INTRAVENOUS

## 2023-05-17 MED ORDER — ACETAMINOPHEN 325 MG PO TABS: 650.0000 mg | ORAL_TABLET | ORAL | Status: DC | PRN

## 2023-05-17 MED ORDER — CEFAZOLIN SODIUM-DEXTROSE 2-4 GM/100ML-% IV SOLN
2.0000 g | INTRAVENOUS | Status: AC
  Administered 2023-05-17: 2 g via INTRAVENOUS
  Filled 2023-05-17: qty 100

## 2023-05-17 MED ORDER — OXYCODONE HCL 5 MG PO TABS
5.0000 mg | ORAL_TABLET | Freq: Once | ORAL | Status: AC | PRN
  Administered 2023-05-17: 5 mg via ORAL

## 2023-05-17 MED ORDER — LIDOCAINE 2% (20 MG/ML) 5 ML SYRINGE
INTRAMUSCULAR | Status: DC | PRN
  Administered 2023-05-17: 100 mg via INTRAVENOUS

## 2023-05-17 MED ORDER — DILTIAZEM HCL ER COATED BEADS 240 MG PO CP24
240.0000 mg | ORAL_CAPSULE | Freq: Every day | ORAL | Status: DC
Start: 2023-05-18 — End: 2023-05-18
  Filled 2023-05-17: qty 1

## 2023-05-17 MED ORDER — VANCOMYCIN HCL 1000 MG IV SOLR
INTRAVENOUS | Status: AC
  Filled 2023-05-17: qty 20

## 2023-05-17 MED ORDER — ACETAMINOPHEN 500 MG PO TABS
1000.0000 mg | ORAL_TABLET | Freq: Once | ORAL | Status: DC
  Filled 2023-05-17: qty 2

## 2023-05-17 MED ORDER — ROSUVASTATIN CALCIUM 20 MG PO TABS: 20.0000 mg | ORAL_TABLET | Freq: Every day | ORAL | Status: DC

## 2023-05-17 MED ORDER — BUPIVACAINE HCL (PF) 0.25 % IJ SOLN
INTRAMUSCULAR | Status: AC
Start: 2023-05-17 — End: ?
  Filled 2023-05-17: qty 30

## 2023-05-17 MED ORDER — DEXAMETHASONE SODIUM PHOSPHATE 10 MG/ML IJ SOLN
INTRAMUSCULAR | Status: DC | PRN
  Administered 2023-05-17: 10 mg via INTRAVENOUS

## 2023-05-17 MED ORDER — ASPIRIN 81 MG PO TBEC
81.0000 mg | DELAYED_RELEASE_TABLET | Freq: Every day | ORAL | Status: DC
  Administered 2023-05-18: 81 mg via ORAL
  Filled 2023-05-17: qty 1

## 2023-05-17 MED ORDER — MENTHOL 3 MG MT LOZG: 1.0000 | LOZENGE | OROMUCOSAL | Status: DC | PRN

## 2023-05-17 MED ORDER — ROCURONIUM BROMIDE 10 MG/ML (PF) SYRINGE
PREFILLED_SYRINGE | INTRAVENOUS | Status: DC | PRN
  Administered 2023-05-17: 20 mg via INTRAVENOUS
  Administered 2023-05-17: 70 mg via INTRAVENOUS

## 2023-05-17 MED ORDER — ONDANSETRON HCL 4 MG/2ML IJ SOLN: 4.0000 mg | Freq: Four times a day (QID) | INTRAMUSCULAR | Status: DC | PRN

## 2023-05-17 MED ORDER — FENTANYL CITRATE (PF) 100 MCG/2ML IJ SOLN
INTRAMUSCULAR | Status: AC
  Filled 2023-05-17: qty 2

## 2023-05-17 MED ORDER — 0.9 % SODIUM CHLORIDE (POUR BTL) OPTIME
TOPICAL | Status: DC | PRN
  Administered 2023-05-17: 1000 mL

## 2023-05-17 MED ORDER — DIAZEPAM 5 MG PO TABS
5.0000 mg | ORAL_TABLET | Freq: Four times a day (QID) | ORAL | Status: DC | PRN
  Administered 2023-05-17: 5 mg via ORAL
  Filled 2023-05-17 (×2): qty 1

## 2023-05-17 MED ORDER — MIDAZOLAM HCL 2 MG/2ML IJ SOLN
INTRAMUSCULAR | Status: AC
  Filled 2023-05-17: qty 2

## 2023-05-17 MED ORDER — HYDROMORPHONE HCL 1 MG/ML IJ SOLN: 1.0000 mg | INTRAMUSCULAR | Status: DC | PRN

## 2023-05-17 MED ORDER — LACTATED RINGERS IV SOLN: INTRAVENOUS | Status: DC | PRN

## 2023-05-17 MED ORDER — ONDANSETRON HCL 4 MG PO TABS: 4.0000 mg | ORAL_TABLET | Freq: Four times a day (QID) | ORAL | Status: DC | PRN

## 2023-05-17 MED ORDER — FENTANYL CITRATE (PF) 100 MCG/2ML IJ SOLN
25.0000 ug | INTRAMUSCULAR | Status: DC | PRN
  Administered 2023-05-17 (×3): 50 ug via INTRAVENOUS

## 2023-05-17 MED ORDER — THROMBIN 20000 UNITS EX SOLR
CUTANEOUS | Status: AC
  Filled 2023-05-17: qty 20000

## 2023-05-17 MED ORDER — PROPOFOL 10 MG/ML IV BOLUS
INTRAVENOUS | Status: DC | PRN
  Administered 2023-05-17: 100 mg via INTRAVENOUS

## 2023-05-17 MED ORDER — ALBUTEROL SULFATE HFA 108 (90 BASE) MCG/ACT IN AERS: 1.0000 | INHALATION_SPRAY | Freq: Four times a day (QID) | RESPIRATORY_TRACT | Status: DC | PRN

## 2023-05-17 MED ORDER — HYDROCODONE-ACETAMINOPHEN 10-325 MG PO TABS
1.0000 | ORAL_TABLET | ORAL | Status: DC | PRN
  Administered 2023-05-17 – 2023-05-18 (×4): 1 via ORAL
  Filled 2023-05-17 (×4): qty 1

## 2023-05-17 MED ORDER — BISACODYL 10 MG RE SUPP: 10.0000 mg | Freq: Every day | RECTAL | Status: DC | PRN

## 2023-05-17 MED ORDER — FLECAINIDE ACETATE 50 MG PO TABS: 25.0000 mg | ORAL_TABLET | Freq: Two times a day (BID) | ORAL | Status: DC | PRN

## 2023-05-17 MED ORDER — CHLORHEXIDINE GLUCONATE 0.12 % MT SOLN
15.0000 mL | Freq: Once | OROMUCOSAL | Status: DC
  Filled 2023-05-17: qty 15

## 2023-05-17 MED ORDER — ONDANSETRON HCL 4 MG/2ML IJ SOLN
INTRAMUSCULAR | Status: DC | PRN
  Administered 2023-05-17: 4 mg via INTRAVENOUS

## 2023-05-17 MED ORDER — THROMBIN 20000 UNITS EX SOLR
CUTANEOUS | Status: DC | PRN
  Administered 2023-05-17: 20 mL via TOPICAL

## 2023-05-17 MED ORDER — SODIUM CHLORIDE 0.9% FLUSH: 3.0000 mL | Freq: Two times a day (BID) | INTRAVENOUS | Status: DC

## 2023-05-17 MED ORDER — POLYETHYLENE GLYCOL 3350 17 G PO PACK
17.0000 g | PACK | Freq: Every day | ORAL | Status: DC | PRN
Start: 2023-05-17 — End: 2023-05-18

## 2023-05-17 MED ORDER — SODIUM CHLORIDE 0.9% FLUSH
10.0000 mL | Freq: Two times a day (BID) | INTRAVENOUS | Status: DC
Start: 2023-05-17 — End: 2023-05-18
  Administered 2023-05-17: 10 mL via INTRAVENOUS

## 2023-05-17 MED ORDER — CHLORHEXIDINE GLUCONATE 0.12 % MT SOLN
15.0000 mL | Freq: Once | OROMUCOSAL | Status: AC
  Administered 2023-05-17: 15 mL via OROMUCOSAL

## 2023-05-17 MED ORDER — BUPIVACAINE HCL (PF) 0.25 % IJ SOLN
INTRAMUSCULAR | Status: DC | PRN
  Administered 2023-05-17: 20 mL

## 2023-05-17 MED ORDER — AMISULPRIDE (ANTIEMETIC) 5 MG/2ML IV SOLN: 10.0000 mg | Freq: Once | INTRAVENOUS | Status: DC | PRN

## 2023-05-17 MED ORDER — FENTANYL CITRATE (PF) 250 MCG/5ML IJ SOLN
INTRAMUSCULAR | Status: AC
  Filled 2023-05-17: qty 5

## 2023-05-17 MED ORDER — ORAL CARE MOUTH RINSE: 15.0000 mL | Freq: Once | OROMUCOSAL | Status: AC

## 2023-05-17 MED ORDER — VANCOMYCIN HCL 1000 MG IV SOLR
INTRAVENOUS | Status: DC | PRN
  Administered 2023-05-17: 1000 mg via TOPICAL

## 2023-05-17 MED ORDER — PHENOL 1.4 % MT LIQD: 1.0000 | OROMUCOSAL | Status: DC | PRN

## 2023-05-17 MED ORDER — ORAL CARE MOUTH RINSE: 15.0000 mL | Freq: Once | OROMUCOSAL | Status: DC

## 2023-05-17 MED ORDER — OXYCODONE HCL 5 MG/5ML PO SOLN: 5.0000 mg | Freq: Once | ORAL | Status: AC | PRN

## 2023-05-17 MED ORDER — PANTOPRAZOLE SODIUM 40 MG PO TBEC: 40.0000 mg | DELAYED_RELEASE_TABLET | Freq: Every day | ORAL | Status: DC

## 2023-05-17 MED ORDER — FLEET ENEMA RE ENEM: 1.0000 | ENEMA | Freq: Once | RECTAL | Status: DC | PRN

## 2023-05-17 MED ORDER — OXYCODONE HCL 5 MG PO TABS
10.0000 mg | ORAL_TABLET | ORAL | Status: DC | PRN
  Administered 2023-05-17: 10 mg via ORAL
  Filled 2023-05-17: qty 2

## 2023-05-17 SURGICAL SUPPLY — 59 items
ADH SKN CLS APL DERMABOND .7 (GAUZE/BANDAGES/DRESSINGS) ×1
APL SKNCLS STERI-STRIP NONHPOA (GAUZE/BANDAGES/DRESSINGS) ×1
BAG COUNTER SPONGE SURGICOUNT (BAG) ×1 IMPLANT
BAG SPNG CNTER NS LX DISP (BAG) ×1
BENZOIN TINCTURE PRP APPL 2/3 (GAUZE/BANDAGES/DRESSINGS) ×1 IMPLANT
BLADE BONE MILL MEDIUM (MISCELLANEOUS) ×1 IMPLANT
BLADE CLIPPER SURG (BLADE) IMPLANT
BUR MATCHSTICK NEURO 3.0 LAGG (BURR) ×1 IMPLANT
CAGE EXP CATALYFT 9 (Plate) IMPLANT
CANISTER SUCT 3000ML PPV (MISCELLANEOUS) ×1 IMPLANT
CAP LCK SPNE (Orthopedic Implant) ×4 IMPLANT
CAP LOCK SPINE RADIUS (Orthopedic Implant) IMPLANT
CAP LOCKING (Orthopedic Implant) ×4 IMPLANT
CNTNR URN SCR LID CUP LEK RST (MISCELLANEOUS) ×1 IMPLANT
CONT SPEC 4OZ STRL OR WHT (MISCELLANEOUS) ×1
COVER BACK TABLE 60X90IN (DRAPES) ×1 IMPLANT
DERMABOND ADVANCED .7 DNX12 (GAUZE/BANDAGES/DRESSINGS) ×1 IMPLANT
DRAPE C-ARM 42X72 X-RAY (DRAPES) ×2 IMPLANT
DRAPE HALF SHEET 40X57 (DRAPES) IMPLANT
DRAPE LAPAROTOMY 100X72X124 (DRAPES) ×1 IMPLANT
DRAPE SURG 17X23 STRL (DRAPES) ×4 IMPLANT
DRSG OPSITE POSTOP 4X6 (GAUZE/BANDAGES/DRESSINGS) ×1 IMPLANT
DURAPREP 26ML APPLICATOR (WOUND CARE) ×1 IMPLANT
ELECT REM PT RETURN 9FT ADLT (ELECTROSURGICAL) ×1
ELECTRODE REM PT RTRN 9FT ADLT (ELECTROSURGICAL) ×1 IMPLANT
EVACUATOR 1/8 PVC DRAIN (DRAIN) IMPLANT
GAUZE 4X4 16PLY ~~LOC~~+RFID DBL (SPONGE) IMPLANT
GAUZE SPONGE 4X4 12PLY STRL (GAUZE/BANDAGES/DRESSINGS) IMPLANT
GLOVE BIO SURGEON STRL SZ 6.5 (GLOVE) ×1 IMPLANT
GLOVE BIOGEL PI IND STRL 6.5 (GLOVE) ×1 IMPLANT
GLOVE ECLIPSE 9.0 STRL (GLOVE) ×2 IMPLANT
GLOVE EXAM NITRILE XL STR (GLOVE) IMPLANT
GOWN STRL REUS W/ TWL LRG LVL3 (GOWN DISPOSABLE) IMPLANT
GOWN STRL REUS W/ TWL XL LVL3 (GOWN DISPOSABLE) ×2 IMPLANT
GOWN STRL REUS W/TWL 2XL LVL3 (GOWN DISPOSABLE) IMPLANT
GOWN STRL REUS W/TWL LRG LVL3 (GOWN DISPOSABLE)
GOWN STRL REUS W/TWL XL LVL3 (GOWN DISPOSABLE) ×2
KIT BASIN OR (CUSTOM PROCEDURE TRAY) ×1 IMPLANT
KIT TURNOVER KIT B (KITS) ×1 IMPLANT
NDL HYPO 22X1.5 SAFETY MO (MISCELLANEOUS) ×1 IMPLANT
NEEDLE HYPO 22X1.5 SAFETY MO (MISCELLANEOUS) ×1 IMPLANT
NS IRRIG 1000ML POUR BTL (IV SOLUTION) ×1 IMPLANT
PACK LAMINECTOMY NEURO (CUSTOM PROCEDURE TRAY) ×1 IMPLANT
PUTTY DBF 6CC CORTICAL FIBERS (Putty) IMPLANT
ROD 5.5X60MM GREEN (Rod) IMPLANT
SCREW POLYAXIAL 5.5X45MM (Screw) IMPLANT
SET SCREW (Screw) ×2 IMPLANT
SET SCREW VRST (Screw) IMPLANT
SPIKE FLUID TRANSFER (MISCELLANEOUS) ×1 IMPLANT
SPONGE SURGIFOAM ABS GEL 100 (HEMOSTASIS) ×1 IMPLANT
STRIP CLOSURE SKIN 1/2X4 (GAUZE/BANDAGES/DRESSINGS) ×2 IMPLANT
SUT VIC AB 0 CT1 18XCR BRD8 (SUTURE) ×2 IMPLANT
SUT VIC AB 0 CT1 8-18 (SUTURE) ×1
SUT VIC AB 2-0 CT1 18 (SUTURE) ×1 IMPLANT
SUT VIC AB 3-0 SH 8-18 (SUTURE) ×2 IMPLANT
TOWEL GREEN STERILE (TOWEL DISPOSABLE) ×1 IMPLANT
TOWEL GREEN STERILE FF (TOWEL DISPOSABLE) ×1 IMPLANT
TRAY FOLEY MTR SLVR 16FR STAT (SET/KITS/TRAYS/PACK) ×1 IMPLANT
WATER STERILE IRR 1000ML POUR (IV SOLUTION) ×1 IMPLANT

## 2023-05-17 NOTE — Op Note (Signed)
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  Asst. Surgeon: Doran Durand, NP  Anesthesia: General  Indication: 72 year old female remotely status post L4-5 decompression and fusion with instrumentation presents with severe back and bilateral lower extremity symptoms.  Workup demonstrates evidence of enlarging left-sided L3-4 synovial cyst with marked facet arthropathy and progressive degenerative spondylolisthesis at L3-4.  Fusion appears solid at L4-5.  Patient presents now for laminotomy and resection of synovial cyst with posterior lumbar interbody fusion at L3-4.  Operative note: After induction of anesthesia, patient positioned prone onto Wilson frame appropriate padded.  Lumbar region prepped and draped sterilely.  Incision made from L3-L5.  Dissection performed bilaterally.  Retractor placed.  Fluoroscopy used.  Levels confirmed.  Previously placed pedicle screws potation at L4-5 was identified, disassembled and the rods were removed.  Screws were found to be solid fusion appeared to be solid.  Attention then placed to L3-4.  Decompressive laminotomies and facetectomy was then performed using Leksell rongeurs, Kerrison arteries and the high-speed drill to remove the inferior two thirds of the lamina of L3 bilaterally.  The inferior facet and  pars interarticularis was resected bilaterally.  The majority the superior articular process of L4 was resected bilaterally as was the superior rim of the L4 lamina.  Ligamentum flavum was gently mobilized and resected.  On the left side there was a large synovial cyst.  The microscope was brought into the field used for microdissection as the synovial cyst was carefully dissected free from the thecal sac and left L4 nerve root.  Gross total resection of the cyst was performed.  There was no evidence of injury to the thecal sac or nerve roots.  Bilateral discectomy was then performed at L3-4.  Disc base then prepared for interbody fusion.  With the distractor placed patient's right side.  Disc base was further cleaned of soft tissue.  A 9 mm [employer] expandable cage was then impacted in the place and expanded.  Distractor removed patient's right side.  Disc base prepared on the right side.  Morselized autograft was packed in the interspace as was the second cage.  The second cage was expanded.  Each cage was packed with demineralized bone matrix.  Pedicles of L3 were identified using surface landmarks and intraoperative fluoroscopy's.  Each pedicle was then probed using a pedicle awl.  Each pedicle tract was probed and found to be solidly within the bone.  Each pedicle tract was then tapped with a screw tap.  Screw table was probed and found to be solidly within the bone.  5.5 mm Everest screws from [employer] placed bilaterally at L3.  Final images reveal good position of the cages and the hardware at the proper level with normal alignment of spine.  Gelfoam was placed over the laminotomy defects.  Short segment titanium rod placed over the screw heads at L3-4 and 5.  Locking caps placed over the screws.  Locking caps then engaged.  Transverse processes of L3 and L4 were decorticated.  Morselized autograft was  packed posterolaterally.  Vancomycin powder was placed the deep wound space.  Wounds then closed in  layers with Vicryl sutures.  Steri-Strips and sterile dressing were applied.  No apparent complications.  Patient tolerated the procedure well and she returns to the recovery room postop.

## 2023-05-17 NOTE — Anesthesia Procedure Notes (Signed)
Procedure Name: Intubation Date/Time: 05/17/2023 7:39 AM  Performed by: Orlin Hilding, CRNAPre-anesthesia Checklist: Patient identified, Emergency Drugs available, Suction available, Patient being monitored and Timeout performed Patient Re-evaluated:Patient Re-evaluated prior to induction Oxygen Delivery Method: Circle system utilized Preoxygenation: Pre-oxygenation with 100% oxygen Induction Type: IV induction Ventilation: Mask ventilation without difficulty and Oral airway inserted - appropriate to patient size Laryngoscope Size: Mac and 3 Grade View: Grade I Tube type: Oral Tube size: 7.0 mm Number of attempts: 1 Placement Confirmation: ETT inserted through vocal cords under direct vision, positive ETCO2 and breath sounds checked- equal and bilateral Secured at: 22 cm Tube secured with: Tape Dental Injury: Teeth and Oropharynx as per pre-operative assessment

## 2023-05-17 NOTE — H&P (Signed)
Amy Williamson is an 72 y.o. female.   Chief Complaint: Back pain HPI: 72 year old female little over 1 year status post L4-5 decompression and fusion and has developed worsening back and bilateral lower extremity radicular symptoms.  Workup demonstrates evidence of adjacent level degeneration with a large adjacent level synovial cyst from her left L3-4 facet joint causing severe stenosis.  There is evidence of degenerative spondylolisthesis in addition of the stenosis at this level.  The patient has failed conservative management and presents now for decompression and fusion with resection of synovial cyst.  Past Medical History:  Diagnosis Date   Anticoagulant long-term use    eliquis --- managed by cardiology   Arthritis    BACK AND SHOULDERS   Depressive disorder, not elsewhere classified    Essential hypertension    followed by pcp and cardiology   Frequency of urination    GERD (gastroesophageal reflux disease)    Headache    migraines when younger   History of dysphagia 2015   s/p egd w/ dilation, pt did not have stricture   History of gastritis    OSA (obstructive sleep apnea)    followed by dr t. turner---  study in epic 09-03-2020 moderate osa ,  pt scheduled for cpap titration 12-08-2020- pt wears oral device, cannot wear CPAP   PAF (paroxysmal atrial fibrillation) [facility])    cardiologist-- dr Merlyn Lot---- new onset 06-30-2020 at pcp office visit,  s/p DCCV 07-28-2020 successful   Palpitations 06/2020   11-16-2020  per pt denies palpitations since DCCV 07-28-2020   Plantar fasciitis 08/2017   left foot    Pneumonia    PONV (postoperative nausea and vomiting)    nausea after back surgery, no issues after ablation   Presence of Watchman left atrial appendage closure device 06/28/2022   Watchman FLX 27mm with Dr. Lalla Brothers   Pure hypercholesterolemia     Past Surgical History:  Procedure Laterality Date   ATRIAL FIBRILLATION ABLATION N/A 03/30/2022   Procedure:  ATRIAL FIBRILLATION ABLATION;  Surgeon: Lanier Prude, MD;  Location: [facility] INVASIVE CV LAB;  Service: Cardiovascular;  Laterality: N/A;   BREAST LUMPECTOMY WITH RADIOACTIVE SEED LOCALIZATION Left 06/09/2020   Procedure: LEFT BREAST LUMPECTOMY WITH RADIOACTIVE SEED LOCALIZATION;  Surgeon: Abigail Miyamoto, MD;  Location: [facility];  Service: General;  Laterality: Left;  LMA   CARDIOVERSION N/A 07/28/2020   Procedure: CARDIOVERSION;  Surgeon: Christell Constant, MD;  Location: [facility] ENDOSCOPY;  Service: Cardiovascular;  Laterality: N/A;   CARPAL TUNNEL RELEASE Bilateral right 2004;  left 2006   CATARACT EXTRACTION W/ INTRAOCULAR LENS  IMPLANT, BILATERAL  2016   CESAREAN SECTION  5621,[id number]   BILATERAL TUBAL LIGATION W/ LAST C/S   COLONOSCOPY WITH PROPOFOL  last one 08/ 2013   DILATATION & CURETTAGE/HYSTEROSCOPY WITH MYOSURE N/A 11/22/2020   Procedure: DILATATION & CURETTAGE/HYSTEROSCOPY WITH MYOSURE;  Surgeon: Jerene Bears, MD;  Location: [facility];  Service: Gynecology;  Laterality: N/A;   EAR CYST EXCISION  08/03/2011   patient states this is incorrect was a Baker's cyst popliteal area   ESOPHAGOGASTRODUODENOSCOPY (EGD) WITH ESOPHAGEAL DILATION  08/2013   KNEE ARTHROSCOPY W/ MENISCAL REPAIR Left 08-03-2011  @[facility]    and excision baker's cyst   LEFT ATRIAL APPENDAGE OCCLUSION N/A 06/28/2022   Procedure: LEFT ATRIAL APPENDAGE OCCLUSION;  Surgeon: Lanier Prude, MD;  Location: [facility] INVASIVE CV LAB;  Service: Cardiovascular;  Laterality: N/A;   LUMBAR FUSION  02/19/2022   posterior- lumbar  5-6   PULLEY RELEASE RIGHT TRIGGER FINGER Right 01/08/2011   TEE WITHOUT CARDIOVERSION N/A 06/28/2022   Procedure: TRANSESOPHAGEAL ECHOCARDIOGRAM (TEE);  Surgeon: Lanier Prude, MD;  Location: [facility] INVASIVE CV LAB;  Service: Cardiovascular;  Laterality: N/A;   TRIGGER FINGER RELEASE Bilateral left 05/06/14;  right  02-29-2014    Family History  Problem Relation  Age of Onset   Heart failure Mother    Osteoporosis Mother    Hyperlipidemia Mother    Hypertension Mother    Heart disease Father    Clotting disorder Father    Liver cancer Brother    Liver disease Maternal Aunt        x2   Colon cancer Neg Hx    Social History:  reports that she has never smoked. She has never used smokeless tobacco. She reports that she does not drink alcohol and does not use drugs.  Allergies:  Allergies  Allergen Reactions   Tramadol Itching and Rash    May have been shingles instead of allergy per patient   Atenolol     Decreased blood pressure   Avelox [Moxifloxacin Hcl In Nacl]     Pt did not like side effects   Azithromycin     ?causes muscle weakness, dysphagia   Doxycycline Nausea Only    And heartburn   Myrbetriq [Mirabegron]     Increased blood pressure   Naproxen     Can not take while on Eliquis   Oxybutynin Palpitations    Per pt made her have rapid heart beat    Medications Prior to Admission  Medication Sig Dispense Refill   acetaminophen (TYLENOL) 650 MG CR tablet Take 1,300 mg by mouth in the morning and at bedtime.     aspirin EC 81 MG tablet Take 81 mg by mouth daily. Swallow whole.     Cholecalciferol (VITAMIN D3 PO) Take 1 tablet by mouth daily.     Cyanocobalamin (B-12 PO) Take 1 tablet by mouth daily.     diltiazem (TIAZAC) 240 MG 24 hr capsule Take 240 mg by mouth daily.     pantoprazole (PROTONIX) 40 MG tablet Take 1 tablet (40 mg total) by mouth daily. 100 tablet 3   albuterol (VENTOLIN HFA) 108 (90 Base) MCG/ACT inhaler Inhale 1-2 puffs into the lungs every 6 (six) hours as needed for wheezing or shortness of breath. 1 each 0   diltiazem (CARDIZEM CD) 240 MG 24 hr capsule Take 1 capsule (240 mg total) by mouth daily. (Patient not taking: Reported on 05/01/2023) 90 capsule 3   flecainide (TAMBOCOR) 50 MG tablet Take 0.5 tablets (25 mg total) by mouth 2 (two) times daily as needed (breakthrough afib). 60 tablet 3    rosuvastatin (CRESTOR) 20 MG tablet TAKE 1 TABLET BY MOUTH EVERY DAY (Patient not taking: Reported on 01/02/2023) 90 tablet 3    No results found for this or any previous visit (from the past 48 hour(s)). No results found.  Pertinent items noted in HPI and remainder of comprehensive ROS otherwise negative.  Blood pressure (!) 166/71, pulse 79, temperature 98.1 F (36.7 C), resp. rate 18, height 5' (1.524 m), weight 93.9 kg, last menstrual period 07/23/2004, SpO2 98%.  Patient is awake and alert.  She is oriented and appropriate.  Speech is fluent.  Judgment insight are intact.  Cranial nerve function normal bilateral.  Motor examination reveals some mild weakness in her left quadriceps muscle group and left anterior tibialis.  Otherwise motor strength intact.  Sensory examination with decrease sensation pinprick light touch in her left L4 dermatome.  Deep tendon orifices are normal active.  No evidence of long track signs.  Gait is antalgic.  Posture is mildly flexed peer examination of the head ears eyes nose and throat is unremarked.  Chest and abdomen are benign.  Extremities are free from injury deformity. Assessment/Plan L3-4 adjacent level stenosis secondary to left L3-4 synovial cyst.  Also with degenerative spondylolisthesis.  Plan bilateral L3-4 decompressive laminotomies and foraminotomies with resection of left L3-4 synovial cyst followed by posterior lumbar interbody fusion utilizing interbody cages, local harvested autograft, and augmented with posterior arthrodesis utilizing nonsegmental pedicle screw fixation.  Risks and benefits been explained.  Patient wishes to proceed.  Kathaleen Maser Mccormick [place] 05/17/2023, 7:21 AM

## 2023-05-17 NOTE — Progress Notes (Signed)
Orthopedic Tech Progress Note Patient Details:  Amy Williamson Dec 03, 1950 [id number]  Patient ID: [name], female   DOB: [birth year], 72 y.o.   MRN: [id number] Nurse stated pt. Already has back brace. Tonye Pearson 05/17/2023, 11:46 AM

## 2023-05-17 NOTE — Transfer of Care (Signed)
Immediate Anesthesia Transfer of Care Note  Patient: Amy Williamson  Procedure(s) Performed: Posterior Lumbar Interbody Fusion - Lumbar Three-Lumbar Four - Posterior Lateral and Interbody fusion (Back)  Patient Location: PACU  Anesthesia Type:General  Level of Consciousness: awake, oriented, sedated, and patient cooperative  Airway & Oxygen Therapy: Patient Spontanous Breathing and Patient connected to face mask oxygen  Post-op Assessment: Report given to RN and Post -op Vital signs reviewed and stable  Post vital signs: Reviewed and stable  Last Vitals:  Vitals Value Taken Time  BP 109/52 05/17/23 1001  Temp    Pulse 67 05/17/23 1004  Resp 16 05/17/23 1004  SpO2 92 % 05/17/23 1004  Vitals shown include unfiled device data.  Last Pain:  Vitals:   05/17/23 0601  PainSc: 3          Complications: No notable events documented.

## 2023-05-17 NOTE — Brief Op Note (Signed)
05/17/2023  9:46 AM  PATIENT:  Amy Williamson  72 y.o. female  PRE-OPERATIVE DIAGNOSIS:  Spondylolisthesis  POST-OPERATIVE DIAGNOSIS:  Spondylolisthesis  PROCEDURE:  Procedure(s): Posterior Lumbar Interbody Fusion - Lumbar Three-Lumbar Four - Posterior Lateral and Interbody fusion (N/A)  SURGEON:  Surgeons and Role:    Julio Sicks, MD - Primary  PHYSICIAN ASSISTANT:   ASSISTANTSMarland Mcalpine   ANESTHESIA:   general  EBL:  200 mL   BLOOD ADMINISTERED:none  DRAINS: none   LOCAL MEDICATIONS USED:  MARCAINE     SPECIMEN:  No Specimen  DISPOSITION OF SPECIMEN:  N/A  COUNTS:  YES  TOURNIQUET:  * No tourniquets in log *  DICTATION: .Dragon Dictation  PLAN OF CARE: Admit for overnight observation  PATIENT DISPOSITION:  PACU - hemodynamically stable.   Delay start of Pharmacological VTE agent (>24hrs) due to surgical blood loss or risk of bleeding: yes

## 2023-05-17 NOTE — Anesthesia Postprocedure Evaluation (Signed)
Anesthesia Post Note  Patient: LEE-ANN CAVAZOS  Procedure(s) Performed: Posterior Lumbar Interbody Fusion - Lumbar Three-Lumbar Four - Posterior Lateral and Interbody fusion (Back)     Patient location during evaluation: PACU Anesthesia Type: General Level of consciousness: awake Pain management: pain level controlled Vital Signs Assessment: post-procedure vital signs reviewed and stable Respiratory status: spontaneous breathing, nonlabored ventilation and respiratory function stable Cardiovascular status: blood pressure returned to baseline and stable Postop Assessment: no apparent nausea or vomiting Anesthetic complications: no   No notable events documented.  Last Vitals:  Vitals:   05/17/23 1100 05/17/23 1128  BP: (!) 123/58 (!) 115/48  Pulse: 63 65  Resp: 14 18  Temp: 36.4 C 36.6 C  SpO2: 98% 94%    Last Pain:  Vitals:   05/17/23 1156  PainSc: 5                  Linton Rump

## 2023-05-18 ENCOUNTER — Other Ambulatory Visit (HOSPITAL_COMMUNITY): Payer: Self-pay

## 2023-05-18 DIAGNOSIS — M7138 Other bursal cyst, other site: Secondary | ICD-10-CM | POA: Diagnosis not present

## 2023-05-18 MED ORDER — BISACODYL 5 MG PO TBEC
5.0000 mg | DELAYED_RELEASE_TABLET | Freq: Every day | ORAL | Status: DC | PRN
  Administered 2023-05-18: 5 mg via ORAL
  Filled 2023-05-18: qty 1

## 2023-05-18 MED ORDER — OXYCODONE-ACETAMINOPHEN 5-325 MG PO TABS
1.0000 | ORAL_TABLET | Freq: Four times a day (QID) | ORAL | 0 refills | Status: DC | PRN
  Filled 2023-05-18: qty 30, 8d supply, fill #0

## 2023-05-18 MED ORDER — DOCUSATE SODIUM 100 MG PO CAPS
100.0000 mg | ORAL_CAPSULE | Freq: Two times a day (BID) | ORAL | Status: DC | PRN
  Administered 2023-05-18: 100 mg via ORAL
  Filled 2023-05-18: qty 1

## 2023-05-18 MED ORDER — METHOCARBAMOL 750 MG PO TABS
750.0000 mg | ORAL_TABLET | Freq: Four times a day (QID) | ORAL | 0 refills | Status: DC
  Filled 2023-05-18: qty 45, 12d supply, fill #0

## 2023-05-18 NOTE — Care Management (Signed)
Patient with order to DC to home today. Unit staff to provide DME needed for home.   No HH needs identified Patient will have family/ friends provide transportation home. No other TOC needs identified for DC 

## 2023-05-18 NOTE — Discharge Summary (Signed)
Physician Discharge Summary  Patient ID: VIENO SUTO MRN: [id number] DOB/AGE: 29-Apr-1951 72 y.o.  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 The patient tolerated the procedure well and was taken to the recovery room and then to the floor in stable condition. The hospital course was routine. There were no complications. The wound remained clean dry and intact. Pt had appropriate back soreness. No complaints of leg pain or new N/T/W. The patient remained afebrile with stable vital signs, and tolerated a regular diet. The patient continued to increase activities, and pain was well controlled with oral pain medications.   Consults: None  Significant Diagnostic Studies:  Results for orders placed or performed during the hospital encounter of 05/09/23  Surgical pcr screen   Specimen: Nasal Mucosa; Nasal Swab  Result Value Ref Range   MRSA, PCR NEGATIVE NEGATIVE   Staphylococcus aureus NEGATIVE NEGATIVE  Basic metabolic panel per protocol  Result Value Ref Range   Sodium 137 135 - 145 mmol/L   Potassium 4.3 3.5 - 5.1 mmol/L   Chloride 103 98 - 111 mmol/L   CO2 27 22 - 32 mmol/L   Glucose, Bld 106 (H) 70 - 99 mg/dL   BUN 16 8 - 23 mg/dL   Creatinine, Ser 4.09 0.44 - 1.00 mg/dL   Calcium 9.4 8.9 - 81.1 mg/dL   GFR, Estimated >91 >47 mL/min   Anion gap 7 5 - 15  CBC per protocol  Result Value Ref Range   WBC 10.0 4.0 - 10.5 K/uL   RBC 4.75 3.87 - 5.11 MIL/uL   Hemoglobin 13.3 12.0 - 15.0 g/dL   HCT 82.9 56.2 - 13.0 %   MCV 88.0 80.0 - 100.0 fL   MCH 28.0 26.0 - 34.0 pg   MCHC 31.8 30.0 - 36.0 g/dL   RDW 86.5 78.4 - 69.6 %   Platelets 254 150 - 400 K/uL   nRBC 0.0 0.0 - 0.2 %  Type and screen   Result Value Ref Range   ABO/RH(D) O POS    Antibody Screen NEG    Sample Expiration 05/23/2023,2359    Extend sample reason      NO TRANSFUSIONS OR PREGNANCY IN THE PAST 3 MONTHS Performed at [facility], [address] [place], [place] [address]     DG Lumbar Spine 2-3 Views  Result Date: 05/17/2023 CLINICAL DATA:  Posterior lumbar interbody fusion, L3-L4 EXAM: LUMBAR SPINE - 2-3 VIEW COMPARISON:  12/27/2022 FINDINGS: Two fluoroscopic images are obtained during the performance of the procedure and are provided for interpretation only. Images demonstrate extension of previously noted posterior lumbar fusion at L4-L5 to now include the L3-L4 level. Interbody disc spacers in place. Fluoroscopy time: 23 seconds Cumulative air kerma: 14.82 mGy IMPRESSION: Intraoperative fluoroscopic guidance for posterior lumbar interbody fusion, L3-L4. Electronically Signed   By: Wiliam Ke M.D.   On: 05/17/2023 13:22   DG C-Arm 1-60 Min-No Report  Result Date: 05/17/2023 Fluoroscopy was utilized by the requesting physician.  No radiographic interpretation.    Antibiotics:  Anti-infectives (From admission, onward)    Start     Dose/Rate Route Frequency Ordered Stop   05/17/23 1500  ceFAZolin (ANCEF) IVPB 1 g/50 mL premix  1 g 100 mL/hr over 30 Minutes Intravenous Every 8 hours 05/17/23 1115 05/18/23 0756   05/17/23 0939  vancomycin (VANCOCIN) powder  Status:  Discontinued          As needed 05/17/23 0939 05/17/23 0957   05/17/23 0600  ceFAZolin (ANCEF) IVPB 2g/100 mL premix        2 g 200 mL/hr over 30 Minutes Intravenous On call to O.R. 05/17/23 0546 05/17/23 0742       Discharge Exam: Blood pressure 130/81, pulse 98, temperature 98.6 F (37 C), temperature source Oral, resp. rate 20, height 5' (1.524 m), weight 93.9 kg, last menstrual period 07/23/2004, SpO2 98%. Neurologic: Grossly normal Ambulating and voiding well incision cdi   Discharge Medications:   Allergies as of  05/18/2023       Reactions   Tramadol Itching, Rash   May have been shingles instead of allergy per patient   Atenolol    Decreased blood pressure   Avelox [moxifloxacin Hcl In Nacl]    Pt did not like side effects   Azithromycin    ?causes muscle weakness, dysphagia   Doxycycline Nausea Only   And heartburn   Myrbetriq [mirabegron]    Increased blood pressure   Naproxen    Can not take while on Eliquis   Oxybutynin Palpitations   Per pt made her have rapid heart beat        Medication List     TAKE these medications    acetaminophen 650 MG CR tablet Commonly known as: TYLENOL Take 1,300 mg by mouth in the morning and at bedtime.   albuterol 108 (90 Base) MCG/ACT inhaler Commonly known as: VENTOLIN HFA Inhale 1-2 puffs into the lungs every 6 (six) hours as needed for wheezing or shortness of breath.   aspirin EC 81 MG tablet Take 81 mg by mouth daily. Swallow whole.   B-12 PO Take 1 tablet by mouth daily.   diltiazem 240 MG 24 hr capsule Commonly known as: CARDIZEM CD Take 1 capsule (240 mg total) by mouth daily.   diltiazem 240 MG 24 hr capsule Commonly known as: TIAZAC Take 240 mg by mouth daily.   flecainide 50 MG tablet Commonly known as: TAMBOCOR Take 0.5 tablets (25 mg total) by mouth 2 (two) times daily as needed (breakthrough afib).   methocarbamol 750 MG tablet Commonly known as: Robaxin-750 Take 1 tablet (750 mg total) by mouth 4 (four) times daily.   oxyCODONE-acetaminophen 5-325 MG tablet Commonly known as: PERCOCET/ROXICET Take 1 tablet by mouth every 6 (six) hours as needed for severe pain (pain score 7-10).   pantoprazole 40 MG tablet Commonly known as: PROTONIX Take 1 tablet (40 mg total) by mouth daily.   rosuvastatin 20 MG tablet Commonly known as: CRESTOR TAKE 1 TABLET BY MOUTH EVERY DAY   VITAMIN D3 PO Take 1 tablet by mouth daily.               Durable Medical Equipment  (From admission, onward)            Start     Ordered   05/17/23 1116  DME Walker rolling  Once       Question:  Patient needs a walker to treat with the following condition  Answer:  Synovial cyst of lumbar facet joint   05/17/23 1115   05/17/23 1116  DME 3 n 1  Once        05/17/23 1115  Disposition: home   Final Dx: PLIF L3-4  Discharge Instructions      Remove dressing in 72 hours   Complete by: As directed    Call MD for:  difficulty breathing, headache or visual disturbances   Complete by: As directed    Call MD for:  hives   Complete by: As directed    Call MD for:  persistant dizziness or light-headedness   Complete by: As directed    Call MD for:  persistant nausea and vomiting   Complete by: As directed    Call MD for:  redness, tenderness, or signs of infection (pain, swelling, redness, odor or green/yellow discharge around incision site)   Complete by: As directed    Call MD for:  severe uncontrolled pain   Complete by: As directed    Call MD for:  temperature >100.4   Complete by: As directed    Diet - low sodium heart healthy   Complete by: As directed    Driving Restrictions   Complete by: As directed    No driving for 2 weeks, no riding in the car for 1 week   Increase activity slowly   Complete by: As directed    Lifting restrictions   Complete by: As directed    No lifting more than 8 lbs          Signed: Tiana Loft Fidela Cieslak 05/18/2023, 8:38 AM

## 2023-05-18 NOTE — Evaluation (Signed)
Occupational Therapy Evaluation and Discharge Patient Details Name: Amy Williamson MRN: [id number] DOB: 23-Jul-1951 Today's Date: 05/18/2023   History of Present Illness 72 year old female with worsening back and bilateral lower extremity radicular symptoms and now s/p bilateral L3-4 decompressive laminotomies and foraminotomies, L3-4 posterior lumbar interbody fusion, and L3-4 posterior lateral arthrodesis. PHMx: L 4-5 decompressive laminectomies/foraminectomies, GERD, oSA, essential HTN, OA   Clinical Impression   This 72 yo female admitted and underwent above presents to acute OT with all education completed, we will D/C from acute OT.       If plan is discharge home, recommend the following: Assistance with cooking/housework;Assist for transportation    Functional Status Assessment  Patient has had a recent decline in their functional status and demonstrates the ability to make significant improvements in function in a reasonable and predictable amount of time. (but without further OT needs, all education completed and post op back handout provided)  Equipment Recommendations  None recommended by OT       Precautions / Restrictions Precautions Precautions: Back Precaution Booklet Issued: Yes (comment) Required Braces or Orthoses: Spinal Brace Spinal Brace: Lumbar corset Restrictions Weight Bearing Restrictions: No      Mobility Bed Mobility Overal bed mobility: Modified Independent                  Transfers Overall transfer level: Modified independent Equipment used: Rolling walker (2 wheels)                      Balance Overall balance assessment: Mild deficits observed, not formally tested                                         ADL either performed or assessed with clinical judgement   ADL Overall ADL's : Modified independent                                       General ADL Comments: Educated on sequence of  dressing, use of 2 cups for brushing teeth, use of wet wipes for back peri care, building up sitting tolerance, use of reacher and sock aid for LBD     Vision Patient Visual Report: No change from baseline              Pertinent Vitals/Pain Pain Assessment Pain Assessment: 0-10 Pain Score: 4  Pain Location: lower back Pain Descriptors / Indicators: Throbbing, Tightness Pain Intervention(s): Limited activity within patient's tolerance, Monitored during session, Repositioned, Patient requesting pain meds-RN notified, RN gave pain meds during session     Extremity/Trunk Assessment Upper Extremity Assessment Upper Extremity Assessment: Overall WFL for tasks assessed           Communication Communication Communication: No apparent difficulties   Cognition Arousal: Alert Behavior During Therapy: WFL for tasks assessed/performed Overall Cognitive Status: Within Functional Limits for tasks assessed                                                  Home Living Family/patient expects to be discharged to:: Private residence Living Arrangements: Spouse/significant other Available Help at Discharge: Family;Available 24 hours/day Type of Home: House Home  Access: Stairs to enter [employer] of Steps: 3-4 Entrance Stairs-Rails: Right;Left Home Layout: One level     Bathroom Shower/Tub: Walk-in shower;Curtain   Bathroom Toilet: Handicapped height     Home Equipment: Hand held shower head;Shower seat - built in;Toilet riser;Rolling Walker (2 wheels);Cane - single point (adjustable bed)          Prior Functioning/Environment Prior Level of Function : Independent/Modified Independent;Driving                        OT Problem List: Decreased strength;Decreased range of motion;Impaired balance (sitting and/or standing);Pain         OT Goals(Current goals can be found in the care plan section) Acute Rehab OT Goals Patient Stated Goal:  home today         AM-PAC OT "6 Clicks" Daily Activity     Outcome Measure Help from another person eating meals?: None Help from another person taking care of personal grooming?: None Help from another person toileting, which includes using toliet, bedpan, or urinal?: None Help from another person bathing (including washing, rinsing, drying)?: None Help from another person to put on and taking off regular upper body clothing?: None Help from another person to put on and taking off regular lower body clothing?: None 6 Click Score: 24   End of Session Equipment Utilized During Treatment: Rolling walker (2 wheels);Back brace Nurse Communication:  (no further OT needs)  Activity Tolerance: Patient tolerated treatment well Patient left: in chair;with call bell/phone within reach  OT Visit Diagnosis: Unsteadiness on feet (R26.81);Muscle weakness (generalized) (M62.81);Pain Pain - part of body:  (low back)                Time: 4332-9518 OT Time Calculation (min): 26 min Charges:  OT General Charges $OT Visit: 1 Visit OT Evaluation $OT Eval Moderate Complexity: 1 Mod OT Treatments $Self Care/Home Management : 8-22 mins Lindon Romp OT Acute Rehabilitation Services Office [phone]    Evette Georges 05/18/2023, 8:46 AM

## 2023-05-18 NOTE — Evaluation (Signed)
Physical Therapy Evaluation  Patient Details Name: Amy Williamson MRN: [id number] DOB: [birth year] Today's Date: 05/18/2023  History of Present Illness  72 year old female with worsening back and bilateral lower extremity radicular symptoms and now s/p bilateral L3-4 decompressive laminotomies and foraminotomies, L3-4 posterior lumbar interbody fusion, and L3-4 posterior lateral arthrodesis. PHMx: L 4-5 decompressive laminectomies/foraminectomies, GERD, oSA, essential HTN, OA   Clinical Impression  Pt admitted with above diagnosis. At the time of PT eval, pt was able to demonstrate transfers and ambulation with gross CGA to modified independence and RW for support. Pt was educated on precautions, brace application/wearing schedule, appropriate activity progression, and car transfer. Pt currently with functional limitations due to the deficits listed below (see PT Problem List). Pt will benefit from skilled PT to increase their independence and safety with mobility to allow discharge to the venue listed below.          If plan is discharge home, recommend the following: A little help with walking and/or transfers;A little help with bathing/dressing/bathroom;Assistance with cooking/housework;Assist for transportation;Help with stairs or ramp for entrance   Can travel by private vehicle        Equipment Recommendations None recommended by PT  Recommendations for Other Services       Functional Status Assessment Patient has had a recent decline in their functional status and demonstrates the ability to make significant improvements in function in a reasonable and predictable amount of time.     Precautions / Restrictions Precautions Precautions: Back Precaution Booklet Issued: Yes (comment) Required Braces or Orthoses: Spinal Brace Spinal Brace: Lumbar corset;Applied in sitting position Restrictions Weight Bearing Restrictions: No      Mobility  Bed Mobility                General bed mobility comments: Pt was received sitting up in the recliner.    Transfers Overall transfer level: Modified independent Equipment used: Rolling walker (2 wheels)               General transfer comment: Pt demonstrated proper hand placement on seated surface for safety.    Ambulation/Gait Ambulation/Gait assistance: Supervision, Contact guard assist Gait Distance (Feet): 350 Feet Assistive device: Rolling walker (2 wheels) Gait Pattern/deviations: Step-through pattern, Decreased stride length, Trunk flexed Gait velocity: Decreased Gait velocity interpretation: 1.31 - 2.62 ft/sec, indicative of limited community ambulator   General Gait Details: VC's for improved posture, closer walker proximity and forward gaze. No assist required and no gross unsteadiness noted. Progressed from CGA to supervision by end of session.  Stairs Stairs: Yes Stairs assistance: Min assist Stair Management: One rail Right, Step to pattern, Forwards Number of Stairs: 4 General stair comments: VC's for sequencing and general safety. Light assist for power up to next step.  Wheelchair Mobility     Tilt Bed    Modified Rankin (Stroke Patients Only)       Balance Overall balance assessment: Mild deficits observed, not formally tested                                           Pertinent Vitals/Pain Pain Assessment Pain Assessment: 0-10 Pain Score: 5  Pain Location: lower back Pain Descriptors / Indicators: Throbbing, Tightness Pain Intervention(s): Limited activity within patient's tolerance, Monitored during session, Repositioned    Home Living Family/patient expects to be discharged to:: Private residence Living Arrangements: Spouse/significant other Available Help  at Discharge: Family;Available 24 hours/day Type of Home: House Home Access: Stairs to enter Entrance Stairs-Rails: Doctor, general practice of Steps: 3-4   Home Layout: One  level Home Equipment: Hand held shower head;Shower seat - built in;Toilet riser;Rolling Walker (2 wheels);Cane - single point (adjustable bed)      Prior Function Prior Level of Function : Independent/Modified Independent;Driving                     Extremity/Trunk Assessment   Upper Extremity Assessment Upper Extremity Assessment: Defer to OT evaluation    Lower Extremity Assessment Lower Extremity Assessment: Generalized weakness (Mild; consistent with pre-op diagnosis)       Communication   Communication Communication: No apparent difficulties  Cognition Arousal: Alert Behavior During Therapy: WFL for tasks assessed/performed Overall Cognitive Status: Within Functional Limits for tasks assessed                                          General Comments      Exercises     Assessment/Plan    PT Assessment Patient needs continued PT services  PT Problem List Decreased strength;Decreased activity tolerance;Decreased balance;Decreased mobility;Decreased knowledge of use of DME;Decreased knowledge of precautions;Decreased safety awareness;Pain       PT Treatment Interventions DME instruction;Gait training;Stair training;Functional mobility training;Therapeutic activities;Therapeutic exercise;Balance training;Neuromuscular re-education;Patient/family education    PT Goals (Current goals can be found in the Care Plan section)  Acute Rehab PT Goals Patient Stated Goal: Home today PT Goal Formulation: With patient Time For Goal Achievement: 05/25/23 Potential to Achieve Goals: Good    Frequency Min 5X/week     Co-evaluation               AM-PAC PT "6 Clicks" Mobility  Outcome Measure Help needed turning from your back to your side while in a flat bed without using bedrails?: A Little Help needed moving from lying on your back to sitting on the side of a flat bed without using bedrails?: A Little Help needed moving to and from a bed to  a chair (including a wheelchair)?: A Little Help needed standing up from a chair using your arms (e.g., wheelchair or bedside chair)?: None Help needed to walk in hospital room?: A Little Help needed climbing 3-5 steps with a railing? : A Little 6 Click Score: 19    End of Session Equipment Utilized During Treatment: Gait belt Activity Tolerance: Patient tolerated treatment well Patient left: in chair;with call bell/phone within reach Nurse Communication: Mobility status PT Visit Diagnosis: Unsteadiness on feet (R26.81);Pain Pain - part of body:  (back)    Time: 8756-4332 PT Time Calculation (min) (ACUTE ONLY): 17 min   Charges:   PT Evaluation $PT Eval Low Complexity: 1 Low   PT General Charges $$ ACUTE PT VISIT: 1 Visit         Conni Slipper, PT, DPT Acute Rehabilitation Services Secure Chat Preferred Office: [phone]   Marylynn Pearson 05/18/2023, 10:02 AM

## 2023-05-18 NOTE — Plan of Care (Signed)
  Problem: Education: Goal: Ability to verbalize activity precautions or restrictions will improve Outcome: Completed/Met Goal: Knowledge of the prescribed therapeutic regimen will improve Outcome: Completed/Met Goal: Understanding of discharge needs will improve Outcome: Completed/Met   Problem: Pain Management: Goal: Pain level will decrease Outcome: Completed/Met   Problem: Skin Integrity: Goal: Will show signs of wound healing Outcome: Completed/Met   Problem: Bladder/Genitourinary: Goal: Urinary functional status for postoperative course will improve Outcome: Completed/Met   Problem: Activity: Goal: Ability to avoid complications of mobility impairment will improve Outcome: Completed/Met Goal: Ability to tolerate increased activity will improve Outcome: Completed/Met Goal: Will remain free from falls Outcome: Completed/Met

## 2023-05-18 NOTE — Progress Notes (Signed)
Patient alert and oriented, void, ambulate. Surgical site clean and dry no sign of infection. D/c instructions explain and given all questions answered.

## 2023-05-23 ENCOUNTER — Ambulatory Visit (HOSPITAL_BASED_OUTPATIENT_CLINIC_OR_DEPARTMENT_OTHER): Payer: Medicare Other | Admitting: Obstetrics & Gynecology

## 2023-05-23 MED FILL — Sodium Chloride IV Soln 0.9%: INTRAVENOUS | Qty: 1000 | Status: AC

## 2023-05-23 MED FILL — Heparin Sodium (Porcine) Inj 1000 Unit/ML: INTRAMUSCULAR | Qty: 30 | Status: AC

## 2023-06-26 ENCOUNTER — Ambulatory Visit (INDEPENDENT_AMBULATORY_CARE_PROVIDER_SITE_OTHER): Payer: Medicare Other | Admitting: *Deleted

## 2023-06-26 DIAGNOSIS — Z0000 Encounter for general adult medical examination without abnormal findings: Secondary | ICD-10-CM | POA: Diagnosis not present

## 2023-06-26 NOTE — Patient Instructions (Signed)
Amy Williamson , Thank you for taking time to come for your Medicare Wellness Visit. I appreciate your ongoing commitment to your health goals. Please review the following plan we discussed and let me know if I can assist you in the future.   Screening recommendations/referrals: Colonoscopy: up to date Mammogram: up to date Bone Density: up to date Recommended yearly ophthalmology/optometry visit for glaucoma screening and checkup Recommended yearly dental visit for hygiene and checkup  Vaccinations: Influenza vaccine: up to date Pneumococcal vaccine: up to date Tdap vaccine: up tod ate Shingles vaccine: Education provided  Advanced directives: on file    Preventive Care 65 Years and Older, Female Preventive care refers to lifestyle choices and visits with your health care provider that can promote health and wellness. What does preventive care include? A yearly physical exam. This is also called an annual well check. Dental exams once or twice a year. Routine eye exams. Ask your health care provider how often you should have your eyes checked. Personal lifestyle choices, including: Daily care of your teeth and gums. Regular physical activity. Eating a healthy diet. Avoiding tobacco and drug use. Limiting alcohol use. Practicing safe sex. Taking low-dose aspirin every day. Taking vitamin and mineral supplements as recommended by your health care provider. What happens during an annual well check? The services and screenings done by your health care provider during your annual well check will depend on your age, overall health, lifestyle risk factors, and family history of disease. Counseling  Your health care provider may ask you questions about your: Alcohol use. Tobacco use. Drug use. Emotional well-being. Home and relationship well-being. Sexual activity. Eating habits. History of falls. Memory and ability to understand (cognition). Work and work  Astronomer. Reproductive health. Screening  You may have the following tests or measurements: Height, weight, and BMI. Blood pressure. Lipid and cholesterol levels. These may be checked every 5 years, or more frequently if you are over 61 years old. Skin check. Lung cancer screening. You may have this screening every year starting at age 73 if you have a 30-pack-year history of smoking and currently smoke or have quit within the past 15 years. Fecal occult blood test (FOBT) of the stool. You may have this test every year starting at age 48. Flexible sigmoidoscopy or colonoscopy. You may have a sigmoidoscopy every 5 years or a colonoscopy every 10 years starting at age 37. Hepatitis C blood test. Hepatitis B blood test. Sexually transmitted disease (STD) testing. Diabetes screening. This is done by checking your blood sugar (glucose) after you have not eaten for a while (fasting). You may have this done every 1-3 years. Bone density scan. This is done to screen for osteoporosis. You may have this done starting at age 75. Mammogram. This may be done every 1-2 years. Talk to your health care provider about how often you should have regular mammograms. Talk with your health care provider about your test results, treatment options, and if necessary, the need for more tests. Vaccines  Your health care provider may recommend certain vaccines, such as: Influenza vaccine. This is recommended every year. Tetanus, diphtheria, and acellular pertussis (Tdap, Td) vaccine. You may need a Td booster every 10 years. Zoster vaccine. You may need this after age 17. Pneumococcal 13-valent conjugate (PCV13) vaccine. One dose is recommended after age 69. Pneumococcal polysaccharide (PPSV23) vaccine. One dose is recommended after age 66. Talk to your health care provider about which screenings and vaccines you need and how often you need  them. This information is not intended to replace advice given to you by  your health care provider. Make sure you discuss any questions you have with your health care provider. Document Released: 08/05/2015 Document Revised: 03/28/2016 Document Reviewed: 05/10/2015 Elsevier Interactive Patient Education  2017 [employer].  Fall Prevention in the Home Falls can cause injuries. They can happen to people of all ages. There are many things you can do to make your home safe and to help prevent falls. What can I do on the outside of my home? Regularly fix the edges of walkways and driveways and fix any cracks. Remove anything that might make you trip as you walk through a door, such as a raised step or threshold. Trim any bushes or trees on the path to your home. Use bright outdoor lighting. Clear any walking paths of anything that might make someone trip, such as rocks or tools. Regularly check to see if handrails are loose or broken. Make sure that both sides of any steps have handrails. Any raised decks and porches should have guardrails on the edges. Have any leaves, snow, or ice cleared regularly. Use sand or salt on walking paths during winter. Clean up any spills in your garage right away. This includes oil or grease spills. What can I do in the bathroom? Use night lights. Install grab bars by the toilet and in the tub and shower. Do not use towel bars as grab bars. Use non-skid mats or decals in the tub or shower. If you need to sit down in the shower, use a plastic, non-slip stool. Keep the floor dry. Clean up any water that spills on the floor as soon as it happens. Remove soap buildup in the tub or shower regularly. Attach bath mats securely with double-sided non-slip rug tape. Do not have throw rugs and other things on the floor that can make you trip. What can I do in the bedroom? Use night lights. Make sure that you have a light by your bed that is easy to reach. Do not use any sheets or blankets that are too big for your bed. They should not hang  down onto the floor. Have a firm chair that has side arms. You can use this for support while you get dressed. Do not have throw rugs and other things on the floor that can make you trip. What can I do in the kitchen? Clean up any spills right away. Avoid walking on wet floors. Keep items that you use a lot in easy-to-reach places. If you need to reach something above you, use a strong step stool that has a grab bar. Keep electrical cords out of the way. Do not use floor polish or wax that makes floors slippery. If you must use wax, use non-skid floor wax. Do not have throw rugs and other things on the floor that can make you trip. What can I do with my stairs? Do not leave any items on the stairs. Make sure that there are handrails on both sides of the stairs and use them. Fix handrails that are broken or loose. Make sure that handrails are as long as the stairways. Check any carpeting to make sure that it is firmly attached to the stairs. Fix any carpet that is loose or worn. Avoid having throw rugs at the top or bottom of the stairs. If you do have throw rugs, attach them to the floor with carpet tape. Make sure that you have a light switch at  the top of the stairs and the bottom of the stairs. If you do not have them, ask someone to add them for you. What else can I do to help prevent falls? Wear shoes that: Do not have high heels. Have rubber bottoms. Are comfortable and fit you well. Are closed at the toe. Do not wear sandals. If you use a stepladder: Make sure that it is fully opened. Do not climb a closed stepladder. Make sure that both sides of the stepladder are locked into place. Ask someone to hold it for you, if possible. Clearly mark and make sure that you can see: Any grab bars or handrails. First and last steps. Where the edge of each step is. Use tools that help you move around (mobility aids) if they are needed. These  include: Canes. Walkers. Scooters. Crutches. Turn on the lights when you go into a dark area. Replace any light bulbs as soon as they burn out. Set up your furniture so you have a clear path. Avoid moving your furniture around. If any of your floors are uneven, fix them. If there are any pets around you, be aware of where they are. Review your medicines with your doctor. Some medicines can make you feel dizzy. This can increase your chance of falling. Ask your doctor what other things that you can do to help prevent falls. This information is not intended to replace advice given to you by your health care provider. Make sure you discuss any questions you have with your health care provider. Document Released: 05/05/2009 Document Revised: 12/15/2015 Document Reviewed: 08/13/2014 Elsevier Interactive Patient Education  2017 [employer].

## 2023-06-26 NOTE — Progress Notes (Signed)
Subjective:   Amy Williamson is a 72 y.o. female who presents for Medicare Annual (Subsequent) preventive examination.  Visit Complete: Virtual I connected with  Amy Williamson on 06/26/23 by a audio enabled telemedicine application and verified that I am speaking with the correct person using two identifiers.  Patient Location: Home  Provider Location: Home Office  I discussed the limitations of evaluation and management by telemedicine. The patient expressed understanding and agreed to proceed.  Vital Signs: Because this visit was a virtual/telehealth visit, some criteria may be missing or patient reported. Any vitals not documented were not able to be obtained and vitals that have been documented are patient reported.    Cardiac Risk Factors include: advanced age (>78men, >56 women)     Objective:    Today's Vitals   06/26/23 1342  PainSc: 3    There is no height or weight on file to calculate BMI.     06/26/2023    1:47 PM 05/09/2023   11:04 AM 07/02/2022    1:07 PM 06/28/2022    4:16 PM 06/28/2022    7:31 AM 03/30/2022    8:53 AM 02/09/2022   11:31 AM  Advanced Directives  Does Patient Have a Medical Advance Directive? Yes Yes Yes Yes Yes Yes Yes  Type of Estate agent of [employer] Power of [place];Living will Healthcare Power of [place];Living will Living will Healthcare Power of [place];Living will Living will;Healthcare Power of [employer] Power of [place];Living will  Does patient want to make changes to medical advance directive?   No - Patient declined Yes (Inpatient - patient defers changing a medical advance directive at this time - Information given)  No - Patient declined No - Patient declined  Copy of Healthcare Power of Attorney in Chart? Yes - validated most recent copy scanned in chart (See row information) Yes - validated most recent copy scanned in chart (See row information) No - copy requested  Yes - validated  most recent copy scanned in chart (See row information)  No - copy requested    Current Medications (verified) Outpatient Encounter Medications as of 06/26/2023  Medication Sig   acetaminophen (TYLENOL) 650 MG CR tablet Take 1,300 mg by mouth in the morning and at bedtime.   aspirin EC 81 MG tablet Take 81 mg by mouth daily. Swallow whole.   Cholecalciferol (VITAMIN D3 PO) Take 1 tablet by mouth daily.   Cyanocobalamin (B-12 PO) Take 1 tablet by mouth daily.   diltiazem (TIAZAC) 240 MG 24 hr capsule Take 240 mg by mouth daily.   flecainide (TAMBOCOR) 50 MG tablet Take 0.5 tablets (25 mg total) by mouth 2 (two) times daily as needed (breakthrough afib).   methocarbamol (ROBAXIN-750) 750 MG tablet Take 1 tablet (750 mg total) by mouth 4 (four) times daily.   pantoprazole (PROTONIX) 40 MG tablet Take 1 tablet (40 mg total) by mouth daily.   albuterol (VENTOLIN HFA) 108 (90 Base) MCG/ACT inhaler Inhale 1-2 puffs into the lungs every 6 (six) hours as needed for wheezing or shortness of breath. (Patient not taking: Reported on 06/26/2023)   diltiazem (CARDIZEM CD) 240 MG 24 hr capsule Take 1 capsule (240 mg total) by mouth daily. (Patient not taking: Reported on 05/01/2023)   oxyCODONE-acetaminophen (PERCOCET/ROXICET) 5-325 MG tablet Take 1 tablet by mouth every 6 (six) hours as needed for severe pain (pain score 7-10). (Patient not taking: Reported on 06/26/2023)   rosuvastatin (CRESTOR) 20 MG tablet TAKE 1 TABLET  BY MOUTH EVERY DAY (Patient not taking: Reported on 01/02/2023)   No facility-administered encounter medications on file as of 06/26/2023.    Allergies (verified) Tramadol, Atenolol, Avelox [moxifloxacin hcl in nacl], Azithromycin, Doxycycline, Myrbetriq [mirabegron], Naproxen, and Oxybutynin   History: Past Medical History:  Diagnosis Date   Anticoagulant long-term use    eliquis --- managed by cardiology   Arthritis    BACK AND SHOULDERS   Depressive disorder, not elsewhere  classified    Essential hypertension    followed by pcp and cardiology   Frequency of urination    GERD (gastroesophageal reflux disease)    Headache    migraines when younger   History of dysphagia 2015   s/p egd w/ dilation, pt did not have stricture   History of gastritis    OSA (obstructive sleep apnea)    followed by dr t. 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 Lalla Brothers   Pure hypercholesterolemia    Past Surgical History:  Procedure Laterality Date   ATRIAL FIBRILLATION ABLATION N/A 03/30/2022   Procedure: ATRIAL FIBRILLATION ABLATION;  Surgeon: Lanier Prude, MD;  [facility] INVASIVE CV LAB;  Service: Cardiovascular;  Laterality: N/A;   BREAST LUMPECTOMY WITH RADIOACTIVE SEED LOCALIZATION Left 06/09/2020   Procedure: LEFT BREAST LUMPECTOMY WITH RADIOACTIVE SEED LOCALIZATION;  Surgeon: Abigail Miyamoto, MD;  Location: [facility];  Service: General;  Laterality: Left;  LMA   CARDIOVERSION N/A 07/28/2020   Procedure: CARDIOVERSION;  Surgeon: Christell Constant, MD;  Location: [facility] ENDOSCOPY;  Service: Cardiovascular;  Laterality: N/A;   CARPAL TUNNEL RELEASE Bilateral right 2004;  left 2006   CATARACT EXTRACTION W/ INTRAOCULAR LENS  IMPLANT, BILATERAL  2016   CESAREAN SECTION  [id number]   BILATERAL TUBAL LIGATION W/ LAST C/S   COLONOSCOPY WITH PROPOFOL  last one 08/  2013   DILATATION & CURETTAGE/HYSTEROSCOPY WITH MYOSURE N/A 11/22/2020   Procedure: DILATATION & CURETTAGE/HYSTEROSCOPY WITH MYOSURE;  Surgeon: Jerene Bears, MD;  Location: [facility];  Service: Gynecology;  Laterality: N/A;   EAR CYST EXCISION  08/03/2011   patient states this is incorrect was a Baker's cyst popliteal area   ESOPHAGOGASTRODUODENOSCOPY (EGD) WITH ESOPHAGEAL DILATION  08/2013   KNEE ARTHROSCOPY W/ MENISCAL REPAIR Left 08-03-2011  @[facility]    and excision baker's cyst   LEFT ATRIAL APPENDAGE OCCLUSION N/A 06/28/2022   Procedure: LEFT ATRIAL APPENDAGE OCCLUSION;  Surgeon: Lanier Prude, MD;  Location: [facility] INVASIVE CV LAB;  Service: Cardiovascular;  Laterality: N/A;   LUMBAR FUSION  02/19/2022   posterior- lumbar 5-6   PULLEY RELEASE RIGHT TRIGGER FINGER Right 01/08/2011   TEE WITHOUT CARDIOVERSION N/A 06/28/2022   Procedure: TRANSESOPHAGEAL ECHOCARDIOGRAM (TEE);  Surgeon: Lanier Prude, MD;  Location: [facility] INVASIVE CV LAB;  Service: Cardiovascular;  Laterality: N/A;   TRIGGER FINGER RELEASE Bilateral left 05/06/14;  right  02-29-2014  Family History  Problem Relation Age of Onset   Heart failure Mother    Osteoporosis Mother    Hyperlipidemia Mother    Hypertension Mother    Heart disease Father    Clotting disorder Father    Liver cancer Brother    Liver disease Maternal Aunt        x2   Colon cancer Neg Hx    Social History   Socioeconomic History   Marital status: Married    Spouse name: Dorene Sorrow   Number of children: 2   Years of education: Not on file   Highest education level: Not on file  Occupational History   Occupation: retired  Tobacco Use   Smoking status: Never   Smokeless tobacco: Never   Tobacco comments:    Never smoke 10/11/21  Vaping Use   Vaping status: Never Used  Substance and Sexual Activity   Alcohol use: No   Drug use: Never   Sexual activity: Not Currently    Birth control/protection: Surgical,  Post-menopausal    Comment: BTL  Other Topics Concern   Not on file  Social History Narrative   Not on file   Social Determinants of Health   Financial Resource Strain: Low Risk  (06/26/2023)   Overall Financial Resource Strain (CARDIA)    Difficulty of Paying Living Expenses: Not hard at all  Food Insecurity: No Food Insecurity (06/26/2023)   Hunger Vital Sign    Worried About Running Out of Food in the Last Year: Never true    Ran Out of Food in the Last Year: Never true  Transportation Needs: No Transportation Needs (06/26/2023)   PRAPARE - Administrator, Civil Service (Medical): No    Lack of Transportation (Non-Medical): No  Physical Activity: Insufficiently Active (06/26/2023)   Exercise Vital Sign    Days of Exercise per Week: 2 days    Minutes of Exercise per Session: 30 min  Stress: No Stress Concern Present (06/26/2023)   [employer] of Occupational Health - Occupational Stress Questionnaire    Feeling of Stress : Not at all  Social Connections: Socially Integrated (06/26/2023)   Social Connection and Isolation Panel [NHANES]    Frequency of Communication with Friends and Family: Three times a week    Frequency of Social Gatherings with Friends and Family: Three times a week    Attends Religious Services: More than 4 times per year    Active Member of Clubs or Organizations: Yes    Attends Banker Meetings: More than 4 times per year    Marital Status: Married    Tobacco Counseling Counseling given: Not Answered Tobacco comments: Never smoke 10/11/21   Clinical Intake:  Pre-visit preparation completed: Yes  Pain : 0-10 Pain Score: 3  Pain Type: Chronic pain Pain Location: Back Pain Descriptors / Indicators: Burning, Aching, Dull, Constant Pain Onset: More than a month ago Pain Frequency: Intermittent Pain Relieving Factors: xtra strenght tylenol  Pain Relieving Factors: xtra strenght tylenol  Diabetes: No     Interpreter  Needed?: No  Information entered by :: Remi Haggard Lpn   Activities of Daily Living    06/26/2023    1:48 PM 05/09/2023   11:07 AM  In your present state of health, do you have any difficulty performing the following activities:  Hearing? 0   Vision? 0   Difficulty concentrating or making decisions? 1   Walking or climbing stairs? 0   Dressing or bathing? 0  Doing errands, shopping? 0 0  Preparing Food and eating ? N   Using the Toilet? N   In the past six months, have you accidently leaked urine? N   Do you have problems with loss of bowel control? N   Managing your Medications? N   Managing your Finances? N   Housekeeping or managing your Housekeeping? N     Patient Care Team: Myrlene Broker, MD as PCP - General (Internal Medicine) Quintella Reichert, MD as PCP - Sleep Medicine (Cardiology) Lanier Prude, MD as PCP - Electrophysiology (Cardiology) Hart Carwin, MD (Inactive) (Gastroenterology) Jerene Bears, MD (Gynecology) Gibson Ramp, MD (Gastroenterology) Nelson Chimes, MD (Ophthalmology) Dominica Severin, MD (Orthopedic Surgery) Szabat, Vinnie Level, [facility] (Inactive) as Pharmacist (Pharmacist)  Indicate any recent Medical Services you may have received from other than [facility] providers in the past year (date may be approximate).     Assessment:   This is a routine wellness examination for [place].  Hearing/Vision screen Hearing Screening - Comments:: No trouble hearing Vision Screening - Comments:: Up to date [facility]   Goals Addressed             This Visit's Progress    Patient Stated       More stamina        Depression Screen    06/26/2023    1:52 PM 01/02/2023    8:45 AM 09/25/2022    9:34 AM 07/04/2022    8:38 AM 07/02/2022    1:12 PM 01/02/2022    9:07 AM 06/30/2021    2:41 PM  PHQ 2/9 Scores  PHQ - 2 Score 1 0 0 0 0 0 0  PHQ- 9 Score 7  0 0  0     Fall Risk    06/26/2023    1:43 PM 01/02/2023    8:45 AM 09/25/2022    9:34 AM  07/04/2022    8:38 AM 07/02/2022    1:08 PM  Fall Risk   Falls in the past year? 0 0 0 0 0  Number falls in past yr: 0 0 0 0 0  Injury with Fall? 0 0 0 0 0  Risk for fall due to :     No Fall Risks  Follow up Falls evaluation completed;Education provided;Falls prevention discussed Falls evaluation completed Falls evaluation completed Falls evaluation completed Falls prevention discussed    MEDICARE RISK AT HOME: Medicare Risk at Home Any stairs in or around the home?: No If so, are there any without handrails?: No Home free of loose throw rugs in walkways, pet beds, electrical cords, etc?: Yes Adequate lighting in your home to reduce risk of falls?: Yes Life alert?: No Use of a cane, walker or w/c?: No Grab bars in the bathroom?: Yes Shower chair or bench in shower?: Yes Elevated toilet seat or a handicapped toilet?: Yes  TIMED UP AND GO:  Was the test performed?  No    Cognitive Function:        06/26/2023    1:49 PM 07/02/2022    1:16 PM  6CIT Screen  What Year? 0 points 0 points  What month? 0 points 0 points  What time? 0 points 0 points  Count back from 20 0 points 0 points  Months in reverse 0 points 0 points  Repeat phrase 0 points 0 points  Total Score 0 points 0 points    Immunizations Immunization History  Administered Date(s) Administered   Fluad Quad(high Dose  65+) 04/05/2022   Influenza Split 04/24/2011, 05/03/2012, 04/22/2013   Influenza, High Dose Seasonal PF 03/30/2015, 04/02/2016, 05/07/2017, 05/09/2018, 04/17/2019, 03/28/2021   Influenza,inj,Quad PF,6+ Mos 03/16/2014   Influenza-Unspecified 04/17/2019, 04/06/2020   PFIZER(Purple Top)SARS-COV-2 Vaccination 09/15/2019, 10/06/2019, 04/26/2020   Pneumococcal Conjugate-13 03/30/2015   Pneumococcal Polysaccharide-23 07/23/2004, 05/07/2017   Tdap 07/23/2004, 08/04/2015   Zoster Recombinant(Shingrix) 05/17/2017, 10/02/2017   Zoster, Live 05/23/2013    TDAP status: Up to date  Flu Vaccine status:  Due, Education has been provided regarding the importance of this vaccine. Advised may receive this vaccine at local pharmacy or Health Dept. Aware to provide a copy of the vaccination record if obtained from local pharmacy or Health Dept. Verbalized acceptance and understanding.  Pneumococcal vaccine status: Up to date  Covid-19 vaccine status: Declined, Education has been provided regarding the importance of this vaccine but patient still declined. Advised may receive this vaccine at local pharmacy or Health Dept.or vaccine clinic. Aware to provide a copy of the vaccination record if obtained from local pharmacy or Health Dept. Verbalized acceptance and understanding.  Qualifies for Shingles Vaccine? No   Zostavax completed Yes   Shingrix Completed?: Yes  Screening Tests Health Maintenance  Topic Date Due   COVID-19 Vaccine (4 - 2023-24 season) 07/12/2023 (Originally 03/24/2023)   Colonoscopy  07/27/2023 (Originally 03/12/2022)   INFLUENZA VACCINE  10/21/2023 (Originally 02/21/2023)   Medicare Annual Wellness (AWV)  06/25/2024   MAMMOGRAM  10/31/2024   DTaP/Tdap/Td (3 - Td or Tdap) 08/03/2025   Pneumonia Vaccine 87+ Years old  Completed   DEXA SCAN  Completed   Hepatitis C Screening  Completed   Zoster Vaccines- Shingrix  Completed   HPV VACCINES  Aged Out    Health Maintenance  There are no preventive care reminders to display for this patient.   Colorectal cancer screening: Type of screening: Cologuard. Completed  . Repeat every 3 years  Mammogram status: Completed  . Repeat every year  Bone Density  Education provided     Lung Cancer Screening: (Low Dose CT Chest recommended if Age 55-80 years, 20 pack-year currently smoking OR have quit w/in 15years.) does not qualify.   Lung Cancer Screening Referral:   Additional Screening:  Hepatitis C Screening: does not qualify; Completed 2017  Vision Screening: Recommended annual ophthalmology exams for early detection of glaucoma  and other disorders of the eye. Is the patient up to date with their annual eye exam?  Yes  Who is the provider or what is the name of the office in which the patient attends annual eye exams? [facility] If pt is not established with a provider, would they like to be referred to a provider to establish care? No .   Dental Screening: Recommended annual dental exams for proper oral hygiene    Community Resource Referral / Chronic Care Management: CRR required this visit?  No   CCM required this visit?  No     Plan:     I have personally reviewed and noted the following in the patient's chart:   Medical and social history Use of alcohol, tobacco or illicit drugs  Current medications and supplements including opioid prescriptions. Patient is not currently taking opioid prescriptions. Functional ability and status Nutritional status Physical activity Advanced directives List of other physicians Hospitalizations, surgeries, and ER visits in previous 12 months Vitals Screenings to include cognitive, depression, and falls Referrals and appointments  In addition, I have reviewed and discussed with patient certain preventive protocols, quality metrics, and  best practice recommendations. A written personalized care plan for preventive services as well as general preventive health recommendations were provided to patient.     Remi Haggard, LPN   16/07/958   After Visit Summary: (MyChart) Due to this being a telephonic visit, the after visit summary with patients personalized plan was offered to patient via MyChart   Nurse Notes:

## 2023-07-04 ENCOUNTER — Emergency Department (HOSPITAL_COMMUNITY)
Admission: EM | Admit: 2023-07-04 | Discharge: 2023-07-04 | Disposition: A | Discharge status: Home / Self Care | Payer: Medicare Other | Attending: Emergency Medicine | Admitting: Emergency Medicine

## 2023-07-04 ENCOUNTER — Telehealth: Payer: Self-pay

## 2023-07-04 ENCOUNTER — Emergency Department (HOSPITAL_COMMUNITY): Payer: Medicare Other

## 2023-07-04 ENCOUNTER — Encounter (HOSPITAL_COMMUNITY): Payer: Self-pay

## 2023-07-04 ENCOUNTER — Other Ambulatory Visit: Payer: Self-pay

## 2023-07-04 DIAGNOSIS — R002 Palpitations: Secondary | ICD-10-CM | POA: Diagnosis present

## 2023-07-04 DIAGNOSIS — I4892 Unspecified atrial flutter: Secondary | ICD-10-CM | POA: Insufficient documentation

## 2023-07-04 DIAGNOSIS — R531 Weakness: Secondary | ICD-10-CM | POA: Insufficient documentation

## 2023-07-04 DIAGNOSIS — Z7901 Long term (current) use of anticoagulants: Secondary | ICD-10-CM | POA: Insufficient documentation

## 2023-07-04 LAB — COMPREHENSIVE METABOLIC PANEL
ALT: 15 U/L (ref 0–44)
AST: 18 U/L (ref 15–41)
Albumin: 4.2 g/dL (ref 3.5–5.0)
Alkaline Phosphatase: 79 U/L (ref 38–126)
Anion gap: 11 (ref 5–15)
BUN: 17 mg/dL (ref 8–23)
CO2: 26 mmol/L (ref 22–32)
Calcium: 10.1 mg/dL (ref 8.9–10.3)
Chloride: 106 mmol/L (ref 98–111)
Creatinine, Ser: 0.82 mg/dL (ref 0.44–1.00)
GFR, Estimated: >60 mL/min (ref >60)
Glucose, Bld: 137 mg/dL — ABNORMAL HIGH (ref 70–99)
Potassium: 4.6 mmol/L (ref 3.5–5.1)
Sodium: 143 mmol/L (ref 135–145)
Total Bilirubin: 0.6 mg/dL (ref <1.2)
Total Protein: 6.8 g/dL (ref 6.5–8.1)

## 2023-07-04 LAB — CBC WITH DIFFERENTIAL/PLATELET
Abs Immature Granulocytes: 0.05 10*3/uL (ref 0.00–0.07)
Basophils Absolute: 0.1 10*3/uL (ref 0.0–0.1)
Basophils Relative: 1 %
Eosinophils Absolute: 0.1 10*3/uL (ref 0.0–0.5)
Eosinophils Relative: 1 %
HCT: 41.7 % (ref 36.0–46.0)
Hemoglobin: 13 g/dL (ref 12.0–15.0)
Immature Granulocytes: 1 %
Lymphocytes Relative: 18 %
Lymphs Abs: 1.7 10*3/uL (ref 0.7–4.0)
MCH: 26.3 pg (ref 26.0–34.0)
MCHC: 31.2 g/dL (ref 30.0–36.0)
MCV: 84.4 fL (ref 80.0–100.0)
Monocytes Absolute: 0.7 10*3/uL (ref 0.1–1.0)
Monocytes Relative: 7 %
Neutro Abs: 7 10*3/uL (ref 1.7–7.7)
Neutrophils Relative %: 72 %
Platelets: 302 10*3/uL (ref 150–400)
RBC: 4.94 MIL/uL (ref 3.87–5.11)
RDW: 13.9 % (ref 11.5–15.5)
WBC: 9.5 10*3/uL (ref 4.0–10.5)
nRBC: 0 % (ref 0.0–0.2)

## 2023-07-04 LAB — TROPONIN I (HIGH SENSITIVITY)
Troponin I (High Sensitivity): 10 ng/L (ref <18)
Troponin I (High Sensitivity): 12 ng/L (ref <18)

## 2023-07-04 LAB — MAGNESIUM: Magnesium: 2.5 mg/dL — ABNORMAL HIGH (ref 1.7–2.4)

## 2023-07-04 MED ORDER — APIXABAN 5 MG PO TABS
5.0000 mg | ORAL_TABLET | Freq: Once | ORAL | Status: AC
  Administered 2023-07-04: 5 mg via ORAL
  Filled 2023-07-04: qty 1

## 2023-07-04 MED ORDER — APIXABAN 5 MG PO TABS: 5.0000 mg | ORAL_TABLET | Freq: Two times a day (BID) | ORAL | 0 refills | Status: DC

## 2023-07-04 NOTE — Discharge Instructions (Addendum)
You were restarted on Eliquis today.  A prescription was sent to your pharmacy.  You need to take this 2 times daily (every 12 hours) until you see your cardiologist Monday morning and they will direct you on whether to continue taking Eliquis.  You have an appointment scheduled with your cardiologist at 8:40 AM on Monday morning.  They will discuss scheduling you for a cardioversion at that time.  DO NOT continue taking your flecainide.  He should also hold your aspirin while you are taking your Eliquis.  You should continue all of your other medications.  Return to the emergency department if you develop worsening chest pain, worsening palpitations, lightheadedness, episodes of passing out.

## 2023-07-04 NOTE — Telephone Encounter (Signed)
Received inbound call from [facility] with [facility]. This RNCM provided a verbal order for the below listed medication.   No additional TOC needs at this time.    apixaban (ELIQUIS) 5 MG TABS tablet [[id number]]   Order Details Dose: 5 mg Route: Oral Frequency: 2 times daily  Dispense Quantity: 14 tablet Refills: 0   Indications of Use: Atrial Flutter       Sig: Take 1 tablet (5 mg total) by mouth 2 (two) times daily for 7 days.       Start Date: 07/04/23 End Date: 07/11/23 after 14 doses  Written Date: 07/04/23 Expiration Date: 07/03/24

## 2023-07-04 NOTE — ED Provider Triage Note (Signed)
Emergency Medicine Provider Triage Evaluation Note  Amy Williamson , a 72 y.o. female  was evaluated in triage.  Pt complains of racing heart beat since 7AM today. Hx of watchman. Took flecanide and it typically relieves but this time has no improved. Started having chest pain around 10AM w/L arm radiating. No N/V or SOB. HR was in 120s at home. Recent back surgery October 25th. No hx of blood clot, off blood thinners since watchman procedure.   Review of Systems  Positive: palpitations Negative: SOB  Physical Exam  BP (!) 141/82 (BP Location: Right Arm)   Pulse (!) 102   Temp 98.2 F (36.8 C) (Oral)   Resp 20   Ht 5' (1.524 m)   Wt 91.6 kg   LMP 07/23/2004   SpO2 98%   BMI 39.45 kg/m  Gen:   Awake, no distress   Resp:  Normal effort  MSK:   Moves extremities without difficulty   Medical Decision Making  Medically screening exam initiated at 1:37 PM.  Appropriate orders placed.  Amy Williamson was informed that the remainder of the evaluation will be completed by another provider, this initial triage assessment does not replace that evaluation, and the importance of remaining in the ED until their evaluation is complete.    Amy Williamson, [place] 07/04/23 1342

## 2023-07-04 NOTE — ED Provider Notes (Signed)
[place] [facility]PARTMENT AT [facility] Provider Note   CSN: [id number] Arrival date & time: 07/04/23  1315     History  Chief Complaint  Patient presents with   Chest Pain   Tachycardia    Amy Williamson is a 72 y.o. female.  72 year old female with past medical history of A-fib s/p watchman and ablation, GERD presents here for concerns of palpitations that began acutely at 7 AM this morning.  Patient was sitting down watching TV when her symptoms began.  She endorses associated chest tightness that was previously radiating to her left arm.  Denies any shortness of breath.  Patient had associated diaphoresis on the onset of symptoms.  No nausea or vomiting.  No lower extremity edema.  She did take her dose of flecainide this morning.  In the past, this has worked to convert her back to sinus rhythm.  However, she is continue to have symptoms despite taking her flecainide.  She had 1 episode of presyncope while in the shower.  Denies any other episodes of presyncope.  Is endorsing generalized weakness.  No recent illnesses, fevers, chills.  The history is provided by the patient.       Home Medications Prior to Admission medications   Medication Sig Start Date End Date Taking? Authorizing Provider  acetaminophen (TYLENOL) 650 MG CR tablet Take 1,300 mg by mouth in the morning and at bedtime.   Yes [provider]  apixaban (ELIQUIS) 5 MG TABS tablet Take 1 tablet (5 mg total) by mouth 2 (two) times daily for 7 days. 07/04/23 07/11/23 Yes Rolla Flatten, MD  Cholecalciferol (VITAMIN D3 PO) Take 1 tablet by mouth daily.   Yes [provider]  Cyanocobalamin (B-12 PO) Take 1 tablet by mouth daily.   Yes [provider]  diltiazem (TIAZAC) 240 MG 24 hr capsule Take 240 mg by mouth daily. 04/27/23  Yes [provider]  flecainide (TAMBOCOR) 50 MG tablet Take 0.5 tablets (25 mg total) by mouth 2 (two) times daily as needed (breakthrough  afib). Patient taking differently: Take 50 mg by mouth 2 (two) times daily as needed (breakthrough afib). 12/12/21  Yes Fenton, Clint R, PA  pantoprazole (PROTONIX) 40 MG tablet Take 1 tablet (40 mg total) by mouth daily. 07/04/22  Yes Myrlene Broker, MD  albuterol (VENTOLIN HFA) 108 (90 Base) MCG/ACT inhaler Inhale 1-2 puffs into the lungs every 6 (six) hours as needed for wheezing or shortness of breath. Patient not taking: Reported on 06/26/2023 09/06/22   Gustavus Bryant, FNP  diltiazem (CARDIZEM CD) 240 MG 24 hr capsule Take 1 capsule (240 mg total) by mouth daily. Patient not taking: Reported on 07/04/2023 01/31/23   Christell Constant, MD  methocarbamol (ROBAXIN-750) 750 MG tablet Take 1 tablet (750 mg total) by mouth 4 (four) times daily. Patient not taking: Reported on 07/04/2023 05/18/23   Meyran, Tiana Loft, NP  oxyCODONE-acetaminophen (PERCOCET/ROXICET) 5-325 MG tablet Take 1 tablet by mouth every 6 (six) hours as needed for severe pain (pain score 7-10). Patient not taking: Reported on 06/26/2023 05/18/23   Meyran, Tiana Loft, NP  rosuvastatin (CRESTOR) 20 MG tablet TAKE 1 TABLET BY MOUTH EVERY DAY Patient not taking: Reported on 01/02/2023 02/05/22   Christell Constant, MD      Allergies    Tramadol, Atenolol, Avelox [moxifloxacin hcl in nacl], Azithromycin, Doxycycline, Myrbetriq [mirabegron], Naproxen, and Oxybutynin    Review of Systems   As noted in HPI  Physical  Exam Updated Vital Signs BP (!) 152/61   Pulse 78   Temp 99.1 F (37.3 C) (Oral)   Resp 19   Ht 5' (1.524 m)   Wt 91.6 kg   LMP 07/23/2004   SpO2 100%   BMI 39.45 kg/m  Physical Exam Vitals reviewed.  Constitutional:      General: She is not in acute distress.    Appearance: Normal appearance. She is not ill-appearing, toxic-appearing or diaphoretic.  HENT:     Head: Normocephalic.     Nose: Nose normal.     Mouth/Throat:     Mouth: Mucous membranes are moist.  Eyes:      Conjunctiva/sclera: Conjunctivae normal.  Cardiovascular:     Rate and Rhythm: Normal rate. Rhythm irregular.     Pulses: Normal pulses.          Radial pulses are 2+ on the right side and 2+ on the left side.       Dorsalis pedis pulses are 2+ on the right side and 2+ on the left side.     Heart sounds: Normal heart sounds. No murmur heard.    No friction rub. No gallop.  Pulmonary:     Effort: Pulmonary effort is normal. No respiratory distress.     Breath sounds: Normal breath sounds. No wheezing, rhonchi or rales.  Abdominal:     General: There is no distension.     Palpations: Abdomen is soft.     Tenderness: There is no abdominal tenderness. There is no guarding or rebound.  Musculoskeletal:     Right lower leg: No edema.     Left lower leg: No edema.  Skin:    General: Skin is warm and dry.  Neurological:     Mental Status: She is alert.     ED Results / Procedures / Treatments   Labs (all labs ordered are listed, but only abnormal results are displayed) Labs Reviewed  COMPREHENSIVE METABOLIC PANEL - Abnormal; Notable for the following components:      Result Value   Glucose, Bld 137 (*)    All other components within normal limits  MAGNESIUM - Abnormal; Notable for the following components:   Magnesium 2.5 (*)    All other components within normal limits  CBC WITH DIFFERENTIAL/PLATELET  TROPONIN I (HIGH SENSITIVITY)  TROPONIN I (HIGH SENSITIVITY)    EKG EKG Interpretation Date/Time:  Thursday July 04 2023 15:36:18 EST Ventricular Rate:  83 PR Interval:    QRS Duration:  89 QT Interval:  518 QTC Calculation: 539 R Axis:   15  Text Interpretation: Atrial flutter Ventricular bigeminy Low voltage, precordial leads Prolonged QT interval No significant change since last tracing Confirmed by Richardean Canal [phone]) on 07/04/2023 3:38:44 PM  Radiology DG Chest 2 View Result Date: 07/04/2023 CLINICAL DATA:  Chest pain EXAM: CHEST - 2 VIEW COMPARISON:  None  Available. FINDINGS: The heart size and mediastinal contours are within normal limits. Subtle linear opacities within the bilateral lung bases. Lungs are otherwise clear. No pleural effusion or pneumothorax. The visualized skeletal structures are unremarkable. IMPRESSION: Subtle linear opacities within the bilateral lung bases, likely atelectasis. Otherwise, no acute cardiopulmonary findings. Electronically Signed   By: Duanne Guess D.O.   On: 07/04/2023 15:08    Procedures Procedures    Medications Ordered in ED Medications  apixaban (ELIQUIS) tablet 5 mg (5 mg Oral Given 07/04/23 1738)    ED Course/ Medical Decision Making/ A&P Clinical Course as of 07/04/23 1811  Thu Jul 04, 2023  1500 DG Chest 2 View I independently interpreted this image.  No mediastinal widening, cardiomegaly, pneumothorax, or focal opacities. [JR]  1538 EKG 12-Lead Irregular rhythm consistent with atrial flutter.  Rate of 83.  No ST segment changes.  Nonischemic ECG. [JR]    Clinical Course User Index [JR] Rolla Flatten, MD                                 Medical Decision Making Amount and/or Complexity of Data Reviewed Labs: ordered. Radiology:  Decision-making details documented in ED Course. ECG/medicine tests:  Decision-making details documented in ED Course.  Risk Prescription drug management.   Patient's presentation is most consistent with acute presentation with potential threat to life or bodily function.  72 year old female presents here for concerns of palpitations.  Tachycardic to 102 on presentation.  Vitals otherwise reassuring.  On exam, notable to have irregular rhythm on cardiac exam.  2+ radial and DP pulses.  No lower extremity edema.  Lungs clear to auscultation.  Initial differential diagnosis includes arrhythmia, ACS, electrolyte abnormality, AKI.  I independently interpreted the patient's ECG, which is consistent with atrial flutter.  I independently interpreted her chest  x-ray, which is without acute findings.  Independently interpreted the patient's laboratory workup, which is notable for magnesium that was elevated at 2.5.  Otherwise, unremarkable.  Serial troponins stable at 10 and 12 respectively.  Not consistent with ACS.  No significant electrolyte derangements.  No AKI on metabolic panel.  Patient does have a Watchman.  Unsure whether patient would still have an elevated risk for thromboembolism if cardioversion were performed today.  Therefore, we will consult cardiology.  I discussed the case with cardiology, Jerene Dilling, who also discussed the case with supervising cardiologist.  Cardioversion unable to be performed with Watchman in place unless patient has had 3 doses of anticoagulation.  Patient is not currently anticoagulated.  Therefore, not appropriate to perform this in the emergency department today.  I have arranged close follow-up with cardiology for the patient.  She has a visit scheduled this coming Monday for evaluation.  Will restart patient's Eliquis in the emergency department today as she does not have any contraindications and stopped this only because she did not want to take blood thinners.  First dose of Eliquis given here in the emergency department.  Patient has been rate controlled throughout her stay here.  She is on diltiazem.  Instructed to hold flecainide and aspirin until follow-up with cardiologist.  I considered hospital admission.  However, feel the patient is more appropriate for outpatient management.  Thorough return precautions were discussed with the patient at the time of discharge.  She was discharged in stable condition.        Final Clinical Impression(s) / ED Diagnoses Final diagnoses:  Atrial flutter, unspecified type [facility])    Rx / DC Orders ED Discharge Orders          Ordered    apixaban (ELIQUIS) 5 MG TABS tablet  2 times daily        07/04/23 1801              Rolla Flatten, MD 07/04/23  1811    Charlynne Pander, MD 07/09/23 [phone]

## 2023-07-04 NOTE — ED Triage Notes (Signed)
Pt to ED c/o tightness to chest, heart racing,  left arm weakness that started at 7 am this morning. Reports was watching tv when pain occurred, reports has been constant ever since.  Reports hx AFIB. HX Ablation 1 year ago.  Reports back surgery 2 months ago.

## 2023-07-05 NOTE — Progress Notes (Unsigned)
[facility]                                     Cardiology Office Note:    Date:  07/09/2023   ID:  Amy Williamson, DOB May 30, 1951, MRN [id number]  PCP:  Myrlene Broker, MD  [facility] Cardiologist:  None  [facility] Electrophysiologist:  Lanier Prude, MD   Referring MD: Myrlene Broker, *   Chief Complaint  Patient presents with   Pre-op Exam   History of Present Illness:    TEMPY ALCINDOR is a 72 y.o. female with a hx of HTN, aortic atherosclerosis, OSA, HLD, and atrial fibrillation s/p AF ablation and subsequent Watchman 06/28/22 who presents today with recurrent AF with RVR.    Ms. Salo was initially dx with AF 06/2020 and underwent initial DCCV on 07/28/20. She was started on Eliquis at that time. She had recurrent AF by 09/2022 with repeat cardioversion x2. Flecainide was initiated and she was referred to EP for ablation consideration. She saw Dr. Lalla Brothers 12/20/21 and underwent AF ablation 03/29/22 and is now s/p successful LAAO with Watchman FLX 27mm device 06/29/23. She was restarted on Eliquis 5mg  and transitioned to Plavix after 45 days later. Post implant CT 09/05/22 showed a well seated device with no leak or device thrombus.    She was seen by our team for 6 month post Watchman follow up and her Plavix was stopped at that time. Unfortunately she presented to the ED last week with symptoms of fatigue, palpitations, and chest pain found to be in rate controlled atrial flutter. Initial plan was to direct cardiovert her in the ED however with consistent breakthrough AF and Watchman placement our team was contacted with plan to restart Eliquis for a minimum of three doses then plan outpatient cardioversion.   Today she is here with her husband and reports that she has ben in persistent AF since her discharge from the ED. She was told to stop her Flecainide and hold ASA given restart of Eliquis. EKG today shows atrial flutter with HR 116bpm.  Overall she has been well and states that when she is resting her rates remain stable but when she ambulates, rates will peak into the 130-140 range. She has not tried PRN diltiazem but has been compliant with Eliquis. BP is on the softer side therefore difficult to titrate meds. Discussed adding Amiodarone in the interm. Otherwise, she denies chest pain, LE edema, orthopnea, PND, dizziness, or syncope. Denies bleeding in stool or urine. Mostly having fatigue and palpitations with AF.  Past Medical History:  Diagnosis Date   Anticoagulant long-term use    eliquis --- managed by cardiology   Arthritis    BACK AND SHOULDERS   Depressive disorder, not elsewhere classified    Essential hypertension    followed by pcp and cardiology   Frequency of urination    GERD (gastroesophageal reflux disease)    Headache    migraines when younger   History of dysphagia 2015   s/p egd w/ dilation, pt did not have stricture   History of gastritis    OSA (obstructive sleep apnea)    followed by dr t. turner---  study in epic 09-03-2020 moderate osa ,  pt scheduled for cpap titration 12-08-2020- pt wears oral device, cannot wear CPAP   PAF (paroxysmal atrial fibrillation) [facility])    cardiologist-- dr m.  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   Watchman FLX 27mm with Dr. Lalla Brothers   Pure hypercholesterolemia     Past Surgical History:  Procedure Laterality Date   ATRIAL FIBRILLATION ABLATION N/A 03/30/2022   Procedure: ATRIAL FIBRILLATION ABLATION;  Surgeon: Lanier Prude, MD;  Location: [facility] INVASIVE CV LAB;  Service: Cardiovascular;   Laterality: N/A;   BREAST LUMPECTOMY WITH RADIOACTIVE SEED LOCALIZATION Left 06/09/2020   Procedure: LEFT BREAST LUMPECTOMY WITH RADIOACTIVE SEED LOCALIZATION;  Surgeon: Abigail Miyamoto, MD;  Location: [facility];  Service: General;  Laterality: Left;  LMA   CARDIOVERSION N/A 07/28/2020   Procedure: CARDIOVERSION;  Surgeon: Christell Constant, MD;  Location: [facility] ENDOSCOPY;  Service: Cardiovascular;  Laterality: N/A;   CARPAL TUNNEL RELEASE Bilateral right 2004;  left 2006   CATARACT EXTRACTION W/ INTRAOCULAR LENS  IMPLANT, BILATERAL  2016   CESAREAN SECTION  [id number]   BILATERAL TUBAL LIGATION W/ LAST C/S   COLONOSCOPY WITH PROPOFOL  last one 08/ 2013   DILATATION & CURETTAGE/HYSTEROSCOPY WITH MYOSURE N/A 11/22/2020   Procedure: DILATATION & CURETTAGE/HYSTEROSCOPY WITH MYOSURE;  Surgeon: Jerene Bears, MD;  Location: [facility];  Service: Gynecology;  Laterality: N/A;   EAR CYST EXCISION  08/03/2011   patient states this is incorrect was a Baker's cyst popliteal area   ESOPHAGOGASTRODUODENOSCOPY (EGD) WITH ESOPHAGEAL DILATION  08/2013   KNEE ARTHROSCOPY W/ MENISCAL REPAIR Left 08-03-2011  @[facility]    and excision baker's cyst   LEFT ATRIAL APPENDAGE OCCLUSION N/A 06/28/2022   Procedure: LEFT ATRIAL APPENDAGE OCCLUSION;  Surgeon: Lanier Prude, MD;  Location: [facility] INVASIVE CV LAB;  Service: Cardiovascular;  Laterality: N/A;   LUMBAR FUSION  02/19/2022   posterior- lumbar 5-6   PULLEY RELEASE RIGHT TRIGGER FINGER Right 01/08/2011   TEE WITHOUT CARDIOVERSION N/A 06/28/2022   Procedure: TRANSESOPHAGEAL ECHOCARDIOGRAM (TEE);  Surgeon: Lanier Prude, MD;  Location: [facility] INVASIVE CV LAB;  Service: Cardiovascular;  Laterality: N/A;   TRIGGER FINGER RELEASE Bilateral left 05/06/14;  right  02-29-2014    Current Medications: Current Meds  Medication Sig   acetaminophen (TYLENOL) 650 MG CR tablet Take 1,300 mg by mouth in the morning and at bedtime.    bisacodyl (DULCOLAX) 5 MG EC tablet Take 5 mg by mouth daily as needed for moderate constipation.   Cholecalciferol (VITAMIN D3 PO) Take 1 tablet by mouth daily.   Cyanocobalamin (B-12 PO) Take 1 tablet by mouth daily.   diltiazem (TIAZAC) 240 MG 24 hr capsule Take 240 mg by mouth daily.   flecainide (TAMBOCOR) 50 MG tablet Take 0.5 tablets (25 mg total) by mouth 2 (two) times daily as needed (breakthrough afib). (Patient taking differently: Take 50 mg by mouth 2 (two) times daily as needed (breakthrough afib).)   pantoprazole (PROTONIX) 40 MG tablet Take 1 tablet (40 mg total) by mouth daily.   [DISCONTINUED] amiodarone (PACERONE) 200 MG tablet Take one (1) tablet by mouth ( 200 mg) twice daily X 2 weeks than take One (1) tablet by mouth ( 200 mg) daily.   [DISCONTINUED] apixaban (ELIQUIS) 5 MG TABS tablet Take 1  tablet (5 mg total) by mouth 2 (two) times daily for 7 days.   [DISCONTINUED] methocarbamol (ROBAXIN-750) 750 MG tablet Take 1 tablet (750 mg total) by mouth 4 (four) times daily.   [DISCONTINUED] oxyCODONE-acetaminophen (PERCOCET/ROXICET) 5-325 MG tablet Take 1 tablet by mouth every 6 (six) hours as needed for severe pain (pain score 7-10).     Allergies:   Tramadol, Atenolol, Avelox [moxifloxacin hcl in nacl], Azithromycin, Doxycycline, Myrbetriq [mirabegron], Naproxen, and Oxybutynin   Social History   Socioeconomic History   Marital status: Married    Spouse name: Dorene Sorrow   Number of children: 2   Years of education: Not on file   Highest education level: Not on file  Occupational History   Occupation: retired  Tobacco Use   Smoking status: Never   Smokeless tobacco: Never   Tobacco comments:    Never smoke 10/11/21  Vaping Use   Vaping status: Never Used  Substance and Sexual Activity   Alcohol use: No   Drug use: Never   Sexual activity: Not Currently    Birth control/protection: Surgical, Post-menopausal    Comment: BTL  Other Topics Concern   Not on file  Social  History Narrative   Not on file   Social Drivers of Health   Financial Resource Strain: Low Risk  (06/26/2023)   Overall Financial Resource Strain (CARDIA)    Difficulty of Paying Living Expenses: Not hard at all  Food Insecurity: No Food Insecurity (06/26/2023)   Hunger Vital Sign    Worried About Running Out of Food in the Last Year: Never true    Ran Out of Food in the Last Year: Never true  Transportation Needs: No Transportation Needs (06/26/2023)   PRAPARE - Administrator, Civil Service (Medical): No    Lack of Transportation (Non-Medical): No  Physical Activity: Insufficiently Active (06/26/2023)   Exercise Vital Sign    Days of Exercise per Week: 2 days    Minutes of Exercise per Session: 30 min  Stress: No Stress Concern Present (06/26/2023)   [employer] of Occupational Health - Occupational Stress Questionnaire    Feeling of Stress : Not at all  Social Connections: Socially Integrated (06/26/2023)   Social Connection and Isolation Panel [NHANES]    Frequency of Communication with Friends and Family: Three times a week    Frequency of Social Gatherings with Friends and Family: Three times a week    Attends Religious Services: More than 4 times per year    Active Member of Clubs or Organizations: Yes    Attends Engineer, structural: More than 4 times per year    Marital Status: Married    Family History: The patient's family history includes Clotting disorder in her father; Heart disease in her father; Heart failure in her mother; Hyperlipidemia in her mother; Hypertension in her mother; Liver cancer in her brother; Liver disease in her maternal aunt; Osteoporosis in her mother. There is no history of Colon cancer.  ROS:   Please see the history of present illness.    All other systems reviewed and are negative.  EKGs/Labs/Other Studies Reviewed:    The following studies were reviewed today:   Cardiac Studies & Procedures     STRESS  TESTS  MYOCARDIAL PERFUSION IMAGING 10/19/2021  Narrative   The study is normal. The study is low risk.   No ST deviation was noted.   LV perfusion is normal.   Left ventricular function is normal. Nuclear stress EF:  69 %. The left ventricular ejection fraction is hyperdynamic (>65%). End diastolic cavity size is normal.   Prior study not available for comparison.  ECHOCARDIOGRAM  ECHOCARDIOGRAM COMPLETE 01/10/2022  Narrative ECHOCARDIOGRAM REPORT    Patient Name:   JAISA MILLE Date of Exam: 01/10/2022 Medical Rec #:  [id number]        Height:       60.0 in Accession #:    [id number]       Weight:       216.0 lb Date of Birth:  [birth year]         BSA:          1.929 m Patient Age:    71 years         BP:           140/74 mmHg Patient Gender: F                HR:           70 bpm. Exam Location:  [address]  Procedure: 2D Echo, Cardiac Doppler and Color Doppler  Indications:    I48.19 Atrial fibrillation  History:        Patient has prior history of Echocardiogram examinations, most recent 08/10/2020. Arrythmias:Atrial Fibrillation; Risk Factors:Morbid obesity and Hypertension.  Sonographer:    Samule Ohm RDCS Referring Phys: [id number] Rossie Muskrat LAMBERT  IMPRESSIONS   1. Left ventricular ejection fraction, by estimation, is 60 to 65%. The left ventricle has normal function. The left ventricle has no regional wall motion abnormalities. Left ventricular diastolic parameters are indeterminate. 2. Right ventricular systolic function is normal. The right ventricular size is normal. There is normal pulmonary artery systolic pressure. 3. Left atrial size was mildly dilated. 4. The mitral valve is normal in structure. Mild mitral valve regurgitation. No evidence of mitral stenosis. 5. The aortic valve is grossly normal. Aortic valve regurgitation is not visualized. No aortic stenosis is present. 6. The inferior vena cava is normal in size with greater than 50% respiratory  variability, suggesting right atrial pressure of 3 mmHg.  Comparison(s): No significant change from prior study.  Conclusion(s)/Recommendation(s): Otherwise normal echocardiogram, with minor abnormalities described in the report.  FINDINGS Left Ventricle: Left ventricular ejection fraction, by estimation, is 60 to 65%. The left ventricle has normal function. The left ventricle has no regional wall motion abnormalities. The left ventricular internal cavity size was normal in size. There is no left ventricular hypertrophy. Left ventricular diastolic parameters are indeterminate.  Right Ventricle: The right ventricular size is normal. No increase in right ventricular wall thickness. Right ventricular systolic function is normal. There is normal pulmonary artery systolic pressure. The tricuspid regurgitant velocity is 2.61 m/s, and with an assumed right atrial pressure of 3 mmHg, the estimated right ventricular systolic pressure is 30.2 mmHg.  Left Atrium: Left atrial size was mildly dilated.  Right Atrium: Right atrial size was normal in size.  Pericardium: Trivial pericardial effusion is present.  Mitral Valve: The mitral valve is normal in structure. There is mild thickening of the mitral valve leaflet(s). There is mild calcification of the mitral valve leaflet(s). Mild mitral valve regurgitation, with eccentric laterally directed jet. No evidence of mitral valve stenosis.  Tricuspid Valve: The tricuspid valve is normal in structure. Tricuspid valve regurgitation is mild . No evidence of tricuspid stenosis.  Aortic Valve: The aortic valve is grossly normal. Aortic valve regurgitation is not visualized. No aortic stenosis is present.  Pulmonic Valve: The pulmonic valve was  not well visualized. Pulmonic valve regurgitation is trivial. No evidence of pulmonic stenosis.  Aorta: The aortic root, ascending aorta, aortic arch and descending aorta are all structurally normal, with no evidence of  dilitation or obstruction.  Venous: The inferior vena cava is normal in size with greater than 50% respiratory variability, suggesting right atrial pressure of 3 mmHg.  IAS/Shunts: The atrial septum is grossly normal.   LEFT VENTRICLE PLAX 2D LVIDd:         4.70 cm   Diastology LVIDs:         3.00 cm   LV e' medial:    11.70 cm/s LV PW:         1.10 cm   LV E/e' medial:  12.6 LV IVS:        0.90 cm   LV e' lateral:   5.55 cm/s LVOT diam:     1.90 cm   LV E/e' lateral: 26.7 LV SV:         75 LV SV Index:   39 LVOT Area:     2.84 cm   RIGHT VENTRICLE             IVC RV S prime:     14.80 cm/s  IVC diam: 1.70 cm TAPSE (M-mode): 2.9 cm RVSP:           30.2 mmHg  LEFT ATRIUM             Index        RIGHT ATRIUM           Index LA diam:        4.10 cm 2.13 cm/m   RA Pressure: 3.00 mmHg LA Vol (A2C):   51.8 ml 26.86 ml/m  RA Area:     11.20 cm LA Vol (A4C):   63.8 ml 33.08 ml/m  RA Volume:   20.50 ml  10.63 ml/m LA Biplane Vol: 59.2 ml 30.69 ml/m AORTIC VALVE LVOT Vmax:   111.00 cm/s LVOT Vmean:  73.700 cm/s LVOT VTI:    0.265 m  AORTA Ao Root diam: 3.10 cm Ao Asc diam:  3.40 cm  MITRAL VALVE                TRICUSPID VALVE MV Area (PHT): 3.23 cm     TR Peak grad:   27.2 mmHg MV Decel Time: 235 msec     TR Vmax:        261.00 cm/s MV E velocity: 148.00 cm/s  Estimated RAP:  3.00 mmHg MV A velocity: 136.00 cm/s  RVSP:           30.2 mmHg MV E/A ratio:  1.09 SHUNTS Systemic VTI:  0.26 m Systemic Diam: 1.90 cm  Jodelle Red MD Electronically signed by Jodelle Red MD Signature Date/Time: 01/10/2022/2:39:07 PM    Final  TEE  ECHO TEE 06/28/2022  Narrative TRANSESOPHOGEAL ECHO REPORT    Patient Name:   Amy Williamson Date of Exam: 06/28/2022 Medical Rec #:  [id number]        Height:       60.0 in Accession #:    [id number]       Weight:       200.0 lb Date of Birth:  03-15-1951         BSA:          1.867 m Patient Age:    71 years          BP:  136/51 mmHg Patient Gender: F                HR:           76 bpm. Exam Location:  Inpatient  Procedure: Transesophageal Echo, Cardiac Doppler, Color Doppler and 3D Echo  Indications:     Persistent atrial fibrillation (HCC) [Z61.09 (ICD-10-CM)]  History:         Patient has prior history of Echocardiogram examinations, most recent 01/10/2022. Risk Factors:Hypertension, Sleep Apnea and Dyslipidemia. 27mm Watchman implanted on 06/28/2022. GERD.  Sonographer:     Leta Jungling RDCS Referring Phys:  [id number] Lanier Prude Diagnosing Phys: Riley Lam MD  PROCEDURE: After discussion of the risks and benefits of a TEE, an informed consent was obtained from the patient. The patient was intubated. TEE procedure time was 39 minutes. The transesophogeal probe was passed without difficulty through the esophogus of the patient. Imaged were obtained with the patient in a supine position. Sedation performed by different physician. The patient was monitored while under deep sedation. Anesthestetic sedation was provided intravenously by Anesthesiology: 130mg  of Propofol, 80mg  of Lidocaine. Supplementary images were obtained from transthoracic windows as indicated to answer the clinical question. The patient developed no complications during the procedure.  IMPRESSIONS   1. Interventional TEE for Watchman FLX procedure. 2. Prior to procedure, there was a patent left atrial appendage with a windsock morphology. Maximal diameter 20 cm (ostial deployment) with suitable depth for device. 3. Mid- mid stick with no complications. 4. Placement of 27 mm Watchman FLX device. No leak. No Mitral shoulder. Average compression ~ 23%. 5. Left ventricular ejection fraction, by estimation, is 60 to 65%. The left ventricle has normal function. 6. Right ventricular systolic function is normal. The right ventricular size is normal. 7. Left atrial size was mildly dilated. No left atrial/left  atrial appendage thrombus was detected. 8. The mitral valve is normal in structure. Mild mitral valve regurgitation. 9. The aortic valve is normal in structure. Aortic valve regurgitation is not visualized. No aortic stenosis is present. 10. Evidence of atrial level shunting detected by color flow Doppler after transeptal puncture, with all left to right flow. 11. Trivial apical pericardial effusion is unchanged from prior.  FINDINGS Left Ventricle: Left ventricular ejection fraction, by estimation, is 60 to 65%. The left ventricle has normal function. The left ventricular internal cavity size was normal in size.  Right Ventricle: The right ventricular size is normal. No increase in right ventricular wall thickness. Right ventricular systolic function is normal.  Left Atrium: Left atrial size was mildly dilated. No left atrial/left atrial appendage thrombus was detected.  Right Atrium: Right atrial size was normal in size.  Pericardium: Trivial pericardial effusion is present. The pericardial effusion is circumferential and surrounding the apex.  Mitral Valve: The mitral valve is normal in structure. Mild mitral valve regurgitation.  Tricuspid Valve: The tricuspid valve is normal in structure. Tricuspid valve regurgitation is mild . No evidence of tricuspid stenosis.  Aortic Valve: The aortic valve is normal in structure. Aortic valve regurgitation is not visualized. No aortic stenosis is present.  Pulmonic Valve: The pulmonic valve was normal in structure. Pulmonic valve regurgitation is trivial. No evidence of pulmonic stenosis.  Aorta: The aortic root is normal in size and structure.  IAS/Shunts: Evidence of atrial level shunting detected by color flow Doppler.   TRICUSPID VALVE TR Peak grad:   24.2 mmHg TR Vmax:        246.00 cm/s  [employer]  MD Electronically signed by Riley Lam MD Signature Date/Time: 06/28/2022/11:03:59 AM    Final             EKG:  EKG is ordered today.  The ekg ordered today demonstrates atrial flutter with HR 116bpm.   Recent Labs: 07/04/2023: ALT 15; BUN 17; Creatinine, Ser 0.82; Hemoglobin 13.0; Magnesium 2.5; Platelets 302; Potassium 4.6; Sodium 143   Recent Lipid Panel    Component Value Date/Time   CHOL 126 07/04/2022 0912   CHOL 155 05/09/2021 0732   TRIG 87.0 07/04/2022 0912   TRIG 54 06/25/2006 0725   HDL 56.70 07/04/2022 0912   HDL 62 05/09/2021 0732   CHOLHDL 2 07/04/2022 0912   VLDL 17.4 07/04/2022 0912   LDLCALC 52 07/04/2022 0912   LDLCALC 72 05/09/2021 0732   LDLDIRECT 142.3 03/16/2013 0732   Risk Assessment/Calculations:    CHA2DS2-VASc Score = 3  The patient's score is based upon: CHF History: 0 HTN History: 1 Diabetes History: 0 Stroke History: 0 Vascular Disease History: 0 Age Score: 1 Gender Score: 1   HAS-BLED score 1 Hypertension (Uncontrolled in 30 days)  No  Abnormal renal and liver function (Dialysis, transplant, Cr >2.26 mg/dL /Cirrhosis or Bilirubin >2x Normal or AST/ALT/AP >3x Normal) No  Stroke No  Bleeding No  Labile INR (Unstable/high INR) No  Elderly (>65) Yes  Drugs or alcohol (>= 8 drinks/week, anti-plt or NSAID) No   Physical Exam:    VS:  BP (!) 114/90   Pulse (!) 140   Ht 5' (1.524 m)   LMP 07/23/2004   SpO2 98%   BMI 39.45 kg/m     Wt Readings from Last 3 Encounters:  07/04/23 202 lb (91.6 kg)  05/17/23 207 lb (93.9 kg)  05/09/23 207 lb 6.4 oz (94.1 kg)    General: Well developed, well nourished, NAD Lungs:Clear to ausculation bilaterally. No wheezes, rales, or rhonchi. Breathing is unlabored. Cardiovascular: Irregularly irregular. No murmurs Extremities: No edema.  Neuro: Alert and oriented. No focal deficits. No facial asymmetry. MAE spontaneously. Psych: Responds to questions appropriately with normal affect.    ASSESSMENT/PLAN:    Persistent atrial fibrillation: s/p AF ablation 03/29/2022 with subsequent Watchman FLX 27mm  06/28/2022. Post implant CT with well seated device with no leak or thrombus found to be in persistent atrial flutter with RVR in the ED and is here today for pre-DCCV evaluation. Given duration since LAAO with no leak or thrombus on post implant CT she will need at least 3 doses of DOAC prior to cardioversion and continuation for 4 weeks post DCCV then she may stop. Given rates today, will attempt to add Amiodarone 200mg  BID for 1 week then reduce top 200mg  daily until seen for one month follow up. Instructions reviewed with the patient and family. Labs performed last week which appear stable. Plan follow up 07/2023 with Dr. Izora Ribas. Patient has been set up to see Dr. Lalla Brothers 07/2023 to discuss atrial flutter ablation.    HLD: Labs 06/2022 with LDL 52.   OSA on CPAP: Reports compliance.   I spent 25 minutes caring for this patient today including face-to-face discussions, ordering and reviewing labs, reviewing records from [facility] and other outside facilities, documenting in the record, and arranging for follow up.     Informed Consent   Shared Decision Making/Informed Consent The risks (stroke, cardiac arrhythmias rarely resulting in the need for a temporary or permanent pacemaker, skin irritation or burns and complications associated with conscious sedation including aspiration,  arrhythmia, respiratory failure and death), benefits (restoration of normal sinus rhythm) and alternatives of a direct current cardioversion were explained in detail to Ms. Beachley and she agrees to proceed.        Medication Adjustments/Labs and Tests Ordered: Current medicines are reviewed at length with the patient today.  Concerns regarding medicines are outlined above.  Orders Placed This Encounter  Procedures   EKG 12-Lead   Meds ordered this encounter  Medications   DISCONTD: amiodarone (PACERONE) 200 MG tablet    Sig: Take one (1) tablet by mouth ( 200 mg) twice daily X 2 weeks than take One (1)  tablet by mouth ( 200 mg) daily.    Dispense:  37 tablet    Refill:  1   DISCONTD: apixaban (ELIQUIS) 5 MG TABS tablet    Sig: Take 1 tablet (5 mg total) by mouth 2 (two) times daily.    Dispense:  14 tablet    Refill:  0   amiodarone (PACERONE) 200 MG tablet    Sig: Take one (1) tablet by mouth ( 200 mg) twice daily X 1 week than take One (1) tablet by mouth ( 200 mg) daily.    Dispense:  44 tablet    Refill:  1    This replaces the 1st amiodarone rx   apixaban (ELIQUIS) 5 MG TABS tablet    Sig: Take 1 tablet (5 mg total) by mouth 2 (two) times daily.    Dispense:  60 tablet    Refill:  2    Patient Instructions  Medication Instructions:   START AMIODARONE Take one (1) tablet by mouth ( 200 mg) twice daily X 1 weeks and than take one (1) table by mouth ( 200 mg ) daily.   Your physician recommends that you continue on your current medications as directed. Please refer to the Current Medication list given to you today.   *If you need a refill on your cardiac medications before your next appointment, please call your pharmacy*   Lab Work:  None ordered.  If you have labs (blood work) drawn today and your tests are completely normal, you will receive your results only by: MyChart Message (if you have MyChart) OR A paper copy in the mail If you have any lab test that is abnormal or we need to change your treatment, we will call you to review the results.   Testing/Procedures:      Dear Amy Williamson  You are scheduled for a Cardioversion on Thursday, December 19 with Dr. Anne Fu.  Please arrive at the [facility] (Main Entrance A) at [facility]: [address] [place], [place] [address] at 12:00 PM (This time is 1 hour(s) before your procedure to ensure your preparation).   Free valet parking service is available. You will check in at ADMITTING.    DIET:  Nothing to eat or drink after midnight except a sip of water with medications (see medication instructions  below)  MEDICATION INSTRUCTIONS:        :1}Continue taking your anticoagulant (blood thinner): Apixaban (Eliquis).  You will need to continue this after your procedure until you are told by your provider that it is safe to stop.    LABS: None  FYI:  For your safety, and to allow [place] to monitor your vital signs accurately during the surgery/procedure we request: If you have artificial nails, gel coating, SNS etc, please have those removed prior to your surgery/procedure. Not having the nail coverings /  polish removed may result in cancellation or delay of your surgery/procedure.  Your support person will be asked to wait in the waiting room during your procedure.  It is OK to have someone drop you off and come back when you are ready to be discharged.  You cannot drive after the procedure and will need someone to drive you home.  Bring your insurance cards.  *Special Note: Every effort is made to have your procedure done on time. Occasionally there are emergencies that occur at the hospital that may cause delays. Please be patient if a delay does occur.       Follow-Up: At [facility], you and your health needs are our priority.  As part of our continuing mission to provide you with exceptional heart care, we have created designated Provider Care Teams.  These Care Teams include your primary Cardiologist (physician) and Advanced Practice Providers (APPs -  Physician Assistants and Nurse Practitioners) who all work together to provide you with the care you need, when you need it.  We recommend signing up for the patient portal called "MyChart".  Sign up information is provided on this After Visit Summary.  MyChart is used to connect with patients for Virtual Visits (Telemedicine).  Patients are able to view lab/test results, encounter notes, upcoming appointments, etc.  Non-urgent messages can be sent to your provider as well.   To learn more about what you can do with MyChart, go to  [email or web address].    Your next appointment:   1 month(s)  Provider:   Dr. Izora Ribas   Other Instructions  Keep Dr. Lalla Brothers appointment in January.     Signed, Georgie Chard, NP  07/09/2023 8:18 AM    [facility]

## 2023-07-05 NOTE — H&P (View-Only) (Signed)
[facility]                                     Cardiology Office Note:    Date:  07/09/2023   ID:  Amy Williamson, DOB [birth year], MRN [id number]  PCP:  Amy Broker, Williamson  [facility] Cardiologist:  None  [facility] Electrophysiologist:  Amy Williamson   Referring Williamson: Amy Williamson, *   Chief Complaint  Patient presents with   Pre-op Exam   History of Present Illness:    Amy Williamson is a 72 y.o. female with a hx of HTN, aortic atherosclerosis, OSA, HLD, and atrial fibrillation s/p AF ablation and subsequent Watchman 06/28/22 who presents today with recurrent AF with RVR.    Amy Williamson was initially dx with AF 06/2020 and underwent initial DCCV on 07/28/20. She was started on Eliquis at that time. She had recurrent AF by 09/2022 with repeat cardioversion x2. Flecainide was initiated and she was referred to EP for ablation consideration. She saw Amy Williamson 12/20/21 and underwent AF ablation 03/29/22 and is now s/p successful LAAO with Watchman FLX 27mm device 06/29/23. She was restarted on Eliquis 5mg  and transitioned to Plavix after 45 days later. Post implant CT 09/05/22 showed a well seated device with no leak or device thrombus.    She was seen by our team for 6 month post Watchman follow up and her Plavix was stopped at that time. Unfortunately she presented to the ED last week with symptoms of fatigue, palpitations, and chest pain found to be in rate controlled atrial flutter. Initial plan was to direct cardiovert her in the ED however with consistent breakthrough AF and Watchman placement our team was contacted with plan to restart Eliquis for a minimum of three doses then plan outpatient cardioversion.   Today she is here with her husband and reports that she has ben in persistent AF since her discharge from the ED. She was told to stop her Flecainide and hold ASA given restart of Eliquis. EKG today shows atrial flutter with HR 116bpm.  Overall she has been well and states that when she is resting her rates remain stable but when she ambulates, rates will peak into the 130-140 range. She has not tried PRN diltiazem but has been compliant with Eliquis. BP is on the softer side therefore difficult to titrate meds. Discussed adding Amiodarone in the interm. Otherwise, she denies chest pain, LE edema, orthopnea, PND, dizziness, or syncope. Denies bleeding in stool or urine. Mostly having fatigue and palpitations with AF.  Past Medical History:  Diagnosis Date   Anticoagulant long-term use    eliquis --- managed by cardiology   Arthritis    BACK AND SHOULDERS   Depressive disorder, not elsewhere classified    Essential hypertension    followed by pcp and cardiology   Frequency of urination    GERD (gastroesophageal reflux disease)    Headache    migraines when younger   History of dysphagia 2015   s/p egd w/ dilation, pt did not have stricture   History of gastritis    OSA (obstructive sleep apnea)    followed by dr t. turner---  study in epic 09-03-2020 moderate osa ,  pt scheduled for cpap titration 12-08-2020- pt wears oral device, cannot wear CPAP   PAF (paroxysmal atrial fibrillation) [facility])    cardiologist-- dr m.  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   Watchman FLX 27mm with Amy Williamson   Pure hypercholesterolemia     Past Surgical History:  Procedure Laterality Date   ATRIAL FIBRILLATION ABLATION N/A 03/30/2022   Procedure: ATRIAL FIBRILLATION ABLATION;  Surgeon: Amy Williamson;  Location: [facility] INVASIVE CV LAB;  Service: Cardiovascular;   Laterality: N/A;   BREAST LUMPECTOMY WITH RADIOACTIVE SEED LOCALIZATION Left 06/09/2020   Procedure: LEFT BREAST LUMPECTOMY WITH RADIOACTIVE SEED LOCALIZATION;  Surgeon: Amy Miyamoto, Williamson;  Location: [facility];  Service: General;  Laterality: Left;  LMA   CARDIOVERSION N/A 07/28/2020   Procedure: CARDIOVERSION;  Surgeon: Amy Williamson;  Location: [facility] ENDOSCOPY;  Service: Cardiovascular;  Laterality: N/A;   CARPAL TUNNEL RELEASE Bilateral right 2004;  left 2006   CATARACT EXTRACTION W/ INTRAOCULAR LENS  IMPLANT, BILATERAL  2016   CESAREAN SECTION  [id number]   BILATERAL TUBAL LIGATION W/ LAST C/S   COLONOSCOPY WITH PROPOFOL  last one 08/ 2013   DILATATION & CURETTAGE/HYSTEROSCOPY WITH MYOSURE N/A 11/22/2020   Procedure: DILATATION & CURETTAGE/HYSTEROSCOPY WITH MYOSURE;  Surgeon: Amy Bears, Williamson;  Location: [facility];  Service: Gynecology;  Laterality: N/A;   EAR CYST EXCISION  08/03/2011   patient states this is incorrect was a Baker's cyst popliteal area   ESOPHAGOGASTRODUODENOSCOPY (EGD) WITH ESOPHAGEAL DILATION  08/2013   KNEE ARTHROSCOPY W/ MENISCAL REPAIR Left 08-03-2011  @[facility]    and excision baker's cyst   LEFT ATRIAL APPENDAGE OCCLUSION N/A 06/28/2022   Procedure: LEFT ATRIAL APPENDAGE OCCLUSION;  Surgeon: Amy Williamson;  Location: [facility] INVASIVE CV LAB;  Service: Cardiovascular;  Laterality: N/A;   LUMBAR FUSION  02/19/2022   posterior- lumbar 5-6   PULLEY RELEASE RIGHT TRIGGER FINGER Right 01/08/2011   TEE WITHOUT CARDIOVERSION N/A 06/28/2022   Procedure: TRANSESOPHAGEAL ECHOCARDIOGRAM (TEE);  Surgeon: Amy Williamson;  Location: [facility] INVASIVE CV LAB;  Service: Cardiovascular;  Laterality: N/A;   TRIGGER FINGER RELEASE Bilateral left 05/06/14;  right  02-29-2014    Current Medications: Current Meds  Medication Sig   acetaminophen (TYLENOL) 650 MG CR tablet Take 1,300 mg by mouth in the morning and at bedtime.    bisacodyl (DULCOLAX) 5 MG EC tablet Take 5 mg by mouth daily as needed for moderate constipation.   Cholecalciferol (VITAMIN D3 PO) Take 1 tablet by mouth daily.   Cyanocobalamin (B-12 PO) Take 1 tablet by mouth daily.   diltiazem (TIAZAC) 240 MG 24 hr capsule Take 240 mg by mouth daily.   flecainide (TAMBOCOR) 50 MG tablet Take 0.5 tablets (25 mg total) by mouth 2 (two) times daily as needed (breakthrough afib). (Patient taking differently: Take 50 mg by mouth 2 (two) times daily as needed (breakthrough afib).)   pantoprazole (PROTONIX) 40 MG tablet Take 1 tablet (40 mg total) by mouth daily.   [DISCONTINUED] amiodarone (PACERONE) 200 MG tablet Take one (1) tablet by mouth ( 200 mg) twice daily X 2 weeks than take One (1) tablet by mouth ( 200 mg) daily.   [DISCONTINUED] apixaban (ELIQUIS) 5 MG TABS tablet Take 1  tablet (5 mg total) by mouth 2 (two) times daily for 7 days.   [DISCONTINUED] methocarbamol (ROBAXIN-750) 750 MG tablet Take 1 tablet (750 mg total) by mouth 4 (four) times daily.   [DISCONTINUED] oxyCODONE-acetaminophen (PERCOCET/ROXICET) 5-325 MG tablet Take 1 tablet by mouth every 6 (six) hours as needed for severe pain (pain score 7-10).     Allergies:   Tramadol, Atenolol, Avelox [moxifloxacin hcl in nacl], Azithromycin, Doxycycline, Myrbetriq [mirabegron], Naproxen, and Oxybutynin   Social History   Socioeconomic History   Marital status: Married    Spouse name: Dorene Sorrow   Number of children: 2   Years of education: Not on file   Highest education level: Not on file  Occupational History   Occupation: retired  Tobacco Use   Smoking status: Never   Smokeless tobacco: Never   Tobacco comments:    Never smoke 10/11/21  Vaping Use   Vaping status: Never Used  Substance and Sexual Activity   Alcohol use: No   Drug use: Never   Sexual activity: Not Currently    Birth control/protection: Surgical, Post-menopausal    Comment: BTL  Other Topics Concern   Not on file  Social  History Narrative   Not on file   Social Drivers of Health   Financial Resource Strain: Low Risk  (06/26/2023)   Overall Financial Resource Strain (CARDIA)    Difficulty of Paying Living Expenses: Not hard at all  Food Insecurity: No Food Insecurity (06/26/2023)   Hunger Vital Sign    Worried About Running Out of Food in the Last Year: Never true    Ran Out of Food in the Last Year: Never true  Transportation Needs: No Transportation Needs (06/26/2023)   PRAPARE - Administrator, Civil Service (Medical): No    Lack of Transportation (Non-Medical): No  Physical Activity: Insufficiently Active (06/26/2023)   Exercise Vital Sign    Days of Exercise per Week: 2 days    Minutes of Exercise per Session: 30 min  Stress: No Stress Concern Present (06/26/2023)   [employer] of Occupational Health - Occupational Stress Questionnaire    Feeling of Stress : Not at all  Social Connections: Socially Integrated (06/26/2023)   Social Connection and Isolation Panel [NHANES]    Frequency of Communication with Friends and Family: Three times a week    Frequency of Social Gatherings with Friends and Family: Three times a week    Attends Religious Services: More than 4 times per year    Active Member of Clubs or Organizations: Yes    Attends Engineer, structural: More than 4 times per year    Marital Status: Married    Family History: The patient's family history includes Clotting disorder in her father; Heart disease in her father; Heart failure in her mother; Hyperlipidemia in her mother; Hypertension in her mother; Liver cancer in her brother; Liver disease in her maternal aunt; Osteoporosis in her mother. There is no history of Colon cancer.  ROS:   Please see the history of present illness.    All other systems reviewed and are negative.  EKGs/Labs/Other Studies Reviewed:    The following studies were reviewed today:   Cardiac Studies & Procedures     STRESS  TESTS  MYOCARDIAL PERFUSION IMAGING 10/19/2021  Narrative   The study is normal. The study is low risk.   No ST deviation was noted.   LV perfusion is normal.   Left ventricular function is normal. Nuclear stress EF:  69 %. The left ventricular ejection fraction is hyperdynamic (>65%). End diastolic cavity size is normal.   Prior study not available for comparison.  ECHOCARDIOGRAM  ECHOCARDIOGRAM COMPLETE 01/10/2022  Narrative ECHOCARDIOGRAM REPORT    Patient Name:   WARNETTA SHANDOR Date of Exam: 01/10/2022 Medical Rec #:  [id number]        Height:       60.0 in Accession #:    [id number]       Weight:       216.0 lb Date of Birth:  08-02-1950         BSA:          1.929 m Patient Age:    71 years         BP:           140/74 mmHg Patient Gender: F                HR:           70 bpm. Exam Location:  [address]  Procedure: 2D Echo, Cardiac Doppler and Color Doppler  Indications:    I48.19 Atrial fibrillation  History:        Patient has prior history of Echocardiogram examinations, most recent 08/10/2020. Arrythmias:Atrial Fibrillation; Risk Factors:Morbid obesity and Hypertension.  Sonographer:    Samule Ohm RDCS Referring Phys: [id number] Rossie Muskrat LAMBERT  IMPRESSIONS   1. Left ventricular ejection fraction, by estimation, is 60 to 65%. The left ventricle has normal function. The left ventricle has no regional wall motion abnormalities. Left ventricular diastolic parameters are indeterminate. 2. Right ventricular systolic function is normal. The right ventricular size is normal. There is normal pulmonary artery systolic pressure. 3. Left atrial size was mildly dilated. 4. The mitral valve is normal in structure. Mild mitral valve regurgitation. No evidence of mitral stenosis. 5. The aortic valve is grossly normal. Aortic valve regurgitation is not visualized. No aortic stenosis is present. 6. The inferior vena cava is normal in size with greater than 50% respiratory  variability, suggesting right atrial pressure of 3 mmHg.  Comparison(s): No significant change from prior study.  Conclusion(s)/Recommendation(s): Otherwise normal echocardiogram, with minor abnormalities described in the report.  FINDINGS Left Ventricle: Left ventricular ejection fraction, by estimation, is 60 to 65%. The left ventricle has normal function. The left ventricle has no regional wall motion abnormalities. The left ventricular internal cavity size was normal in size. There is no left ventricular hypertrophy. Left ventricular diastolic parameters are indeterminate.  Right Ventricle: The right ventricular size is normal. No increase in right ventricular wall thickness. Right ventricular systolic function is normal. There is normal pulmonary artery systolic pressure. The tricuspid regurgitant velocity is 2.61 m/s, and with an assumed right atrial pressure of 3 mmHg, the estimated right ventricular systolic pressure is 30.2 mmHg.  Left Atrium: Left atrial size was mildly dilated.  Right Atrium: Right atrial size was normal in size.  Pericardium: Trivial pericardial effusion is present.  Mitral Valve: The mitral valve is normal in structure. There is mild thickening of the mitral valve leaflet(s). There is mild calcification of the mitral valve leaflet(s). Mild mitral valve regurgitation, with eccentric laterally directed jet. No evidence of mitral valve stenosis.  Tricuspid Valve: The tricuspid valve is normal in structure. Tricuspid valve regurgitation is mild . No evidence of tricuspid stenosis.  Aortic Valve: The aortic valve is grossly normal. Aortic valve regurgitation is not visualized. No aortic stenosis is present.  Pulmonic Valve: The pulmonic valve was  not well visualized. Pulmonic valve regurgitation is trivial. No evidence of pulmonic stenosis.  Aorta: The aortic root, ascending aorta, aortic arch and descending aorta are all structurally normal, with no evidence of  dilitation or obstruction.  Venous: The inferior vena cava is normal in size with greater than 50% respiratory variability, suggesting right atrial pressure of 3 mmHg.  IAS/Shunts: The atrial septum is grossly normal.   LEFT VENTRICLE PLAX 2D LVIDd:         4.70 cm   Diastology LVIDs:         3.00 cm   LV e' medial:    11.70 cm/s LV PW:         1.10 cm   LV E/e' medial:  12.6 LV IVS:        0.90 cm   LV e' lateral:   5.55 cm/s LVOT diam:     1.90 cm   LV E/e' lateral: 26.7 LV SV:         75 LV SV Index:   39 LVOT Area:     2.84 cm   RIGHT VENTRICLE             IVC RV S prime:     14.80 cm/s  IVC diam: 1.70 cm TAPSE (M-mode): 2.9 cm RVSP:           30.2 mmHg  LEFT ATRIUM             Index        RIGHT ATRIUM           Index LA diam:        4.10 cm 2.13 cm/m   RA Pressure: 3.00 mmHg LA Vol (A2C):   51.8 ml 26.86 ml/m  RA Area:     11.20 cm LA Vol (A4C):   63.8 ml 33.08 ml/m  RA Volume:   20.50 ml  10.63 ml/m LA Biplane Vol: 59.2 ml 30.69 ml/m AORTIC VALVE LVOT Vmax:   111.00 cm/s LVOT Vmean:  73.700 cm/s LVOT VTI:    0.265 m  AORTA Ao Root diam: 3.10 cm Ao Asc diam:  3.40 cm  MITRAL VALVE                TRICUSPID VALVE MV Area (PHT): 3.23 cm     TR Peak grad:   27.2 mmHg MV Decel Time: 235 msec     TR Vmax:        261.00 cm/s MV E velocity: 148.00 cm/s  Estimated RAP:  3.00 mmHg MV A velocity: 136.00 cm/s  RVSP:           30.2 mmHg MV E/A ratio:  1.09 SHUNTS Systemic VTI:  0.26 m Systemic Diam: 1.90 cm  Jodelle Red Williamson Electronically signed by Jodelle Red Williamson Signature Date/Time: 01/10/2022/2:39:07 PM    Final  TEE  ECHO TEE 06/28/2022  Narrative TRANSESOPHOGEAL ECHO REPORT    Patient Name:   Amy Williamson Date of Exam: 06/28/2022 Medical Rec #:  [id number]        Height:       60.0 in Accession #:    [id number]       Weight:       200.0 lb Date of Birth:  February 05, 1951         BSA:          1.867 m Patient Age:    71 years          BP:  136/51 mmHg Patient Gender: F                HR:           76 bpm. Exam Location:  Inpatient  Procedure: Transesophageal Echo, Cardiac Doppler, Color Doppler and 3D Echo  Indications:     Persistent atrial fibrillation (HCC) [O13.08 (ICD-10-CM)]  History:         Patient has prior history of Echocardiogram examinations, most recent 01/10/2022. Risk Factors:Hypertension, Sleep Apnea and Dyslipidemia. 27mm Watchman implanted on 06/28/2022. GERD.  Sonographer:     Leta Jungling RDCS Referring Phys:  [id number] Amy Williamson Diagnosing Phys: Riley Lam Williamson  PROCEDURE: After discussion of the risks and benefits of a TEE, an informed consent was obtained from the patient. The patient was intubated. TEE procedure time was 39 minutes. The transesophogeal probe was passed without difficulty through the esophogus of the patient. Imaged were obtained with the patient in a supine position. Sedation performed by different physician. The patient was monitored while under deep sedation. Anesthestetic sedation was provided intravenously by Anesthesiology: 130mg  of Propofol, 80mg  of Lidocaine. Supplementary images were obtained from transthoracic windows as indicated to answer the clinical question. The patient developed no complications during the procedure.  IMPRESSIONS   1. Interventional TEE for Watchman FLX procedure. 2. Prior to procedure, there was a patent left atrial appendage with a windsock morphology. Maximal diameter 20 cm (ostial deployment) with suitable depth for device. 3. Mid- mid stick with no complications. 4. Placement of 27 mm Watchman FLX device. No leak. No Mitral shoulder. Average compression ~ 23%. 5. Left ventricular ejection fraction, by estimation, is 60 to 65%. The left ventricle has normal function. 6. Right ventricular systolic function is normal. The right ventricular size is normal. 7. Left atrial size was mildly dilated. No left atrial/left  atrial appendage thrombus was detected. 8. The mitral valve is normal in structure. Mild mitral valve regurgitation. 9. The aortic valve is normal in structure. Aortic valve regurgitation is not visualized. No aortic stenosis is present. 10. Evidence of atrial level shunting detected by color flow Doppler after transeptal puncture, with all left to right flow. 11. Trivial apical pericardial effusion is unchanged from prior.  FINDINGS Left Ventricle: Left ventricular ejection fraction, by estimation, is 60 to 65%. The left ventricle has normal function. The left ventricular internal cavity size was normal in size.  Right Ventricle: The right ventricular size is normal. No increase in right ventricular wall thickness. Right ventricular systolic function is normal.  Left Atrium: Left atrial size was mildly dilated. No left atrial/left atrial appendage thrombus was detected.  Right Atrium: Right atrial size was normal in size.  Pericardium: Trivial pericardial effusion is present. The pericardial effusion is circumferential and surrounding the apex.  Mitral Valve: The mitral valve is normal in structure. Mild mitral valve regurgitation.  Tricuspid Valve: The tricuspid valve is normal in structure. Tricuspid valve regurgitation is mild . No evidence of tricuspid stenosis.  Aortic Valve: The aortic valve is normal in structure. Aortic valve regurgitation is not visualized. No aortic stenosis is present.  Pulmonic Valve: The pulmonic valve was normal in structure. Pulmonic valve regurgitation is trivial. No evidence of pulmonic stenosis.  Aorta: The aortic root is normal in size and structure.  IAS/Shunts: Evidence of atrial level shunting detected by color flow Doppler.   TRICUSPID VALVE TR Peak grad:   24.2 mmHg TR Vmax:        246.00 cm/s  [employer]  Williamson Electronically signed by Riley Lam Williamson Signature Date/Time: 06/28/2022/11:03:59 AM    Final             EKG:  EKG is ordered today.  The ekg ordered today demonstrates atrial flutter with HR 116bpm.   Recent Labs: 07/04/2023: ALT 15; BUN 17; Creatinine, Ser 0.82; Hemoglobin 13.0; Magnesium 2.5; Platelets 302; Potassium 4.6; Sodium 143   Recent Lipid Panel    Component Value Date/Time   CHOL 126 07/04/2022 0912   CHOL 155 05/09/2021 0732   TRIG 87.0 07/04/2022 0912   TRIG 54 06/25/2006 0725   HDL 56.70 07/04/2022 0912   HDL 62 05/09/2021 0732   CHOLHDL 2 07/04/2022 0912   VLDL 17.4 07/04/2022 0912   LDLCALC 52 07/04/2022 0912   LDLCALC 72 05/09/2021 0732   LDLDIRECT 142.3 03/16/2013 0732   Risk Assessment/Calculations:    CHA2DS2-VASc Score = 3  The patient's score is based upon: CHF History: 0 HTN History: 1 Diabetes History: 0 Stroke History: 0 Vascular Disease History: 0 Age Score: 1 Gender Score: 1   HAS-BLED score 1 Hypertension (Uncontrolled in 30 days)  No  Abnormal renal and liver function (Dialysis, transplant, Cr >2.26 mg/dL /Cirrhosis or Bilirubin >2x Normal or AST/ALT/AP >3x Normal) No  Stroke No  Bleeding No  Labile INR (Unstable/high INR) No  Elderly (>65) Yes  Drugs or alcohol (>= 8 drinks/week, anti-plt or NSAID) No   Physical Exam:    VS:  BP (!) 114/90   Pulse (!) 140   Ht 5' (1.524 m)   LMP 07/23/2004   SpO2 98%   BMI 39.45 kg/m     Wt Readings from Last 3 Encounters:  07/04/23 202 lb (91.6 kg)  05/17/23 207 lb (93.9 kg)  05/09/23 207 lb 6.4 oz (94.1 kg)    General: Well developed, well nourished, NAD Lungs:Clear to ausculation bilaterally. No wheezes, rales, or rhonchi. Breathing is unlabored. Cardiovascular: Irregularly irregular. No murmurs Extremities: No edema.  Neuro: Alert and oriented. No focal deficits. No facial asymmetry. MAE spontaneously. Psych: Responds to questions appropriately with normal affect.    ASSESSMENT/PLAN:    Persistent atrial fibrillation: s/p AF ablation 03/29/2022 with subsequent Watchman FLX 27mm  06/28/2022. Post implant CT with well seated device with no leak or thrombus found to be in persistent atrial flutter with RVR in the ED and is here today for pre-DCCV evaluation. Given duration since LAAO with no leak or thrombus on post implant CT she will need at least 3 doses of DOAC prior to cardioversion and continuation for 4 weeks post DCCV then she may stop. Given rates today, will attempt to add Amiodarone 200mg  BID for 1 week then reduce top 200mg  daily until seen for one month follow up. Instructions reviewed with the patient and family. Labs performed last week which appear stable. Plan follow up 07/2023 with Dr. Izora Ribas. Patient has been set up to see Amy Williamson 07/2023 to discuss atrial flutter ablation.    HLD: Labs 06/2022 with LDL 52.   OSA on CPAP: Reports compliance.   I spent 25 minutes caring for this patient today including face-to-face discussions, ordering and reviewing labs, reviewing records from [facility] and other outside facilities, documenting in the record, and arranging for follow up.     Informed Consent   Shared Decision Making/Informed Consent The risks (stroke, cardiac arrhythmias rarely resulting in the need for a temporary or permanent pacemaker, skin irritation or burns and complications associated with conscious sedation including aspiration,  arrhythmia, respiratory failure and death), benefits (restoration of normal sinus rhythm) and alternatives of a direct current cardioversion were explained in detail to Ms. Azlin and she agrees to proceed.        Medication Adjustments/Labs and Tests Ordered: Current medicines are reviewed at length with the patient today.  Concerns regarding medicines are outlined above.  Orders Placed This Encounter  Procedures   EKG 12-Lead   Meds ordered this encounter  Medications   DISCONTD: amiodarone (PACERONE) 200 MG tablet    Sig: Take one (1) tablet by mouth ( 200 mg) twice daily X 2 weeks than take One (1)  tablet by mouth ( 200 mg) daily.    Dispense:  37 tablet    Refill:  1   DISCONTD: apixaban (ELIQUIS) 5 MG TABS tablet    Sig: Take 1 tablet (5 mg total) by mouth 2 (two) times daily.    Dispense:  14 tablet    Refill:  0   amiodarone (PACERONE) 200 MG tablet    Sig: Take one (1) tablet by mouth ( 200 mg) twice daily X 1 week than take One (1) tablet by mouth ( 200 mg) daily.    Dispense:  44 tablet    Refill:  1    This replaces the 1st amiodarone rx   apixaban (ELIQUIS) 5 MG TABS tablet    Sig: Take 1 tablet (5 mg total) by mouth 2 (two) times daily.    Dispense:  60 tablet    Refill:  2    Patient Instructions  Medication Instructions:   START AMIODARONE Take one (1) tablet by mouth ( 200 mg) twice daily X 1 weeks and than take one (1) table by mouth ( 200 mg ) daily.   Your physician recommends that you continue on your current medications as directed. Please refer to the Current Medication list given to you today.   *If you need a refill on your cardiac medications before your next appointment, please call your pharmacy*   Lab Work:  None ordered.  If you have labs (blood work) drawn today and your tests are completely normal, you will receive your results only by: MyChart Message (if you have MyChart) OR A paper copy in the mail If you have any lab test that is abnormal or we need to change your treatment, we will call you to review the results.   Testing/Procedures:      Dear Amy Williamson  You are scheduled for a Cardioversion on Thursday, December 19 with Dr. Anne Fu.  Please arrive at the [facility] (Main Entrance A) at [facility]: [address] [place], [place] [address] at 12:00 PM (This time is 1 hour(s) before your procedure to ensure your preparation).   Free valet parking service is available. You will check in at ADMITTING.    DIET:  Nothing to eat or drink after midnight except a sip of water with medications (see medication instructions  below)  MEDICATION INSTRUCTIONS:        :1}Continue taking your anticoagulant (blood thinner): Apixaban (Eliquis).  You will need to continue this after your procedure until you are told by your provider that it is safe to stop.    LABS: None  FYI:  For your safety, and to allow [place] to monitor your vital signs accurately during the surgery/procedure we request: If you have artificial nails, gel coating, SNS etc, please have those removed prior to your surgery/procedure. Not having the nail coverings /  polish removed may result in cancellation or delay of your surgery/procedure.  Your support person will be asked to wait in the waiting room during your procedure.  It is OK to have someone drop you off and come back when you are ready to be discharged.  You cannot drive after the procedure and will need someone to drive you home.  Bring your insurance cards.  *Special Note: Every effort is made to have your procedure done on time. Occasionally there are emergencies that occur at the hospital that may cause delays. Please be patient if a delay does occur.       Follow-Up: At [facility], you and your health needs are our priority.  As part of our continuing mission to provide you with exceptional heart care, we have created designated Provider Care Teams.  These Care Teams include your primary Cardiologist (physician) and Advanced Practice Providers (APPs -  Physician Assistants and Nurse Practitioners) who all work together to provide you with the care you need, when you need it.  We recommend signing up for the patient portal called "MyChart".  Sign up information is provided on this After Visit Summary.  MyChart is used to connect with patients for Virtual Visits (Telemedicine).  Patients are able to view lab/test results, encounter notes, upcoming appointments, etc.  Non-urgent messages can be sent to your provider as well.   To learn more about what you can do with MyChart, go to  [email or web address].    Your next appointment:   1 month(s)  Provider:   Dr. Izora Ribas   Other Instructions  Keep Amy Williamson appointment in January.     Signed, Georgie Chard, NP  07/09/2023 8:18 AM    [facility]

## 2023-07-08 ENCOUNTER — Ambulatory Visit: Payer: Medicare Other | Attending: Cardiology | Admitting: Cardiology

## 2023-07-08 VITALS — BP 114/90 | HR 140 | Ht 60.0 in

## 2023-07-08 DIAGNOSIS — I10 Essential (primary) hypertension: Secondary | ICD-10-CM | POA: Diagnosis not present

## 2023-07-08 DIAGNOSIS — Z01810 Encounter for preprocedural cardiovascular examination: Secondary | ICD-10-CM

## 2023-07-08 DIAGNOSIS — I4891 Unspecified atrial fibrillation: Secondary | ICD-10-CM | POA: Diagnosis not present

## 2023-07-08 DIAGNOSIS — Z8679 Personal history of other diseases of the circulatory system: Secondary | ICD-10-CM

## 2023-07-08 DIAGNOSIS — Z95818 Presence of other cardiac implants and grafts: Secondary | ICD-10-CM | POA: Diagnosis not present

## 2023-07-08 DIAGNOSIS — I4892 Unspecified atrial flutter: Secondary | ICD-10-CM

## 2023-07-08 DIAGNOSIS — Z9889 Other specified postprocedural states: Secondary | ICD-10-CM

## 2023-07-08 MED ORDER — APIXABAN 5 MG PO TABS: 5.0000 mg | ORAL_TABLET | Freq: Two times a day (BID) | ORAL | 2 refills | Status: DC

## 2023-07-08 MED ORDER — APIXABAN 5 MG PO TABS: 5.0000 mg | ORAL_TABLET | Freq: Two times a day (BID) | ORAL | 0 refills | Status: DC

## 2023-07-08 MED ORDER — AMIODARONE HCL 200 MG PO TABS: ORAL_TABLET | ORAL | 1 refills | Status: DC

## 2023-07-08 NOTE — Patient Instructions (Addendum)
Medication Instructions:   START AMIODARONE Take one (1) tablet by mouth ( 200 mg) twice daily X 1 weeks and than take one (1) table by mouth ( 200 mg ) daily.   Your physician recommends that you continue on your current medications as directed. Please refer to the Current Medication list given to you today.   *If you need a refill on your cardiac medications before your next appointment, please call your pharmacy*   Lab Work:  None ordered.  If you have labs (blood work) drawn today and your tests are completely normal, you will receive your results only by: MyChart Message (if you have MyChart) OR A paper copy in the mail If you have any lab test that is abnormal or we need to change your treatment, we will call you to review the results.   Testing/Procedures:      Dear Amy Williamson  You are scheduled for a Cardioversion on Thursday, December 19 with Dr. Anne Fu.  Please arrive at the [facility] (Main Entrance A) at [facility]: [address] [place], [place] [address] at 12:00 PM (This time is 1 hour(s) before your procedure to ensure your preparation).   Free valet parking service is available. You will check in at ADMITTING.    DIET:  Nothing to eat or drink after midnight except a sip of water with medications (see medication instructions below)  MEDICATION INSTRUCTIONS:        :1}Continue taking your anticoagulant (blood thinner): Apixaban (Eliquis).  You will need to continue this after your procedure until you are told by your provider that it is safe to stop.    LABS: None  FYI:  For your safety, and to allow [place] to monitor your vital signs accurately during the surgery/procedure we request: If you have artificial nails, gel coating, SNS etc, please have those removed prior to your surgery/procedure. Not having the nail coverings /polish removed may result in cancellation or delay of your surgery/procedure.  Your support person will be asked to wait  in the waiting room during your procedure.  It is OK to have someone drop you off and come back when you are ready to be discharged.  You cannot drive after the procedure and will need someone to drive you home.  Bring your insurance cards.  *Special Note: Every effort is made to have your procedure done on time. Occasionally there are emergencies that occur at the hospital that may cause delays. Please be patient if a delay does occur.       Follow-Up: At [facility], you and your health needs are our priority.  As part of our continuing mission to provide you with exceptional heart care, we have created designated Provider Care Teams.  These Care Teams include your primary Cardiologist (physician) and Advanced Practice Providers (APPs -  Physician Assistants and Nurse Practitioners) who all work together to provide you with the care you need, when you need it.  We recommend signing up for the patient portal called "MyChart".  Sign up information is provided on this After Visit Summary.  MyChart is used to connect with patients for Virtual Visits (Telemedicine).  Patients are able to view lab/test results, encounter notes, upcoming appointments, etc.  Non-urgent messages can be sent to your provider as well.   To learn more about what you can do with MyChart, go to [email or web address].    Your next appointment:   1 month(s)  Provider:   Dr. Izora Ribas  Other Instructions  Keep Dr. Lalla Brothers appointment in January.

## 2023-07-09 ENCOUNTER — Telehealth: Payer: Self-pay | Admitting: Cardiology

## 2023-07-09 ENCOUNTER — Other Ambulatory Visit: Payer: Self-pay | Admitting: Cardiology

## 2023-07-09 ENCOUNTER — Ambulatory Visit (INDEPENDENT_AMBULATORY_CARE_PROVIDER_SITE_OTHER): Payer: Medicare Other | Admitting: Internal Medicine

## 2023-07-09 ENCOUNTER — Encounter: Payer: Self-pay | Admitting: Internal Medicine

## 2023-07-09 VITALS — BP 132/82 | HR 87 | Temp 97.9°F | Ht 60.0 in | Wt 204.0 lb

## 2023-07-09 DIAGNOSIS — I10 Essential (primary) hypertension: Secondary | ICD-10-CM

## 2023-07-09 DIAGNOSIS — Z23 Encounter for immunization: Secondary | ICD-10-CM | POA: Diagnosis not present

## 2023-07-09 DIAGNOSIS — I700 Atherosclerosis of aorta: Secondary | ICD-10-CM

## 2023-07-09 DIAGNOSIS — R7303 Prediabetes: Secondary | ICD-10-CM | POA: Diagnosis not present

## 2023-07-09 DIAGNOSIS — I480 Paroxysmal atrial fibrillation: Secondary | ICD-10-CM

## 2023-07-09 DIAGNOSIS — Z0001 Encounter for general adult medical examination with abnormal findings: Secondary | ICD-10-CM

## 2023-07-09 DIAGNOSIS — E559 Vitamin D deficiency, unspecified: Secondary | ICD-10-CM

## 2023-07-09 DIAGNOSIS — E782 Mixed hyperlipidemia: Secondary | ICD-10-CM

## 2023-07-09 DIAGNOSIS — I4892 Unspecified atrial flutter: Secondary | ICD-10-CM

## 2023-07-09 DIAGNOSIS — E538 Deficiency of other specified B group vitamins: Secondary | ICD-10-CM

## 2023-07-09 LAB — LIPID PANEL
Cholesterol: 225 mg/dL — ABNORMAL HIGH (ref 0–200)
HDL: 56.6 mg/dL (ref >39.00)
LDL Cholesterol: 141 mg/dL — ABNORMAL HIGH (ref 0–99)
NonHDL: 168.73
Total CHOL/HDL Ratio: 4
Triglycerides: 137 mg/dL (ref 0.0–149.0)
VLDL: 27.4 mg/dL (ref 0.0–40.0)

## 2023-07-09 LAB — HEMOGLOBIN A1C: Hgb A1c MFr Bld: 6 % (ref 4.6–6.5)

## 2023-07-09 LAB — VITAMIN B12: Vitamin B-12: 1388 pg/mL — ABNORMAL HIGH (ref 211–911)

## 2023-07-09 LAB — VITAMIN D 25 HYDROXY (VIT D DEFICIENCY, FRACTURES): VITD: 28.41 ng/mL — ABNORMAL LOW (ref 30.00–100.00)

## 2023-07-09 MED ORDER — CLOTRIMAZOLE-BETAMETHASONE 1-0.05 % EX CREA
1.0000 | TOPICAL_CREAM | Freq: Every day | CUTANEOUS | 3 refills | Status: DC
Start: 2023-07-09 — End: 2023-10-02

## 2023-07-09 MED ORDER — PANTOPRAZOLE SODIUM 40 MG PO TBEC: 40.0000 mg | DELAYED_RELEASE_TABLET | Freq: Every day | ORAL | 3 refills | Status: DC

## 2023-07-09 NOTE — Telephone Encounter (Signed)
Spoke with Pt, she voiced concern in regards to starting amiodarone tomorrow. She picked the medication up today and read information pamphlet which states medication should be started in hospital for close monitoring.  Informed Pt there are certain medications and different forms of medications that do need to be started in the hospital, but oral amiodarone is OK to start at home tomorrow.  Pt verbalized understanding and expressed appreciation for call.

## 2023-07-09 NOTE — Telephone Encounter (Signed)
Pt c/o medication issue:  1. Name of Medication: amiodarone (PACERONE) 200 MG tablet   2. How are you currently taking this medication (dosage and times per day)? As written  3. Are you having a reaction (difficulty breathing--STAT)? no  4. What is your medication issue? Pt is suppose to start medication in the morning, but she has read about it and has questiosn

## 2023-07-09 NOTE — Progress Notes (Unsigned)
   Subjective:   Patient ID: Amy Williamson, female    DOB: 1951-02-09, 72 y.o.   MRN: [id number]  HPI The patient is here for physical.  PMH, [facility], social history reviewed and updated  Review of Systems  Objective:  Physical Exam  Vitals:   07/09/23 0852  BP: 132/82  Pulse: 87  Temp: 97.9 F (36.6 C)  TempSrc: Oral  SpO2: 94%  Weight: 204 lb (92.5 kg)  Height: 5' (1.524 m)    Assessment & Plan:

## 2023-07-09 NOTE — Patient Instructions (Addendum)
I have sent in a stronger cream to use twice a day on the area.  We will check the labs today.

## 2023-07-11 ENCOUNTER — Encounter (HOSPITAL_COMMUNITY)
Admission: RE | Disposition: A | Discharge status: Home / Self Care | Payer: Self-pay | Source: Home / Self Care | Attending: Cardiology

## 2023-07-11 ENCOUNTER — Encounter (HOSPITAL_COMMUNITY): Payer: Self-pay | Admitting: Cardiology

## 2023-07-11 ENCOUNTER — Encounter: Payer: Self-pay | Admitting: Internal Medicine

## 2023-07-11 ENCOUNTER — Ambulatory Visit (HOSPITAL_COMMUNITY): Payer: Medicare Other | Admitting: Anesthesiology

## 2023-07-11 ENCOUNTER — Other Ambulatory Visit: Payer: Self-pay

## 2023-07-11 ENCOUNTER — Ambulatory Visit (HOSPITAL_COMMUNITY)
Admission: RE | Admit: 2023-07-11 | Discharge: 2023-07-11 | Disposition: A | Discharge status: Home / Self Care | Payer: Medicare Other | Attending: Cardiology | Admitting: Cardiology

## 2023-07-11 DIAGNOSIS — I4891 Unspecified atrial fibrillation: Secondary | ICD-10-CM | POA: Diagnosis not present

## 2023-07-11 DIAGNOSIS — Z95818 Presence of other cardiac implants and grafts: Secondary | ICD-10-CM | POA: Diagnosis not present

## 2023-07-11 DIAGNOSIS — I10 Essential (primary) hypertension: Secondary | ICD-10-CM

## 2023-07-11 DIAGNOSIS — I700 Atherosclerosis of aorta: Secondary | ICD-10-CM | POA: Insufficient documentation

## 2023-07-11 DIAGNOSIS — Z7901 Long term (current) use of anticoagulants: Secondary | ICD-10-CM | POA: Insufficient documentation

## 2023-07-11 DIAGNOSIS — R7303 Prediabetes: Secondary | ICD-10-CM | POA: Insufficient documentation

## 2023-07-11 DIAGNOSIS — I4819 Other persistent atrial fibrillation: Secondary | ICD-10-CM | POA: Diagnosis present

## 2023-07-11 DIAGNOSIS — K219 Gastro-esophageal reflux disease without esophagitis: Secondary | ICD-10-CM | POA: Diagnosis not present

## 2023-07-11 DIAGNOSIS — G4733 Obstructive sleep apnea (adult) (pediatric): Secondary | ICD-10-CM | POA: Insufficient documentation

## 2023-07-11 DIAGNOSIS — E785 Hyperlipidemia, unspecified: Secondary | ICD-10-CM

## 2023-07-11 DIAGNOSIS — I4892 Unspecified atrial flutter: Secondary | ICD-10-CM | POA: Diagnosis not present

## 2023-07-11 HISTORY — PX: CARDIOVERSION: EP1203

## 2023-07-11 SURGERY — CARDIOVERSION (CATH LAB)
Anesthesia: General

## 2023-07-11 MED ORDER — PROPOFOL 10 MG/ML IV BOLUS
INTRAVENOUS | Status: DC | PRN
  Administered 2023-07-11: 50 mg via INTRAVENOUS

## 2023-07-11 MED ORDER — LIDOCAINE 2% (20 MG/ML) 5 ML SYRINGE
INTRAMUSCULAR | Status: DC | PRN
  Administered 2023-07-11: 60 mg via INTRAVENOUS

## 2023-07-11 SURGICAL SUPPLY — 1 items: PAD DEFIB RADIO PHYSIO CONN (PAD) ×1 IMPLANT

## 2023-07-11 NOTE — Assessment & Plan Note (Addendum)
Stopped crestor due to myalgia and does not want to resume. Checking lipid panel and try another agent if needed.

## 2023-07-11 NOTE — Assessment & Plan Note (Signed)
BMI 39 but complicated by hypertension and hyperlipidemia, counseled about diet and exercise.

## 2023-07-11 NOTE — Anesthesia Preprocedure Evaluation (Addendum)
Anesthesia Evaluation  Patient identified by MRN, date of birth, ID band Patient awake    Reviewed: Allergy & Precautions, H&P , NPO status , Patient's Chart, lab work & pertinent test results  History of Anesthesia Complications (+) PONV and history of anesthetic complications  Airway Mallampati: II  TM Distance: >3 FB Neck ROM: Full    Dental no notable dental hx. (+) Teeth Intact, Dental Advisory Given   Pulmonary sleep apnea    Pulmonary exam normal breath sounds clear to auscultation       Cardiovascular hypertension, Pt. on medications + dysrhythmias Atrial Fibrillation  Rhythm:Irregular Rate:Tachycardia     Neuro/Psych  Headaches   Depression       GI/Hepatic Neg liver ROS,GERD  Medicated,,  Endo/Other    Class 3 obesity  Renal/GU negative Renal ROS  negative genitourinary   Musculoskeletal  (+) Arthritis , Osteoarthritis,    Abdominal   Peds  Hematology negative hematology ROS (+)   Anesthesia Other Findings   Reproductive/Obstetrics negative OB ROS                             Anesthesia Physical Anesthesia Plan  ASA: 3  Anesthesia Plan: General   Post-op Pain Management: Minimal or no pain anticipated   Induction: Intravenous  PONV Risk Score and Plan: 4 or greater and Propofol infusion and Treatment may vary due to age or medical condition  Airway Management Planned: Natural Airway and Simple Face Mask  Additional Equipment:   Intra-op Plan:   Post-operative Plan:   Informed Consent: I have reviewed the patients History and Physical, chart, labs and discussed the procedure including the risks, benefits and alternatives for the proposed anesthesia with the patient or authorized representative who has indicated his/her understanding and acceptance.     Dental advisory given  Plan Discussed with: CRNA  Anesthesia Plan Comments:        Anesthesia Quick  Evaluation

## 2023-07-11 NOTE — Assessment & Plan Note (Signed)
BP at goal recent labs stable continue amiodarone, diltiazem.

## 2023-07-11 NOTE — Assessment & Plan Note (Addendum)
Flu shot given. Pneumonia complete. Shingrix complete. Tetanus up to date. Colonoscopy u p to date. Mammogram up to date, pap smear aged out and dexa complete. Counseled about sun safety and mole surveillance. Counseled about the dangers of distracted driving. Given 10 year screening recommendations.

## 2023-07-11 NOTE — Assessment & Plan Note (Signed)
Taking amiodarone and eliquis and has watchman in place and taking diltiazem and flecainide. She is getting cardioversion soon and ablation likely in near future.

## 2023-07-11 NOTE — Interval H&P Note (Signed)
History and Physical Interval Note:  07/11/2023 11:50 AM  Amy Williamson  has presented today for surgery, with the diagnosis of afib.  The various methods of treatment have been discussed with the patient and family. After consideration of risks, benefits and other options for treatment, the patient has consented to  Procedure(s): CARDIOVERSION (N/A) as a surgical intervention.  The patient's history has been reviewed, patient examined, no change in status, stable for surgery.  I have reviewed the patient's chart and labs.  Questions were answered to the patient's satisfaction.     [employer]

## 2023-07-11 NOTE — CV Procedure (Signed)
    Electrical Cardioversion Procedure Note Amy Williamson [id number] 06/28/1951  Procedure: Electrical Cardioversion Indications:  Atrial Fibrillation  Time Out: Verified patient identification, verified procedure,medications/allergies/relevent history reviewed, required imaging and test results available.  Performed  Procedure Details  The patient was NPO after midnight. Anesthesia was administered at the beside  by Dr.Fitzgerald with propofol.  Cardioversion was performed with synchronized biphasic defibrillation via AP pads with 200 joules.  1 attempt(s) were performed.  The patient converted to normal sinus rhythm. The patient tolerated the procedure well   IMPRESSION:  Successful cardioversion of atrial fibrillation    Donato Schultz 07/11/2023, 12:14 PM

## 2023-07-11 NOTE — Transfer of Care (Signed)
Immediate Anesthesia Transfer of Care Note  Patient: Amy Williamson  Procedure(s) Performed: CARDIOVERSION  Patient Location: PACU and Cath Lab  Anesthesia Type:General  Level of Consciousness: awake and drowsy  Airway & Oxygen Therapy: Patient Spontanous Breathing and Patient connected to nasal cannula oxygen  Post-op Assessment: Report given to RN and Post -op Vital signs reviewed and stable  Post vital signs: Reviewed and stable  Last Vitals:  Vitals Value Taken Time  BP 146/91 07/11/23 1200  Temp    Pulse 112 07/11/23 1159  Resp 27 07/11/23 1159  SpO2 98 % 07/11/23 1159  Vitals shown include unfiled device data.  Last Pain:  Vitals:   07/11/23 1118  TempSrc:   PainSc: 0-No pain         Complications: No notable events documented.

## 2023-07-11 NOTE — Anesthesia Postprocedure Evaluation (Signed)
Anesthesia Post Note  Patient: Amy Williamson  Procedure(s) Performed: CARDIOVERSION     Patient location during evaluation: Cath Lab Anesthesia Type: General Level of consciousness: awake and alert Pain management: pain level controlled Vital Signs Assessment: post-procedure vital signs reviewed and stable Respiratory status: spontaneous breathing, nonlabored ventilation and respiratory function stable Cardiovascular status: blood pressure returned to baseline and stable Postop Assessment: no apparent nausea or vomiting Anesthetic complications: no  No notable events documented.  Last Vitals:  Vitals:   07/11/23 1225 07/11/23 1230  BP: 138/77 99/71  Pulse: 84 81  Resp: 13 15  Temp:    SpO2: 99% 100%    Last Pain:  Vitals:   07/11/23 1217  TempSrc: Temporal  PainSc: 0-No pain                 Berdina Cheever,W. EDMOND

## 2023-07-11 NOTE — Assessment & Plan Note (Signed)
Checking HgA1C and adjust as needed.

## 2023-07-11 NOTE — Assessment & Plan Note (Signed)
Taking eliquis for anticoagulation and no anginal pains.

## 2023-07-12 ENCOUNTER — Encounter (HOSPITAL_COMMUNITY): Payer: Self-pay | Admitting: Cardiology

## 2023-08-07 ENCOUNTER — Ambulatory Visit: Payer: PPO | Attending: Internal Medicine | Admitting: Internal Medicine

## 2023-08-07 ENCOUNTER — Encounter: Payer: Self-pay | Admitting: Internal Medicine

## 2023-08-07 VITALS — BP 140/68 | HR 64 | Ht 60.0 in | Wt 205.6 lb

## 2023-08-07 DIAGNOSIS — I272 Pulmonary hypertension, unspecified: Secondary | ICD-10-CM

## 2023-08-07 DIAGNOSIS — I4891 Unspecified atrial fibrillation: Secondary | ICD-10-CM | POA: Diagnosis not present

## 2023-08-07 DIAGNOSIS — I4892 Unspecified atrial flutter: Secondary | ICD-10-CM

## 2023-08-07 DIAGNOSIS — E782 Mixed hyperlipidemia: Secondary | ICD-10-CM

## 2023-08-07 DIAGNOSIS — I10 Essential (primary) hypertension: Secondary | ICD-10-CM | POA: Diagnosis not present

## 2023-08-07 DIAGNOSIS — I480 Paroxysmal atrial fibrillation: Secondary | ICD-10-CM

## 2023-08-07 DIAGNOSIS — R600 Localized edema: Secondary | ICD-10-CM

## 2023-08-07 DIAGNOSIS — Z95818 Presence of other cardiac implants and grafts: Secondary | ICD-10-CM

## 2023-08-07 DIAGNOSIS — I2510 Atherosclerotic heart disease of native coronary artery without angina pectoris: Secondary | ICD-10-CM

## 2023-08-07 MED ORDER — FUROSEMIDE 20 MG PO TABS: 20.0000 mg | ORAL_TABLET | Freq: Every day | ORAL | 11 refills | Status: AC | PRN

## 2023-08-07 MED ORDER — EZETIMIBE 10 MG PO TABS: 10.0000 mg | ORAL_TABLET | Freq: Every day | ORAL | 3 refills | Status: DC

## 2023-08-07 NOTE — Progress Notes (Signed)
 Cardiology Office Note:    Date:  08/07/2023   ID:  Amy Williamson, DOB 08-Dec-1950, MRN [id number]  PCP:  Amy Homestead, MD  [facility] Cardiologist:  Amy Larger MD [facility] Electrophysiologist:  Amy Byes, MD   CC: Atrial Fibrillation Follow up  History of Present Illness:    Amy Williamson is a 73 y.o. female with a hx of new atrial fibrillation, with HTN, Aortic Atherosclerosis and HLD, Morbid Obesity, history of dysphagia who sees (Amy Williamson); prior lumpectomy in 06/09/2020 who presented for evaluation 07/08/20. 2022: Had DCCV.  Saw Amy Williamson for OSA. 2023: Had multiple DCCVs and started flecainide  (if needed).  AF ablation scheduled for 9/23.  Had Watchman.  Had R groin bleed but otherwise did well.  Had back surgery 2024: S/p Watchman.  New AFL.  Amiodarone  started 12/24  Miss Amy Williamson, a patient with a history of atrial fibrillation status post ablation and Watchman placement in 2023, hypertension, hyperlipidemia, atherosclerosis, and obstructive sleep apnea, presents for a follow-up consultation. Since the ablation in 2024, the patient has experienced atrial flutter and was placed on oral amiodarone  (1C agent stopped). The patient is had 07/29/23 cardioversion to alleviate symptoms and as a bridge to potential atrial flutter ablation.  The patient reports having undergone another back surgery since the last consultation and is currently in the healing process. There have been no complications such as hematomas reported post-surgery. The patient also reports concerns about elevated cholesterol levels, having stopped taking rosuvastatin  due to joint pain.  The patient has been experiencing occasional palpitations despite being in normal rhythm at the time of the consultation. The patient is currently on blood thinners and amiodarone , with the hope that the blood thinners will not be a long-term requirement. The patient also reports occasional  swelling in the feet and elevated blood pressure, which sometimes reduces with rest.  The patient has a history of sleep apnea and is managing it with a mouth guard. The patient also has aortic atherosclerosis with plaque buildup in the arteries. The patient is not currently on flutamide. The patient is scheduled to see another doctor for potential ablation consideration later in the month.   Past Medical History:  Diagnosis Date   Anticoagulant long-term use    eliquis  --- managed by cardiology   Arthritis    BACK AND SHOULDERS   Depressive disorder, not elsewhere classified    Essential hypertension    followed by pcp and cardiology   Frequency of urination    GERD (gastroesophageal reflux disease)    Headache    migraines when younger   History of dysphagia 2015   s/p egd w/ dilation, pt did not have stricture   History of gastritis    OSA (obstructive sleep apnea)    followed by dr t. turner---  study in epic 09-03-2020 moderate osa ,  pt scheduled for cpap titration 12-08-2020- pt wears oral device, cannot wear CPAP   PAF (paroxysmal atrial fibrillation) [facility])    cardiologist-- dr Amy Williamson. Amy Williamson---- new onset 06-30-2020 at pcp office visit,  s/p DCCV 07-28-2020 successful   Palpitations 06/2020   11-16-2020  per pt denies palpitations since DCCV 07-28-2020   Plantar fasciitis 08/2017   left foot    Pneumonia    PONV (postoperative nausea and vomiting)    nausea after back surgery, no issues after ablation   Presence of Watchman left atrial appendage closure device 06/28/2022   Watchman FLX 27mm with Dr. Marven Williamson  Pure hypercholesterolemia     Past Surgical History:  Procedure Laterality Date   ATRIAL FIBRILLATION ABLATION N/A 03/30/2022   Procedure: ATRIAL FIBRILLATION ABLATION;  Surgeon: Amy Byes, MD;  [facility];  Service: Cardiovascular;  Laterality: N/A;   BREAST LUMPECTOMY WITH RADIOACTIVE SEED LOCALIZATION Left 06/09/2020   Procedure:  LEFT BREAST LUMPECTOMY WITH RADIOACTIVE SEED LOCALIZATION;  Surgeon: Amy Blumenthal, MD;  Location: [facility];  Service: General;  Laterality: Left;  LMA   CARDIOVERSION N/A 07/28/2020   Procedure: CARDIOVERSION;  Surgeon: Amy Melody, MD;  Location: [facility];  Service: Cardiovascular;  Laterality: N/A;   CARDIOVERSION N/A 07/11/2023   Procedure: CARDIOVERSION;  Surgeon: Amy Madura, MD;  Location: [facility];  Service: Cardiovascular;  Laterality: N/A;   CARPAL TUNNEL RELEASE Bilateral right 2004;  left 2006   CATARACT EXTRACTION W/ INTRAOCULAR LENS  IMPLANT, BILATERAL  2016   CESAREAN SECTION  [id number]   BILATERAL TUBAL LIGATION W/ LAST C/S   COLONOSCOPY WITH PROPOFOL   last one 08/ 2013   DILATATION & CURETTAGE/HYSTEROSCOPY WITH MYOSURE N/A 11/22/2020   Procedure: DILATATION & CURETTAGE/HYSTEROSCOPY WITH MYOSURE;  Surgeon: Amy Rein, MD;  Location: [facility];  Service: Gynecology;  Laterality: N/A;   EAR CYST EXCISION  08/03/2011   patient states this is incorrect was a Baker's cyst popliteal area   ESOPHAGOGASTRODUODENOSCOPY (EGD) WITH ESOPHAGEAL DILATION  08/2013   KNEE ARTHROSCOPY W/ MENISCAL REPAIR Left 08-03-2011  @[facility]    and excision baker's cyst   LEFT ATRIAL APPENDAGE OCCLUSION N/A 06/28/2022   Procedure: LEFT ATRIAL APPENDAGE OCCLUSION;  Surgeon: Amy Byes, MD;  Location: [facility];  Service: Cardiovascular;  Laterality: N/A;   LUMBAR FUSION  02/19/2022   posterior- lumbar 5-6   PULLEY RELEASE RIGHT TRIGGER FINGER Right 01/08/2011   TEE WITHOUT CARDIOVERSION N/A 06/28/2022   Procedure: TRANSESOPHAGEAL ECHOCARDIOGRAM (TEE);  Surgeon: Amy Byes, MD;  Location: [facility];  Service: Cardiovascular;  Laterality: N/A;   TRIGGER FINGER RELEASE Bilateral left 05/06/14;  right  02-29-2014    Current Medications: Current Meds  Medication Sig   acetaminophen  (TYLENOL ) 650 MG CR  tablet Take 1,300 mg by mouth in the morning and at bedtime.   amiodarone  (PACERONE ) 200 MG tablet Take one (1) tablet by mouth ( 200 mg) twice daily X 1 week than take One (1) tablet by mouth ( 200 mg) daily.   apixaban  (ELIQUIS ) 5 MG TABS tablet Take 1 tablet (5 mg total) by mouth 2 (two) times daily.   bisacodyl  (DULCOLAX) 5 MG EC tablet Take 5 mg by mouth daily as needed for moderate constipation.   Cholecalciferol (VITAMIN D3 PO) Take 1 tablet by mouth daily.   clotrimazole -betamethasone  (LOTRISONE ) cream Apply 1 Application topically daily.   Cyanocobalamin  (B-12 PO) Take 1 tablet by mouth daily.   diltiazem  (CARDIZEM  CD) 240 MG 24 hr capsule Take 240 mg by mouth daily.   ezetimibe  (ZETIA ) 10 MG tablet Take 1 tablet (10 mg total) by mouth daily.   furosemide  (LASIX ) 20 MG tablet Take 1 tablet (20 mg total) by mouth daily as needed.   pantoprazole  (PROTONIX ) 40 MG tablet Take 1 tablet (40 mg total) by mouth daily.   [DISCONTINUED] flecainide  (TAMBOCOR ) 50 MG tablet Take 0.5 tablets (25 mg total) by mouth 2 (two) times daily as needed (breakthrough afib). (Patient taking differently: Take 50 mg by mouth 2 (two) times daily as needed (breakthrough afib).)  Allergies:   Tramadol , Atenolol , Avelox [moxifloxacin hcl in nacl], Azithromycin , Doxycycline , Myrbetriq  [mirabegron ], Naproxen, and Oxybutynin    Social History   Socioeconomic History   Marital status: Married    Spouse name: Josefina Nian   Number of children: 2   Years of education: Not on file   Highest education level: Not on file  Occupational History   Occupation: retired  Tobacco Use   Smoking status: Never   Smokeless tobacco: Never   Tobacco comments:    Never smoke 10/11/21  Vaping Use   Vaping status: Never Used  Substance and Sexual Activity   Alcohol use: No   Drug use: Never   Sexual activity: Not Currently    Birth control/protection: Surgical, Post-menopausal    Comment: BTL  Other Topics Concern   Not on file   Social History Narrative   Not on file   Social Drivers of Health   Financial Resource Strain: Low Risk  (06/26/2023)   Overall Financial Resource Strain (CARDIA)    Difficulty of Paying Living Expenses: Not hard at all  Food Insecurity: No Food Insecurity (06/26/2023)   Hunger Vital Sign    Worried About Running Out of Food in the Last Year: Never true    Ran Out of Food in the Last Year: Never true  Transportation Needs: No Transportation Needs (06/26/2023)   PRAPARE - Administrator, Civil Service (Medical): No    Lack of Transportation (Non-Medical): No  Physical Activity: Insufficiently Active (06/26/2023)   Exercise Vital Sign    Days of Exercise per Week: 2 days    Minutes of Exercise per Session: 30 min  Stress: No Stress Concern Present (06/26/2023)   [employer] of Occupational Health - Occupational Stress Questionnaire    Feeling of Stress : Not at all  Social Connections: Socially Integrated (06/26/2023)   Social Connection and Isolation Panel [NHANES]    Frequency of Communication with Friends and Family: Three times a week    Frequency of Social Gatherings with Friends and Family: Three times a week    Attends Religious Services: More than 4 times per year    Active Member of Clubs or Organizations: Yes    Attends Engineer, structural: More than 4 times per year    Marital Status: Married    Social:  I take care of her nephew, her husband comes to some visits.  Family History: The patient's family history includes Clotting disorder in her father; Heart disease in her father; Heart failure in her mother; Hyperlipidemia in her mother; Hypertension in her mother; Liver cancer in her brother; Liver disease in her maternal aunt; Osteoporosis in her mother. There is no history of Colon cancer. History of coronary artery disease notable for no members. History of heart failure notable for mother. No history of cardiomyopathies including hypertrophic  cardiomyopathy, left ventricular non-compaction, or arrhythmogenic right ventricular cardiomyopathy. History of arrhythmia notable for AF in mother. Denies family history of sudden cardiac death including drowning, car accidents, or unexplained deaths in the family. No history of bicuspid aortic valve or aortic aneurysm or dissection.  ROS:   Please see the history of present illness.     EKGs/Labs/Other Studies Reviewed:    The following studies were reviewed today:  EKG:  02/09/22: SR 1st HB LVH 09/13/2020: SR rate 61 WNL 06/30/20:  A fib 70   Cardiac Studies & Procedures     STRESS TESTS  MYOCARDIAL PERFUSION IMAGING 10/19/2021  Narrative   The  study is normal. The study is low risk.   No ST deviation was noted.   LV perfusion is normal.   Left ventricular function is normal. Nuclear stress EF: 69 %. The left ventricular ejection fraction is hyperdynamic (>65%). End diastolic cavity size is normal.   Prior study not available for comparison.  ECHOCARDIOGRAM  ECHOCARDIOGRAM COMPLETE 01/10/2022  Narrative ECHOCARDIOGRAM REPORT    Patient Name:   Amy Williamson Date of Exam: 01/10/2022 Medical Rec #:  [id number]        Height:       60.0 in Accession #:    [id number]       Weight:       216.0 lb Date of Birth:  12-23-1950         BSA:          1.929 m Patient Age:    71 years         BP:           140/74 mmHg Patient Gender: F                HR:           70 bpm. Exam Location:  [address]  Procedure: 2D Echo, Cardiac Doppler and Color Doppler  Indications:    I48.19 Atrial fibrillation  History:        Patient has prior history of Echocardiogram examinations, most recent 08/10/2020. Arrythmias:Atrial Fibrillation; Risk Factors:Morbid obesity and Hypertension.  Sonographer:    Lula Sale RDCS Referring Phys: [id number] Leanora Prophet LAMBERT  IMPRESSIONS   1. Left ventricular ejection fraction, by estimation, is 60 to 65%. The left ventricle has normal function.  The left ventricle has no regional wall motion abnormalities. Left ventricular diastolic parameters are indeterminate. 2. Right ventricular systolic function is normal. The right ventricular size is normal. There is normal pulmonary artery systolic pressure. 3. Left atrial size was mildly dilated. 4. The mitral valve is normal in structure. Mild mitral valve regurgitation. No evidence of mitral stenosis. 5. The aortic valve is grossly normal. Aortic valve regurgitation is not visualized. No aortic stenosis is present. 6. The inferior vena cava is normal in size with greater than 50% respiratory variability, suggesting right atrial pressure of 3 mmHg.  Comparison(s): No significant change from prior study.  Conclusion(s)/Recommendation(s): Otherwise normal echocardiogram, with minor abnormalities described in the report.  FINDINGS Left Ventricle: Left ventricular ejection fraction, by estimation, is 60 to 65%. The left ventricle has normal function. The left ventricle has no regional wall motion abnormalities. The left ventricular internal cavity size was normal in size. There is no left ventricular hypertrophy. Left ventricular diastolic parameters are indeterminate.  Right Ventricle: The right ventricular size is normal. No increase in right ventricular wall thickness. Right ventricular systolic function is normal. There is normal pulmonary artery systolic pressure. The tricuspid regurgitant velocity is 2.61 m/s, and with an assumed right atrial pressure of 3 mmHg, the estimated right ventricular systolic pressure is 30.2 mmHg.  Left Atrium: Left atrial size was mildly dilated.  Right Atrium: Right atrial size was normal in size.  Pericardium: Trivial pericardial effusion is present.  Mitral Valve: The mitral valve is normal in structure. There is mild thickening of the mitral valve leaflet(s). There is mild calcification of the mitral valve leaflet(s). Mild mitral valve regurgitation, with  eccentric laterally directed jet. No evidence of mitral valve stenosis.  Tricuspid Valve: The tricuspid valve is normal in structure. Tricuspid valve regurgitation is mild .  No evidence of tricuspid stenosis.  Aortic Valve: The aortic valve is grossly normal. Aortic valve regurgitation is not visualized. No aortic stenosis is present.  Pulmonic Valve: The pulmonic valve was not well visualized. Pulmonic valve regurgitation is trivial. No evidence of pulmonic stenosis.  Aorta: The aortic root, ascending aorta, aortic arch and descending aorta are all structurally normal, with no evidence of dilitation or obstruction.  Venous: The inferior vena cava is normal in size with greater than 50% respiratory variability, suggesting right atrial pressure of 3 mmHg.  IAS/Shunts: The atrial septum is grossly normal.   LEFT VENTRICLE PLAX 2D LVIDd:         4.70 cm   Diastology LVIDs:         3.00 cm   LV e' medial:    11.70 cm/s LV PW:         1.10 cm   LV E/e' medial:  12.6 LV IVS:        0.90 cm   LV e' lateral:   5.55 cm/s LVOT diam:     1.90 cm   LV E/e' lateral: 26.7 LV SV:         75 LV SV Index:   39 LVOT Area:     2.84 cm   RIGHT VENTRICLE             IVC RV S prime:     14.80 cm/s  IVC diam: 1.70 cm TAPSE (M-mode): 2.9 cm RVSP:           30.2 mmHg  LEFT ATRIUM             Index        RIGHT ATRIUM           Index LA diam:        4.10 cm 2.13 cm/m   RA Pressure: 3.00 mmHg LA Vol (A2C):   51.8 ml 26.86 ml/m  RA Area:     11.20 cm LA Vol (A4C):   63.8 ml 33.08 ml/m  RA Volume:   20.50 ml  10.63 ml/m LA Biplane Vol: 59.2 ml 30.69 ml/m AORTIC VALVE LVOT Vmax:   111.00 cm/s LVOT Vmean:  73.700 cm/s LVOT VTI:    0.265 m  AORTA Ao Root diam: 3.10 cm Ao Asc diam:  3.40 cm  MITRAL VALVE                TRICUSPID VALVE MV Area (PHT): 3.23 cm     TR Peak grad:   27.2 mmHg MV Decel Time: 235 msec     TR Vmax:        261.00 cm/s MV E velocity: 148.00 cm/s  Estimated RAP:  3.00  mmHg MV A velocity: 136.00 cm/s  RVSP:           30.2 mmHg MV E/A ratio:  1.09 SHUNTS Systemic VTI:  0.26 m Systemic Diam: 1.90 cm  Sheryle Donning MD Electronically signed by Sheryle Donning MD Signature Date/Time: 01/10/2022/2:39:07 PM    Final  TEE  ECHO TEE 06/28/2022  Narrative TRANSESOPHOGEAL ECHO REPORT    Patient Name:   Amy Williamson Date of Exam: 06/28/2022 Medical Rec #:  [id number]        Height:       60.0 in Accession #:    [id number]       Weight:       200.0 lb Date of Birth:  21-Dec-1950         BSA:  1.867 m Patient Age:    71 years         BP:           136/51 mmHg Patient Gender: F                HR:           76 bpm. Exam Location:  Inpatient  Procedure: Transesophageal Echo, Cardiac Doppler, Color Doppler and 3D Echo  Indications:     Persistent atrial fibrillation (HCC) [Z61.09 (ICD-10-CM)]  History:         Patient has prior history of Echocardiogram examinations, most recent 01/10/2022. Risk Factors:Hypertension, Sleep Apnea and Dyslipidemia. 27mm Watchman implanted on 06/28/2022. GERD.  Sonographer:     Konnie Perry RDCS Referring Phys:  [id number] Amy [facility] Diagnosing Phys: Amy Larger MD  PROCEDURE: After discussion of the risks and benefits of a TEE, an informed consent was obtained from the patient. The patient was intubated. TEE procedure time was 39 minutes. The transesophogeal probe was passed without difficulty through the esophogus of the patient. Imaged were obtained with the patient in a supine position. Sedation performed by different physician. The patient was monitored while under deep sedation. Anesthestetic sedation was provided intravenously by Anesthesiology: 130mg  of Propofol , 80mg  of Lidocaine . Supplementary images were obtained from transthoracic windows as indicated to answer the clinical question. The patient developed no complications during the procedure.  IMPRESSIONS   1.  Interventional TEE for Watchman FLX procedure. 2. Prior to procedure, there was a patent left atrial appendage with a windsock morphology. Maximal diameter 20 cm (ostial deployment) with suitable depth for device. 3. Mid- mid stick with no complications. 4. Placement of 27 mm Watchman FLX device. No leak. No Mitral shoulder. Average compression ~ 23%. 5. Left ventricular ejection fraction, by estimation, is 60 to 65%. The left ventricle has normal function. 6. Right ventricular systolic function is normal. The right ventricular size is normal. 7. Left atrial size was mildly dilated. No left atrial/left atrial appendage thrombus was detected. 8. The mitral valve is normal in structure. Mild mitral valve regurgitation. 9. The aortic valve is normal in structure. Aortic valve regurgitation is not visualized. No aortic stenosis is present. 10. Evidence of atrial level shunting detected by color flow Doppler after transeptal puncture, with all left to right flow. 11. Trivial apical pericardial effusion is unchanged from prior.  FINDINGS Left Ventricle: Left ventricular ejection fraction, by estimation, is 60 to 65%. The left ventricle has normal function. The left ventricular internal cavity size was normal in size.  Right Ventricle: The right ventricular size is normal. No increase in right ventricular wall thickness. Right ventricular systolic function is normal.  Left Atrium: Left atrial size was mildly dilated. No left atrial/left atrial appendage thrombus was detected.  Right Atrium: Right atrial size was normal in size.  Pericardium: Trivial pericardial effusion is present. The pericardial effusion is circumferential and surrounding the apex.  Mitral Valve: The mitral valve is normal in structure. Mild mitral valve regurgitation.  Tricuspid Valve: The tricuspid valve is normal in structure. Tricuspid valve regurgitation is mild . No evidence of tricuspid stenosis.  Aortic Valve: The  aortic valve is normal in structure. Aortic valve regurgitation is not visualized. No aortic stenosis is present.  Pulmonic Valve: The pulmonic valve was normal in structure. Pulmonic valve regurgitation is trivial. No evidence of pulmonic stenosis.  Aorta: The aortic root is normal in size and structure.  IAS/Shunts: Evidence of atrial level shunting detected  by color flow Doppler.   TRICUSPID VALVE TR Peak grad:   24.2 mmHg TR Vmax:        246.00 cm/s  Amy Larger MD Electronically signed by Amy Larger MD Signature Date/Time: 06/28/2022/11:03:59 AM    Final             Recent Labs: 07/04/2023: ALT 15; BUN 17; Creatinine, Ser 0.82; Hemoglobin 13.0; Magnesium 2.5; Platelets 302; Potassium 4.6; Sodium 143  Recent Lipid Panel    Component Value Date/Time   CHOL 225 (H) 07/09/2023 0920   CHOL 155 05/09/2021 0732   TRIG 137.0 07/09/2023 0920   TRIG 54 06/25/2006 0725   HDL 56.60 07/09/2023 0920   HDL 62 05/09/2021 0732   CHOLHDL 4 07/09/2023 0920   VLDL 27.4 07/09/2023 0920   LDLCALC 141 (H) 07/09/2023 0920   LDLCALC 72 05/09/2021 0732   LDLDIRECT 142.3 03/16/2013 0732      Physical Exam:    VS:  BP (!) 140/68 (BP Location: Right Arm)   Pulse 64   Ht 5' (1.524 m)   Wt 205 lb 9.6 oz (93.3 kg)   LMP 07/23/2004   SpO2 96%   BMI 40.15 kg/m     Wt Readings from Last 3 Encounters:  08/07/23 205 lb 9.6 oz (93.3 kg)  07/11/23 204 lb (92.5 kg)  07/09/23 204 lb (92.5 kg)    Gen: no distress, morbid obesity   Neck: No JVD Cardiac: No Rubs or Gallops, no murmur, RRR +2 radial pulses Respiratory: Clear to auscultation bilaterally, normal effort, normal  respiratory rate GI: Soft, nontender, non-distended  MS: Non pitting edema (new) ;  moves all extremities Integument: No hematoma Neuro:  At time of evaluation, alert and oriented to person/place/time/situation  Psych: Normal affect, patient feels OK   ASSESSMENT:    1. Atrial flutter,  unspecified type (HCC)   2. Atrial fibrillation, unspecified type (HCC)   3. Essential hypertension   4. Paroxysmal atrial fibrillation (HCC)   5. Mixed hyperlipidemia   6. Presence of Watchman left atrial appendage closure device   7. Bilateral lower extremity edema   8. Pulmonary hypertension, unspecified (HCC)   9. Coronary artery calcification      PLAN:    Atrial Fibrillation and Atrial Flutter Status post ablation in 2024 and Watchman device in 2023. Currently experiencing paroxysmal atrial fibrillation and atypical atrial flutter. Recent EKG shows no atrial fibrillation but first-degree heart block. On amiodarone  and Eliquis . Discussed potential cardioversion as a bridge to atrial flutter ablation. Risks of amiodarone  include long-term issues. Shared decision-making regarding follow-up with Dr. Marven Williamson for potential ablation. Alternatives include Tikosyn or dofetilide, requiring hospitalization for initiation. - Continue amiodarone  - Continue Eliquis  short tuerm (she is s/p Watchman with good closure, reviewed CT with her) - Remove flecainide  from medication list - has EP f/u to discuss repeat ablation  Hypertension Pulmonary hypertension NOS by CT Elevated blood pressure with occasional lower extremity edema. Discussed diuretics for fluid retention and blood pressure management. - Prescribe Lasix  20 mg PRN - Monitor blood pressure and fluid retention - Schedule follow-up labs if Lasix  is needed daily or every other day - if worsening repeat echocardiogram  Hyperlipidemia Aortic Atherosclerosis CAC Elevated LDL at 141 mg/dL. Reports statin myopathy with rosuvastatin  causing joint pain. Discussed alternative non-statin medications and holistic approaches including diet and exercise (potential exercise clearance today) - Prescribe Zetia  10 mg - Check cholesterol levels in 3 months - Consider newer medications if cholesterol remains elevated - Encourage  dietary modifications  and increased physical activity   Obstructive Sleep Apnea Using a mouth guard. Discussed potential pulmonary hypertension related to sleep apnea. - Continue use of mouth guard   General Health Maintenance Discussed exercise as a preventive measure for atrial fibrillation and overall health. Recovering from back surgery and may start physical therapy.   Follow-up - Follow-up with Dr. Marven [facility] on August 23, 2023 - Schedule follow-up labs in spring - Follow-up with clinical cardiology team in summer.    Medication Adjustments/Labs and Tests Ordered: Current medicines are reviewed at length with the patient today.  Concerns regarding medicines are outlined above.  Orders Placed This Encounter  Procedures   Lipid panel   EKG 12-Lead   Meds ordered this encounter  Medications   ezetimibe  (ZETIA ) 10 MG tablet    Sig: Take 1 tablet (10 mg total) by mouth daily.    Dispense:  90 tablet    Refill:  3   furosemide  (LASIX ) 20 MG tablet    Sig: Take 1 tablet (20 mg total) by mouth daily as needed.    Dispense:  30 tablet    Refill:  11    Patient Instructions  Medication Instructions:  Your physician has recommended you make the following change in your medication:  START: ezetimibe  (Zetia ) 10 mg by mouth once daily  START: furosemide  (Lasix ) 20 mg by mouth once daily as needed for leg swelling.  If you are taking med more than twice weekly please notify our office.   *If you need a refill on your cardiac medications before your next appointment, please call your pharmacy*   Lab Work: IN 3 MONTHS: Fasting lipid panel (nothing to eat or drink 12 hours prior except water )  If you have labs (blood work) drawn today and your tests are completely normal, you will receive your results only by: MyChart Message (if you have MyChart) OR A paper copy in the mail If you have any lab test that is abnormal or we need to change your treatment, we will call you to review the  results.   Testing/Procedures: NONE   Follow-Up: At [facility], you and your health needs are our priority.  As part of our continuing mission to provide you with exceptional heart care, we have created designated Provider Care Teams.  These Care Teams include your primary Cardiologist (physician) and Advanced Practice Providers (APPs -  Physician Assistants and Nurse Practitioners) who all work together to provide you with the care you need, when you need it.   Your next appointment:   5-6 month(s)  Provider:   Gloriann Larger, MD or Lovette Rud, PA-C, Charles Connor, NP, Slater Duncan, NP, Marlyse Single, PA-C, or Leala Prince, PA-C             Signed, Amy Melody, MD  08/07/2023 10:05 AM    [facility]

## 2023-08-07 NOTE — Patient Instructions (Addendum)
 Medication Instructions:  Your physician has recommended you make the following change in your medication:  START: ezetimibe  (Zetia ) 10 mg by mouth once daily  START: furosemide  (Lasix ) 20 mg by mouth once daily as needed for leg swelling.  If you are taking med more than twice weekly please notify our office.   *If you need a refill on your cardiac medications before your next appointment, please call your pharmacy*   Lab Work: IN 3 MONTHS: Fasting lipid panel (nothing to eat or drink 12 hours prior except water )  If you have labs (blood work) drawn today and your tests are completely normal, you will receive your results only by: MyChart Message (if you have MyChart) OR A paper copy in the mail If you have any lab test that is abnormal or we need to change your treatment, we will call you to review the results.   Testing/Procedures: NONE   Follow-Up: At [facility], you and your health needs are our priority.  As part of our continuing mission to provide you with exceptional heart care, we have created designated Provider Care Teams.  These Care Teams include your primary Cardiologist (physician) and Advanced Practice Providers (APPs -  Physician Assistants and Nurse Practitioners) who all work together to provide you with the care you need, when you need it.   Your next appointment:   5-6 month(s)  Provider:   Gloriann Larger, MD or Lovette Rud, PA-C, Charles Connor, NP, Slater Duncan, NP, Marlyse Single, PA-C, or Leala Prince, PA-C

## 2023-08-23 ENCOUNTER — Ambulatory Visit: Payer: PPO | Attending: Cardiology | Admitting: Cardiology

## 2023-08-23 ENCOUNTER — Encounter: Payer: Self-pay | Admitting: Cardiology

## 2023-08-23 VITALS — BP 130/86 | HR 72 | Ht 60.0 in | Wt 205.0 lb

## 2023-08-23 DIAGNOSIS — Z79899 Other long term (current) drug therapy: Secondary | ICD-10-CM | POA: Diagnosis not present

## 2023-08-23 DIAGNOSIS — I484 Atypical atrial flutter: Secondary | ICD-10-CM

## 2023-08-23 DIAGNOSIS — Z95818 Presence of other cardiac implants and grafts: Secondary | ICD-10-CM

## 2023-08-23 DIAGNOSIS — I4819 Other persistent atrial fibrillation: Secondary | ICD-10-CM

## 2023-08-23 NOTE — Progress Notes (Signed)
Electrophysiology Office Follow up Visit Note:    Date:  08/23/2023   ID:  Amy Williamson, DOB May 22, 1951, MRN [id number]  PCP:  Myrlene Broker, MD  [facility] Cardiologist:  None  [facility] Electrophysiologist:  Lanier Prude, MD    Interval History:     Amy Williamson is a 73 y.o. female who presents for a follow up visit.   She has a history of atrial fibrillation and underwent catheter ablation of her A-fib on March 30, 2022.  During that procedure the pulmonary veins and posterior wall were isolated.  She subsequently underwent left atrial appendage occlusion June 28, 2022.  Follow-up CT scan September 08, 2022 showed a well endothelialized 27 mm Watchman device.  She presented to the emergency department July 04, 2023 with rapidly conducted atrial flutter.  Eliquis was restarted and she was set up for outpatient cardioversion.  She followed up with Georgie Chard July 08, 2023.  At that appointment she reported rapidly conducted atrial arrhythmias that were symptomatic.  She experiences fatigue and palpitations while in atrial fibrillation.  She was tolerating the anticoagulation.  She was started on amiodarone by Noreene Larsson in December.    She saw Dr. Izora Ribas August 07, 2023.  At that appointment she had undergone cardioversion on July 29, 2023.  She was taking amiodarone at the time of that visit.  She is with her husband today in clinic.  She reports poorly tolerating the amiodarone.  She reports anxiety and depression and being highly emotional while on the medication.  She is very eager to stop it.      Past medical, surgical, social and family history were reviewed.  ROS:   Please see the history of present illness.    All other systems reviewed and are negative.  EKGs/Labs/Other Studies Reviewed:    The following studies were reviewed today:  July 04, 2023 EKG reviewed and shows atypical atrial flutter.  Ventricular rate was  108 bpm.  QTc 430 ms.  August 07, 2023 EKG shows sinus rhythm, QTc 420 ms. EKG Interpretation Date/Time:  Friday August 23 2023 14:07:55 EST Ventricular Rate:  72 PR Interval:  208 QRS Duration:  86 QT Interval:  406 QTC Calculation: 444 R Axis:   60  Text Interpretation: Normal sinus rhythm Confirmed by Steffanie Dunn [phone]) on 08/23/2023 2:23:33 PM    Physical Exam:    VS:  BP 130/86 (BP Location: Left Arm, Patient Position: Sitting, Cuff Size: Large)   Pulse 72   Ht 5' (1.524 m)   Wt 205 lb (93 kg)   LMP 07/23/2004   SpO2 96%   BMI 40.04 kg/m     Wt Readings from Last 3 Encounters:  08/23/23 205 lb (93 kg)  08/07/23 205 lb 9.6 oz (93.3 kg)  07/11/23 204 lb (92.5 kg)     GEN: no distress CARD: RRR, No MRG RESP: No IWOB. CTAB.      ASSESSMENT:    1. Atypical atrial flutter (HCC)   2. Persistent atrial fibrillation (HCC)   3. Presence of Watchman left atrial appendage closure device   4. Encounter for long-term (current) use of high-risk medication    PLAN:    In order of problems listed above:  #Persistent atrial fibrillation #Atypical atrial flutter #Watchman in situ Symptomatic recurrence of atrial fibrillation and flutter in December 2024 after a previous catheter ablation in September 2023.  Her EKG demonstrates atypical atrial flutter.  I discussed treatment options including continuing amiodarone,  transitioning to Tikosyn or pursuing catheter ablation.  She is interested in avoiding long-term exposure to amiodarone which I think is very reasonable.  Stop amiodarone today  Plan for ablation of A-fib and atrial flutter.  Discussed treatment options today for AF including antiarrhythmic drug therapy and ablation. Discussed risks, recovery and likelihood of success with each treatment strategy. Risk, benefits, and alternatives to EP study and ablation for afib were discussed. These risks include but are not limited to stroke, bleeding, vascular damage,  tamponade, perforation, damage to the esophagus, lungs, phrenic nerve and other structures, pulmonary vein stenosis, worsening renal function, coronary vasospasm and death.  Discussed potential need for repeat ablation procedures and antiarrhythmic drugs after an initial ablation. The patient understands these risk and wishes to proceed.  We will therefore proceed with catheter ablation at the next available time.  Carto, ICE, anesthesia are requested for the procedure.    She will not need a repeat CT scan prior to the ablation given she completed the same study in February 2024.  She will continue Eliquis until 90 days following the ablation procedure.     Signed, Steffanie Dunn, MD, [facility], [facility] 08/23/2023 2:33 PM    Electrophysiology [facility]

## 2023-08-23 NOTE — Patient Instructions (Signed)
Medication Instructions:  Your physician has recommended you make the following change in your medication:  1) STOP taking amiodarone  *If you need a refill on your cardiac medications before your next appointment, please call your pharmacy*   Lab Work: BMET and CBC - prior to your ablation. This can be done at any [facility] location.    Testing/Procedures: Ablation Your physician has recommended that you have an ablation. Catheter ablation is a medical procedure used to treat some cardiac arrhythmias (irregular heartbeats). During catheter ablation, a long, thin, flexible tube is put into a blood vessel in your groin (upper thigh), or neck. This tube is called an ablation catheter. It is then guided to your heart through the blood vessel. Radio frequency waves destroy small areas of heart tissue where abnormal heartbeats may cause an arrhythmia to start. Please see the instruction sheet given to you today.  Follow-Up At [facility], you and your health needs are our priority.  As part of our continuing mission to provide you with exceptional heart care, we have created designated Provider Care Teams.  These Care Teams include your primary Cardiologist (physician) and Advanced Practice Providers (APPs -  Physician Assistants and Nurse Practitioners) who all work together to provide you with the care you need, when you need it.  Your next appointment:  We will call you to schedule your follow up appointments

## 2023-08-24 ENCOUNTER — Other Ambulatory Visit: Payer: Self-pay | Admitting: Nurse Practitioner

## 2023-09-05 ENCOUNTER — Telehealth: Payer: Self-pay | Admitting: Internal Medicine

## 2023-09-05 DIAGNOSIS — I4819 Other persistent atrial fibrillation: Secondary | ICD-10-CM

## 2023-09-05 MED ORDER — APIXABAN 5 MG PO TABS
5.0000 mg | ORAL_TABLET | Freq: Two times a day (BID) | ORAL | 1 refills | Status: DC
Start: 2023-09-05 — End: 2024-02-24

## 2023-09-05 NOTE — Telephone Encounter (Signed)
*  STAT* If patient is at the pharmacy, call can be transferred to refill team.   1. Which medications need to be refilled? (please list name of each medication and dose if known)   apixaban (ELIQUIS) 5 MG TABS tablet   2. Would you like to learn more about the convenience, safety, & potential cost savings by using the [facility]?   3. Are you open to using the [facility] (Type [facility]. ).  4. Which pharmacy/location (including street and city if local pharmacy) is medication to be sent to?  [id number] - [place], [place] - [name]   5. Do they need a 30 day or 90 day supply?   90 day  Patient stated she has 2 days worth of medication left.

## 2023-09-05 NOTE — Telephone Encounter (Signed)
Prescription refill request for Eliquis received. Indication:Aflutter Last office visit: 08/23/23 Lalla Brothers)  Scr: 0.82 (07/04/23)  Age: 73 Weight: 93kg  Appropriate dose. Refill sent.

## 2023-09-09 ENCOUNTER — Telehealth (HOSPITAL_COMMUNITY): Payer: Self-pay

## 2023-09-09 NOTE — Telephone Encounter (Signed)
 Attempted to reach patient to discuss upcoming procedure, no answer. Left VM for patient to return call.

## 2023-09-09 NOTE — Telephone Encounter (Signed)
Spoke with patient to complete one month pre-procedure call.     New medical conditions? No Recent hospitalizations or surgeries? No Started any new medications? No Patient made aware to contact office to inform of any new medications started. Any changes in activities of daily living? No  Pre-procedure testing scheduled: CT on 09/05/22 and lab work ordered. Confirmed patient is taking Eliquis and will continue taking medication before procedure or it may need to be rescheduled.  Confirmed patient is scheduled for Atrial Fibrillation Ablation on Monday, March 17 with Dr. Steffanie Dunn. Instructed patient to arrive at the Main Entrance A at [facility]: [address] [place], [place] [address] and check in at Admitting at 5:30 AM.  Advised of plan to go home the same day and will only stay overnight if medically necessary. You MUST have a responsible adult to drive you home and MUST be with you the first 24 hours after you arrive home or your procedure could be cancelled.  Patient verbalized understanding to information provided and is agreeable to proceed with procedure.

## 2023-09-13 DIAGNOSIS — I10 Essential (primary) hypertension: Secondary | ICD-10-CM | POA: Diagnosis not present

## 2023-09-13 DIAGNOSIS — I480 Paroxysmal atrial fibrillation: Secondary | ICD-10-CM | POA: Diagnosis not present

## 2023-09-13 DIAGNOSIS — R600 Localized edema: Secondary | ICD-10-CM | POA: Diagnosis not present

## 2023-09-13 DIAGNOSIS — I272 Pulmonary hypertension, unspecified: Secondary | ICD-10-CM | POA: Diagnosis not present

## 2023-09-13 DIAGNOSIS — I4892 Unspecified atrial flutter: Secondary | ICD-10-CM | POA: Diagnosis not present

## 2023-09-13 DIAGNOSIS — E782 Mixed hyperlipidemia: Secondary | ICD-10-CM | POA: Diagnosis not present

## 2023-09-13 DIAGNOSIS — Z95818 Presence of other cardiac implants and grafts: Secondary | ICD-10-CM | POA: Diagnosis not present

## 2023-09-14 LAB — LIPID PANEL
Chol/HDL Ratio: 2.9 {ratio} (ref 0.0–4.4)
Cholesterol, Total: 208 mg/dL — ABNORMAL HIGH (ref 100–199)
HDL: 72 mg/dL (ref >39)
LDL Chol Calc (NIH): 119 mg/dL — ABNORMAL HIGH (ref 0–99)
Triglycerides: 98 mg/dL (ref 0–149)
VLDL Cholesterol Cal: 17 mg/dL (ref 5–40)

## 2023-09-15 ENCOUNTER — Encounter: Payer: Self-pay | Admitting: Internal Medicine

## 2023-09-15 DIAGNOSIS — E782 Mixed hyperlipidemia: Secondary | ICD-10-CM

## 2023-09-16 ENCOUNTER — Encounter: Payer: Self-pay | Admitting: Cardiology

## 2023-09-16 ENCOUNTER — Other Ambulatory Visit: Payer: Self-pay

## 2023-09-16 DIAGNOSIS — I484 Atypical atrial flutter: Secondary | ICD-10-CM

## 2023-09-16 DIAGNOSIS — I4819 Other persistent atrial fibrillation: Secondary | ICD-10-CM

## 2023-09-16 DIAGNOSIS — Z79899 Other long term (current) drug therapy: Secondary | ICD-10-CM

## 2023-09-16 DIAGNOSIS — Z95818 Presence of other cardiac implants and grafts: Secondary | ICD-10-CM

## 2023-09-17 DIAGNOSIS — Z95818 Presence of other cardiac implants and grafts: Secondary | ICD-10-CM | POA: Diagnosis not present

## 2023-09-17 DIAGNOSIS — Z79899 Other long term (current) drug therapy: Secondary | ICD-10-CM | POA: Diagnosis not present

## 2023-09-17 DIAGNOSIS — I4819 Other persistent atrial fibrillation: Secondary | ICD-10-CM | POA: Diagnosis not present

## 2023-09-17 DIAGNOSIS — I484 Atypical atrial flutter: Secondary | ICD-10-CM | POA: Diagnosis not present

## 2023-09-17 NOTE — Addendum Note (Signed)
 Addended by: Macie Burows on: 09/17/2023 04:00 PM   Modules accepted: Orders

## 2023-09-18 LAB — BASIC METABOLIC PANEL
BUN/Creatinine Ratio: 22 (ref 12–28)
BUN: 20 mg/dL (ref 8–27)
CO2: 24 mmol/L (ref 20–29)
Calcium: 9.6 mg/dL (ref 8.7–10.3)
Chloride: 102 mmol/L (ref 96–106)
Creatinine, Ser: 0.92 mg/dL (ref 0.57–1.00)
Glucose: 132 mg/dL — ABNORMAL HIGH (ref 70–99)
Potassium: 4.8 mmol/L (ref 3.5–5.2)
Sodium: 144 mmol/L (ref 134–144)
eGFR: 66 mL/min/{1.73_m2} (ref >59)

## 2023-09-18 LAB — CBC
Hematocrit: 38.5 % (ref 34.0–46.6)
Hemoglobin: 12.4 g/dL (ref 11.1–15.9)
MCH: 26.3 pg — ABNORMAL LOW (ref 26.6–33.0)
MCHC: 32.2 g/dL (ref 31.5–35.7)
MCV: 82 fL (ref 79–97)
Platelets: 264 10*3/uL (ref 150–450)
RBC: 4.71 x10E6/uL (ref 3.77–5.28)
RDW: 15.8 % — ABNORMAL HIGH (ref 11.7–15.4)
WBC: 9 10*3/uL (ref 3.4–10.8)

## 2023-09-23 ENCOUNTER — Other Ambulatory Visit: Payer: Self-pay | Admitting: Internal Medicine

## 2023-09-23 DIAGNOSIS — Z1231 Encounter for screening mammogram for malignant neoplasm of breast: Secondary | ICD-10-CM

## 2023-09-30 ENCOUNTER — Telehealth (HOSPITAL_COMMUNITY): Payer: Self-pay

## 2023-09-30 NOTE — Telephone Encounter (Signed)
 Call placed to patient to discuss upcoming procedure.   CT: completed and acceptable.  Labs: completed and acceptable.   Any recent signs of acute illness or been started on antibiotics? No Any diabetic medications to hold? No  Any missed doses of blood thinner? No  Advised patient to continue taking ANTICOAGULANT: Eliquis (Apixaban) without missing any doses.  Medication instructions:  On the morning of your procedure DO NOT take any medication., including Eliquis or the procedure may be rescheduled. Nothing to eat or drink after midnight prior to your procedure.  Confirmed patient is scheduled for Atrial Fibrillation Ablation on Monday, March 17 with Dr. Steffanie Dunn. Instructed patient to arrive at the Main Entrance A at [facility]: [address] [place], [place] [address] and check in at Admitting at 5:30 AM  Advised of plan to go home the same day and will only stay overnight if medically necessary. You MUST have a responsible adult to drive you home and MUST be with you the first 24 hours after you arrive home or your procedure could be cancelled.  Patient verbalized understanding to all instructions provided and agreed to proceed with procedure.

## 2023-10-02 NOTE — Pre-Procedure Instructions (Signed)
 Patient was in contact with short stay regarding her procedure for Monday.  She says she worked outside yesterday and is now having allergy symptoms, runny nose, an occasional cough, voice hoarse.  Spoke with Dr Lalla Brothers will call her on Friday to confirm she is still feeling good and not gotten any worse.  I recommended to talk to her pharmacy to see what she can take for her allergies.

## 2023-10-04 ENCOUNTER — Encounter: Payer: Self-pay | Admitting: Emergency Medicine

## 2023-10-04 NOTE — Pre-Procedure Instructions (Signed)
 Instructed patient on the following items: Arrival time 0530 Nothing to eat or drink after midnight No meds AM of procedure Responsible person to drive you home and stay with you for 24 hrs  Have you missed any doses of anti-coagulant Eliquis- takes twice a day, hasn't missed any doses.  Don't take dose morning of procedure.  States congestion is getting better.

## 2023-10-06 ENCOUNTER — Ambulatory Visit: Admission: EM | Admit: 2023-10-06 | Discharge: 2023-10-06 | Disposition: A | Discharge status: Home / Self Care

## 2023-10-06 ENCOUNTER — Telehealth: Payer: Self-pay | Admitting: Physician Assistant

## 2023-10-06 ENCOUNTER — Ambulatory Visit (INDEPENDENT_AMBULATORY_CARE_PROVIDER_SITE_OTHER)

## 2023-10-06 DIAGNOSIS — J189 Pneumonia, unspecified organism: Secondary | ICD-10-CM

## 2023-10-06 DIAGNOSIS — R051 Acute cough: Secondary | ICD-10-CM

## 2023-10-06 DIAGNOSIS — R062 Wheezing: Secondary | ICD-10-CM | POA: Diagnosis not present

## 2023-10-06 DIAGNOSIS — M25559 Pain in unspecified hip: Secondary | ICD-10-CM | POA: Insufficient documentation

## 2023-10-06 DIAGNOSIS — I4819 Other persistent atrial fibrillation: Secondary | ICD-10-CM

## 2023-10-06 DIAGNOSIS — M7138 Other bursal cyst, other site: Secondary | ICD-10-CM | POA: Insufficient documentation

## 2023-10-06 DIAGNOSIS — R058 Other specified cough: Secondary | ICD-10-CM | POA: Diagnosis not present

## 2023-10-06 DIAGNOSIS — R918 Other nonspecific abnormal finding of lung field: Secondary | ICD-10-CM | POA: Diagnosis not present

## 2023-10-06 LAB — POCT INFLUENZA A/B
Influenza A, POC: NEGATIVE
Influenza B, POC: NEGATIVE

## 2023-10-06 MED ORDER — AMOXICILLIN-POT CLAVULANATE 875-125 MG PO TABS: 1.0000 | ORAL_TABLET | Freq: Two times a day (BID) | ORAL | 0 refills | Status: DC

## 2023-10-06 MED ORDER — BENZONATATE 100 MG PO CAPS: 100.0000 mg | ORAL_CAPSULE | Freq: Three times a day (TID) | ORAL | 0 refills | Status: DC

## 2023-10-06 NOTE — ED Provider Notes (Signed)
 [facility]    CSN: [id number] Arrival date & time: 10/06/23  0805      History   Chief Complaint Chief Complaint  Patient presents with   Nasal Congestion    HPI Amy Williamson is a 73 y.o. female.   73 year old female who presents urgent care with complaints of cough, wheezing, congestion, generalized bodyaches and fevers.  She reports that earlier in the week she had a mild stuffy nose but that overall that improved then on Friday she began having severe cough, wheezing, fevers and bodyaches.  She denies any known sick exposures although she does go to the store and church.  She is scheduled to have an ablation for a-fib tomorrow morning and is supposed to be at the hospital at 530.  The preop nurse did call the patient on Friday and at that time she did let them know that she was having some of the symptoms however she had not started running a fever at that time.  They told her if she started to run a fever that she would need further evaluation and possibly postponement of her procedure.  The patient is eating and drinking although her appetite is less than normal.     Past Medical History:  Diagnosis Date   Anticoagulant long-term use    eliquis --- managed by cardiology   Arthritis    BACK AND SHOULDERS   Depressive disorder, not elsewhere classified    Essential hypertension    followed by pcp and cardiology   Frequency of urination    GERD (gastroesophageal reflux disease)    Headache    migraines when younger   History of dysphagia 2015   s/p egd w/ dilation, pt did not have stricture   History of gastritis    OSA (obstructive sleep apnea)    followed by dr t. turner---  study in epic 09-03-2020 moderate osa ,  pt scheduled for cpap titration 12-08-2020- pt wears oral device, cannot wear CPAP   PAF (paroxysmal atrial fibrillation) [facility])    cardiologist-- dr Judie Petit. Izora Ribas---- new onset 06-30-2020 at pcp office visit,  s/p DCCV 07-28-2020  successful   Palpitations 06/2020   11-16-2020  per pt denies palpitations since DCCV 07-28-2020   Plantar fasciitis 08/2017   left foot    Pneumonia    PONV (postoperative nausea and vomiting)    nausea after back surgery, no issues after ablation   Presence of Watchman left atrial appendage closure device 06/28/2022   Watchman FLX 27mm with Dr. Lalla Brothers   Pure hypercholesterolemia     Patient Active Problem List   Diagnosis Date Noted   Greater trochanteric pain syndrome 10/06/2023   Synovial cyst of lumbar spine 10/06/2023   Atrial flutter (HCC) 08/07/2023   Bilateral lower extremity edema 08/07/2023   Pulmonary hypertension, unspecified (HCC) 08/07/2023   Coronary artery calcification 08/07/2023   Pre-diabetes 07/11/2023   Synovial cyst of lumbar facet joint 05/17/2023   Bone island 09/25/2022   Presence of Watchman left atrial appendage closure device 06/28/2022   Atrial fibrillation (HCC) 06/28/2022   Degenerative spondylolisthesis 02/19/2022   OSA (obstructive sleep apnea) 09/13/2020   Aortic atherosclerosis (HCC) 06/30/2020   Insomnia 09/19/2018   Encounter for general adult medical examination with abnormal findings 04/02/2016   Other dysphagia 09/21/2013   Morbid obesity (HCC) 03/24/2013   DJD (degenerative joint disease) 03/24/2013   GERD (gastroesophageal reflux disease) 02/27/2012   Venous insufficiency 03/04/2010   Hyperlipidemia 01/21/2008   Essential hypertension  01/21/2008   Myalgia due to statin 10/02/2007    Past Surgical History:  Procedure Laterality Date   ATRIAL FIBRILLATION ABLATION N/A 03/30/2022   Procedure: ATRIAL FIBRILLATION ABLATION;  Surgeon: Lanier Prude, MD;  Location: [facility];  Service: Cardiovascular;  Laterality: N/A;   BREAST LUMPECTOMY WITH RADIOACTIVE SEED LOCALIZATION Left 06/09/2020   Procedure: LEFT BREAST LUMPECTOMY WITH RADIOACTIVE SEED LOCALIZATION;  Surgeon: Abigail Miyamoto, MD;  Location: [facility];  Service: General;  Laterality: Left;  LMA   CARDIOVERSION N/A 07/28/2020   Procedure: CARDIOVERSION;  Surgeon: Christell Constant, MD;  Location: [facility];  Service: Cardiovascular;  Laterality: N/A;   CARDIOVERSION N/A 07/11/2023   Procedure: CARDIOVERSION;  Surgeon: Jake Bathe, MD;  Location: [facility];  Service: Cardiovascular;  Laterality: N/A;   CARPAL TUNNEL RELEASE Bilateral right 2004;  left 2006   CATARACT EXTRACTION W/ INTRAOCULAR LENS  IMPLANT, BILATERAL  2016   CESAREAN SECTION  [id number]   BILATERAL TUBAL LIGATION W/ LAST C/S   COLONOSCOPY WITH PROPOFOL  last one 08/ 2013   DILATATION & CURETTAGE/HYSTEROSCOPY WITH MYOSURE N/A 11/22/2020   Procedure: DILATATION & CURETTAGE/HYSTEROSCOPY WITH MYOSURE;  Surgeon: Jerene Bears, MD;  Location: [facility];  Service: Gynecology;  Laterality: N/A;   EAR CYST EXCISION  08/03/2011   patient states this is incorrect was a Baker's cyst popliteal area   ESOPHAGOGASTRODUODENOSCOPY (EGD) WITH ESOPHAGEAL DILATION  08/2013   KNEE ARTHROSCOPY W/ MENISCAL REPAIR Left 08-03-2011  @[facility]    and excision baker's cyst   LEFT ATRIAL APPENDAGE OCCLUSION N/A 06/28/2022   Procedure: LEFT ATRIAL APPENDAGE OCCLUSION;  Surgeon: Lanier Prude, MD;  Location: [facility];  Service: Cardiovascular;  Laterality: N/A;   LUMBAR FUSION  02/19/2022   posterior- lumbar 5-6   PULLEY RELEASE RIGHT TRIGGER FINGER Right 01/08/2011   TEE WITHOUT CARDIOVERSION N/A 06/28/2022   Procedure: TRANSESOPHAGEAL ECHOCARDIOGRAM (TEE);  Surgeon: Lanier Prude, MD;  Location: [facility];  Service: Cardiovascular;  Laterality: N/A;   TRIGGER FINGER RELEASE Bilateral left 05/06/14;  right  02-29-2014    OB History     Gravida  2   Para  2   Term      Preterm      AB      Living  2      SAB      IAB      Ectopic      Multiple      Live Births               Home Medications    Prior  to Admission medications   Medication Sig Start Date End Date Taking? Authorizing Provider  acetaminophen (TYLENOL) 650 MG CR tablet Take 1,300 mg by mouth in the morning and at bedtime.   Yes [provider]  apixaban (ELIQUIS) 5 MG TABS tablet Take 1 tablet (5 mg total) by mouth 2 (two) times daily. 09/05/23   Lanier Prude, MD  bisacodyl (DULCOLAX) 5 MG EC tablet Take 5 mg by mouth daily as needed for moderate constipation.    [provider]  Cholecalciferol (VITAMIN D3 PO) Take 1,000 Units by mouth in the morning.    [provider]  Cyanocobalamin (B-12 PO) Take 1,000 mcg by mouth in the morning.    [provider]  diltiazem (CARDIZEM CD) 240 MG 24 hr capsule Take 240 mg by mouth daily. 07/08/23  [provider]  ezetimibe (ZETIA) 10 MG tablet Take 1 tablet (10 mg total) by mouth daily. Patient taking differently: Take 10 mg by mouth at bedtime. 08/07/23   Chandrasekhar, Rondel Jumbo, MD  furosemide (LASIX) 20 MG tablet Take 1 tablet (20 mg total) by mouth daily as needed. 08/07/23   Christell Constant, MD  oxyCODONE-acetaminophen (PERCOCET/ROXICET) 5-325 MG tablet Take 1-2 tablets by mouth every 6 (six) hours as needed. 06/11/23   [provider]  pantoprazole (PROTONIX) 40 MG tablet Take 1 tablet (40 mg total) by mouth daily. 07/09/23   Myrlene Broker, MD    Family History Family History  Problem Relation Age of Onset   Heart failure Mother    Osteoporosis Mother    Hyperlipidemia Mother    Hypertension Mother    Heart disease Father    Clotting disorder Father    Liver cancer Brother    Liver disease Maternal Aunt        x2   Colon cancer Neg Hx     Social History Social History   Tobacco Use   Smoking status: Never   Smokeless tobacco: Never   Tobacco comments:    Never smoke 10/11/21  Vaping Use   Vaping status: Never Used  Substance Use Topics   Alcohol use: No   Drug use: Never     Allergies    Tramadol, Atenolol, Avelox [moxifloxacin hcl in nacl], Azithromycin, Doxycycline, Moxifloxacin hcl, Myrbetriq [mirabegron], Naproxen, and Oxybutynin   Review of Systems Review of Systems  Constitutional:  Positive for appetite change, chills, fatigue and fever.  HENT:  Positive for congestion, sinus pressure and sore throat. Negative for ear pain.   Eyes:  Negative for pain and visual disturbance.  Respiratory:  Positive for cough and wheezing. Negative for shortness of breath.   Cardiovascular:  Negative for chest pain and palpitations.  Gastrointestinal:  Negative for abdominal pain and vomiting.  Genitourinary:  Negative for dysuria and hematuria.  Musculoskeletal:  Negative for arthralgias and back pain.       Generalized body aches  Skin:  Negative for color change and rash.  Neurological:  Negative for seizures and syncope.  All other systems reviewed and are negative.    Physical Exam Triage Vital Signs ED Triage Vitals  Encounter Vitals Group     BP 10/06/23 0830 (!) 142/81     Systolic BP Percentile --      Diastolic BP Percentile --      Pulse Rate 10/06/23 0830 68     Resp 10/06/23 0830 20     Temp 10/06/23 0830 98.7 F (37.1 C)     Temp Source 10/06/23 0830 Oral     SpO2 10/06/23 0830 96 %     Weight 10/06/23 0828 205 lb (93 kg)     Height 10/06/23 0828 5' (1.524 m)     Head Circumference --      Peak Flow --      Pain Score 10/06/23 0828 0     Pain Loc --      Pain Education --      Exclude from Growth Chart --    No data found.  Updated Vital Signs BP (!) 142/81 (BP Location: Right Arm)   Pulse 68   Temp 98.7 F (37.1 C) (Oral)   Resp 20   Ht 5' (1.524 m)   Wt 205 lb (93 kg)   LMP 07/23/2004   SpO2 96%   BMI 40.04 kg/m  Visual Acuity Right Eye Distance:   Left Eye Distance:   Bilateral Distance:    Right Eye Near:   Left Eye Near:    Bilateral Near:     Physical Exam Vitals and nursing note reviewed.  Constitutional:      General:  She is not in acute distress.    Appearance: She is well-developed.  HENT:     Head: Normocephalic and atraumatic.  Eyes:     Conjunctiva/sclera: Conjunctivae normal.  Cardiovascular:     Rate and Rhythm: Normal rate and regular rhythm.     Heart sounds: No murmur heard. Pulmonary:     Effort: Pulmonary effort is normal. No respiratory distress.     Breath sounds: Examination of the right-upper field reveals wheezing and rhonchi. Examination of the left-upper field reveals wheezing and rhonchi. Examination of the right-middle field reveals wheezing and rhonchi. Examination of the left-middle field reveals wheezing and rhonchi. Examination of the right-lower field reveals wheezing. Examination of the left-lower field reveals wheezing. Decreased breath sounds, wheezing and rhonchi present.  Abdominal:     Palpations: Abdomen is soft.     Tenderness: There is no abdominal tenderness.  Musculoskeletal:        General: No swelling.     Cervical back: Neck supple.  Skin:    General: Skin is warm and dry.     Capillary Refill: Capillary refill takes less than 2 seconds.  Neurological:     Mental Status: She is alert.  Psychiatric:        Mood and Affect: Mood normal.      UC Treatments / Results  Labs (all labs ordered are listed, but only abnormal results are displayed) Labs Reviewed  POCT INFLUENZA A/B    EKG   Radiology No results found.  Procedures Procedures (including critical care time)  Medications Ordered in UC Medications - No data to display  Initial Impression / Assessment and Plan / UC Course  I have reviewed the triage vital signs and the nursing notes.  Pertinent labs & imaging results that were available during my care of the patient were reviewed by me and considered in my medical decision making (see chart for details).     Acute cough - Plan: DG Chest 2 View, DG Chest 2 View  Community acquired pneumonia of right middle lobe of lung   Flu A and  flu B testing done today and are negative.  Chest x-ray done today.  Final results from the radiologist show early pneumonia on the right.  We will treat this with the following: Augmentin 875 mg twice daily for 7 days.  This is an antibiotic.  Take this with food.  Benzonatate (tessalon) 100 mg every 8 hours as needed for cough.   Please contact your cardiology office to let them know that you pneumonia so that they may decide about your procedure that is scheduled for tomorrow. Rest and stay hydrated.  Return to urgent care or PCP if symptoms worsen or fail to resolve.    Final Clinical Impressions(s) / UC Diagnoses   Final diagnoses:  None   Discharge Instructions   None    ED Prescriptions   None    PDMP not reviewed this encounter.   Landis Martins, [place] 10/06/23 [phone]

## 2023-10-06 NOTE — Discharge Instructions (Addendum)
 Flu A and flu B testing done today and are negative.  Chest x-ray done today.  Final results from the radiologist show early pneumonia on the right.  We will treat this with the following: Augmentin 875 mg twice daily for 7 days.  This is an antibiotic.  Take this with food.  Benzonatate (tessalon) 100 mg every 8 hours as needed for cough.   Please contact your cardiology office to let them know that you have pneumonia so that they may decide about your procedure that is scheduled for tomorrow. Rest and stay hydrated.  Return to urgent care or PCP if symptoms worsen or fail to resolve.

## 2023-10-06 NOTE — Telephone Encounter (Signed)
 Pt called stating she was seen in urgent care and diagnosed with pneumonia. She will need to cancel her ablation tomorrow.   I let her know someone would call and reschedule her ablation.

## 2023-10-06 NOTE — ED Triage Notes (Signed)
"  This started the first of the week with head congestion and scratchy throat, by Friday cough, fever up to 101.2 yesterday". "I am suppose to have an ablation in the morning on my heart so I need to see if I can still have this done and if anything needs to be treated". "At night the cough turns into wheezing". Seasonal Flu vaccine done, Not COVID19.

## 2023-10-07 ENCOUNTER — Encounter (HOSPITAL_COMMUNITY): Admission: RE | Payer: Self-pay | Source: Home / Self Care

## 2023-10-07 ENCOUNTER — Ambulatory Visit (HOSPITAL_COMMUNITY): Admission: RE | Admit: 2023-10-07 | Payer: PPO | Source: Home / Self Care | Admitting: Cardiology

## 2023-10-07 DIAGNOSIS — I480 Paroxysmal atrial fibrillation: Secondary | ICD-10-CM

## 2023-10-07 SURGERY — ATRIAL FIBRILLATION ABLATION
Anesthesia: General

## 2023-10-07 NOTE — Telephone Encounter (Signed)
 Pt has been rescheduled from 3/17 to 5/8 for Afib Ablation with Dr. Lalla Brothers. She will have updated labs a week or 2 before her procedure at [facility].  I will put her on Waitlist to get her in sooner if we have a cancellation.   No CT needed per CL.

## 2023-10-07 NOTE — Addendum Note (Signed)
 Addended by: Cleda Mccreedy on: 10/07/2023 11:26 AM   Modules accepted: Orders

## 2023-10-10 DIAGNOSIS — Z6839 Body mass index (BMI) 39.0-39.9, adult: Secondary | ICD-10-CM | POA: Diagnosis not present

## 2023-10-10 DIAGNOSIS — M4310 Spondylolisthesis, site unspecified: Secondary | ICD-10-CM | POA: Diagnosis not present

## 2023-11-04 ENCOUNTER — Ambulatory Visit (HOSPITAL_COMMUNITY): Admitting: Physician Assistant

## 2023-11-05 ENCOUNTER — Telehealth (HOSPITAL_COMMUNITY): Payer: Self-pay

## 2023-11-05 NOTE — Telephone Encounter (Signed)
 Spoke with patient to complete pre-procedure call.     New medical conditions?  Treated for PNA in March and reports fully recovered.  Recent hospitalizations or surgeries? No Started any new medications? No Patient made aware to contact office to inform of any new medications started. Any changes in activities of daily living? No  Pre-procedure testing scheduled: CT completed and lab work ordered. Confirmed patient is taking Eliquis and will continue taking medication before procedure or it may need to be rescheduled.  Confirmed patient is scheduled for Atrial Fibrillation Ablation on Thursday, May 8 with Dr. Harvie Liner. Instructed patient to arrive at the Main Entrance A at [facility]: [address] [place], [place] [address] and check in at Admitting at 5:30 AM  Advised of plan to go home the same day and will only stay overnight if medically necessary. You MUST have a responsible adult to drive you home and MUST be with you the first 24 hours after you arrive home or your procedure could be cancelled.  Patient verbalized understanding to information provided and is agreeable to proceed with procedure.

## 2023-11-06 ENCOUNTER — Ambulatory Visit
Admission: RE | Admit: 2023-11-06 | Discharge: 2023-11-06 | Disposition: A | Discharge status: Home / Self Care | Source: Ambulatory Visit | Attending: Internal Medicine | Admitting: Internal Medicine

## 2023-11-06 DIAGNOSIS — Z1231 Encounter for screening mammogram for malignant neoplasm of breast: Secondary | ICD-10-CM

## 2023-11-13 ENCOUNTER — Encounter: Payer: Self-pay | Admitting: Internal Medicine

## 2023-11-13 DIAGNOSIS — E782 Mixed hyperlipidemia: Secondary | ICD-10-CM | POA: Diagnosis not present

## 2023-11-13 DIAGNOSIS — I4819 Other persistent atrial fibrillation: Secondary | ICD-10-CM | POA: Diagnosis not present

## 2023-11-13 LAB — LIPID PANEL
Chol/HDL Ratio: 3.1 ratio (ref 0.0–4.4)
Cholesterol, Total: 208 mg/dL — ABNORMAL HIGH (ref 100–199)
HDL: 68 mg/dL (ref >39)
LDL Chol Calc (NIH): 122 mg/dL — ABNORMAL HIGH (ref 0–99)
Triglycerides: 101 mg/dL (ref 0–149)
VLDL Cholesterol Cal: 18 mg/dL (ref 5–40)

## 2023-11-13 LAB — CBC

## 2023-11-13 LAB — HM MAMMOGRAPHY

## 2023-11-14 ENCOUNTER — Encounter: Payer: Self-pay | Admitting: Internal Medicine

## 2023-11-14 DIAGNOSIS — E782 Mixed hyperlipidemia: Secondary | ICD-10-CM

## 2023-11-14 LAB — CBC
Hematocrit: 39.2 % (ref 34.0–46.6)
Hemoglobin: 12.6 g/dL (ref 11.1–15.9)
MCH: 27.2 pg (ref 26.6–33.0)
MCHC: 32.1 g/dL (ref 31.5–35.7)
MCV: 85 fL (ref 79–97)
Platelets: 273 10*3/uL (ref 150–450)
RBC: 4.64 x10E6/uL (ref 3.77–5.28)
RDW: 14.5 % (ref 11.7–15.4)
WBC: 8 10*3/uL (ref 3.4–10.8)

## 2023-11-14 LAB — BASIC METABOLIC PANEL WITH GFR
BUN/Creatinine Ratio: 21 (ref 12–28)
BUN: 17 mg/dL (ref 8–27)
CO2: 26 mmol/L (ref 20–29)
Calcium: 9.8 mg/dL (ref 8.7–10.3)
Chloride: 101 mmol/L (ref 96–106)
Creatinine, Ser: 0.8 mg/dL (ref 0.57–1.00)
Glucose: 100 mg/dL — ABNORMAL HIGH (ref 70–99)
Potassium: 5 mmol/L (ref 3.5–5.2)
Sodium: 140 mmol/L (ref 134–144)
eGFR: 77 mL/min/{1.73_m2} (ref >59)

## 2023-11-21 ENCOUNTER — Telehealth (HOSPITAL_COMMUNITY): Payer: Self-pay

## 2023-11-21 NOTE — Telephone Encounter (Signed)
 Call placed to patient to discuss upcoming procedure.   CT: previously completed 09/06/23 Labs: completed.   Any recent signs of acute illness or been started on antibiotics? No Any new medications started? No Any medications to hold? No Any missed doses of blood thinner? No Advised patient to continue taking ANTICOAGULANT: Eliquis  (Apixaban ) twice daily without missing any doses.  Medication instructions:  On the morning of your procedure DO NOT take any medication., including Eliquis  or the procedure may be rescheduled. Nothing to eat or drink after midnight prior to your procedure.  Confirmed patient is scheduled for Atrial Fibrillation Ablation on Thursday, May 8 with Dr. Harvie Liner. Instructed patient to arrive at the Main Entrance A at [facility]: [address] [place], [place] [address] and check in at Admitting at 5:30 AM.   Advised of plan to go home the same day and will only stay overnight if medically necessary. You MUST have a responsible adult to drive you home and MUST be with you the first 24 hours after you arrive home or your procedure could be cancelled.  Patient verbalized understanding to all instructions provided and agreed to proceed with procedure.

## 2023-11-27 NOTE — Anesthesia Preprocedure Evaluation (Addendum)
 Anesthesia Evaluation  Patient identified by MRN, date of birth, ID band Patient awake    Reviewed: Allergy & Precautions, NPO status , Patient's Chart, lab work & pertinent test results  History of Anesthesia Complications (+) PONV and history of anesthetic complications  Airway Mallampati: II  TM Distance: >3 FB Neck ROM: Full    Dental no notable dental hx. (+) Teeth Intact, Dental Advisory Given   Pulmonary sleep apnea    Pulmonary exam normal breath sounds clear to auscultation       Cardiovascular hypertension, Pt. on medications and Pt. on home beta blockers (-) angina + CAD  (-) Past MI Normal cardiovascular exam+ dysrhythmias (on eliquis ) Atrial Fibrillation  Rhythm:Irregular Rate:Normal     Neuro/Psych  Headaches PSYCHIATRIC DISORDERS  Depression       GI/Hepatic Neg liver ROS,GERD  ,,  Endo/Other  negative endocrine ROS  Class 3 obesity  Renal/GU Lab Results      Component                Value               Date                              K                        5.0                 11/13/2023                CO2                      26                  11/13/2023                BUN                      17                  11/13/2023                CREATININE               0.80                11/13/2023                       GLUCOSE                  100 (H)             11/13/2023                Musculoskeletal  (+) Arthritis , Osteoarthritis,    Abdominal   Peds  Hematology Lab Results      Component                Value               Date                      WBC                      8.0  11/13/2023                HGB                      12.6                11/13/2023                HCT                      39.2                11/13/2023                MCV                      85                  11/13/2023                PLT                      273                 11/13/2023               Anesthesia Other Findings All: see list  Reproductive/Obstetrics                             Anesthesia Physical Anesthesia Plan  ASA: 3  Anesthesia Plan: General   Post-op Pain Management: Minimal or no pain anticipated   Induction: Intravenous  PONV Risk Score and Plan: 3 and Treatment may vary due to age or medical condition, Midazolam  and Ondansetron   Airway Management Planned: Oral ETT  Additional Equipment: None  Intra-op Plan:   Post-operative Plan: Extubation in OR  Informed Consent: I have reviewed the patients History and Physical, chart, labs and discussed the procedure including the risks, benefits and alternatives for the proposed anesthesia with the patient or authorized representative who has indicated his/her understanding and acceptance.     Dental advisory given  Plan Discussed with: CRNA and Surgeon  Anesthesia Plan Comments:        Anesthesia Quick Evaluation

## 2023-11-27 NOTE — Pre-Procedure Instructions (Signed)
 Instructed patient on the following items: Arrival time 0515 Nothing to eat or drink after midnight No meds AM of procedure Responsible person to drive you home and stay with you for 24 hrs  Have you missed any doses of anti-coagulant Eliquis- takes twice a day, hasn't missed any doses.  Don't take dose morning of procedure.

## 2023-11-28 ENCOUNTER — Encounter (HOSPITAL_COMMUNITY)
Admission: RE | Disposition: A | Discharge status: Home / Self Care | Payer: Self-pay | Source: Home / Self Care | Attending: Cardiology

## 2023-11-28 ENCOUNTER — Ambulatory Visit (HOSPITAL_COMMUNITY): Payer: Self-pay | Admitting: Anesthesiology

## 2023-11-28 ENCOUNTER — Ambulatory Visit (HOSPITAL_COMMUNITY)
Admission: RE | Admit: 2023-11-28 | Discharge: 2023-11-28 | Disposition: A | Discharge status: Home / Self Care | Attending: Cardiology | Admitting: Cardiology

## 2023-11-28 ENCOUNTER — Ambulatory Visit (HOSPITAL_BASED_OUTPATIENT_CLINIC_OR_DEPARTMENT_OTHER): Payer: Self-pay | Admitting: Anesthesiology

## 2023-11-28 ENCOUNTER — Other Ambulatory Visit: Payer: Self-pay

## 2023-11-28 DIAGNOSIS — I484 Atypical atrial flutter: Secondary | ICD-10-CM | POA: Insufficient documentation

## 2023-11-28 DIAGNOSIS — I4891 Unspecified atrial fibrillation: Secondary | ICD-10-CM

## 2023-11-28 DIAGNOSIS — G4730 Sleep apnea, unspecified: Secondary | ICD-10-CM | POA: Insufficient documentation

## 2023-11-28 DIAGNOSIS — F32A Depression, unspecified: Secondary | ICD-10-CM | POA: Insufficient documentation

## 2023-11-28 DIAGNOSIS — E66813 Obesity, class 3: Secondary | ICD-10-CM | POA: Diagnosis not present

## 2023-11-28 DIAGNOSIS — I10 Essential (primary) hypertension: Secondary | ICD-10-CM

## 2023-11-28 DIAGNOSIS — Z6841 Body Mass Index (BMI) 40.0 and over, adult: Secondary | ICD-10-CM | POA: Insufficient documentation

## 2023-11-28 DIAGNOSIS — G4733 Obstructive sleep apnea (adult) (pediatric): Secondary | ICD-10-CM

## 2023-11-28 DIAGNOSIS — Z7901 Long term (current) use of anticoagulants: Secondary | ICD-10-CM | POA: Insufficient documentation

## 2023-11-28 DIAGNOSIS — I483 Typical atrial flutter: Secondary | ICD-10-CM | POA: Diagnosis not present

## 2023-11-28 DIAGNOSIS — I2510 Atherosclerotic heart disease of native coronary artery without angina pectoris: Secondary | ICD-10-CM | POA: Insufficient documentation

## 2023-11-28 DIAGNOSIS — K219 Gastro-esophageal reflux disease without esophagitis: Secondary | ICD-10-CM | POA: Diagnosis not present

## 2023-11-28 DIAGNOSIS — I4819 Other persistent atrial fibrillation: Secondary | ICD-10-CM | POA: Insufficient documentation

## 2023-11-28 DIAGNOSIS — M1990 Unspecified osteoarthritis, unspecified site: Secondary | ICD-10-CM | POA: Insufficient documentation

## 2023-11-28 HISTORY — PX: ATRIAL FIBRILLATION ABLATION: EP1191

## 2023-11-28 LAB — POCT ACTIVATED CLOTTING TIME: Activated Clotting Time: 325 s

## 2023-11-28 MED ORDER — FENTANYL CITRATE (PF) 100 MCG/2ML IJ SOLN: 25.0000 ug | INTRAMUSCULAR | Status: DC | PRN

## 2023-11-28 MED ORDER — DEXAMETHASONE SODIUM PHOSPHATE 10 MG/ML IJ SOLN
INTRAMUSCULAR | Status: DC | PRN
  Administered 2023-11-28: 10 mg via INTRAVENOUS

## 2023-11-28 MED ORDER — SODIUM CHLORIDE 0.9% FLUSH: 3.0000 mL | Freq: Two times a day (BID) | INTRAVENOUS | Status: DC

## 2023-11-28 MED ORDER — LIDOCAINE 2% (20 MG/ML) 5 ML SYRINGE
INTRAMUSCULAR | Status: DC | PRN
  Administered 2023-11-28: 60 mg via INTRAVENOUS

## 2023-11-28 MED ORDER — SODIUM CHLORIDE 0.9 % IV SOLN: 250.0000 mL | INTRAVENOUS | Status: DC | PRN

## 2023-11-28 MED ORDER — CEFAZOLIN SODIUM-DEXTROSE 2-4 GM/100ML-% IV SOLN
INTRAVENOUS | Status: AC
  Filled 2023-11-28: qty 100

## 2023-11-28 MED ORDER — OXYCODONE HCL 5 MG/5ML PO SOLN: 5.0000 mg | Freq: Once | ORAL | Status: DC | PRN

## 2023-11-28 MED ORDER — PROPOFOL 10 MG/ML IV BOLUS
INTRAVENOUS | Status: DC | PRN
  Administered 2023-11-28: 100 mg via INTRAVENOUS

## 2023-11-28 MED ORDER — SODIUM CHLORIDE 0.9% FLUSH: 3.0000 mL | INTRAVENOUS | Status: DC | PRN

## 2023-11-28 MED ORDER — ONDANSETRON HCL 4 MG/2ML IJ SOLN: 4.0000 mg | Freq: Once | INTRAMUSCULAR | Status: DC | PRN

## 2023-11-28 MED ORDER — PHENYLEPHRINE HCL-NACL 20-0.9 MG/250ML-% IV SOLN
INTRAVENOUS | Status: DC | PRN
  Administered 2023-11-28: 30 ug/min via INTRAVENOUS

## 2023-11-28 MED ORDER — HEPARIN (PORCINE) IN NACL 2000-0.9 UNIT/L-% IV SOLN
INTRAVENOUS | Status: DC | PRN
  Administered 2023-11-28 (×4): 1000 mL

## 2023-11-28 MED ORDER — PANTOPRAZOLE SODIUM 40 MG PO TBEC
40.0000 mg | DELAYED_RELEASE_TABLET | Freq: Every day | ORAL | Status: DC
  Administered 2023-11-28: 40 mg via ORAL
  Filled 2023-11-28: qty 1

## 2023-11-28 MED ORDER — PROTAMINE SULFATE 10 MG/ML IV SOLN
INTRAVENOUS | Status: DC | PRN
  Administered 2023-11-28: 35 mg via INTRAVENOUS

## 2023-11-28 MED ORDER — FENTANYL CITRATE (PF) 100 MCG/2ML IJ SOLN
INTRAMUSCULAR | Status: AC
  Filled 2023-11-28: qty 2

## 2023-11-28 MED ORDER — APIXABAN 5 MG PO TABS
5.0000 mg | ORAL_TABLET | Freq: Two times a day (BID) | ORAL | Status: DC
  Administered 2023-11-28: 5 mg via ORAL
  Filled 2023-11-28: qty 1

## 2023-11-28 MED ORDER — ONDANSETRON HCL 4 MG/2ML IJ SOLN: 4.0000 mg | Freq: Four times a day (QID) | INTRAMUSCULAR | Status: DC | PRN

## 2023-11-28 MED ORDER — PHENYLEPHRINE 80 MCG/ML (10ML) SYRINGE FOR IV PUSH (FOR BLOOD PRESSURE SUPPORT)
PREFILLED_SYRINGE | INTRAVENOUS | Status: DC | PRN
  Administered 2023-11-28: 80 ug via INTRAVENOUS

## 2023-11-28 MED ORDER — ROCURONIUM BROMIDE 10 MG/ML (PF) SYRINGE
PREFILLED_SYRINGE | INTRAVENOUS | Status: DC | PRN
  Administered 2023-11-28: 70 mg via INTRAVENOUS
  Administered 2023-11-28: 10 mg via INTRAVENOUS

## 2023-11-28 MED ORDER — HYDROMORPHONE HCL 1 MG/ML IJ SOLN: 0.2500 mg | INTRAMUSCULAR | Status: DC | PRN

## 2023-11-28 MED ORDER — ACETAMINOPHEN 10 MG/ML IV SOLN: 1000.0000 mg | Freq: Once | INTRAVENOUS | Status: DC | PRN

## 2023-11-28 MED ORDER — CEFAZOLIN SODIUM-DEXTROSE 2-3 GM-%(50ML) IV SOLR
INTRAVENOUS | Status: DC | PRN
  Administered 2023-11-28: 2 g via INTRAVENOUS

## 2023-11-28 MED ORDER — FENTANYL CITRATE (PF) 250 MCG/5ML IJ SOLN
INTRAMUSCULAR | Status: DC | PRN
  Administered 2023-11-28 (×2): 50 ug via INTRAVENOUS

## 2023-11-28 MED ORDER — ONDANSETRON HCL 4 MG/2ML IJ SOLN
INTRAMUSCULAR | Status: DC | PRN
  Administered 2023-11-28: 4 mg via INTRAVENOUS

## 2023-11-28 MED ORDER — ACETAMINOPHEN 325 MG PO TABS: 650.0000 mg | ORAL_TABLET | ORAL | Status: DC | PRN

## 2023-11-28 MED ORDER — HEPARIN SODIUM (PORCINE) 1000 UNIT/ML IJ SOLN
INTRAMUSCULAR | Status: DC | PRN
  Administered 2023-11-28: 14000 [IU] via INTRAVENOUS

## 2023-11-28 MED ORDER — SODIUM CHLORIDE 0.9 % IV SOLN: INTRAVENOUS | Status: DC

## 2023-11-28 MED ORDER — OXYCODONE HCL 5 MG PO TABS: 5.0000 mg | ORAL_TABLET | Freq: Once | ORAL | Status: DC | PRN

## 2023-11-28 MED ORDER — SUGAMMADEX SODIUM 200 MG/2ML IV SOLN
INTRAVENOUS | Status: DC | PRN
  Administered 2023-11-28: 372 mg via INTRAVENOUS

## 2023-11-28 MED ORDER — HEPARIN SODIUM (PORCINE) 1000 UNIT/ML IJ SOLN
INTRAMUSCULAR | Status: AC
  Filled 2023-11-28: qty 10

## 2023-11-28 MED ORDER — ATROPINE SULFATE 1 MG/10ML IJ SOSY
PREFILLED_SYRINGE | INTRAMUSCULAR | Status: DC | PRN
  Administered 2023-11-28: 1 mg via INTRAVENOUS

## 2023-11-28 NOTE — Anesthesia Procedure Notes (Signed)
 Procedure Name: Intubation Date/Time: 11/28/2023 7:45 AM  Performed by: Merna Aase, CRNAPre-anesthesia Checklist: Patient identified, Patient being monitored, Timeout performed, Emergency Drugs available and Suction available Patient Re-evaluated:Patient Re-evaluated prior to induction Oxygen Delivery Method: Circle system utilized Preoxygenation: Pre-oxygenation with 100% oxygen Induction Type: IV induction Ventilation: Mask ventilation without difficulty Laryngoscope Size: Mac and 4 Grade View: Grade II Tube type: Oral Tube size: 7.0 mm Number of attempts: 1 Airway Equipment and Method: Stylet Placement Confirmation: ETT inserted through vocal cords under direct vision, positive ETCO2 and breath sounds checked- equal and bilateral Secured at: 21 cm Tube secured with: Tape Dental Injury: Teeth and Oropharynx as per pre-operative assessment

## 2023-11-28 NOTE — Discharge Instructions (Signed)

## 2023-11-28 NOTE — Anesthesia Postprocedure Evaluation (Signed)
 Anesthesia Post Note  Patient: Amy Williamson  Procedure(s) Performed: ATRIAL FIBRILLATION ABLATION     Patient location during evaluation: PACU Anesthesia Type: General Level of consciousness: awake and alert Pain management: pain level controlled Vital Signs Assessment: post-procedure vital signs reviewed and stable Respiratory status: spontaneous breathing, nonlabored ventilation, respiratory function stable and patient connected to nasal cannula oxygen Cardiovascular status: blood pressure returned to baseline and stable Postop Assessment: no apparent nausea or vomiting Anesthetic complications: no  There were no known notable events for this encounter.  Last Vitals:  Vitals:   11/28/23 1200 11/28/23 1300  BP: 135/72 (!) 140/75  Pulse: 91 91  Resp: 18 18  Temp:    SpO2: 95% 91%    Last Pain:  Vitals:   11/28/23 1000  TempSrc:   PainSc: 0-No pain                 Rosalita Combe

## 2023-11-28 NOTE — Transfer of Care (Signed)
 Immediate Anesthesia Transfer of Care Note  Patient: Amy Williamson  Procedure(s) Performed: ATRIAL FIBRILLATION ABLATION  Patient Location: PACU  Anesthesia Type:General  Level of Consciousness: drowsy and responds to stimulation  Airway & Oxygen Therapy: Patient Spontanous Breathing and Patient connected to nasal cannula oxygen  Post-op Assessment: Report given to RN and Post -op Vital signs reviewed and stable  Post vital signs: Reviewed and stable  Last Vitals:  Vitals Value Taken Time  BP    Temp    Pulse 84 11/28/23 0936  Resp 17 11/28/23 0936  SpO2 95 % 11/28/23 0936  Vitals shown include unfiled device data.  Last Pain:  Vitals:   11/28/23 0556  TempSrc: Oral  PainSc:       Patients Stated Pain Goal: 5 (11/28/23 0545)  Complications: There were no known notable events for this encounter.

## 2023-11-28 NOTE — Progress Notes (Signed)
 Patient and husband was given discharge instructions. Both verbalized understanding.

## 2023-11-28 NOTE — H&P (Signed)
 Electrophysiology Office Follow up Visit Note:     Date:  11/28/2023    ID:  Amy Williamson, DOB 10-17-1950, MRN [id number]   PCP:  Adelia Homestead, MD       [facility] Cardiologist:  None  [facility] Electrophysiologist:  Boyce Byes, MD      Interval History:       Amy Williamson is a 73 y.o. female who presents for a follow up visit.    She has a history of atrial fibrillation and underwent catheter ablation of her A-fib on March 30, 2022.  During that procedure the pulmonary veins and posterior wall were isolated.  She subsequently underwent left atrial appendage occlusion June 28, 2022.  Follow-up CT scan September 08, 2022 showed a well endothelialized 27 mm Watchman device.   She presented to the emergency department July 04, 2023 with rapidly conducted atrial flutter.  Eliquis  was restarted and she was set up for outpatient cardioversion.  She followed up with Drema Genta July 08, 2023.  At that appointment she reported rapidly conducted atrial arrhythmias that were symptomatic.  She experiences fatigue and palpitations while in atrial fibrillation.  She was tolerating the anticoagulation.  She was started on amiodarone  by Aleda Hurl in December.     She saw Dr. Paulita Boss August 07, 2023.  At that appointment she had undergone cardioversion on July 29, 2023.  She was taking amiodarone  at the time of that visit.   She is with her husband today in clinic.  She reports poorly tolerating the amiodarone .  She reports anxiety and depression and being highly emotional while on the medication.  She is very eager to stop it.  Presents for AF ablation today. Procedure reviewed.  Objective Past medical, surgical, social and family history were reviewed.   ROS:   Please see the history of present illness.    All other systems reviewed and are negative.   EKGs/Labs/Other Studies Reviewed:     The following studies were reviewed today:   July 04, 2023 EKG reviewed and shows atypical atrial flutter.  Ventricular rate was 108 bpm.  QTc 430 ms.   August 07, 2023 EKG shows sinus rhythm, QTc 420 ms. EKG Interpretation Date/Time:                  Friday August 23 2023 14:07:55 EST Ventricular Rate:         72 PR Interval:                 208 QRS Duration:             86 QT Interval:                 406 QTC Calculation:444 R Axis:                         60   Text Interpretation:Normal sinus rhythm Confirmed by Harvie Liner [phone]) on 08/23/2023 2:23:33 PM     Physical Exam:     VS:  BP 169/74 (BP Location: Left Arm, Patient Position: Sitting, Cuff Size: Large)   Pulse 76   Ht 5' (1.524 m)   Wt 205 lb (93 kg)   LMP 07/23/2004   SpO2 96%   BMI 40.04 kg/m         Wt Readings from Last 3 Encounters:  08/23/23 205 lb (93 kg)  08/07/23 205 lb 9.6 oz (93.3 kg)  07/11/23 204  lb (92.5 kg)      GEN: no distress CARD: RRR, No MRG RESP: No IWOB. CTAB.     Assessment ASSESSMENT:     1. Atypical atrial flutter (HCC)   2. Persistent atrial fibrillation (HCC)   3. Presence of Watchman left atrial appendage closure device   4. Encounter for long-term (current) use of high-risk medication     PLAN:     In order of problems listed above:   #Persistent atrial fibrillation #Atypical atrial flutter #Watchman in situ Symptomatic recurrence of atrial fibrillation and flutter in December 2024 after a previous catheter ablation in September 2023.  Her EKG demonstrates atypical atrial flutter.  I discussed treatment options including continuing amiodarone , transitioning to Tikosyn or pursuing catheter ablation.  She is interested in avoiding long-term exposure to amiodarone  which I think is very reasonable.   Stop amiodarone  today   Plan for ablation of A-fib and atrial flutter.   Discussed treatment options today for AF including antiarrhythmic drug therapy and ablation. Discussed risks, recovery and likelihood of success  with each treatment strategy. Risk, benefits, and alternatives to EP study and ablation for afib were discussed. These risks include but are not limited to stroke, bleeding, vascular damage, tamponade, perforation, damage to the esophagus, lungs, phrenic nerve and other structures, pulmonary vein stenosis, worsening renal function, coronary vasospasm and death.  Discussed potential need for repeat ablation procedures and antiarrhythmic drugs after an initial ablation. The patient understands these risk and wishes to proceed.  We will therefore proceed with catheter ablation at the next available time.  Carto, ICE, anesthesia are requested for the procedure.     She will not need a repeat CT scan prior to the ablation given she completed the same study in February 2024.   She will continue Eliquis  until 90 days following the ablation procedure.    Presents for redo AF ablation today. Procedure reviewed.      Signed, Harvie Liner, MD, [facility], [facility] 11/28/2023 Electrophysiology [facility]

## 2023-11-29 ENCOUNTER — Encounter (HOSPITAL_COMMUNITY): Payer: Self-pay | Admitting: Cardiology

## 2023-11-29 ENCOUNTER — Telehealth (HOSPITAL_COMMUNITY): Payer: Self-pay

## 2023-11-29 NOTE — Telephone Encounter (Signed)
 Spoke with patient to complete post procedure follow up call.  Patient reports no complications with groin sites.   Instructions reviewed with patient:  Remove large bandage at puncture site after 24 hours. It is normal to have bruising, tenderness and a pea or marble sized lump/knot at the groin site which can take up to three months to resolve.  Get help right away if you notice sudden swelling at the puncture site.  Check your puncture site every day for signs of infection: fever, redness, swelling, pus drainage, warmth, foul odor or excessive pain. If this occurs, please call the office at [phone], to speak with the nurse. Get help right away if your puncture site is bleeding and the bleeding does not stop after applying firm pressure to the area.  You may continue to have skipped beats/ atrial fibrillation during the first several months after your procedure.  It is very important not to miss any doses of your blood thinner Eliquis . Patient restarted taking this medication on 11/28/23.   You will follow up with the Afib clinic on 12/30/23 and follow up with the APP on 02/26/24.   Patient verbalized understanding to all instructions provided.

## 2023-12-30 ENCOUNTER — Encounter (HOSPITAL_COMMUNITY): Payer: Self-pay | Admitting: Physician Assistant

## 2023-12-30 ENCOUNTER — Ambulatory Visit (HOSPITAL_COMMUNITY)
Admission: RE | Admit: 2023-12-30 | Discharge: 2023-12-30 | Disposition: A | Discharge status: Home / Self Care | Source: Ambulatory Visit | Attending: Physician Assistant | Admitting: Physician Assistant

## 2023-12-30 VITALS — BP 138/88 | HR 65 | Ht 60.0 in | Wt 213.0 lb

## 2023-12-30 DIAGNOSIS — I4819 Other persistent atrial fibrillation: Secondary | ICD-10-CM | POA: Diagnosis not present

## 2023-12-30 DIAGNOSIS — D6869 Other thrombophilia: Secondary | ICD-10-CM | POA: Diagnosis not present

## 2023-12-30 DIAGNOSIS — R5382 Chronic fatigue, unspecified: Secondary | ICD-10-CM | POA: Diagnosis not present

## 2023-12-30 NOTE — Progress Notes (Signed)
 Primary Care Physician: Adelia Homestead, MD Primary Cardiologist: Dr Paulita Boss Primary Electrophysiologist: Dr Marven Slimmer Referring Physician: Lawana Pray NP   Amy Williamson is a 73 y.o. female with a history of HTN, aortic atherosclerosis, OSA, HLD, atrial fibrillation who presents for follow up in the [facility].  The patient was initially diagnosed with atrial fibrillation 06/2020 and underwent DCCV on 07/28/20. She presented to the ED 09/26/21 with symptoms palpitations and underwent DCCV but unfortunately returned to the ED on 10/06/21 and had another DCCV. She has symptoms of fatigue while in afib and for several days afterwards. She did have a steroid injection for bursitis a week before her first ED visit. She was started on flecainide  but had intolerable side effects. She did have a breakthrough episode of afib 12/12/21 and took a PRN dose of flecainide  which converted her to SR.   S/p afib ablation 03/30/22 with Dr Marven Slimmer. S/p Watchman implant 06/28/22. She presented to the emergency department July 04, 2023 with rapidly conducted atrial flutter. Eliquis  was restarted and she was set up for outpatient cardioversion. She was started on amiodarone  and underwent DCCV on 07/11/23. She then underwent repeat ablation, afib and flutter, with Dr Marven Slimmer on 11/28/23. Amiodarone  was discontinue prior to ablation.   Patient returns for follow up for atrial fibrillation. She remains in SR today. She denies any interim episodes of afib since the ablation. She denies chest pain or groin issues. She reports fatigue with exertion which has been longstanding prior to her ablation. Her fatigue did not improve with SR.   Today, she  denies symptoms of palpitations, chest pain, shortness of breath, orthopnea, PND, lower extremity edema, dizziness, presyncope, syncope, bleeding, or neurologic sequela. The patient is tolerating medications without difficulties and is otherwise  without complaint today.    Atrial Fibrillation Risk Factors:  she does have symptoms or diagnosis of sleep apnea. she does not have a history of rheumatic fever. she does not have a history of alcohol use. The patient does not have a history of early familial atrial fibrillation or other arrhythmias.   Atrial Fibrillation Management history:  Previous antiarrhythmic drugs: flecainide , amiodarone  (did not tolerate well)  Previous cardioversions: 07/28/20, 09/26/21, 10/06/21, 07/11/23 Previous ablations: 03/30/22, 11/28/23 Anticoagulation history: Eliquis    Past Medical History:  Diagnosis Date   Anticoagulant long-term use    eliquis  --- managed by cardiology   Arthritis    BACK AND SHOULDERS   Depressive disorder, not elsewhere classified    Essential hypertension    followed by pcp and cardiology   Frequency of urination    GERD (gastroesophageal reflux disease)    Headache    migraines when younger   History of dysphagia 2015   s/p egd w/ dilation, pt did not have stricture   History of gastritis    OSA (obstructive sleep apnea)    followed by dr t. turner---  study in epic 09-03-2020 moderate osa ,  pt scheduled for cpap titration 12-08-2020- pt wears oral device, cannot wear CPAP   PAF (paroxysmal atrial fibrillation) [facility])    cardiologist-- dr Junior Olea---- new onset 06-30-2020 at pcp office visit,  s/p DCCV 07-28-2020 successful   Palpitations 06/2020   11-16-2020  per pt denies palpitations since DCCV 07-28-2020   Plantar fasciitis 08/2017   left foot    Pneumonia    PONV (postoperative nausea and vomiting)    nausea after back surgery, no issues after ablation   Presence of  Watchman left atrial appendage closure device 06/28/2022   Watchman FLX 27mm with Dr. Marven Slimmer   Pure hypercholesterolemia     Current Outpatient Medications  Medication Sig Dispense Refill   acetaminophen  (TYLENOL ) 650 MG CR tablet Take 1,300 mg by mouth in the morning and at bedtime.      apixaban  (ELIQUIS ) 5 MG TABS tablet Take 1 tablet (5 mg total) by mouth 2 (two) times daily. 180 tablet 1   benzonatate  (TESSALON ) 100 MG capsule Take 1 capsule (100 mg total) by mouth every 8 (eight) hours. 21 capsule 0   bisacodyl  (DULCOLAX) 5 MG EC tablet Take 5 mg by mouth daily as needed for moderate constipation.     Cholecalciferol (VITAMIN D3 PO) Take 1,000 Units by mouth in the morning.     Cyanocobalamin  (B-12 PO) Take 1,000 mcg by mouth in the morning.     diltiazem  (CARDIZEM  CD) 240 MG 24 hr capsule Take 240 mg by mouth daily.     furosemide  (LASIX ) 20 MG tablet Take 1 tablet (20 mg total) by mouth daily as needed. 30 tablet 11   pantoprazole  (PROTONIX ) 40 MG tablet Take 1 tablet (40 mg total) by mouth daily. 100 tablet 3   No current facility-administered medications for this encounter.    ROS- All systems are reviewed and negative except as per the HPI above.  Physical Exam: Vitals:   12/30/23 1052  BP: 138/88  Pulse: 65  Weight: 96.6 kg  Height: 5' (1.524 m)     GEN: Well nourished, well developed in no acute distress CARDIAC: Regular rate and rhythm, no murmurs, rubs, gallops RESPIRATORY:  Clear to auscultation without rales, wheezing or rhonchi  ABDOMEN: Soft, non-tender, non-distended EXTREMITIES:  No edema; No deformity    Wt Readings from Last 3 Encounters:  12/30/23 96.6 kg  11/28/23 93 kg  10/06/23 93 kg    EKG today demonstrates  SR Vent. rate 65 BPM PR interval 198 ms QRS duration 82 ms QT/QTcB 382/397 ms   Echo 01/10/22 demonstrated   1. Left ventricular ejection fraction, by estimation, is 60 to 65%. The  left ventricle has normal function. The left ventricle has no regional  wall motion abnormalities. Left ventricular diastolic parameters are  indeterminate.   2. Right ventricular systolic function is normal. The right ventricular  size is normal. There is normal pulmonary artery systolic pressure.   3. Left atrial size was mildly  dilated.   4. The mitral valve is normal in structure. Mild mitral valve  regurgitation. No evidence of mitral stenosis.   5. The aortic valve is grossly normal. Aortic valve regurgitation is not  visualized. No aortic stenosis is present.   6. The inferior vena cava is normal in size with greater than 50%  respiratory variability, suggesting right atrial pressure of 3 mmHg.   Comparison(s): No significant change from prior study.   Conclusion(s)/Recommendation(s): Otherwise normal echocardiogram, with minor abnormalities described in the report.   Epic records are reviewed at length today  CHA2DS2-VASc Score = 3  The patient's score is based upon: CHF History: 0 HTN History: 1 Diabetes History: 0 Stroke History: 0 Vascular Disease History: 0 Age Score: 1 Gender Score: 1       ASSESSMENT AND PLAN: Persistent Atrial Fibrillation/atrial flutter The patient's CHA2DS2-VASc score is 3, indicating a 3.2% annual risk of stroke.   S/p afib ablation 03/30/22 and afib and flutter ablation 11/28/23 Patient appears to be maintaining SR. Continue Eliquis  5 mg BID Continue diltiazem   240 mg daily  Secondary Hypercoagulable State (ICD10:  D68.69) The patient is at significant risk for stroke/thromboembolism based upon her CHA2DS2-VASc Score of 3.  Continue Apixaban  (Eliquis ). No bleeding issues.   Obesity Body mass index is 41.6 kg/m.  Encouraged lifestyle modification  OSA  Encouraged nightly use of oral device  HTN Stable on current regimen  Fatigue Patient reports chronic fatigue, does not appear related to afib.  Cardiac CT showed very low calcium  score (10).  Last CBC 11/13/23 did not show any abnormalities. Check TSH today. Could also be a component of deconditioning given her limited mobility with orthopedic issues. Could also consider decreasing diltiazem  at follow up, continue a present dose for now.    Follow up with Mertha Abrahams as scheduled.    Myrtha Ates PA-C Afib  [facility] [address] [place], [place] [address] [phone] 12/30/2023 10:57 AM

## 2023-12-31 ENCOUNTER — Ambulatory Visit (HOSPITAL_COMMUNITY): Payer: Self-pay | Admitting: Physician Assistant

## 2023-12-31 LAB — TSH: TSH: 2.33 u[IU]/mL (ref 0.450–4.500)

## 2024-01-02 ENCOUNTER — Ambulatory Visit: Payer: PPO | Admitting: Internal Medicine

## 2024-01-03 ENCOUNTER — Ambulatory Visit: Payer: PPO | Attending: Internal Medicine | Admitting: Internal Medicine

## 2024-01-03 ENCOUNTER — Telehealth: Payer: Self-pay | Admitting: Radiology

## 2024-01-03 VITALS — BP 154/79 | HR 65 | Ht 60.0 in | Wt 211.0 lb

## 2024-01-03 DIAGNOSIS — R5382 Chronic fatigue, unspecified: Secondary | ICD-10-CM | POA: Diagnosis not present

## 2024-01-03 DIAGNOSIS — R600 Localized edema: Secondary | ICD-10-CM

## 2024-01-03 DIAGNOSIS — G4733 Obstructive sleep apnea (adult) (pediatric): Secondary | ICD-10-CM

## 2024-01-03 MED ORDER — DILTIAZEM HCL ER COATED BEADS 120 MG PO CP24: 120.0000 mg | ORAL_CAPSULE | Freq: Every day | ORAL | 3 refills | Status: DC

## 2024-01-03 NOTE — Telephone Encounter (Signed)
 Patient agreement reviewed and signed on 01/03/2024.  WatchPAT issued to patient on 01/03/2024 by Nathalie Baize. Patient aware to not open the WatchPAT box until contacted with the activation PIN. Patient profile initialized in CloudPAT on 01/03/2024 by Jenise Mixer. Device serial number: [id number]  Please list Reason for Call as Advice Only and type WatchPAT issued to patient in the comment box.

## 2024-01-03 NOTE — Progress Notes (Signed)
 Cardiology Office Note:    Date:  01/03/2024   ID:  Eston Hence, DOB 05/18/1951, MRN [id number]  PCP:  Adelia Homestead, MD  [facility] Cardiologist:  Gloriann Larger MD [facility] Electrophysiologist:  Boyce Byes, MD   CC: Atrial Fibrillation Follow up  History of Present Illness:    Amy Williamson is a 73 y.o. female with a hx of new atrial fibrillation, with HTN, Aortic Atherosclerosis and HLD, Morbid Obesity, history of dysphagia who sees ([facility]); prior lumpectomy in 06/09/2020 who presented for evaluation 07/08/20. 2022: Had DCCV.  Saw Dr. Micael Adas for OSA. 2023: Had multiple DCCVs and started flecainide  (if needed).  AF ablation scheduled for 9/23.  Had Watchman.  Had R groin bleed but otherwise did well.  Had back surgery 2024: S/p Watchman.  New AFL.  Amiodarone  started 12/24  Amy Williamson is a 73 year old female with atrial flutter and coronary artery disease who presents with fatigue.  She has a history of atrial flutter and underwent a successful ablation on Nov 28, 2023. She is status post Watchman procedure and has been off antiarrhythmic drugs. No recent episodes of atrial fibrillation or flutter have occurred, but she continues to experience significant fatigue.  Her blood pressure typically runs around 130/80 mmHg. No recent palpitations or chest pain, though she mentions occasional tightness, which has not occurred recently.  She has a history of coronary artery complications and aortic atherosclerosis. 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  Significant fatigue persists despite normal thyroid  function tests. She has a history of two back surgeries and pneumonia in March 2025, which delayed her ablation procedure.  She has obstructive sleep apnea managed with a mouth guard, as she could not tolerate CPAP due to recurrent upper respiratory infections. She reports poor sleep quality, waking  frequently throughout the night, and feeling exhausted upon waking.   Past Medical History:  Diagnosis Date   Anticoagulant long-term use    eliquis  --- managed by cardiology   Arthritis    BACK AND SHOULDERS   Depressive disorder, not elsewhere classified    Essential hypertension    followed by pcp and cardiology   Frequency of urination    GERD (gastroesophageal reflux disease)    Headache    migraines when younger   History of dysphagia 2015   s/p egd w/ dilation, pt did not have stricture   History of gastritis    OSA (obstructive sleep apnea)    followed by dr t. turner---  study in epic 09-03-2020 moderate osa ,  pt scheduled for cpap titration 12-08-2020- pt wears oral device, cannot wear CPAP   PAF (paroxysmal atrial fibrillation) [facility])    cardiologist-- dr Junior Olea---- new onset 06-30-2020 at pcp office visit,  s/p DCCV 07-28-2020 successful   Palpitations 06/2020   11-16-2020  per pt denies palpitations since DCCV 07-28-2020   Plantar fasciitis 08/2017   left foot    Pneumonia    PONV (postoperative nausea and vomiting)    nausea after back surgery, no issues after ablation   Presence of Watchman left atrial appendage closure device 06/28/2022   Watchman FLX 27mm with Dr. Marven Slimmer   Pure hypercholesterolemia     Past Surgical History:  Procedure Laterality Date   ATRIAL FIBRILLATION ABLATION N/A 03/30/2022   Procedure: ATRIAL FIBRILLATION ABLATION;  Surgeon: Boyce Byes, MD;  Location: [facility] INVASIVE CV LAB;  Service: Cardiovascular;  Laterality: N/A;   ATRIAL FIBRILLATION ABLATION N/A 11/28/2023   Procedure: ATRIAL FIBRILLATION ABLATION;  Surgeon: Boyce Byes, MD;  Location: [facility] INVASIVE CV LAB;  Service: Cardiovascular;  Laterality: N/A;   BREAST LUMPECTOMY WITH RADIOACTIVE SEED LOCALIZATION Left 06/09/2020   Procedure: LEFT BREAST LUMPECTOMY WITH RADIOACTIVE SEED LOCALIZATION;  Surgeon: Oza Blumenthal, MD;  Location: [facility];   Service: General;  Laterality: Left;  LMA   CARDIOVERSION N/A 07/28/2020   Procedure: CARDIOVERSION;  Surgeon: Jann Melody, MD;  Location: [facility] ENDOSCOPY;  Service: Cardiovascular;  Laterality: N/A;   CARDIOVERSION N/A 07/11/2023   Procedure: CARDIOVERSION;  Surgeon: Hugh Madura, MD;  Location: [facility] INVASIVE CV LAB;  Service: Cardiovascular;  Laterality: N/A;   CARPAL TUNNEL RELEASE Bilateral right 2004;  left 2006   CATARACT EXTRACTION W/ INTRAOCULAR LENS  IMPLANT, BILATERAL  2016   CESAREAN SECTION  [id number]   BILATERAL TUBAL LIGATION W/ LAST C/S   COLONOSCOPY WITH PROPOFOL   last one 08/ 2013   DILATATION & CURETTAGE/HYSTEROSCOPY WITH MYOSURE N/A 11/22/2020   Procedure: DILATATION & CURETTAGE/HYSTEROSCOPY WITH MYOSURE;  Surgeon: Lillian Rein, MD;  Location: [facility];  Service: Gynecology;  Laterality: N/A;   EAR CYST EXCISION  08/03/2011   patient states this is incorrect was a Baker's cyst popliteal area   ESOPHAGOGASTRODUODENOSCOPY (EGD) WITH ESOPHAGEAL DILATION  08/2013   KNEE ARTHROSCOPY W/ MENISCAL REPAIR Left 08-03-2011  @[facility]    and excision baker's cyst   LEFT ATRIAL APPENDAGE OCCLUSION N/A 06/28/2022   Procedure: LEFT ATRIAL APPENDAGE OCCLUSION;  Surgeon: Boyce Byes, MD;  Location: [facility] INVASIVE CV LAB;  Service: Cardiovascular;  Laterality: N/A;   LUMBAR FUSION  02/19/2022   posterior- lumbar 5-6   PULLEY RELEASE RIGHT TRIGGER FINGER Right 01/08/2011   TEE WITHOUT CARDIOVERSION N/A 06/28/2022   Procedure: TRANSESOPHAGEAL ECHOCARDIOGRAM (TEE);  Surgeon: Boyce Byes, MD;  Location: [facility] INVASIVE CV LAB;  Service: Cardiovascular;  Laterality: N/A;   TRIGGER FINGER RELEASE Bilateral left 05/06/14;  right  02-29-2014    Current Medications: Current Meds  Medication Sig   diltiazem  (CARDIZEM  CD) 120 MG 24 hr capsule Take 1 capsule (120 mg total) by mouth daily.     Allergies:   Tramadol , Atenolol , Avelox [moxifloxacin hcl in nacl],  Azithromycin , Doxycycline , Moxifloxacin hcl, Myrbetriq  [mirabegron ], Naproxen, and Oxybutynin    Social History   Socioeconomic History   Marital status: Married    Spouse name: Josefina Nian   Number of children: 2   Years of education: Not on file   Highest education level: Not on file  Occupational History   Occupation: retired  Tobacco Use   Smoking status: Never   Smokeless tobacco: Never   Tobacco comments:    Never smoke 10/11/21  Vaping Use   Vaping status: Never Used  Substance and Sexual Activity   Alcohol use: No   Drug use: Never   Sexual activity: Not Currently    Birth control/protection: Surgical, Post-menopausal    Comment: BTL  Other Topics Concern   Not on file  Social History Narrative   Not on file   Social Drivers of Health   Financial Resource Strain: Low Risk  (06/26/2023)   Overall Financial Resource Strain (CARDIA)    Difficulty of Paying Living Expenses: Not hard at all  Food Insecurity: No Food Insecurity (06/26/2023)   Hunger Vital Sign    Worried About Running Out of Food in the Last Year: Never true    Ran Out of  Food in the Last Year: Never true  Transportation Needs: No Transportation Needs (06/26/2023)   PRAPARE - Administrator, Civil Service (Medical): No    Lack of Transportation (Non-Medical): No  Physical Activity: Insufficiently Active (06/26/2023)   Exercise Vital Sign    Days of Exercise per Week: 2 days    Minutes of Exercise per Session: 30 min  Stress: No Stress Concern Present (06/26/2023)   [employer] of Occupational Health - Occupational Stress Questionnaire    Feeling of Stress : Not at all  Social Connections: Socially Integrated (06/26/2023)   Social Connection and Isolation Panel    Frequency of Communication with Friends and Family: Three times a week    Frequency of Social Gatherings with Friends and Family: Three times a week    Attends Religious Services: More than 4 times per year    Active Member of  Clubs or Organizations: Yes    Attends Engineer, structural: More than 4 times per year    Marital Status: Married    Social:  I take care of her nephew, her husband comes to some visits.  Family History: The patient's family history includes Clotting disorder in her father; Heart disease in her father; Heart failure in her mother; Hyperlipidemia in her mother; Hypertension in her mother; Liver cancer in her brother; Liver disease in her maternal aunt; Osteoporosis in her mother. There is no history of Colon cancer. History of coronary artery disease notable for no members. History of heart failure notable for mother. No history of cardiomyopathies including hypertrophic cardiomyopathy, left ventricular non-compaction, or arrhythmogenic right ventricular cardiomyopathy. History of arrhythmia notable for AF in mother. Denies family history of sudden cardiac death including drowning, car accidents, or unexplained deaths in the family. No history of bicuspid aortic valve or aortic aneurysm or dissection.  ROS:   Please see the history of present illness.     EKGs/Labs/Other Studies Reviewed:    The following studies were reviewed today:  EKG:  02/09/22: SR 1st HB LVH 09/13/2020: SR rate 61 WNL 06/30/20:  A fib 70   Cardiac Studies & Procedures   ______________________________________________________________________________________________   STRESS TESTS  MYOCARDIAL PERFUSION IMAGING 10/19/2021  Narrative   The study is normal. The study is low risk.   No ST deviation was noted.   LV perfusion is normal.   Left ventricular function is normal. Nuclear stress EF: 69 %. The left ventricular ejection fraction is hyperdynamic (>65%). End diastolic cavity size is normal.   Prior study not available for comparison.   ECHOCARDIOGRAM  ECHOCARDIOGRAM COMPLETE 01/10/2022  Narrative ECHOCARDIOGRAM REPORT    Patient Name:   SHREYA LACASSE Date of Exam: 01/10/2022 Medical Rec  #:  [id number]        Height:       60.0 in Accession #:    [id number]       Weight:       216.0 lb Date of Birth:  [birth year]         BSA:          1.929 m Patient Age:    71 years         BP:           140/74 mmHg Patient Gender: F                HR:           70 bpm. Exam Location:  [address]  Procedure: 2D Echo,  Cardiac Doppler and Color Doppler  Indications:    I48.19 Atrial fibrillation  History:        Patient has prior history of Echocardiogram examinations, most recent 08/10/2020. Arrythmias:Atrial Fibrillation; Risk Factors:Morbid obesity and Hypertension.  Sonographer:    Lula Sale RDCS Referring Phys: [id number] Leanora Prophet LAMBERT  IMPRESSIONS   1. Left ventricular ejection fraction, by estimation, is 60 to 65%. The left ventricle has normal function. The left ventricle has no regional wall motion abnormalities. Left ventricular diastolic parameters are indeterminate. 2. Right ventricular systolic function is normal. The right ventricular size is normal. There is normal pulmonary artery systolic pressure. 3. Left atrial size was mildly dilated. 4. The mitral valve is normal in structure. Mild mitral valve regurgitation. No evidence of mitral stenosis. 5. The aortic valve is grossly normal. Aortic valve regurgitation is not visualized. No aortic stenosis is present. 6. The inferior vena cava is normal in size with greater than 50% respiratory variability, suggesting right atrial pressure of 3 mmHg.  Comparison(s): No significant change from prior study.  Conclusion(s)/Recommendation(s): Otherwise normal echocardiogram, with minor abnormalities described in the report.  FINDINGS Left Ventricle: Left ventricular ejection fraction, by estimation, is 60 to 65%. The left ventricle has normal function. The left ventricle has no regional wall motion abnormalities. The left ventricular internal cavity size was normal in size. There is no left ventricular hypertrophy. Left  ventricular diastolic parameters are indeterminate.  Right Ventricle: The right ventricular size is normal. No increase in right ventricular wall thickness. Right ventricular systolic function is normal. There is normal pulmonary artery systolic pressure. The tricuspid regurgitant velocity is 2.61 m/s, and with an assumed right atrial pressure of 3 mmHg, the estimated right ventricular systolic pressure is 30.2 mmHg.  Left Atrium: Left atrial size was mildly dilated.  Right Atrium: Right atrial size was normal in size.  Pericardium: Trivial pericardial effusion is present.  Mitral Valve: The mitral valve is normal in structure. There is mild thickening of the mitral valve leaflet(s). There is mild calcification of the mitral valve leaflet(s). Mild mitral valve regurgitation, with eccentric laterally directed jet. No evidence of mitral valve stenosis.  Tricuspid Valve: The tricuspid valve is normal in structure. Tricuspid valve regurgitation is mild . No evidence of tricuspid stenosis.  Aortic Valve: The aortic valve is grossly normal. Aortic valve regurgitation is not visualized. No aortic stenosis is present.  Pulmonic Valve: The pulmonic valve was not well visualized. Pulmonic valve regurgitation is trivial. No evidence of pulmonic stenosis.  Aorta: The aortic root, ascending aorta, aortic arch and descending aorta are all structurally normal, with no evidence of dilitation or obstruction.  Venous: The inferior vena cava is normal in size with greater than 50% respiratory variability, suggesting right atrial pressure of 3 mmHg.  IAS/Shunts: The atrial septum is grossly normal.   LEFT VENTRICLE PLAX 2D LVIDd:         4.70 cm   Diastology LVIDs:         3.00 cm   LV e' medial:    11.70 cm/s LV PW:         1.10 cm   LV E/e' medial:  12.6 LV IVS:        0.90 cm   LV e' lateral:   5.55 cm/s LVOT diam:     1.90 cm   LV E/e' lateral: 26.7 LV SV:         75 LV SV Index:   39 LVOT Area:  2.84 cm   RIGHT VENTRICLE             IVC RV S prime:     14.80 cm/s  IVC diam: 1.70 cm TAPSE (M-mode): 2.9 cm RVSP:           30.2 mmHg  LEFT ATRIUM             Index        RIGHT ATRIUM           Index LA diam:        4.10 cm 2.13 cm/m   RA Pressure: 3.00 mmHg LA Vol (A2C):   51.8 ml 26.86 ml/m  RA Area:     11.20 cm LA Vol (A4C):   63.8 ml 33.08 ml/m  RA Volume:   20.50 ml  10.63 ml/m LA Biplane Vol: 59.2 ml 30.69 ml/m AORTIC VALVE LVOT Vmax:   111.00 cm/s LVOT Vmean:  73.700 cm/s LVOT VTI:    0.265 m  AORTA Ao Root diam: 3.10 cm Ao Asc diam:  3.40 cm  MITRAL VALVE                TRICUSPID VALVE MV Area (PHT): 3.23 cm     TR Peak grad:   27.2 mmHg MV Decel Time: 235 msec     TR Vmax:        261.00 cm/s MV E velocity: 148.00 cm/s  Estimated RAP:  3.00 mmHg MV A velocity: 136.00 cm/s  RVSP:           30.2 mmHg MV E/A ratio:  1.09 SHUNTS Systemic VTI:  0.26 m Systemic Diam: 1.90 cm  Sheryle Donning MD Electronically signed by Sheryle Donning MD Signature Date/Time: 01/10/2022/2:39:07 PM    Final   TEE  ECHO TEE 06/28/2022  Narrative TRANSESOPHOGEAL ECHO REPORT    Patient Name:   VERL WHITMORE Date of Exam: 06/28/2022 Medical Rec #:  [id number]        Height:       60.0 in Accession #:    [id number]       Weight:       200.0 lb Date of Birth:  1950-12-14         BSA:          1.867 m Patient Age:    71 years         BP:           136/51 mmHg Patient Gender: F                HR:           76 bpm. Exam Location:  Inpatient  Procedure: Transesophageal Echo, Cardiac Doppler, Color Doppler and 3D Echo  Indications:     Persistent atrial fibrillation (HCC) [W29.56 (ICD-10-CM)]  History:         Patient has prior history of Echocardiogram examinations, most recent 01/10/2022. Risk Factors:Hypertension, Sleep Apnea and Dyslipidemia. 27mm Watchman implanted on 06/28/2022. GERD.  Sonographer:     Konnie Perry RDCS Referring Phys:  [id number]  Boyce Byes Diagnosing Phys: Gloriann Larger MD  PROCEDURE: After discussion of the risks and benefits of a TEE, an informed consent was obtained from the patient. The patient was intubated. TEE procedure time was 39 minutes. The transesophogeal probe was passed without difficulty through the esophogus of the patient. Imaged were obtained with the patient in a supine position. Sedation performed by different physician. The patient was monitored while under deep sedation.  Anesthestetic sedation was provided intravenously by Anesthesiology: 130mg  of Propofol , 80mg  of Lidocaine . Supplementary images were obtained from transthoracic windows as indicated to answer the clinical question. The patient developed no complications during the procedure.  IMPRESSIONS   1. Interventional TEE for Watchman FLX procedure. 2. Prior to procedure, there was a patent left atrial appendage with a windsock morphology. Maximal diameter 20 cm (ostial deployment) with suitable depth for device. 3. Mid- mid stick with no complications. 4. Placement of 27 mm Watchman FLX device. No leak. No Mitral shoulder. Average compression ~ 23%. 5. Left ventricular ejection fraction, by estimation, is 60 to 65%. The left ventricle has normal function. 6. Right ventricular systolic function is normal. The right ventricular size is normal. 7. Left atrial size was mildly dilated. No left atrial/left atrial appendage thrombus was detected. 8. The mitral valve is normal in structure. Mild mitral valve regurgitation. 9. The aortic valve is normal in structure. Aortic valve regurgitation is not visualized. No aortic stenosis is present. 10. Evidence of atrial level shunting detected by color flow Doppler after transeptal puncture, with all left to right flow. 11. Trivial apical pericardial effusion is unchanged from prior.  FINDINGS Left Ventricle: Left ventricular ejection fraction, by estimation, is 60 to 65%. The left  ventricle has normal function. The left ventricular internal cavity size was normal in size.  Right Ventricle: The right ventricular size is normal. No increase in right ventricular wall thickness. Right ventricular systolic function is normal.  Left Atrium: Left atrial size was mildly dilated. No left atrial/left atrial appendage thrombus was detected.  Right Atrium: Right atrial size was normal in size.  Pericardium: Trivial pericardial effusion is present. The pericardial effusion is circumferential and surrounding the apex.  Mitral Valve: The mitral valve is normal in structure. Mild mitral valve regurgitation.  Tricuspid Valve: The tricuspid valve is normal in structure. Tricuspid valve regurgitation is mild . No evidence of tricuspid stenosis.  Aortic Valve: The aortic valve is normal in structure. Aortic valve regurgitation is not visualized. No aortic stenosis is present.  Pulmonic Valve: The pulmonic valve was normal in structure. Pulmonic valve regurgitation is trivial. No evidence of pulmonic stenosis.  Aorta: The aortic root is normal in size and structure.  IAS/Shunts: Evidence of atrial level shunting detected by color flow Doppler.   TRICUSPID VALVE TR Peak grad:   24.2 mmHg TR Vmax:        246.00 cm/s  Gloriann Larger MD Electronically signed by Gloriann Larger MD Signature Date/Time: 06/28/2022/11:03:59 AM    Final        ______________________________________________________________________________________________       Recent Labs: 07/04/2023: ALT 15; Magnesium 2.5 11/13/2023: BUN 17; Creatinine, Ser 0.80; Hemoglobin 12.6; Platelets 273; Potassium 5.0; Sodium 140 12/30/2023: TSH 2.330  Recent Lipid Panel    Component Value Date/Time   CHOL 208 (H) 11/13/2023 0835   TRIG 101 11/13/2023 0835   TRIG 54 06/25/2006 0725   HDL 68 11/13/2023 0835   CHOLHDL 3.1 11/13/2023 0835   CHOLHDL 4 07/09/2023 0920   VLDL 27.4 07/09/2023 0920   LDLCALC  122 (H) 11/13/2023 0835   LDLDIRECT 142.3 03/16/2013 0732      Physical Exam:    VS:  BP (!) 154/79 (BP Location: Right Arm)   Pulse 65   Ht 5' (1.524 m)   Wt 211 lb (95.7 kg)   LMP 07/23/2004   SpO2 97%   BMI 41.21 kg/m     Wt Readings from Last 3 Encounters:  01/03/24 211 lb (95.7 kg)  12/30/23 213 lb (96.6 kg)  11/28/23 205 lb (93 kg)    Gen: no distress, morbid obesity   Neck: No JVD Cardiac: No Rubs or Gallops, no murmur, RRR +2 radial pulses Respiratory: Clear to auscultation bilaterally, normal effort, normal  respiratory rate GI: Soft, nontender, non-distended  MS: No edema ;  moves all extremities Neuro:  At time of evaluation, alert and oriented to person/place/time/situation  Psych: Normal affect, patient feels OK   ASSESSMENT:    1. Chronic fatigue   2. OSA (obstructive sleep apnea)   3. Bilateral lower extremity edema     PLAN:    Atrial flutter Status post successful ablation. Currently in rhythm with no palpitations or chest pain. Fatigue persists, but atrial flutter is not suspected as the cause. Anticipated cessation of anticoagulation due to Watchman device. - Confirm with Dr. Marven Slimmer regarding cessation of anticoagulation, expected in August.  Coronary artery disease Statin myopathy - Statin myopathy with no current statin use. LDL was elevated in April 1610. Discussed lifestyle interventions for cholesterol management. - Discuss lifestyle interventions for cholesterol management. - Coordinate with Kristen regarding cholesterol management. - No recent chest pain or tightness. Previous testing in 2021 showed no significant issues. Current symptoms not suggestive of coronary artery disease. - has f/u with Burleigh Carp later this week- if BP goes up add diltiazem  back; GLP-1 therapy would improved LDL as well  Hypertension Blood pressure slightly elevated today, but typically around 130/80 mmHg. Current elevation may be an aberration. Potential fatigue  related to diltiazem  use. Plan to reduce diltiazem  to assess impact on fatigue and blood pressure. - Reduce diltiazem  dose from 240 mg to 120 mg. - Monitor blood pressure and fatigue symptoms. - Discuss potential cessation of diltiazem  with Kristen if blood pressure remains stable and fatigue improves.  Mitral valve regurgitation Mild mitral valve regurgitation. - Order echocardiogram to assess mitral valve regurgitation status.  Obstructive sleep apnea Fatigue may be related to suboptimally treated obstructive sleep apnea. Currently using a mouth guard due to CPAP intolerance. Potential candidate for Inspire device or GLP-1 medication if apnea hypopnea index indicates moderate obstructive sleep apnea. Discussed potential benefits and alternatives of Inspire device and GLP-1 medications. - Recommend repeat home sleep study to assess current apnea hypopnea index. - Discuss potential referral to Dr. Micael Adas for [facility] device if sleep apnea remains significant. - Consider GLP-1 medication if obstructive sleep apnea is moderate and other treatments have failed.  Pneumonia Recent pneumonia in March 2025, which delayed ablation procedure. Deconditioning may contribute to fatigue. - Encourage increased physical activity to improve conditioning post-pneumonia.  Six month  Longitudinal care: The evaluation and management services provided today reflect the complexity inherent in caring for this patient, including the ongoing longitudinal relationship and management of multiple chronic conditions and/or the need for care coordination. The visit required a comprehensive assessment and management plan tailored to the patient's unique needs Time was spent addressing not only the acute concerns but also the broader context of the patient's health, including preventive care, chronic disease management, and care coordination as appropriate.  Complex longitudinal is necessary for conditions including:  aggressive secondary prevention of cardiac disease    Medication Adjustments/Labs and Tests Ordered: Current medicines are reviewed at length with the patient today.  Concerns regarding medicines are outlined above.  Orders Placed This Encounter  Procedures   ECHOCARDIOGRAM COMPLETE   [place] Sleep Study   Meds ordered this encounter  Medications   diltiazem  (  CARDIZEM  CD) 120 MG 24 hr capsule    Sig: Take 1 capsule (120 mg total) by mouth daily.    Dispense:  90 capsule    Refill:  3    Dose decrease    Patient Instructions  Medication Instructions:  Your physician has recommended you make the following change in your medication:  1.) decrease diltiazem  to 120 mg - once a day  *If you need a refill on your cardiac medications before your next appointment, please call your pharmacy*  Lab Work: none   Testing/Procedures: Your physician has requested that you have an echocardiogram. Echocardiography is a painless test that uses sound waves to create images of your heart. It provides your doctor with information about the size and shape of your heart and how well your heart's chambers and valves are working. This procedure takes approximately one hour. There are no restrictions for this procedure. Please do NOT wear cologne, perfume, aftershave, or lotions (deodorant is allowed). Please arrive 15 minutes prior to your appointment time.  Please note: We ask at that you not bring children with you during ultrasound (echo/ vascular) testing. Due to room size and safety concerns, children are not allowed in the ultrasound rooms during exams. Our front office staff cannot provide observation of children in our lobby area while testing is being conducted. An adult accompanying a patient to their appointment will only be allowed in the ultrasound room at the discretion of the ultrasound technician under special circumstances. We apologize for any inconvenience.  Your physician has  recommended that you have a sleep study. This test records several body functions during sleep, including: brain activity, eye movement, oxygen and carbon dioxide blood levels, heart rate and rhythm, breathing rate and rhythm, the flow of air through your mouth and nose, snoring, body muscle movements, and chest and belly movement.   Follow-Up: At [facility], you and your health needs are our priority.  As part of our continuing mission to provide you with exceptional heart care, our providers are all part of one team.  This team includes your primary Cardiologist (physician) and Advanced Practice Providers or APPs (Physician Assistants and Nurse Practitioners) who all work together to provide you with the care you need, when you need it.  Your next appointment:   7 month(s)  Provider:   Gloriann Larger, MD     Gloriann Larger, MD FASE [facility] Cardiologist [facility]  [address] [place], [place] [address] [phone]  11:30 AM

## 2024-01-03 NOTE — Patient Instructions (Addendum)
 Medication Instructions:  Your physician has recommended you make the following change in your medication:  1.) decrease diltiazem  to 120 mg - once a day  *If you need a refill on your cardiac medications before your next appointment, please call your pharmacy*  Lab Work: none   Testing/Procedures: Your physician has requested that you have an echocardiogram. Echocardiography is a painless test that uses sound waves to create images of your heart. It provides your doctor with information about the size and shape of your heart and how well your heart's chambers and valves are working. This procedure takes approximately one hour. There are no restrictions for this procedure. Please do NOT wear cologne, perfume, aftershave, or lotions (deodorant is allowed). Please arrive 15 minutes prior to your appointment time.  Please note: We ask at that you not bring children with you during ultrasound (echo/ vascular) testing. Due to room size and safety concerns, children are not allowed in the ultrasound rooms during exams. Our front office staff cannot provide observation of children in our lobby area while testing is being conducted. An adult accompanying a patient to their appointment will only be allowed in the ultrasound room at the discretion of the ultrasound technician under special circumstances. We apologize for any inconvenience.  Your physician has recommended that you have a sleep study. This test records several body functions during sleep, including: brain activity, eye movement, oxygen and carbon dioxide blood levels, heart rate and rhythm, breathing rate and rhythm, the flow of air through your mouth and nose, snoring, body muscle movements, and chest and belly movement.   Follow-Up: At [facility], you and your health needs are our priority.  As part of our continuing mission to provide you with exceptional heart care, our providers are all part of one team.  This team includes  your primary Cardiologist (physician) and Advanced Practice Providers or APPs (Physician Assistants and Nurse Practitioners) who all work together to provide you with the care you need, when you need it.  Your next appointment:   7 month(s)  Provider:   Gloriann Larger, MD

## 2024-01-07 ENCOUNTER — Telehealth: Payer: Self-pay | Admitting: Internal Medicine

## 2024-01-07 ENCOUNTER — Ambulatory Visit: Admitting: Pulmonary Disease

## 2024-01-07 NOTE — Telephone Encounter (Signed)
**Note De-Identified Malana Eberwein Obfuscation** Ordering provider: Dr Paulita Boss Associated diagnoses: OSA-R06.83 and Fatigue-R53.83  WatchPAT PA obtained on 01/07/2024 by Westyn Driggers, Isabella Mao, LPN. Authorization: Per the [employer] provider portal: Approved Valid dates: 01/07/2024 - 04/06/2024 Outpatient Authorization #[id number]  Patient notified of PIN (1234) on 01/07/2024 Roverto Bodmer Notification Method: phone. Pt states that she is not interested in the [place] device because she read the side effects of difficulty breathing and insomnia but that she does want to pursue GLP-1 medication. She also asked me to pass this information on to Dr Paulita Boss: Her AHI was 22.1 on 12/08/2020 per [facility] Sleep Study and was 13 on her mouth guard per sleep study with Dr Abel Abelson on 07/25/2022.  Phone note routed to covering staff for follow-up.

## 2024-01-07 NOTE — Telephone Encounter (Signed)
 Error

## 2024-01-07 NOTE — Telephone Encounter (Signed)
 Patient is calling to for the activation pin. Please advise.

## 2024-01-09 ENCOUNTER — Encounter (INDEPENDENT_AMBULATORY_CARE_PROVIDER_SITE_OTHER): Payer: Self-pay | Admitting: Cardiology

## 2024-01-09 DIAGNOSIS — G4733 Obstructive sleep apnea (adult) (pediatric): Secondary | ICD-10-CM

## 2024-01-10 ENCOUNTER — Ambulatory Visit: Attending: Cardiology | Admitting: Pharmacist Clinician (PhC)/ Clinical Pharmacy Specialist

## 2024-01-10 ENCOUNTER — Telehealth: Payer: Self-pay | Admitting: Pharmacist Clinician (PhC)/ Clinical Pharmacy Specialist

## 2024-01-10 ENCOUNTER — Telehealth: Payer: Self-pay

## 2024-01-10 ENCOUNTER — Other Ambulatory Visit (HOSPITAL_COMMUNITY): Payer: Self-pay

## 2024-01-10 ENCOUNTER — Encounter: Payer: Self-pay | Admitting: Pharmacist Clinician (PhC)/ Clinical Pharmacy Specialist

## 2024-01-10 DIAGNOSIS — Z131 Encounter for screening for diabetes mellitus: Secondary | ICD-10-CM

## 2024-01-10 DIAGNOSIS — E782 Mixed hyperlipidemia: Secondary | ICD-10-CM

## 2024-01-10 DIAGNOSIS — I10 Essential (primary) hypertension: Secondary | ICD-10-CM

## 2024-01-10 MED ORDER — REPATHA SURECLICK 140 MG/ML ~~LOC~~ SOAJ: 140.0000 mg | SUBCUTANEOUS | 3 refills | Status: AC

## 2024-01-10 NOTE — Addendum Note (Signed)
 Addended by: Regina Coppolino L on: 01/10/2024 03:57 PM   Modules accepted: Orders

## 2024-01-10 NOTE — Telephone Encounter (Signed)
 PA request has been Submitted. New Encounter has been or will be created for follow up. For additional info see Pharmacy Prior Auth telephone encounter from 01/10/24.

## 2024-01-10 NOTE — Telephone Encounter (Signed)
 Please do PA for Repatha

## 2024-01-10 NOTE — Progress Notes (Signed)
 Office Visit    Patient Name: Amy Williamson Date of Encounter: 01/10/2024  Primary Care Provider:  Adelia Homestead, MD Primary Cardiologist:  None  Chief Complaint    Hyperlipidemia, hypertension  Significant Past Medical History   CAD Aortic atherosclerosis  AF 11/28/23 ablation, s/p Watchman  HTN Elevated at last visit, better at home, decreased dilt from 240 to 120 mg  OSA Did not tolerate CPAP, uses mouth guard  obesity BMI 41.21  preDM 12/24 A1c 6     Allergies  Allergen Reactions   Tramadol  Itching and Rash    May have been shingles instead of allergy per patient   Atenolol      Decreased blood pressure   Avelox [Moxifloxacin Hcl In Nacl]     Pt did not like side effects   Azithromycin      ?causes muscle weakness, dysphagia   Doxycycline  Nausea Only    And heartburn   Moxifloxacin Hcl    Myrbetriq  [Mirabegron ]     Increased blood pressure   Naproxen     Can not take while on Eliquis    Oxybutynin  Palpitations    Per pt made her have rapid heart beat    History of Present Illness    Amy Williamson is a 73 y.o. female patient of Dr Paulita Boss, in the office today to discuss options for cholesterol management.  She is also asking about potential medications for obesity and sleep apnea.  Did a home sleep study last night.  When she saw Dr. Paulita Boss he also decreased her diltiazem  from 240 to 120 mg daily, to see if this would help decrease her fatigue.  Insurance Carrier:  [employer]   No deductible, $47/month  Pharmacy:   [facility] [name]  Healthwell:  no  LDL Cholesterol goal:  LDL < 70  Previously tried:  simvastatin , rosuvastatin   Family Hx:  mother had MI, AF, died HF broken hip, dad with elevated cholesterol; oldest brother cancer; sister heealthy, younger sister hypertensive emergency; 2 sons, older lost weight and improved, younger healthy   Social Hx: Tobacco: no Alcohol: no     Diet:  ablation May 8, didn't feel  like cooiking mouch around that, no fried foods, lots of salmon, grilled chikcen, variety of vegetables; snakcs are mostly fruit   Exercise: not since ablation  Accessory Clinical Findings   Lab Results  Component Value Date   CHOL 208 (H) 11/13/2023   HDL 68 11/13/2023   LDLCALC 122 (H) 11/13/2023   LDLDIRECT 142.3 03/16/2013   TRIG 101 11/13/2023   CHOLHDL 3.1 11/13/2023    No results found for: LIPOA  Lab Results  Component Value Date   ALT 15 07/04/2023   AST 18 07/04/2023   ALKPHOS 79 07/04/2023   BILITOT 0.6 07/04/2023   Lab Results  Component Value Date   CREATININE 0.80 11/13/2023   BUN 17 11/13/2023   NA 140 11/13/2023   K 5.0 11/13/2023   CL 101 11/13/2023   CO2 26 11/13/2023   Lab Results  Component Value Date   HGBA1C 6.0 07/09/2023    Home Medications    Current Outpatient Medications  Medication Sig Dispense Refill   acetaminophen  (TYLENOL ) 650 MG CR tablet Take 1,300 mg by mouth in the morning and at bedtime.     apixaban  (ELIQUIS ) 5 MG TABS tablet Take 1 tablet (5 mg total) by mouth 2 (two) times daily. 180 tablet 1   benzonatate  (TESSALON ) 100 MG capsule Take 1 capsule (  100 mg total) by mouth every 8 (eight) hours. 21 capsule 0   bisacodyl  (DULCOLAX) 5 MG EC tablet Take 5 mg by mouth daily as needed for moderate constipation.     Cholecalciferol (VITAMIN D3 PO) Take 1,000 Units by mouth in the morning.     Cyanocobalamin  (B-12 PO) Take 1,000 mcg by mouth in the morning.     diltiazem  (CARDIZEM  CD) 120 MG 24 hr capsule Take 1 capsule (120 mg total) by mouth daily. 90 capsule 3   furosemide  (LASIX ) 20 MG tablet Take 1 tablet (20 mg total) by mouth daily as needed. 30 tablet 11   pantoprazole  (PROTONIX ) 40 MG tablet Take 1 tablet (40 mg total) by mouth daily. 100 tablet 3   No current facility-administered medications for this visit.     Assessment & Plan    Hyperlipidemia Assessment: Patient with ASCVD not at LDL goal of < 70 Most recent LDL  122 on 11/20/23 Not able to tolerate statins secondary to myalgias - simvastatin , rosuvastatin   Reviewed options for lowering LDL cholesterol, including ezetimibe , PCSK-9 inhibitors, bempedoic acid and inclisiran.  Discussed mechanisms of action, dosing, side effects, potential decreases in LDL cholesterol and costs.  Also reviewed potential options for patient assistance.  Plan: Patient agreeable to starting Repatha 140 mg q14d Repeat labs after:  3 months Lipid Liver function  Morbid obesity (HCC) Patient with obesity, interested in GLP for weight management.  Reviewed information about medication (Zepbound) regarding mechanism, side effects, dosing, administration.   She had sleep study done today, depending on results, will try to get approval for Zepbound.  Patient to let me know when results available.   Essential hypertension Patient reports home BP readings unchanged since last visit.  Gave log and asked that she monitor home readings 2-3 times per week. Will reach out in a month to see if any changes are needed to medications.  (Diltiazem  was cut from 240 mg daily to 120 mg)   Donivan Furry, PharmD CPP [facility] [address]   [place], [place] [address] [phone]  01/10/2024, 12:09 PM

## 2024-01-10 NOTE — Patient Instructions (Signed)
 Your Results:             Your most recent labs Goal  Total Cholesterol 208 < 200  Triglycerides 101 < 150  HDL (happy/good cholesterol) 68 > 40  LDL (lousy/bad cholesterol 122 < 55   Medication changes:  We will start the process to get Repatha covered by your insurance.  Once the prior authorization is complete, I will call/send a MyChart message to let you know and confirm pharmacy information.   You will take one injection every 14 days  If your sleep study shows sleep apnea, we can see if your insurance will cover Zepbound  Lab orders:  We want to repeat labs after 2-3 months.  We will send you a lab order to remind you once we get closer to that time.     Thank you for choosing [facility]

## 2024-01-10 NOTE — Assessment & Plan Note (Signed)
 Patient reports home BP readings unchanged since last visit.  Gave log and asked that she monitor home readings 2-3 times per week. Will reach out in a month to see if any changes are needed to medications.  (Diltiazem  was cut from 240 mg daily to 120 mg)

## 2024-01-10 NOTE — Assessment & Plan Note (Signed)
 Assessment: Patient with ASCVD not at LDL goal of < 70 Most recent LDL 122 on 11/20/23 Not able to tolerate statins secondary to myalgias - simvastatin , rosuvastatin   Reviewed options for lowering LDL cholesterol, including ezetimibe , PCSK-9 inhibitors, bempedoic acid and inclisiran.  Discussed mechanisms of action, dosing, side effects, potential decreases in LDL cholesterol and costs.  Also reviewed potential options for patient assistance.  Plan: Patient agreeable to starting Repatha 140 mg q14d Repeat labs after:  3 months Lipid Liver function

## 2024-01-10 NOTE — Assessment & Plan Note (Signed)
 Patient with obesity, interested in GLP for weight management.  Reviewed information about medication (Zepbound) regarding mechanism, side effects, dosing, administration.   She had sleep study done today, depending on results, will try to get approval for Zepbound.  Patient to let me know when results available.

## 2024-01-10 NOTE — Telephone Encounter (Signed)
 Pharmacy Patient Advocate Encounter   Received notification from Physician's Office that prior authorization for REPATHA is required/requested.   Insurance verification completed.   The patient is insured through [facility] ADVANTAGE/RX ADVANCE .   Per test claim: PA required; PA submitted to above mentioned insurance via CoverMyMeds Key/confirmation #/EOC [id number] Status is pending

## 2024-01-10 NOTE — Telephone Encounter (Signed)
 Pharmacy Patient Advocate Encounter  Received notification from [facility] ADVANTAGE/RX ADVANCE that Prior Authorization for REPATHA has been APPROVED from 01/10/24 to 07/08/24. Ran test claim, Copay is $47. This test claim was processed through [facility]- copay amounts may vary at other pharmacies due to pharmacy/plan contracts, or as the patient moves through the different stages of their insurance plan.

## 2024-01-14 ENCOUNTER — Ambulatory Visit: Attending: Internal Medicine

## 2024-01-14 DIAGNOSIS — G4733 Obstructive sleep apnea (adult) (pediatric): Secondary | ICD-10-CM

## 2024-01-14 DIAGNOSIS — R600 Localized edema: Secondary | ICD-10-CM

## 2024-01-14 DIAGNOSIS — R5382 Chronic fatigue, unspecified: Secondary | ICD-10-CM

## 2024-01-14 NOTE — Procedures (Signed)
    SLEEP STUDY REPORT Patient Information Study Date: 01/09/2024 Patient Name: Amy Williamson Patient ID: [id number] Birth Date: February 09, 1951 Age: 73 Gender: Female BMI: 41.6 (W=211 lb, H=5' 0'') Stopbang: 6 Referring Physician: Santo Kelly, MD  TEST DESCRIPTION: Home sleep apnea testing was completed using the WatchPat, a Type 1 device, utilizing  peripheral arterial tonometry (PAT), chest movement, actigraphy, pulse oximetry, pulse rate, body position and snore.  AHI was calculated with apnea and hypopnea using valid sleep time as the denominator. RDI includes apneas,  hypopneas, and RERAs. The data acquired and the scoring of sleep and all associated events were performed in  accordance with the recommended standards and specifications as outlined in the AASM Manual for the Scoring of  Sleep and Associated Events 2.2.0 (2015).  FINDINGS:  1. Moderate Obstructive Sleep Apnea with AHI 27.4/hr.   2. No Central Sleep Apnea with pAHIc 1.9/hr.  3. Oxygen desaturations as low as 70%.  4. Severe snoring was present. O2 sats were < 88% for 26.1 min.  5. Total sleep time was 5 hrs and 50 min.  6. 29% of total sleep time was spent in REM sleep.   7. Normal sleep onset latency at 22 min  8. Prolonged REM sleep onset latency at 130 min.   9. Total awakenings were 7.  10. Arrhythmia detection: None  DIAGNOSIS:  Moderate Obstructive Sleep Apnea (G47.33) Nocturnal Hypoxemia  RECOMMENDATIONS: 1. Clinical correlation of these findings is necessary. The decision to treat obstructive sleep apnea (OSA) is usually  based on the presence of apnea symptoms or the presence of associated medical conditions such as Hypertension,  Congestive Heart Failure, Atrial Fibrillation or Obesity. The most common symptoms of OSA are snoring, gasping for  breath while sleeping, daytime sleepiness and fatigue.  2. Initiating apnea therapy is recommended given the presence of symptoms and/or associated  conditions.  Recommend proceeding with one of the following:  a. Auto-CPAP therapy with a pressure range of 5-20cm H2O.  b. An oral appliance (OA) that can be obtained from certain dentists with expertise in sleep medicine. These are  primarily of use in non-obese patients with mild and moderate disease.  c. An ENT consultation which may be useful to look for specific causes of obstruction and possible treatment  options.  d. If patient is intolerant to PAP therapy, consider referral to ENT for evaluation for hypoglossal nerve stimulator.  3. Close follow-up is necessary to ensure success with CPAP or oral appliance therapy for maximum benefit . 4. A follow-up oximetry study on CPAP is recommended to assess the adequacy of therapy and determine the need  for supplemental oxygen or the potential need for Bi-level therapy. An arterial blood gas to determine the adequacy of  baseline ventilation and oxygenation should also be considered. 5. Healthy sleep recommendations include: adequate nightly sleep (normal 7-9 hrs/night), avoidance of caffeine after  noon and alcohol near bedtime, and maintaining a sleep environment that is cool, dark and quiet. 6. Weight loss for overweight patients is recommended. Even modest amounts of weight loss can significantly  improve the severity of sleep apnea. 7. Snoring recommendations include: weight loss where appropriate, side sleeping, and avoidance of alcohol before  bed. 8. Operation of motor vehicle should not be performed when sleepy.  Signature: Wilbert Bihari, MD; [facility]; Diplomat, American Board of Sleep  Medicine Electronically Signed: 01/14/2024 8:59:17 AM

## 2024-01-15 ENCOUNTER — Telehealth: Payer: Self-pay | Admitting: Pharmacist Clinician (PhC)/ Clinical Pharmacy Specialist

## 2024-01-15 ENCOUNTER — Encounter: Payer: Self-pay | Admitting: Cardiology

## 2024-01-15 ENCOUNTER — Other Ambulatory Visit (HOSPITAL_COMMUNITY): Payer: Self-pay

## 2024-01-15 ENCOUNTER — Telehealth: Payer: Self-pay | Admitting: Pharmacy Technician

## 2024-01-15 NOTE — Telephone Encounter (Signed)
 Pharmacy Patient Advocate Encounter  Received notification from [facility] ADVANTAGE/RX ADVANCE that Prior Authorization for zepbound 2.5mg  has been DENIED.  Full denial letter will be uploaded to the media tab. See denial reason below.

## 2024-01-15 NOTE — Telephone Encounter (Signed)
 Pharmacy Patient Advocate Encounter   Received notification from Pt Calls Messages-Kristin that prior authorization for zepbound 2.5mg  is required/requested.   Insurance verification completed.   The patient is insured through [employer] ADVANTAGE/RX ADVANCE .   Per test claim: PA required; PA submitted to above mentioned insurance via CoverMyMeds Key/confirmation #/EOC [id number] Status is pending

## 2024-01-15 NOTE — Telephone Encounter (Signed)
 Patient found to have moderate OSA, please try PA for Zepbound.

## 2024-01-16 ENCOUNTER — Ambulatory Visit: Payer: Self-pay | Admitting: Internal Medicine

## 2024-01-16 ENCOUNTER — Ambulatory Visit (HOSPITAL_COMMUNITY)
Admission: RE | Admit: 2024-01-16 | Discharge: 2024-01-16 | Disposition: A | Discharge status: Home / Self Care | Source: Ambulatory Visit | Attending: Cardiovascular Disease | Admitting: Cardiovascular Disease

## 2024-01-16 ENCOUNTER — Telehealth: Payer: Self-pay

## 2024-01-16 DIAGNOSIS — G4733 Obstructive sleep apnea (adult) (pediatric): Secondary | ICD-10-CM | POA: Diagnosis not present

## 2024-01-16 DIAGNOSIS — I4892 Unspecified atrial flutter: Secondary | ICD-10-CM | POA: Insufficient documentation

## 2024-01-16 DIAGNOSIS — R600 Localized edema: Secondary | ICD-10-CM

## 2024-01-16 DIAGNOSIS — I2510 Atherosclerotic heart disease of native coronary artery without angina pectoris: Secondary | ICD-10-CM | POA: Diagnosis not present

## 2024-01-16 DIAGNOSIS — R5382 Chronic fatigue, unspecified: Secondary | ICD-10-CM | POA: Diagnosis not present

## 2024-01-16 DIAGNOSIS — I4891 Unspecified atrial fibrillation: Secondary | ICD-10-CM | POA: Insufficient documentation

## 2024-01-16 LAB — ECHOCARDIOGRAM COMPLETE
Area-P 1/2: 3.03 cm2
S' Lateral: 3.2 cm

## 2024-01-16 MED ORDER — PERFLUTREN LIPID MICROSPHERE
1.0000 mL | INTRAVENOUS | Status: AC | PRN
  Administered 2024-01-16: 2 mL via INTRAVENOUS

## 2024-01-16 NOTE — Telephone Encounter (Signed)
 Notified patient of sleep study results and recommendations. Patient stated the repeat sleep study is to qualify for Zepbound. All questions were answered and patient verbalized understanding.

## 2024-01-16 NOTE — Telephone Encounter (Signed)
-----   Message from Wilbert Bihari sent at 01/14/2024  9:00 AM EDT ----- Please let patient know that they have sleep apnea.  Recommend therapeutic CPAP titration for treatment of patient's sleep disordered breathing.

## 2024-01-17 ENCOUNTER — Telehealth: Payer: Self-pay | Admitting: Pharmacy Technician

## 2024-01-17 NOTE — Telephone Encounter (Signed)
 Faxed appeal to [employer] with letter from Kristin

## 2024-01-17 NOTE — Telephone Encounter (Signed)
 Zepbound PA was just denied and now requires an appeal (see chart media)

## 2024-01-21 ENCOUNTER — Other Ambulatory Visit (HOSPITAL_COMMUNITY): Payer: Self-pay

## 2024-01-21 NOTE — Telephone Encounter (Signed)
 I called [employer] and they said they received the appeal and should have decision by 01/24/24

## 2024-01-21 NOTE — Telephone Encounter (Signed)
 Pharmacy Patient Advocate Encounter  Received notification from [facility] ADVANTAGE/RX ADVANCE that Prior Authorization for zepbound 2.5mg  has been APPROVED from 01/21/24 to 07/22/24. Ran test claim, Copay is $85.54. This test claim was processed through [facility]- copay amounts may vary at other pharmacies due to pharmacy/plan contracts, or as the patient moves through the different stages of their insurance plan.

## 2024-01-23 ENCOUNTER — Other Ambulatory Visit (HOSPITAL_COMMUNITY): Payer: Self-pay

## 2024-01-23 MED ORDER — TIRZEPATIDE-WEIGHT MANAGEMENT 2.5 MG/0.5ML ~~LOC~~ SOLN: 2.5000 mg | SUBCUTANEOUS | 0 refills | Status: DC

## 2024-01-23 MED ORDER — TIRZEPATIDE-WEIGHT MANAGEMENT 2.5 MG/0.5ML ~~LOC~~ SOLN
2.5000 mg | SUBCUTANEOUS | 0 refills | Status: DC
  Filled 2024-01-23: qty 2, 28d supply, fill #0

## 2024-01-23 NOTE — Addendum Note (Signed)
 Addended by: Fuller Makin L on: 01/23/2024 06:33 AM   Modules accepted: Orders

## 2024-02-05 DIAGNOSIS — H43813 Vitreous degeneration, bilateral: Secondary | ICD-10-CM | POA: Diagnosis not present

## 2024-02-05 DIAGNOSIS — H04123 Dry eye syndrome of bilateral lacrimal glands: Secondary | ICD-10-CM | POA: Diagnosis not present

## 2024-02-05 DIAGNOSIS — H524 Presbyopia: Secondary | ICD-10-CM | POA: Diagnosis not present

## 2024-02-05 DIAGNOSIS — H35033 Hypertensive retinopathy, bilateral: Secondary | ICD-10-CM | POA: Diagnosis not present

## 2024-02-05 DIAGNOSIS — Z961 Presence of intraocular lens: Secondary | ICD-10-CM | POA: Diagnosis not present

## 2024-02-21 ENCOUNTER — Other Ambulatory Visit (HOSPITAL_COMMUNITY): Payer: Self-pay

## 2024-02-21 ENCOUNTER — Telehealth: Payer: Self-pay

## 2024-02-21 MED ORDER — ZEPBOUND 5 MG/0.5ML ~~LOC~~ SOAJ
5.0000 mg | SUBCUTANEOUS | 0 refills | Status: DC
Start: 2024-02-21 — End: 2024-02-25

## 2024-02-21 NOTE — Telephone Encounter (Signed)
 Pharmacy Patient Advocate Encounter   Received notification from Physician's Office that prior authorization for ZEPBOUND  is required/requested.   Insurance verification completed.   The patient is insured through [facility] ADVANTAGE/RX ADVANCE .   Per test claim: PA required; PA submitted to above mentioned insurance via CoverMyMeds Key/confirmation #/EOC [id number] Status is pending

## 2024-02-21 NOTE — Addendum Note (Signed)
 Addended by: Hoa Deriso L on: 02/21/2024 08:29 AM   Modules accepted: Orders

## 2024-02-22 ENCOUNTER — Other Ambulatory Visit: Payer: Self-pay | Admitting: Cardiology

## 2024-02-22 DIAGNOSIS — I4819 Other persistent atrial fibrillation: Secondary | ICD-10-CM

## 2024-02-24 ENCOUNTER — Other Ambulatory Visit (HOSPITAL_COMMUNITY): Payer: Self-pay

## 2024-02-24 NOTE — Progress Notes (Unsigned)
 Cardiology Office Note:  .   Date:  02/24/2024  ID:  Amy Williamson, DOB [birth year], MRN [id number] PCP: Amy Williamson LABOR, MD  [facility] Providers Cardiologist:  None Electrophysiologist:  Amy ONEIDA HOLTS, MD  Sleep Medicine:  Amy Bihari, MD {  History of Present Illness: .   Amy Williamson is a 73 y.o. female w/PMHx of  HTN, HLD, morbid obesity Aortic atherosclerosis OSA (uses dental appliance) AFib  Ablation 11/28/23 (planned to continue [facility] to 90day post ablation mark)  Saw the AFib clinic team 12/30/23, no symptoms of AFib or procedural concerns, reported chronic sounding c/o fatigue, not improved with SR   Today's visit is scheduled as her post ablation visit ROS:   She is accompanied by her husband Doing well She has not had any Afib post ablation and reports her symptoms were clear/she was very aware of her arrhythmia No CP, SOB She is not very active, orthopedic pains limit her  No dizzy spells, near syncope or syncope No bleeding, signs of bleeding   Arrhythmia/AAD hx AFib Flecainide  2023 > quickly stopped poorly tolerated AFib ablation 03/30/22 Watchman 06/28/22 AFlutter found Dec 2024 > amiodarone  > stopped Jan 2025 (seems patient preference/anxiety associated with the medication) AFib (RIPV required re-isolation) and CTI ablation 11/28/23  Studies Reviewed: Amy Williamson    EKG done today and reviewed by myself:  SR 66bpm   01/16/24: TTE 1. Left ventricular ejection fraction, by estimation, is 60 to 65%. The  left ventricle has normal function. The left ventricle has no regional  wall motion abnormalities. Left ventricular diastolic parameters are  consistent with Grade II diastolic  dysfunction (pseudonormalization).   2. Right ventricular systolic function is normal. The right ventricular  size is normal. There is normal pulmonary artery systolic pressure. The  estimated right ventricular systolic pressure is 32.6 mmHg.   3. The mitral valve is  normal in structure. Mild mitral valve  regurgitation. No evidence of mitral stenosis.   4. The aortic valve is normal in structure. Aortic valve regurgitation is  not visualized. No aortic stenosis is present.   5. The inferior vena cava is normal in size with greater than 50%  respiratory variability, suggesting right atrial pressure of 3 mmHg.     11/28/23: EPS/ablation CONCLUSIONS: 1. Successful redo PVI (RIPV required reisolation) 2. Successful redo ablation/isolation of the posterior wall 3. Successful cavotricuspid isthmus ablation for typical atrial flutter 4. Intracardiac echo reveals trivial pericardial effusion, normal LA architecture, well positioned Watchman device 5. No early apparent complications.    06/28/22: LAAO CONCLUSIONS:  1.Successful implantation of a WATCHMAN left atrial appendage occlusive device 2. TEE demonstrating no LAA thrombus 3. No early apparent complications.   Post Implant Anticoagulation Strategy: Continue Eliquis  5mg  PO BID x 45 days after Watchman implant and then transition to Plavix  75mg  PO daily to complete 6 months of post implant therapy.   03/30/22: EPS/ablation CONCLUSIONS: 1. Successful PVI 2. Successful ablation/isolation of the posterior wall 3. Intracardiac echo reveals trivial pericardial effusion, normal left atrial architecture 4. No early apparent complications. 5.  Protonix  40 mg by mouth once daily for 45 days 6.  Colchicine  0.6 mg by mouth twice daily for 5 days     Risk Assessment/Calculations:    Physical Exam:   VS:  LMP 07/23/2004    Wt Readings from Last 3 Encounters:  01/03/24 211 lb (95.7 kg)  12/30/23 213 lb (96.6 kg)  11/28/23 205 lb (93 kg)    GEN: Well  nourished, well developed in no acute distress NECK: No JVD; No carotid bruits CARDIAC: RRR, no murmurs, rubs, gallops RESPIRATORY:  CTA b/l without rales, wheezing or rhonchi  ABDOMEN: Soft, non-tender, non-distended EXTREMITIES: No edema; No deformity    ASSESSMENT AND PLAN: .    persistent AFib AFlutter CHA2DS2Vasc is 3, on eliquis , appropriately dosed Stable/well positioned watchman noted via ICE  Last day of Eliquis  8/87/25 Given some Coronary Ca/Amy Williamson notes mention some CAD  Start ECASA 81mg  daily on 02/29/24   HTN Home numbers better 130 or so     Dispo: she sees Dr. Emilee in Dec > we can see her back ~ June  Signed, Amy Streetman Macario Arthur, PA-C

## 2024-02-24 NOTE — Telephone Encounter (Signed)
 Prescription refill request for Amy Williamson  received. Indication: AF Last office visit: 01/03/24  Amy Leavens MD Scr: 0.80 on 11/13/23  Epic Age: 73 Weight: 95.7kg  Based on above findings Amy Williamson  5mg  twice daily Is the appropriate dose.  Refill approved.

## 2024-02-24 NOTE — Telephone Encounter (Signed)
 Pharmacy Patient Advocate Encounter  Received notification from [employer] that Prior Authorization for ZEPBOUND  has been APPROVED to 07/22/24

## 2024-02-25 MED ORDER — ZEPBOUND 5 MG/0.5ML ~~LOC~~ SOAJ: 5.0000 mg | SUBCUTANEOUS | 0 refills | Status: DC

## 2024-02-25 NOTE — Addendum Note (Signed)
 Addended by: Zoraida Havrilla L on: 02/25/2024 12:58 PM   Modules accepted: Orders

## 2024-02-26 ENCOUNTER — Ambulatory Visit: Attending: Physician Assistant | Admitting: Physician Assistant

## 2024-02-26 ENCOUNTER — Telehealth: Payer: Self-pay | Admitting: Internal Medicine

## 2024-02-26 ENCOUNTER — Encounter: Payer: Self-pay | Admitting: Physician Assistant

## 2024-02-26 VITALS — BP 140/76 | HR 66 | Ht 60.0 in | Wt 202.4 lb

## 2024-02-26 DIAGNOSIS — I4819 Other persistent atrial fibrillation: Secondary | ICD-10-CM

## 2024-02-26 DIAGNOSIS — I484 Atypical atrial flutter: Secondary | ICD-10-CM | POA: Diagnosis not present

## 2024-02-26 DIAGNOSIS — I10 Essential (primary) hypertension: Secondary | ICD-10-CM

## 2024-02-26 NOTE — Telephone Encounter (Signed)
 Pt dropped envelope for chanderasekhar 02/26/2024. Will place in mailbox on 5th floor.   02/26/2024 KS

## 2024-02-26 NOTE — Patient Instructions (Addendum)
 Medication Instructions:   START TAKING:  02-29-24  ASPIRIN   81 MG ONCE A DAY    STOP TAKING AND REMOVE THIS MEDICATION FROM YOUR MEDICATION LIST:  ELIQUIS   LAST DOSE 02-28-24      *If you need a refill on your cardiac medications before your next appointment, please call your pharmacy*    Lab Work:NONE ORDERED  TODAY     If you have labs (blood work) drawn today and your tests are completely normal, you will receive your results only by: MyChart Message (if you have MyChart) OR A paper copy in the mail If you have any lab test that is abnormal or we need to change your treatment, we will call you to review the results.   Testing/Procedures: NONE ORDERED  TODAY     Follow-Up: At [facility], you and your health needs are our priority.  As part of our continuing mission to provide you with exceptional heart care, our providers are all part of one team.  This team includes your primary Cardiologist (physician) and Advanced Practice Providers or APPs (Physician Assistants and Nurse Practitioners) who all work together to provide you with the care you need, when you need it.   Your next appointment:  DR LOVETTA IN [place]                                                                          AND   10 MONTHS  IN JUNE 2026   Provider:  You may see OLE ONEIDA HOLTS, MD or Charlies Arthur, PA-C ( CONTACT  CASSIE HALL/ ANGELINE HAMMER FOR EP SCHEDULING ISSUES )     We recommend signing up for the patient portal called MyChart.  Sign up information is provided on this After Visit Summary.  MyChart is used to connect with patients for Virtual Visits (Telemedicine).  Patients are able to view lab/test results, encounter notes, upcoming appointments, etc.  Non-urgent messages can be sent to your provider as well.   To learn more about what you can do with MyChart, go to [email or web address].   Other Instructions

## 2024-03-09 MED ORDER — AMLODIPINE BESYLATE 5 MG PO TABS: 5.0000 mg | ORAL_TABLET | Freq: Every day | ORAL | 3 refills | Status: DC

## 2024-03-09 NOTE — Telephone Encounter (Signed)
 Called pt advised of MD recommendation. Pt is agreeable to plan script for amlodipine  5 mg sent to pt pharmacy.   Pt reports has previously taken amlodipine  without difficulty.  Pt would like to make MD aware since starting Zepbound  5-6 weeks ago has lost 15 lbs and feels good.  Pt would like to thank MD for recommending medication.

## 2024-03-09 NOTE — Telephone Encounter (Signed)
 MD reviewed BP readings.  Recommends pt stop diltiazem  120 mg and start amlodipine  5 mg daily.

## 2024-03-09 NOTE — Addendum Note (Signed)
 Addended by: RANDY HAMP SAILOR on: 03/09/2024 02:13 PM   Modules accepted: Orders

## 2024-03-19 MED ORDER — ZEPBOUND 7.5 MG/0.5ML ~~LOC~~ SOAJ: 7.5000 mg | SUBCUTANEOUS | 0 refills | Status: DC

## 2024-03-19 NOTE — Addendum Note (Signed)
 Addended by: Jacoby Zanni L on: 03/19/2024 02:17 PM   Modules accepted: Orders

## 2024-04-01 NOTE — Addendum Note (Signed)
 Addended by: Virginio Isidore L on: 04/01/2024 01:06 PM   Modules accepted: Orders

## 2024-04-06 DIAGNOSIS — Z131 Encounter for screening for diabetes mellitus: Secondary | ICD-10-CM | POA: Diagnosis not present

## 2024-04-06 DIAGNOSIS — E782 Mixed hyperlipidemia: Secondary | ICD-10-CM | POA: Diagnosis not present

## 2024-04-07 LAB — LIPID PANEL
Chol/HDL Ratio: 2.9 ratio (ref 0.0–4.4)
Cholesterol, Total: 155 mg/dL (ref 100–199)
HDL: 53 mg/dL (ref >39)
LDL Chol Calc (NIH): 81 mg/dL (ref 0–99)
Triglycerides: 120 mg/dL (ref 0–149)
VLDL Cholesterol Cal: 21 mg/dL (ref 5–40)

## 2024-04-07 LAB — HEMOGLOBIN A1C
Est. average glucose Bld gHb Est-mCnc: 108 mg/dL
Hgb A1c MFr Bld: 5.4 % (ref 4.8–5.6)

## 2024-04-07 LAB — HEPATIC FUNCTION PANEL
ALT: 13 IU/L (ref 0–32)
AST: 16 IU/L (ref 0–40)
Albumin: 4.4 g/dL (ref 3.8–4.8)
Alkaline Phosphatase: 89 IU/L (ref 51–125)
Bilirubin Total: 0.5 mg/dL (ref 0.0–1.2)
Bilirubin, Direct: 0.17 mg/dL (ref 0.00–0.40)
Total Protein: 6.3 g/dL (ref 6.0–8.5)

## 2024-04-10 ENCOUNTER — Ambulatory Visit: Payer: Self-pay | Admitting: Pharmacist Clinician (PhC)/ Clinical Pharmacy Specialist

## 2024-04-10 ENCOUNTER — Other Ambulatory Visit: Payer: Self-pay | Admitting: Pharmacist Clinician (PhC)/ Clinical Pharmacy Specialist

## 2024-04-10 ENCOUNTER — Encounter: Payer: Self-pay | Admitting: Pharmacist Clinician (PhC)/ Clinical Pharmacy Specialist

## 2024-04-10 DIAGNOSIS — E782 Mixed hyperlipidemia: Secondary | ICD-10-CM

## 2024-04-17 MED ORDER — ZEPBOUND 7.5 MG/0.5ML ~~LOC~~ SOAJ: 7.5000 mg | SUBCUTANEOUS | 0 refills | Status: DC

## 2024-05-14 MED ORDER — ZEPBOUND 10 MG/0.5ML ~~LOC~~ SOAJ: 10.0000 mg | SUBCUTANEOUS | 0 refills | Status: DC

## 2024-05-14 NOTE — Addendum Note (Signed)
 Addended by: Etheline Geppert L on: 05/14/2024 09:15 PM   Modules accepted: Orders

## 2024-06-04 ENCOUNTER — Telehealth (HOSPITAL_COMMUNITY): Payer: Self-pay | Admitting: *Deleted

## 2024-06-04 MED ORDER — AMLODIPINE BESYLATE 5 MG PO TABS: 5.0000 mg | ORAL_TABLET | Freq: Every day | ORAL | Status: DC

## 2024-06-04 MED ORDER — DILTIAZEM HCL ER COATED BEADS 120 MG PO CP24: 120.0000 mg | ORAL_CAPSULE | Freq: Every day | ORAL | Status: DC

## 2024-06-04 NOTE — Telephone Encounter (Signed)
 Pt called in stating back in afib - HR 140s  BP 110s/60s - started last night about 1130pm - she took a flecainide  last night (not sure why she chose that hasn't been on in years) only other med she has at home is 120mg  of diltiazem  that she had been on the past.  Discussed with Daril Kicks PA will hold amlodipine  and take diltiazem  120mg  daily no PIP flecainide  recommended at this time. Pt has appt scheduled for tomorrow for assessment. Pt in agreement with plan.

## 2024-06-05 ENCOUNTER — Ambulatory Visit (HOSPITAL_COMMUNITY)
Admission: RE | Admit: 2024-06-05 | Discharge: 2024-06-05 | Disposition: A | Discharge status: Home / Self Care | Source: Ambulatory Visit | Attending: Physician Assistant | Admitting: Physician Assistant

## 2024-06-05 VITALS — BP 100/80 | HR 152 | Ht 60.0 in | Wt 177.0 lb

## 2024-06-05 DIAGNOSIS — I484 Atypical atrial flutter: Secondary | ICD-10-CM

## 2024-06-05 DIAGNOSIS — I4819 Other persistent atrial fibrillation: Secondary | ICD-10-CM | POA: Diagnosis not present

## 2024-06-05 DIAGNOSIS — D6869 Other thrombophilia: Secondary | ICD-10-CM | POA: Diagnosis not present

## 2024-06-05 DIAGNOSIS — I4891 Unspecified atrial fibrillation: Secondary | ICD-10-CM

## 2024-06-05 MED ORDER — APIXABAN 5 MG PO TABS: 5.0000 mg | ORAL_TABLET | Freq: Two times a day (BID) | ORAL | 3 refills | Status: DC

## 2024-06-05 MED ORDER — DILTIAZEM HCL ER COATED BEADS 120 MG PO CP24: 120.0000 mg | ORAL_CAPSULE | Freq: Every day | ORAL | 3 refills | Status: DC

## 2024-06-05 NOTE — H&P (View-Only) (Signed)
 Primary Care Physician: Rollene Almarie LABOR, MD Primary Cardiologist: Dr Santo Primary Electrophysiologist: Dr Cindie Referring Physician: Josefa Beauvais NP   Amy Williamson is a 73 y.o. female with a history of HTN, aortic atherosclerosis, OSA, HLD, atrial fibrillation who presents for follow up in the [facility].  The patient was initially diagnosed with atrial fibrillation 06/2020 and underwent DCCV on 07/28/20. She presented to the ED 09/26/21 with symptoms palpitations and underwent DCCV but unfortunately returned to the ED on 10/06/21 and had another DCCV. She has symptoms of fatigue while in afib and for several days afterwards. She did have a steroid injection for bursitis a week before her first ED visit. She was started on flecainide  but had intolerable side effects. She did have a breakthrough episode of afib 12/12/21 and took a PRN dose of flecainide  which converted her to SR.   S/p afib ablation 03/30/22 with Dr Cindie. S/p Watchman implant 06/28/22. She presented to the emergency department July 04, 2023 with rapidly conducted atrial flutter. Eliquis  was restarted and she was set up for outpatient cardioversion. She was started on amiodarone  and underwent DCCV on 07/11/23. She then underwent repeat ablation, afib and flutter, with Dr Cindie on 11/28/23. Amiodarone  was discontinue prior to ablation.   Patient returns for follow up for atrial fibrillation and atrial flutter. She reports that her heart began to race on Wednesday of this week. There were no specific triggers that she could identify. She is also fatigued and SOB. Her amlodipine  was held and diltiazem  resumed.   Today, she  denies symptoms of orthopnea, PND, lower extremity edema, dizziness, presyncope, syncope, snoring, daytime somnolence, bleeding, or neurologic sequela. The patient is tolerating medications without difficulties and is otherwise without complaint today.    Atrial  Fibrillation Risk Factors:  she does have symptoms or diagnosis of sleep apnea. she does not have a history of rheumatic fever. she does not have a history of alcohol use. The patient does not have a history of early familial atrial fibrillation or other arrhythmias.   Atrial Fibrillation Management history:  Previous antiarrhythmic drugs: flecainide , amiodarone  (did not tolerate well)  Previous cardioversions: 07/28/20, 09/26/21, 10/06/21, 07/11/23 Previous ablations: 03/30/22, 11/28/23 Anticoagulation history: Eliquis    Past Medical History:  Diagnosis Date   Anticoagulant long-term use    eliquis  --- managed by cardiology   Arthritis    BACK AND SHOULDERS   Depressive disorder, not elsewhere classified    Essential hypertension    followed by pcp and cardiology   Frequency of urination    GERD (gastroesophageal reflux disease)    Headache    migraines when younger   History of dysphagia 2015   s/p egd w/ dilation, pt did not have stricture   History of gastritis    OSA (obstructive sleep apnea)    followed by dr t. 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 Cindie   Pure hypercholesterolemia     Current Outpatient Medications  Medication Sig Dispense Refill   acetaminophen  (TYLENOL ) 650 MG CR tablet Take 1,300 mg by mouth in the morning and at bedtime.     amLODipine  (NORVASC ) 5 MG tablet  Take 1 tablet (5 mg total) by mouth daily. Hold while on diltiazem      aspirin  EC 81 MG tablet Take 81 mg by mouth daily. Swallow whole.     benzonatate  (TESSALON ) 100 MG capsule Take 1 capsule (100 mg total) by mouth every 8 (eight) hours. 21 capsule 0   bisacodyl  (DULCOLAX) 5 MG EC tablet Take 5 mg by mouth daily as needed for moderate constipation. (Patient taking differently: Take 5 mg by mouth every morning.)     Cholecalciferol (VITAMIN D3 PO) Take 1,000 Units by mouth in the morning.     Cyanocobalamin  (B-12 PO) Take 1,000 mcg by mouth in the morning.     diltiazem  (CARDIZEM  CD) 120 MG 24 hr capsule Take 1 capsule (120 mg total) by mouth daily.     Evolocumab  (REPATHA  SURECLICK) 140 MG/ML SOAJ Inject 140 mg into the skin every 14 (fourteen) days. 6 mL 3   furosemide  (LASIX ) 20 MG tablet Take 1 tablet (20 mg total) by mouth daily as needed. (Patient taking differently: Take 20 mg by mouth as needed.) 30 tablet 11   pantoprazole  (PROTONIX ) 40 MG tablet Take 1 tablet (40 mg total) by mouth daily. 100 tablet 3   tirzepatide  (ZEPBOUND ) 10 MG/0.5ML Pen Inject 10 mg into the skin once a week. 2 mL 0   No current facility-administered medications for this encounter.    ROS- All systems are reviewed and negative except as per the HPI above.  Physical Exam: Vitals:   06/05/24 0819  BP: 100/80  Pulse: (!) 152  Weight: 80.3 kg  Height: 5' (1.524 m)    GEN: Well nourished, well developed in no acute distress CARDIAC: Regular rate and rhythm, tachycardia, no murmurs, rubs, gallops RESPIRATORY:  Clear to auscultation without rales, wheezing or rhonchi  ABDOMEN: Soft, non-tender, non-distended EXTREMITIES:  No edema; No deformity    Wt Readings from Last 3 Encounters:  06/05/24 80.3 kg  02/26/24 91.8 kg  01/03/24 95.7 kg    EKG today demonstrates  Atrial flutter with 2:1 block Vent. rate 152 BPM PR interval * ms QRS duration 76 ms QT/QTcB 302/480 ms   Echo 01/10/22 demonstrated    1. Left ventricular ejection fraction, by estimation, is 60 to 65%. The  left ventricle has normal function. The left ventricle has no regional  wall motion abnormalities. Left ventricular diastolic parameters are  indeterminate.   2. Right ventricular systolic function is normal. The right ventricular  size is normal. There is normal pulmonary artery systolic pressure.   3. Left atrial size was mildly dilated.   4. The mitral valve is normal in structure. Mild mitral valve  regurgitation. No evidence of mitral stenosis.   5. The aortic valve is grossly normal. Aortic valve regurgitation is not  visualized. No aortic stenosis is present.   6. The inferior vena cava is normal in size with greater than 50%  respiratory variability, suggesting right atrial pressure of 3 mmHg.   Comparison(s): No significant change from prior study.   Conclusion(s)/Recommendation(s): Otherwise normal echocardiogram, with minor abnormalities described in the report.   Epic records are reviewed at length today   CHA2DS2-VASc Score = 3  The patient's score  is based upon: CHF History: 0 HTN History: 1 Diabetes History: 0 Stroke History: 0 Vascular Disease History: 0 Age Score: 1 Gender Score: 1       ASSESSMENT AND PLAN: Persistent Atrial Fibrillation/atrial flutter (ICD10:  I48.19) The patient's CHA2DS2-VASc score is 3, indicating a 3.2% annual risk of stroke.   S/p afib ablation 03/30/22, afib and flutter ablation 11/28/23 Patient in rapid atrial flutter today, symptomatic. We discussed rhythm control options today. Will plan for DCCV in the short term. Long term, patient interested in dofetilide admission.  Stop ASA and start Eliquis  5 mg BID PharmD to screen medications for QT prolonging agents. QTc in SR 400 ms Check bmet/mag/cbc today.  Continue diltiazem  120 mg daily ED precautions reviewed.   Secondary Hypercoagulable State (ICD10:  D68.69) The patient is at significant risk for  stroke/thromboembolism based upon her CHA2DS2-VASc Score of 3.  Start Apixaban  (Eliquis ).   Obesity Body mass index is 34.57 kg/m.  Encouraged lifestyle modification She has lost >30 lbs on Zepbound .   OSA  Encouraged nightly use of oral device  HTN Stable on current regimen Amlodipine  currently on hold while she is on diltiazem .   Follow up in the AF clinic for dofetilide loading post DCCV.    Informed Consent   Shared Decision Making/Informed Consent The risks (stroke, cardiac arrhythmias rarely resulting in the need for a temporary or permanent pacemaker, skin irritation or burns and complications associated with conscious sedation including aspiration, arrhythmia, respiratory failure and death), benefits (restoration of normal sinus rhythm) and alternatives of a direct current cardioversion were explained in detail to Ms. Juran and she agrees to proceed.      Daril Kicks PA-C Afib Clinic [facility] [address] [place], [place] [address] [phone] 06/05/2024 8:32 AM

## 2024-06-05 NOTE — Patient Instructions (Addendum)
 Stop aspirin   Hold amlodipine   Continue cardizem  120mg  once a day  Start Eliquis  5mg  twice a day   Hold below medications 7 days prior to scheduled procedure/anesthesia.  Restart medication on the normal dosing day after scheduled procedure/anesthesia Pasqual)    Cardioversion scheduled for: Thursday November 20th   - Arrive at the [facility] ([address])  and check in with ADMITTING at 11:00 AM   - Do not eat or drink anything after midnight the night prior to your procedure.   - Take all your morning medication (except diabetic medications) with a sip of water  prior to arrival.  - Do NOT miss any doses of your blood thinner - if you should miss a dose or take a dose more than 4 hours late -- please notify our office immediately.  - You will not be able to drive home after your procedure. Please ensure you have a responsible adult to drive you home. You will need someone with you for 24 hours post procedure.     - Expect to be in the procedural area approximately 2 hours.   - If you feel as if you go back into normal rhythm prior to scheduled cardioversion, please notify our office immediately.   If your procedure is canceled in the cardioversion suite you will be charged a cancellation fee.      [facility] Admission is scheduled for: Tuesday December 2nd   Come to the [facility] for pre-admission work up at: 9:00 AM   You may eat/drink prior to the appointment. Please take all your normal morning medications prior to arrival.   Medications that need to be held or stopped prior to admission date: HOLD ZEBOUND 27th   If you should miss any doses of your blood thinner going forward please notify our office immediately.   If you start any new medications after your admission is scheduled please notify our office immediately.  No Benadryl use at least 3 days prior to admission.   We will start the  authorization process with your insurance for the hospital stay.   You may bring a suitcase when you check in to admissions with belongings for your stay - clothes to keep you comfortable and things to keep you busy.   All medications will be provided for you while in the hospital.  If you are on a CPAP machine - please bring with you to the hospital.    Let me know if you have any questions regarding the above instructions or about the admission in general.   [facility] RN Afib Clinic [phone]

## 2024-06-05 NOTE — Progress Notes (Addendum)
 Primary Care Physician: Rollene Almarie LABOR, MD Primary Cardiologist: Dr Santo Primary Electrophysiologist: Dr Cindie Referring Physician: Josefa Beauvais NP   Amy Williamson is a 73 y.o. female with a history of HTN, aortic atherosclerosis, OSA, HLD, atrial fibrillation who presents for follow up in the [facility].  The patient was initially diagnosed with atrial fibrillation 06/2020 and underwent DCCV on 07/28/20. She presented to the ED 09/26/21 with symptoms palpitations and underwent DCCV but unfortunately returned to the ED on 10/06/21 and had another DCCV. She has symptoms of fatigue while in afib and for several days afterwards. She did have a steroid injection for bursitis a week before her first ED visit. She was started on flecainide  but had intolerable side effects. She did have a breakthrough episode of afib 12/12/21 and took a PRN dose of flecainide  which converted her to SR.   S/p afib ablation 03/30/22 with Dr Cindie. S/p Watchman implant 06/28/22. She presented to the emergency department July 04, 2023 with rapidly conducted atrial flutter. Eliquis  was restarted and she was set up for outpatient cardioversion. She was started on amiodarone  and underwent DCCV on 07/11/23. She then underwent repeat ablation, afib and flutter, with Dr Cindie on 11/28/23. Amiodarone  was discontinue prior to ablation.   Patient returns for follow up for atrial fibrillation and atrial flutter. She reports that her heart began to race on Wednesday of this week. There were no specific triggers that she could identify. She is also fatigued and SOB. Her amlodipine  was held and diltiazem  resumed.   Today, she  denies symptoms of orthopnea, PND, lower extremity edema, dizziness, presyncope, syncope, snoring, daytime somnolence, bleeding, or neurologic sequela. The patient is tolerating medications without difficulties and is otherwise without complaint today.    Atrial  Fibrillation Risk Factors:  she does have symptoms or diagnosis of sleep apnea. she does not have a history of rheumatic fever. she does not have a history of alcohol use. The patient does not have a history of early familial atrial fibrillation or other arrhythmias.   Atrial Fibrillation Management history:  Previous antiarrhythmic drugs: flecainide , amiodarone  (did not tolerate well)  Previous cardioversions: 07/28/20, 09/26/21, 10/06/21, 07/11/23 Previous ablations: 03/30/22, 11/28/23 Anticoagulation history: Eliquis    Past Medical History:  Diagnosis Date   Anticoagulant long-term use    eliquis  --- managed by cardiology   Arthritis    BACK AND SHOULDERS   Depressive disorder, not elsewhere classified    Essential hypertension    followed by pcp and cardiology   Frequency of urination    GERD (gastroesophageal reflux disease)    Headache    migraines when younger   History of dysphagia 2015   s/p egd w/ dilation, pt did not have stricture   History of gastritis    OSA (obstructive sleep apnea)    followed by dr t. 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 Cindie   Pure hypercholesterolemia     Current Outpatient Medications  Medication Sig Dispense Refill   acetaminophen  (TYLENOL ) 650 MG CR tablet Take 1,300 mg by mouth in the morning and at bedtime.     amLODipine  (NORVASC ) 5 MG tablet  Take 1 tablet (5 mg total) by mouth daily. Hold while on diltiazem      aspirin  EC 81 MG tablet Take 81 mg by mouth daily. Swallow whole.     benzonatate  (TESSALON ) 100 MG capsule Take 1 capsule (100 mg total) by mouth every 8 (eight) hours. 21 capsule 0   bisacodyl  (DULCOLAX) 5 MG EC tablet Take 5 mg by mouth daily as needed for moderate constipation. (Patient taking differently: Take 5 mg by mouth every morning.)     Cholecalciferol (VITAMIN D3 PO) Take 1,000 Units by mouth in the morning.     Cyanocobalamin  (B-12 PO) Take 1,000 mcg by mouth in the morning.     diltiazem  (CARDIZEM  CD) 120 MG 24 hr capsule Take 1 capsule (120 mg total) by mouth daily.     Evolocumab  (REPATHA  SURECLICK) 140 MG/ML SOAJ Inject 140 mg into the skin every 14 (fourteen) days. 6 mL 3   furosemide  (LASIX ) 20 MG tablet Take 1 tablet (20 mg total) by mouth daily as needed. (Patient taking differently: Take 20 mg by mouth as needed.) 30 tablet 11   pantoprazole  (PROTONIX ) 40 MG tablet Take 1 tablet (40 mg total) by mouth daily. 100 tablet 3   tirzepatide  (ZEPBOUND ) 10 MG/0.5ML Pen Inject 10 mg into the skin once a week. 2 mL 0   No current facility-administered medications for this encounter.    ROS- All systems are reviewed and negative except as per the HPI above.  Physical Exam: Vitals:   06/05/24 0819  BP: 100/80  Pulse: (!) 152  Weight: 80.3 kg  Height: 5' (1.524 m)    GEN: Well nourished, well developed in no acute distress CARDIAC: Regular rate and rhythm, tachycardia, no murmurs, rubs, gallops RESPIRATORY:  Clear to auscultation without rales, wheezing or rhonchi  ABDOMEN: Soft, non-tender, non-distended EXTREMITIES:  No edema; No deformity    Wt Readings from Last 3 Encounters:  06/05/24 80.3 kg  02/26/24 91.8 kg  01/03/24 95.7 kg    EKG today demonstrates  Atrial flutter with 2:1 block Vent. rate 152 BPM PR interval * ms QRS duration 76 ms QT/QTcB 302/480 ms   Echo 01/10/22 demonstrated    1. Left ventricular ejection fraction, by estimation, is 60 to 65%. The  left ventricle has normal function. The left ventricle has no regional  wall motion abnormalities. Left ventricular diastolic parameters are  indeterminate.   2. Right ventricular systolic function is normal. The right ventricular  size is normal. There is normal pulmonary artery systolic pressure.   3. Left atrial size was mildly dilated.   4. The mitral valve is normal in structure. Mild mitral valve  regurgitation. No evidence of mitral stenosis.   5. The aortic valve is grossly normal. Aortic valve regurgitation is not  visualized. No aortic stenosis is present.   6. The inferior vena cava is normal in size with greater than 50%  respiratory variability, suggesting right atrial pressure of 3 mmHg.   Comparison(s): No significant change from prior study.   Conclusion(s)/Recommendation(s): Otherwise normal echocardiogram, with minor abnormalities described in the report.   Epic records are reviewed at length today   CHA2DS2-VASc Score = 3  The patient's score  is based upon: CHF History: 0 HTN History: 1 Diabetes History: 0 Stroke History: 0 Vascular Disease History: 0 Age Score: 1 Gender Score: 1       ASSESSMENT AND PLAN: Persistent Atrial Fibrillation/atrial flutter (ICD10:  I48.19) The patient's CHA2DS2-VASc score is 3, indicating a 3.2% annual risk of stroke.   S/p afib ablation 03/30/22, afib and flutter ablation 11/28/23 Patient in rapid atrial flutter today, symptomatic. We discussed rhythm control options today. Will plan for DCCV in the short term. Long term, patient interested in dofetilide admission.  Stop ASA and start Eliquis  5 mg BID PharmD to screen medications for QT prolonging agents. QTc in SR 400 ms Check bmet/mag/cbc today.  Continue diltiazem  120 mg daily ED precautions reviewed.   Secondary Hypercoagulable State (ICD10:  D68.69) The patient is at significant risk for  stroke/thromboembolism based upon her CHA2DS2-VASc Score of 3.  Start Apixaban  (Eliquis ).   Obesity Body mass index is 34.57 kg/m.  Encouraged lifestyle modification She has lost >30 lbs on Zepbound .   OSA  Encouraged nightly use of oral device  HTN Stable on current regimen Amlodipine  currently on hold while she is on diltiazem .   Follow up in the AF clinic for dofetilide loading post DCCV.    Informed Consent   Shared Decision Making/Informed Consent The risks (stroke, cardiac arrhythmias rarely resulting in the need for a temporary or permanent pacemaker, skin irritation or burns and complications associated with conscious sedation including aspiration, arrhythmia, respiratory failure and death), benefits (restoration of normal sinus rhythm) and alternatives of a direct current cardioversion were explained in detail to Ms. Juran and she agrees to proceed.      Daril Kicks PA-C [facility] [address] [place], [place] [address] [phone] 06/05/2024 8:32 AM

## 2024-06-06 LAB — CBC
Hematocrit: 45.2 % (ref 34.0–46.6)
Hemoglobin: 14.6 g/dL (ref 11.1–15.9)
MCH: 28.5 pg (ref 26.6–33.0)
MCHC: 32.3 g/dL (ref 31.5–35.7)
MCV: 88 fL (ref 79–97)
Platelets: 321 x10E3/uL (ref 150–450)
RBC: 5.12 x10E6/uL (ref 3.77–5.28)
RDW: 14.6 % (ref 11.7–15.4)
WBC: 11.9 x10E3/uL — ABNORMAL HIGH (ref 3.4–10.8)

## 2024-06-06 LAB — BASIC METABOLIC PANEL WITH GFR
BUN/Creatinine Ratio: 15 (ref 12–28)
BUN: 17 mg/dL (ref 8–27)
CO2: 21 mmol/L (ref 20–29)
Calcium: 9.9 mg/dL (ref 8.7–10.3)
Chloride: 99 mmol/L (ref 96–106)
Creatinine, Ser: 1.16 mg/dL — ABNORMAL HIGH (ref 0.57–1.00)
Glucose: 130 mg/dL — ABNORMAL HIGH (ref 70–99)
Potassium: 4.2 mmol/L (ref 3.5–5.2)
Sodium: 140 mmol/L (ref 134–144)
eGFR: 49 mL/min/1.73 — ABNORMAL LOW (ref >59)

## 2024-06-06 LAB — MAGNESIUM: Magnesium: 2.2 mg/dL (ref 1.6–2.3)

## 2024-06-08 ENCOUNTER — Ambulatory Visit (HOSPITAL_COMMUNITY): Payer: Self-pay | Admitting: Physician Assistant

## 2024-06-09 ENCOUNTER — Telehealth: Payer: Self-pay

## 2024-06-09 NOTE — Telephone Encounter (Signed)
 Medication list reviewed in anticipation of upcoming Tikosyn initiation.   Patient is anticoagulated on Eliquis  on the appropriate dose of 5 mg BID. Please ensure that patient has not missed any anticoagulation doses in the 3 weeks prior to Tikosyn initiation.   Patient will need to be counseled to avoid use of Benadryl while on Tikosyn and in the 2-3 days prior to Tikosyn initiation.  Patient is on diltiazem , which can increase plasma concentrations of dofetilide. Patient is also on furosemide , which can impact electrolytes and may increase the risk of arrhythmias. Close monitoring of toxicity and Qtc  is recommended.

## 2024-06-10 ENCOUNTER — Other Ambulatory Visit (HOSPITAL_COMMUNITY): Payer: Self-pay

## 2024-06-10 ENCOUNTER — Telehealth: Payer: Self-pay | Admitting: Pharmacy Technician

## 2024-06-10 NOTE — Anesthesia Preprocedure Evaluation (Signed)
 Anesthesia Evaluation  Patient identified by MRN, date of birth, ID band  Reviewed: Allergy & Precautions, NPO status , Patient's Chart, lab work & pertinent test results  History of Anesthesia Complications (+) PONV and history of anesthetic complications  Airway Mallampati: II  TM Distance: >3 FB Neck ROM: Full    Dental no notable dental hx. (+) Teeth Intact, Dental Advisory Given   Pulmonary sleep apnea    Pulmonary exam normal breath sounds clear to auscultation       Cardiovascular hypertension, Pt. on medications (-) angina + CAD  (-) Past MI Normal cardiovascular exam+ dysrhythmias (on apixiban) Atrial Fibrillation  Rhythm:Irregular Rate:Abnormal  12/2023 TTE   1. Left ventricular ejection fraction, by estimation, is 60 to 65%. The  left ventricle has normal function. The left ventricle has no regional  wall motion abnormalities. Left ventricular diastolic parameters are  consistent with Grade II diastolic  dysfunction (pseudonormalization).   2. Right ventricular systolic function is normal. The right ventricular  size is normal. There is normal pulmonary artery systolic pressure. The  estimated right ventricular systolic pressure is 32.6 mmHg.   3. The mitral valve is normal in structure. Mild mitral valve  regurgitation. No evidence of mitral stenosis.   4. The aortic valve is normal in structure. Aortic valve regurgitation is  not visualized. No aortic stenosis is present.   5. The inferior vena cava is normal in size with greater than 50%  respiratory variability, suggesting right atrial pressure of 3 mmHg.     Neuro/Psych    Depression       GI/Hepatic ,GERD  Medicated,,  Endo/Other  neg diabetes  onGLp 1 agonist  Renal/GU      Musculoskeletal   Abdominal   Peds  Hematology   Anesthesia Other Findings   Reproductive/Obstetrics                              Anesthesia  Physical Anesthesia Plan  ASA: 3  Anesthesia Plan: General   Post-op Pain Management: Minimal or no pain anticipated   Induction:   PONV Risk Score and Plan: Treatment may vary due to age or medical condition and Propofol  infusion  Airway Management Planned: Mask  Additional Equipment: None  Intra-op Plan:   Post-operative Plan:   Informed Consent: I have reviewed the patients History and Physical, chart, labs and discussed the procedure including the risks, benefits and alternatives for the proposed anesthesia with the patient or authorized representative who has indicated his/her understanding and acceptance.     Dental advisory given  Plan Discussed with:   Anesthesia Plan Comments:          Anesthesia Quick Evaluation

## 2024-06-10 NOTE — Telephone Encounter (Signed)
 Pharmacy Patient Advocate Encounter   Received notification from Physician's Office that prior authorization for repatha  is required/requested.   Insurance verification completed.   The patient is insured through [facility] ADVANT[employer].   Per test claim: PA required; PA submitted to above mentioned insurance via Latent Key/confirmation #/EOC [id number] Status is pending

## 2024-06-10 NOTE — Telephone Encounter (Signed)
 Too soon to submit- will retime for a later date:

## 2024-06-11 ENCOUNTER — Other Ambulatory Visit: Payer: Self-pay

## 2024-06-11 ENCOUNTER — Encounter (HOSPITAL_COMMUNITY): Payer: Self-pay | Admitting: Cardiovascular Disease

## 2024-06-11 ENCOUNTER — Ambulatory Visit (HOSPITAL_COMMUNITY): Payer: Self-pay | Admitting: Anesthesiology

## 2024-06-11 ENCOUNTER — Encounter (HOSPITAL_COMMUNITY)
Admission: RE | Disposition: A | Discharge status: Home / Self Care | Payer: Self-pay | Source: Home / Self Care | Attending: Cardiovascular Disease

## 2024-06-11 ENCOUNTER — Ambulatory Visit (HOSPITAL_COMMUNITY)
Admission: RE | Admit: 2024-06-11 | Discharge: 2024-06-11 | Disposition: A | Discharge status: Home / Self Care | Attending: Cardiovascular Disease | Admitting: Cardiovascular Disease

## 2024-06-11 ENCOUNTER — Ambulatory Visit (HOSPITAL_BASED_OUTPATIENT_CLINIC_OR_DEPARTMENT_OTHER): Payer: Self-pay | Admitting: Anesthesiology

## 2024-06-11 DIAGNOSIS — K219 Gastro-esophageal reflux disease without esophagitis: Secondary | ICD-10-CM | POA: Insufficient documentation

## 2024-06-11 DIAGNOSIS — E66811 Obesity, class 1: Secondary | ICD-10-CM | POA: Insufficient documentation

## 2024-06-11 DIAGNOSIS — Z6834 Body mass index (BMI) 34.0-34.9, adult: Secondary | ICD-10-CM | POA: Insufficient documentation

## 2024-06-11 DIAGNOSIS — I4819 Other persistent atrial fibrillation: Secondary | ICD-10-CM | POA: Insufficient documentation

## 2024-06-11 DIAGNOSIS — Z7901 Long term (current) use of anticoagulants: Secondary | ICD-10-CM | POA: Insufficient documentation

## 2024-06-11 DIAGNOSIS — D6869 Other thrombophilia: Secondary | ICD-10-CM | POA: Insufficient documentation

## 2024-06-11 DIAGNOSIS — I4891 Unspecified atrial fibrillation: Secondary | ICD-10-CM

## 2024-06-11 DIAGNOSIS — I10 Essential (primary) hypertension: Secondary | ICD-10-CM

## 2024-06-11 DIAGNOSIS — G4733 Obstructive sleep apnea (adult) (pediatric): Secondary | ICD-10-CM | POA: Insufficient documentation

## 2024-06-11 DIAGNOSIS — I2510 Atherosclerotic heart disease of native coronary artery without angina pectoris: Secondary | ICD-10-CM

## 2024-06-11 DIAGNOSIS — Z95818 Presence of other cardiac implants and grafts: Secondary | ICD-10-CM | POA: Diagnosis not present

## 2024-06-11 DIAGNOSIS — Z79899 Other long term (current) drug therapy: Secondary | ICD-10-CM | POA: Diagnosis not present

## 2024-06-11 HISTORY — PX: CARDIOVERSION: EP1203

## 2024-06-11 LAB — GLUCOSE, CAPILLARY: Glucose-Capillary: 82 mg/dL (ref 70–99)

## 2024-06-11 SURGERY — CARDIOVERSION (CATH LAB)
Anesthesia: General

## 2024-06-11 MED ORDER — SODIUM CHLORIDE 0.9 % IV SOLN: INTRAVENOUS | Status: DC

## 2024-06-11 MED ORDER — PROPOFOL 10 MG/ML IV BOLUS
INTRAVENOUS | Status: DC | PRN
Start: 2024-06-11 — End: 2024-06-11
  Administered 2024-06-11: 60 mg via INTRAVENOUS

## 2024-06-11 MED ORDER — LIDOCAINE 2% (20 MG/ML) 5 ML SYRINGE
INTRAMUSCULAR | Status: DC | PRN
  Administered 2024-06-11: 60 mg via INTRAVENOUS

## 2024-06-11 SURGICAL SUPPLY — 1 items: PAD DEFIB RADIO PHYSIO CONN (PAD) ×1 IMPLANT

## 2024-06-11 NOTE — Op Note (Signed)
 Procedure: Electrical Cardioversion Indications:  Atrial Fibrillation  Procedure Details:  Consent: Risks of procedure as well as the alternatives and risks of each were explained to the (patient/caregiver).  Consent for procedure obtained.  Time Out: Verified patient identification, verified procedure, site/side was marked, verified correct patient position, special equipment/implants available, medications/allergies/relevent history reviewed, required imaging and test results available.  Performed  Patient placed on cardiac monitor, pulse oximetry, supplemental oxygen as necessary.  Sedation given: propofol  60 mg IV, Dr. Jefm Pacer pads placed anterior and posterior chest.  Cardioverted 1 time(s).  Cardioversion with synchronized biphasic 200J shock.  Evaluation: Findings: Post procedure EKG shows: NSR Complications: None Patient did tolerate procedure well.  Time Spent Directly with the Patient:  30 minutes   Amy Williamson 06/11/2024, 12:57 PM

## 2024-06-11 NOTE — Transfer of Care (Signed)
 Immediate Anesthesia Transfer of Care Note  Patient: Amy Williamson  Procedure(s) Performed: CARDIOVERSION  Patient Location: Cath Lab  Anesthesia Type:General  Level of Consciousness: drowsy  Airway & Oxygen Therapy: Patient Spontanous Breathing and Patient connected to nasal cannula oxygen  Post-op Assessment: Report given to RN, Post -op Vital signs reviewed and stable, and Patient moving all extremities X 4  Post vital signs: Reviewed and stable  Last Vitals:  Vitals Value Taken Time  BP 116/74   Temp    Pulse 96   Resp 24   SpO2 93       Last Pain:  Vitals:   06/11/24 1054  TempSrc: Temporal         Complications: No notable events documented.

## 2024-06-11 NOTE — Interval H&P Note (Signed)
 History and Physical Interval Note:  06/11/2024 11:42 AM  Amy Williamson Amber  has presented today for surgery, with the diagnosis of AFIB.  The various methods of treatment have been discussed with the patient and family. After consideration of risks, benefits and other options for treatment, the patient has consented to  Procedure(s): CARDIOVERSION (N/A) as a surgical intervention.  The patient's history has been reviewed, patient examined, no change in status, stable for surgery.  I have reviewed the patient's chart and labs.  Questions were answered to the patient's satisfaction.     [name]

## 2024-06-11 NOTE — Anesthesia Postprocedure Evaluation (Signed)
 Anesthesia Post Note  Patient: Amy Williamson  Procedure(s) Performed: CARDIOVERSION     Patient location during evaluation: Cath Lab Anesthesia Type: General Level of consciousness: awake and alert Pain management: pain level controlled Vital Signs Assessment: post-procedure vital signs reviewed and stable Respiratory status: spontaneous breathing, nonlabored ventilation, respiratory function stable and patient connected to nasal cannula oxygen Cardiovascular status: blood pressure returned to baseline and stable Postop Assessment: no apparent nausea or vomiting Anesthetic complications: no   No notable events documented.  Last Vitals:  Vitals:   06/11/24 1256 06/11/24 1316  BP: 116/74 113/73  Pulse: 97   Resp: (!) 23   Temp:    SpO2: 95%     Last Pain:  Vitals:   06/11/24 1054  TempSrc: Temporal                 Garnette DELENA Gab

## 2024-06-13 ENCOUNTER — Ambulatory Visit: Payer: Self-pay | Admitting: Cardiovascular Disease

## 2024-06-17 ENCOUNTER — Encounter (HOSPITAL_COMMUNITY): Payer: Self-pay

## 2024-06-18 ENCOUNTER — Other Ambulatory Visit: Payer: Self-pay | Admitting: Internal Medicine

## 2024-06-22 ENCOUNTER — Other Ambulatory Visit (HOSPITAL_COMMUNITY): Payer: Self-pay

## 2024-06-22 NOTE — Telephone Encounter (Signed)
 We can't submit the renewal just yet, but we can try again after 07/23/24

## 2024-06-23 ENCOUNTER — Ambulatory Visit

## 2024-06-23 ENCOUNTER — Ambulatory Visit (HOSPITAL_COMMUNITY)
Admission: RE | Admit: 2024-06-23 | Discharge: 2024-06-23 | Disposition: A | Source: Ambulatory Visit | Attending: Physician Assistant | Admitting: Physician Assistant

## 2024-06-23 ENCOUNTER — Telehealth (HOSPITAL_COMMUNITY): Payer: Self-pay | Admitting: Pharmacy Technician

## 2024-06-23 ENCOUNTER — Other Ambulatory Visit (HOSPITAL_COMMUNITY): Payer: Self-pay | Admitting: Nurse Practitioner

## 2024-06-23 ENCOUNTER — Encounter (HOSPITAL_COMMUNITY): Payer: Self-pay | Admitting: Cardiology

## 2024-06-23 ENCOUNTER — Inpatient Hospital Stay (HOSPITAL_COMMUNITY)
Admission: AD | Admit: 2024-06-23 | Discharge: 2024-06-26 | DRG: 309 | Disposition: A | Source: Ambulatory Visit | Attending: Internal Medicine | Admitting: Internal Medicine

## 2024-06-23 ENCOUNTER — Other Ambulatory Visit (HOSPITAL_COMMUNITY): Payer: Self-pay

## 2024-06-23 ENCOUNTER — Other Ambulatory Visit: Payer: Self-pay

## 2024-06-23 VITALS — BP 158/70 | HR 78 | Ht 60.0 in | Wt 180.0 lb

## 2024-06-23 DIAGNOSIS — E7800 Pure hypercholesterolemia, unspecified: Secondary | ICD-10-CM | POA: Diagnosis not present

## 2024-06-23 DIAGNOSIS — I700 Atherosclerosis of aorta: Secondary | ICD-10-CM | POA: Diagnosis not present

## 2024-06-23 DIAGNOSIS — M479 Spondylosis, unspecified: Secondary | ICD-10-CM | POA: Diagnosis not present

## 2024-06-23 DIAGNOSIS — G4733 Obstructive sleep apnea (adult) (pediatric): Secondary | ICD-10-CM | POA: Diagnosis not present

## 2024-06-23 DIAGNOSIS — I10 Essential (primary) hypertension: Secondary | ICD-10-CM

## 2024-06-23 DIAGNOSIS — M19011 Primary osteoarthritis, right shoulder: Secondary | ICD-10-CM | POA: Diagnosis not present

## 2024-06-23 DIAGNOSIS — Z888 Allergy status to other drugs, medicaments and biological substances status: Secondary | ICD-10-CM | POA: Diagnosis not present

## 2024-06-23 DIAGNOSIS — Z7901 Long term (current) use of anticoagulants: Secondary | ICD-10-CM | POA: Diagnosis not present

## 2024-06-23 DIAGNOSIS — I4819 Other persistent atrial fibrillation: Secondary | ICD-10-CM | POA: Diagnosis present

## 2024-06-23 DIAGNOSIS — Z881 Allergy status to other antibiotic agents status: Secondary | ICD-10-CM | POA: Diagnosis not present

## 2024-06-23 DIAGNOSIS — I4891 Unspecified atrial fibrillation: Secondary | ICD-10-CM | POA: Diagnosis not present

## 2024-06-23 DIAGNOSIS — Z886 Allergy status to analgesic agent status: Secondary | ICD-10-CM | POA: Diagnosis not present

## 2024-06-23 DIAGNOSIS — Z95818 Presence of other cardiac implants and grafts: Secondary | ICD-10-CM | POA: Diagnosis not present

## 2024-06-23 DIAGNOSIS — Z79899 Other long term (current) drug therapy: Secondary | ICD-10-CM | POA: Diagnosis not present

## 2024-06-23 DIAGNOSIS — M19012 Primary osteoarthritis, left shoulder: Secondary | ICD-10-CM | POA: Diagnosis not present

## 2024-06-23 DIAGNOSIS — D6869 Other thrombophilia: Secondary | ICD-10-CM | POA: Diagnosis not present

## 2024-06-23 LAB — BASIC METABOLIC PANEL WITH GFR
BUN/Creatinine Ratio: 25 (ref 12–28)
BUN: 20 mg/dL (ref 8–27)
CO2: 27 mmol/L (ref 20–29)
Calcium: 9.8 mg/dL (ref 8.7–10.3)
Chloride: 103 mmol/L (ref 96–106)
Creatinine, Ser: 0.81 mg/dL (ref 0.57–1.00)
Glucose: 112 mg/dL — ABNORMAL HIGH (ref 70–99)
Potassium: 4.8 mmol/L (ref 3.5–5.2)
Sodium: 142 mmol/L (ref 134–144)
eGFR: 76 mL/min/1.73 (ref >59)

## 2024-06-23 LAB — MAGNESIUM: Magnesium: 1.9 mg/dL (ref 1.6–2.3)

## 2024-06-23 MED ORDER — VITAMIN B-12 100 MCG PO TABS
1000.0000 ug | ORAL_TABLET | Freq: Every morning | ORAL | Status: DC
  Filled 2024-06-23 (×3): qty 10

## 2024-06-23 MED ORDER — SODIUM CHLORIDE 0.9% FLUSH: 3.0000 mL | INTRAVENOUS | Status: DC | PRN

## 2024-06-23 MED ORDER — VITAMIN D3 25 MCG (1000 UNIT) PO TABS
1000.0000 [IU] | ORAL_TABLET | Freq: Every morning | ORAL | Status: DC
  Administered 2024-06-24 – 2024-06-26 (×3): 1000 [IU] via ORAL
  Filled 2024-06-23 (×6): qty 1

## 2024-06-23 MED ORDER — BISACODYL 5 MG PO TBEC
5.0000 mg | DELAYED_RELEASE_TABLET | Freq: Every day | ORAL | Status: DC | PRN
  Administered 2024-06-24: 5 mg via ORAL
  Filled 2024-06-23: qty 1

## 2024-06-23 MED ORDER — SODIUM CHLORIDE 0.9 % IV SOLN: 250.0000 mL | INTRAVENOUS | Status: AC | PRN

## 2024-06-23 MED ORDER — PANTOPRAZOLE SODIUM 40 MG PO TBEC
40.0000 mg | DELAYED_RELEASE_TABLET | Freq: Every day | ORAL | Status: DC
  Administered 2024-06-24 – 2024-06-26 (×3): 40 mg via ORAL
  Filled 2024-06-23 (×3): qty 1

## 2024-06-23 MED ORDER — DOFETILIDE 500 MCG PO CAPS
500.0000 ug | ORAL_CAPSULE | Freq: Two times a day (BID) | ORAL | Status: DC
  Administered 2024-06-23 – 2024-06-26 (×6): 500 ug via ORAL
  Filled 2024-06-23 (×6): qty 1

## 2024-06-23 MED ORDER — MAGNESIUM SULFATE 2 GM/50ML IV SOLN
2.0000 g | Freq: Once | INTRAVENOUS | Status: AC
  Administered 2024-06-23: 2 g via INTRAVENOUS
  Filled 2024-06-23: qty 50

## 2024-06-23 MED ORDER — FUROSEMIDE 20 MG PO TABS: 20.0000 mg | ORAL_TABLET | Freq: Every day | ORAL | Status: DC | PRN

## 2024-06-23 MED ORDER — SODIUM CHLORIDE 0.9% FLUSH
3.0000 mL | Freq: Two times a day (BID) | INTRAVENOUS | Status: DC
  Administered 2024-06-24 – 2024-06-25 (×2): 3 mL via INTRAVENOUS

## 2024-06-23 MED ORDER — ACETAMINOPHEN 500 MG PO TABS
1000.0000 mg | ORAL_TABLET | Freq: Three times a day (TID) | ORAL | Status: DC
  Administered 2024-06-23 – 2024-06-26 (×6): 1000 mg via ORAL
  Filled 2024-06-23 (×6): qty 2

## 2024-06-23 MED ORDER — DILTIAZEM HCL ER COATED BEADS 120 MG PO CP24
120.0000 mg | ORAL_CAPSULE | Freq: Every day | ORAL | Status: DC
  Administered 2024-06-24 – 2024-06-26 (×3): 120 mg via ORAL
  Filled 2024-06-23 (×3): qty 1

## 2024-06-23 MED ORDER — APIXABAN 5 MG PO TABS
5.0000 mg | ORAL_TABLET | Freq: Two times a day (BID) | ORAL | Status: DC
  Administered 2024-06-23 – 2024-06-26 (×6): 5 mg via ORAL
  Filled 2024-06-23 (×6): qty 1

## 2024-06-23 NOTE — H&P (Addendum)
 Primary Care Physician: Rollene Almarie LABOR, MD Primary Cardiologist: None Electrophysiologist: OLE ONEIDA HOLTS, MD  Referring Physician: Josefa Beauvais, NP   Amy Williamson is a 73 y.o. female with a history of HTN, aortic atherosclerosis, OSA, HLD, atrial fibrillation who presents for follow up in the [facility].  The patient was initially diagnosed with atrial fibrillation 06/2020 and underwent DCCV on 07/28/20. She presented to the ED 09/26/21 with symptoms palpitations and underwent DCCV but unfortunately returned to the ED on 10/06/21 and had another DCCV. She has symptoms of fatigue while in afib and for several days afterwards. She did have a steroid injection for bursitis a week before her first ED visit. She was started on flecainide  but had intolerable side effects. She did have a breakthrough episode of afib 12/12/21 and took a PRN dose of flecainide  which converted her to SR.    S/p afib ablation 03/30/22 with Dr Holts. S/p Watchman implant 06/28/22. She presented to the emergency department July 04, 2023 with rapidly conducted atrial flutter. Eliquis  was restarted and she was set up for outpatient cardioversion. She was started on amiodarone  and underwent DCCV on 07/11/23. She then underwent repeat ablation, afib and flutter, with Dr Holts on 11/28/23. Amiodarone  was discontinue prior to ablation.  During previous visit she presented in symptomatic atrial fibrillation and underwent DCCV with conversion to sinus rhythm on 06/11/2024. During visit she also expressed interest in pursuing dofetilide for rhythm control.   Amy Williamson presents today for pre-Tikosyn admission visit with her husband.  She is still maintaining sinus rhythm by EKG today since her DCCV.  She has been compliant with her medications and reports that her last dose of Zepbound  was November 20.  She has not missed any doses of her Eliquis  since her previous visit.  During today's visit we  discussed her upcoming visit including follow-up visits and subsequent lab work.  She had all questions answered to her satisfaction and we Daryle Amis proceed with admission later today once a bed is available.  Today, she denies symptoms of palpitations, chest pain, shortness of breath, orthopnea, PND, lower extremity edema, dizziness, presyncope, syncope, snoring, daytime somnolence, bleeding, or neurologic sequela. The patient is tolerating medications without difficulties and is otherwise without complaint today.    Atrial Fibrillation Risk Factors:   she does have symptoms or diagnosis of sleep apnea. she does not have a history of rheumatic fever. she does not have a history of alcohol use. The patient does not have a history of early familial atrial fibrillation or other arrhythmias.     Atrial Fibrillation Management history:   Previous antiarrhythmic drugs: flecainide , amiodarone  (did not tolerate well)  Previous cardioversions: 07/28/20, 09/26/21, 10/06/21, 07/11/23 Previous ablations: 03/30/22, 11/28/23 Anticoagulation history: Eliquis   ROS- All systems are reviewed and negative except as per the HPI above.  Past Medical History:  Diagnosis Date   Anticoagulant long-term use    eliquis  --- managed by cardiology   Arthritis    BACK AND SHOULDERS   Depressive disorder, not elsewhere classified    Essential hypertension    followed by pcp and cardiology   Frequency of urination    GERD (gastroesophageal reflux disease)    Headache    migraines when younger   History of dysphagia 2015   s/p egd w/ dilation, pt did not have stricture   History of gastritis    OSA (obstructive sleep apnea)    followed by dr t. turner---  study  in epic 09-03-2020 moderate osa ,  pt scheduled for cpap titration 12-08-2020- pt wears oral device, cannot wear CPAP   PAF (paroxysmal atrial fibrillation) [facility])    cardiologist-- dr christella. 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 Cindie   Pure hypercholesterolemia     No current facility-administered medications for this encounter.    Physical Exam: LMP 07/23/2004   GEN: Well nourished, well developed in no acute distress NECK: No JVD; No carotid bruits CARDIAC: Regular rate and rhythm, no murmurs, rubs, gallops RESPIRATORY:  Clear to auscultation without rales, wheezing or rhonchi  ABDOMEN: Soft, non-tender, non-distended EXTREMITIES:  No edema; No deformity    Wt Readings from Last 3 Encounters:  06/23/24 81.6 kg  06/11/24 80.7 kg  06/05/24 80.3 kg     EKG today demonstrates:   EKG Interpretation Date/Time:    Ventricular Rate:    PR Interval:    QRS Duration:    QT Interval:    QTC Calculation:   R Axis:      Text Interpretation:          Echo 01/16/24 demonstrated: 1. Left ventricular ejection fraction, by estimation, is 60 to 65%. The  left ventricle has normal function. The left ventricle has no regional  wall motion abnormalities. Left ventricular diastolic parameters are  consistent with Grade II diastolic dysfunction (pseudonormalization).   2. Right ventricular systolic function is normal. The right ventricular  size is normal. There is normal pulmonary artery systolic pressure. The  estimated right ventricular systolic pressure is 32.6 mmHg.   3. The mitral valve is normal in structure. Mild mitral valve  regurgitation. No evidence of mitral stenosis.   4. The aortic valve is normal in structure. Aortic valve regurgitation is  not visualized. No aortic stenosis is present.   5. The inferior vena cava is normal in size  with greater than 50%  respiratory variability, suggesting right atrial pressure of 3 mmHg.    CHA2DS2-VASc Score = 3  The patient's score is based upon: CHF History: 0 HTN History: 1 Diabetes History: 0 Stroke History: 0 Vascular Disease History: 0 Age Score: 1 Gender Score: 1       ASSESSMENT AND PLAN: Persistent Atrial Fibrillation (ICD10:  I48.19) The patient's CHA2DS2-VASc score is 3, indicating a 3.2% annual risk of stroke.   S/p afib ablation 03/30/22, afib and flutter ablation 11/28/23  Patient presents for dofetilide admission Continue Eliquis  5 mg BID, states no missed doses in the last 3 weeks. No recent benadryl use PharmD has screened medications for QT prolonging agents.  QTc in SR 408 ms Labs today show creatinine at 0.8, K+ 4.8 and mag 1.9, CrCl calculated at 79.47 mL/min We Eleasha Cataldo start Tikosyn at 500 mcg BID based on Creatine Clearance  Secondary Hypercoagulable State (ICD10:  D68.69) The patient is at significant risk for stroke/thromboembolism based upon her CHA2DS2-VASc Score of 3.  Continue Apixaban  (Eliquis ).   Obesity There is no height or weight on file to calculate BMI.  Encouraged lifestyle modification   OSA  Encouraged nightly use of oral device.  HTN - Blood pressure today was mildly elevated but controlled at home in the 130s. - Continue Cardizem  120 mg daily    Patient pending available bed later today for Tikosyn admission and Keymarion Bearman follow-up in 1 week postadmission.   [facility] [address] [place], [place] [address] [phone]  ADEND: See AFib clinic note above for H&P Patient has arrived for planned Tikosyn load OAC is Eliquis , she reports excellent medication compliance with no missed doses of her Eliquis , since restart prior to her last DCCV K+ 4.8 Mag 1.9 Creat 0.81 (calc CrCl is 78), appropriate for dose QTc is acceptable to start drug She feels well, we discussed tikosyn loading process  w/expectation of discharge Friday afternoon  Charlies Arthur, PA-C   Amy Williamson was seen by me today along with Charlies Arthur. I have personally performed an evaluation on this patient.  My findings are as follows: 73 y.o. female patient with a past history as above.  She has had atrial fibrillation ablation x 2.  She has also had atrial flutter ablation.  She is post watchman implant.  She presented to the hospital in sinus rhythm.  She has had cardioversion in the past due to atrial flutter after her second ablation.  She currently feels well.  She has no acute complaints at this time.  Data: EKG(s) and pertinent labs, studies, etc were personally reviewed and interpreted by me:  EKG, labs, telemetry Otherwise, I agree with data as outlined by the advanced practice provider.  Exam performed by me: Gen: No acute distress Neck: No JVD Cardiac: Regular Lungs: Normal work of breathing Extremities: No edema  My Assessment and Plan: 1.  Persistent atrial fibrillation: Post ablation x 2.  She is in sinus rhythm today but has required cardioversion after her second ablation.  Admission for dofetilide load.  QTc acceptable.  Loxley Cibrian plan to start 500 mcg twice daily tonight.  EKG after each dose.  She does into atrial fibrillation, Ryan Ogborn plan for cardioversion Thursday.  2.  Secondary hypercoagulable state: Continue Eliquis   3.  Obstructive sleep apnea: Uses oral device  4.  Hypertension: Continue home medications  5.  Obesity: Lifestyle modification encouraged  Signed,  Brandi Armato Gladis Norton, MD  06/23/2024 5:20 PM

## 2024-06-23 NOTE — Progress Notes (Signed)
 Pharmacy: Dofetilide (Tikosyn) - Initial Consult Assessment and Electrolyte Replacement  Pharmacy consulted to assist in monitoring and replacing electrolytes in this 73 y.o. female admitted on 06/23/2024 undergoing dofetilide initiation. First dofetilide dose: 500mcg at 20:00 on 06/23/24  Assessment:  Patient Exclusion Criteria: If any screening criteria checked as Yes, then  patient  should NOT receive dofetilide until criteria item is corrected.  If "Yes" please indicate correction plan.  YES  NO Patient  Exclusion Criteria Correction Plan/Comments   []   [x]   Baseline QTc interval is greater than or equal to 440 msec. IF above YES box checked dofetilide contraindicated unless patient has ICD; then may proceed if QTc 500-550 msec or with known ventricular conduction abnormalities may proceed with QTc 550-600 msec. QTc =      []   [x]   Patient is known or suspected to have a digoxin level greater than 2 ng/ml: No results found for: DIGOXIN     []   [x]   Creatinine clearance less than 20 ml/min (calculated using Cockcroft-Gault, actual body weight and serum creatinine): Estimated Creatinine Clearance: 57.6 mL/min (by C-G formula based on SCr of 0.81 mg/dL).     []   [x]  Patient has received drugs known to prolong the QT intervals within the last 48 hour (examples: phenothiazines, tricyclics or tetracyclic antidepressants, macrolides, 1st generation H-1 antihistamines (especially diphenhydramine), fluoroquinolones, azoles, ondansetron , metoclopramide, promethazine ).   Updated information on QT prolonging agents is available to be searched on the following database:QT prolonging agents -If SSRI or antihistamine needed, preferred options are sertraline and loratadine respectively     []   [x]  Patient received a dose of a thiazide diuretic in the last 48 hours [including hydrochlorothiazide (Oretic) alone or in any combination including triamterene (Dyazide, Maxzide)].    []   [x]   Patient received a medication known to increase dofetilide plasma concentrations prior to initial dofetilide dose:  Trimethoprim  (Primsol, Proloprim ) in the last 36 hours Verapamil (Calan, Verelan) in the last 36 hours or a sustained release dose in the last 72 hours Megestrol (Megace) in the last 5 days  Cimetidine (Tagamet) in the last 6 hours Ketoconazole (Nizoral) in the last 24 hours Itraconazole (Sporanox) in the last 48 hours  Prochlorperazine (Compazine) in the last 36 hours     []   [x]   Patient is known to have a history of torsades de pointes; congenital or acquired long QT syndromes.    []   [x]   Patient has received a Class 1 and Class 3 antiarrhythmic with less than 2 half-lives since last dose. (Disopyramide, Quinidine, Procainamide, Lidocaine , Mexiletine, Flecainide , Propafenone, Sotalol, Dronedarone)    []   [x]   Patient has received amiodarone  therapy in the past 3 months or amiodarone  level is greater than 0.3 ng/ml.    Labs:    Component Value Date/Time   K 4.8 06/23/2024 0910   MG 1.9 06/23/2024 0910     Plan: [facility] Calculated CrCl  Dose q12h  [x]  > 60 ml/min 500 mcg  []  40-60 ml/min 250 mcg  []  20-40 ml/min 125 mcg   [x]   Physician selected initial dose within range recommended for patients level of renal function - will monitor for response.  []   Physician selected initial dose outside of range recommended for patients level of renal function - will discuss if the dose should be altered at this time.   Patient has been appropriately anticoagulated with Eliquis  5mg  BID.  Potassium: K >/= 4: Appropriate to initiate Tikosyn, no replacement needed    Magnesium:  Mg 1.8-2: Give Mg 2 gm IV x1 to prevent Mg from dropping below 1.8 - do not need to recheck Mg. Appropriate to initiate Tikosyn   Thank you for allowing pharmacy to participate in this patient's care   Rocky Slade, PharmD, BCPS 06/23/2024  3:11 PM

## 2024-06-23 NOTE — TOC CM/SW Note (Signed)
 Transition of Care [facility]) - Inpatient Brief Assessment   Patient Details  Name: Amy Williamson MRN: [id number] Date of Birth: 1951/01/06  Transition of Care [facility]) CM/SW Contact:    Sudie Erminio Deems, RN Phone Number: 06/23/2024, 4:18 PM   Clinical Narrative: Patient presented for Tikosyn Load. Inpatient Case Manager spoke with the patient regarding co pay cost. Patient is agreeable to cost and would like to have the initial Rx filled via [facility] and the Rx refills 90 day supply escribed [facility] on [address]. No further needs identified at this time.   Transition of Care Asessment: Insurance and Status: Insurance coverage has been reviewed Patient has primary care physician: Yes Home environment has been reviewed: reviewed Prior level of function:: independent Prior/Current Home Services: No current home services Social Drivers of Health Review: SDOH reviewed no interventions necessary Readmission risk has been reviewed: Yes Transition of care needs: no transition of care needs at this time

## 2024-06-23 NOTE — Progress Notes (Signed)
 Primary Care Physician: Rollene Almarie LABOR, MD Primary Cardiologist: None Electrophysiologist: OLE ONEIDA HOLTS, MD  Referring Physician: Josefa Beauvais, NP   Amy Williamson is a 73 y.o. female with a history of HTN, aortic atherosclerosis, OSA, HLD, atrial fibrillation who presents for follow up in the [facility].  The patient was initially diagnosed with atrial fibrillation 06/2020 and underwent DCCV on 07/28/20. She presented to the ED 09/26/21 with symptoms palpitations and underwent DCCV but unfortunately returned to the ED on 10/06/21 and had another DCCV. She has symptoms of fatigue while in afib and for several days afterwards. She did have a steroid injection for bursitis a week before her first ED visit. She was started on flecainide  but had intolerable side effects. She did have a breakthrough episode of afib 12/12/21 and took a PRN dose of flecainide  which converted her to SR.    S/p afib ablation 03/30/22 with Dr Holts. S/p Watchman implant 06/28/22. She presented to the emergency department July 04, 2023 with rapidly conducted atrial flutter. Eliquis  was restarted and she was set up for outpatient cardioversion. She was started on amiodarone  and underwent DCCV on 07/11/23. She then underwent repeat ablation, afib and flutter, with Dr Holts on 11/28/23. Amiodarone  was discontinue prior to ablation.  During previous visit she presented in symptomatic atrial fibrillation and underwent DCCV with conversion to sinus rhythm on 06/11/2024. During visit she also expressed interest in pursuing dofetilide for rhythm control.   Ms. Amy Williamson presents today for pre-Tikosyn admission visit with her husband.  She is still maintaining sinus rhythm by EKG today since her DCCV.  She has been compliant with her medications and reports that her last dose of Zepbound  was November 20.  She has not missed any doses of her Eliquis  since her previous visit.  During today's visit we  discussed her upcoming visit including follow-up visits and subsequent lab work.  She had all questions answered to her satisfaction and we will proceed with admission later today once a bed is available.  Today, she denies symptoms of palpitations, chest pain, shortness of breath, orthopnea, PND, lower extremity edema, dizziness, presyncope, syncope, snoring, daytime somnolence, bleeding, or neurologic sequela. The patient is tolerating medications without difficulties and is otherwise without complaint today.    Atrial Fibrillation Risk Factors:   she does have symptoms or diagnosis of sleep apnea. she does not have a history of rheumatic fever. she does not have a history of alcohol use. The patient does not have a history of early familial atrial fibrillation or other arrhythmias.     Atrial Fibrillation Management history:   Previous antiarrhythmic drugs: flecainide , amiodarone  (did not tolerate well)  Previous cardioversions: 07/28/20, 09/26/21, 10/06/21, 07/11/23 Previous ablations: 03/30/22, 11/28/23 Anticoagulation history: Eliquis   ROS- All systems are reviewed and negative except as per the HPI above.  Past Medical History:  Diagnosis Date   Anticoagulant long-term use    eliquis  --- managed by cardiology   Arthritis    BACK AND SHOULDERS   Depressive disorder, not elsewhere classified    Essential hypertension    followed by pcp and cardiology   Frequency of urination    GERD (gastroesophageal reflux disease)    Headache    migraines when younger   History of dysphagia 2015   s/p egd w/ dilation, pt did not have stricture   History of gastritis    OSA (obstructive sleep apnea)    followed by dr t. turner---  study  in epic 09-03-2020 moderate osa ,  pt scheduled for cpap titration 12-08-2020- pt wears oral device, cannot wear CPAP   PAF (paroxysmal atrial fibrillation) [facility])    cardiologist-- dr christella. 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 Cindie   Pure hypercholesterolemia     Current Outpatient Medications  Medication Sig Dispense Refill   acetaminophen  (TYLENOL ) 650 MG CR tablet Take 1,300 mg by mouth in the morning and at bedtime.     apixaban  (ELIQUIS ) 5 MG TABS tablet Take 1 tablet (5 mg total) by mouth 2 (two) times daily. 60 tablet 3   benzonatate  (TESSALON ) 100 MG capsule Take 100 mg by mouth 3 (three) times daily as needed for cough. (Patient taking differently: Take 100 mg by mouth as needed for cough.)     bisacodyl  (DULCOLAX) 5 MG EC tablet Take 5 mg by mouth daily as needed for moderate constipation. (Patient taking differently: Take 5 mg by mouth as needed for moderate constipation.)     Cholecalciferol (VITAMIN D3 PO) Take 1,000 Units by mouth in the morning.     Cyanocobalamin  (B-12 PO) Take 1,000 mcg by mouth in the morning.     diltiazem  (CARDIZEM  CD) 120 MG 24 hr capsule Take 1 capsule (120 mg total) by mouth daily. 30 capsule 3   Evolocumab  (REPATHA  SURECLICK) 140 MG/ML SOAJ Inject 140 mg into the skin every 14 (fourteen) days. 6 mL 3   furosemide  (LASIX ) 20 MG tablet Take 1 tablet (20 mg total) by mouth daily as needed. (Patient taking differently: Take 20 mg by mouth as needed.) 30 tablet 11   pantoprazole  (PROTONIX ) 40 MG tablet TAKE 1 TABLET BY MOUTH EVERY DAY 90 tablet 4   amLODipine  (NORVASC ) 5 MG tablet Take 1 tablet (5 mg total) by mouth daily. Hold while on diltiazem  (Patient not taking: Reported on 06/23/2024)     tirzepatide  (ZEPBOUND ) 10 MG/0.5ML Pen Inject 10 mg into the skin once a week. (Patient not taking: Reported on 06/23/2024) 2 mL 0    No current facility-administered medications for this encounter.    Physical Exam: BP (!) 158/70   Pulse 78   Ht 5' (1.524 m)   Wt 81.6 kg   LMP 07/23/2004   BMI 35.15 kg/m   GEN: Well nourished, well developed in no acute distress NECK: No JVD; No carotid bruits CARDIAC: Regular rate and rhythm, no murmurs, rubs, gallops RESPIRATORY:  Clear to auscultation without rales, wheezing or rhonchi  ABDOMEN: Soft, non-tender, non-distended EXTREMITIES:  No edema; No deformity    Wt Readings from Last 3 Encounters:  06/23/24 81.6 kg  06/11/24 80.7 kg  06/05/24 80.3 kg     EKG today demonstrates:   EKG Interpretation Date/Time:  Tuesday June 23 2024 09:03:52 EST Ventricular Rate:  78 PR Interval:  202 QRS Duration:  74 QT Interval:  358 QTC Calculation: 408 R Axis:   0  Text Interpretation: Normal sinus rhythm Confirmed by Wyn Manus [phone]) on 06/23/2024 10:51:38 AM        Echo 01/16/24 demonstrated: 1. Left ventricular ejection fraction, by  estimation, is 60 to 65%. The  left ventricle has normal function. The left ventricle has no regional  wall motion abnormalities. Left ventricular diastolic parameters are  consistent with Grade II diastolic dysfunction (pseudonormalization).   2. Right ventricular systolic function is normal. The right ventricular  size is normal. There is normal pulmonary artery systolic pressure. The  estimated right ventricular systolic pressure is 32.6 mmHg.   3. The mitral valve is normal in structure. Mild mitral valve  regurgitation. No evidence of mitral stenosis.   4. The aortic valve is normal in structure. Aortic valve regurgitation is  not visualized. No aortic stenosis is present.   5. The inferior vena cava is normal in size with greater than 50%  respiratory variability, suggesting right atrial pressure of 3 mmHg.    CHA2DS2-VASc Score = 3  The patient's score is based upon: CHF History: 0 HTN History: 1 Diabetes  History: 0 Stroke History: 0 Vascular Disease History: 0 Age Score: 1 Gender Score: 1       ASSESSMENT AND PLAN: Persistent Atrial Fibrillation (ICD10:  I48.19) The patient's CHA2DS2-VASc score is 3, indicating a 3.2% annual risk of stroke.   S/p afib ablation 03/30/22, afib and flutter ablation 11/28/23  Patient presents for dofetilide  admission Continue Eliquis  5 mg BID, states no missed doses in the last 3 weeks. No recent benadryl use PharmD has screened medications for QT prolonging agents.  QTc in SR 408 ms Labs today show creatinine at 0.8, K+ 4.8 and mag 1.9, CrCl calculated at 79.47 mL/min We will start Tikosyn  at 500 mcg BID based on Creatine Clearance  Secondary Hypercoagulable State (ICD10:  D68.69) The patient is at significant risk for stroke/thromboembolism based upon her CHA2DS2-VASc Score of 3.  Continue Apixaban  (Eliquis ).   Obesity Body mass index is 35.15 kg/m.  Encouraged lifestyle modification   OSA  Encouraged nightly use of oral device.    HTN - Blood pressure today was mildly elevated but controlled at home in the 130s. - Continue Cardizem  120 mg daily    Patient pending available bed later today for Tikosyn  admission and will follow-up in 1 week postadmission.   [facility] [address] [place], [place] [address] [phone]

## 2024-06-23 NOTE — Telephone Encounter (Signed)
 Patient Product/process Development Scientist completed.    The patient is insured through [employer]/ [employer]. Patient has Medicare and is not eligible for a copay card, but may be able to apply for patient assistance or Medicare RX Payment Plan (Patient Must reach out to their plan, if eligible for payment plan), if available.    Ran test claim for dofetilide (Tikosyn) 500 mcg and the current 30 day co-pay is $0.00.   This test claim was processed through [facility]- copay amounts may vary at other pharmacies due to pharmacy/plan contracts, or as the patient moves through the different stages of their insurance plan.     Reyes Sharps, CPHT Pharmacy Technician Patient Advocate Specialist Lead [facility] Patient Advocate Team Direct Number: [phone]  Fax: [phone]

## 2024-06-24 ENCOUNTER — Telehealth (HOSPITAL_COMMUNITY): Payer: Self-pay

## 2024-06-24 ENCOUNTER — Other Ambulatory Visit (HOSPITAL_COMMUNITY): Payer: Self-pay

## 2024-06-24 DIAGNOSIS — I4819 Other persistent atrial fibrillation: Secondary | ICD-10-CM | POA: Diagnosis not present

## 2024-06-24 LAB — BASIC METABOLIC PANEL WITH GFR
Anion gap: 3 — ABNORMAL LOW (ref 5–15)
BUN: 15 mg/dL (ref 8–23)
CO2: 31 mmol/L (ref 22–32)
Calcium: 8.9 mg/dL (ref 8.9–10.3)
Chloride: 107 mmol/L (ref 98–111)
Creatinine, Ser: 0.8 mg/dL (ref 0.44–1.00)
GFR, Estimated: >60 mL/min (ref >60)
Glucose, Bld: 101 mg/dL — ABNORMAL HIGH (ref 70–99)
Potassium: 4.4 mmol/L (ref 3.5–5.1)
Sodium: 141 mmol/L (ref 135–145)

## 2024-06-24 LAB — MAGNESIUM: Magnesium: 2.4 mg/dL (ref 1.7–2.4)

## 2024-06-24 MED ORDER — POLYETHYLENE GLYCOL 3350 17 G PO PACK
17.0000 g | PACK | Freq: Every day | ORAL | Status: DC | PRN
  Administered 2024-06-24 – 2024-06-25 (×2): 17 g via ORAL
  Filled 2024-06-24 (×2): qty 1

## 2024-06-24 MED ORDER — MELATONIN 3 MG PO TABS
3.0000 mg | ORAL_TABLET | Freq: Every evening | ORAL | Status: DC | PRN
  Administered 2024-06-24 – 2024-06-25 (×2): 3 mg via ORAL
  Filled 2024-06-24 (×3): qty 1

## 2024-06-24 NOTE — Telephone Encounter (Signed)
 Pharmacy Patient Advocate Encounter  Insurance verification completed.    The patient is insured through [employer]/ [employer]. Patient has Medicare and is not eligible for a copay card, but may be able to apply for patient assistance or Medicare RX Payment Plan (Patient Must reach out to their plan, if eligible for payment plan), if available.    Ran test claim for Dofetilide 500mcg and the current 30 day co-pay is $0.  Ran test claim for Eliquis  5mg  and it is refill too soon until 06/28/24  This test claim was processed through [facility]- copay amounts may vary at other pharmacies due to pharmacy/plan contracts, or as the patient moves through the different stages of their insurance plan.

## 2024-06-24 NOTE — Plan of Care (Signed)

## 2024-06-24 NOTE — Progress Notes (Signed)
 Pharmacy: Dofetilide (Tikosyn) - Follow Up Assessment and Electrolyte Replacement  Pharmacy consulted to assist in monitoring and replacing electrolytes in this 73 y.o. female admitted on 06/23/2024 undergoing dofetilide initiation. First dofetilide dose: 500 mcg 12/2 @2000   Labs:    Component Value Date/Time   K 4.4 06/24/2024 0436   MG 2.4 06/24/2024 0436     Plan: Potassium: K >/= 4: No additional supplementation needed  Magnesium: Mg > 2: No additional supplementation needed   Thank you for allowing pharmacy to participate in this patient's care   Maurilio Fila, PharmD Clinical Pharmacist 06/24/2024  7:08 AM

## 2024-06-24 NOTE — Progress Notes (Addendum)
 Rounding Note   Patient Name: Amy Williamson Date of Encounter: 06/24/2024  [facility] Cardiologist: None  EP: Dr. Cindie > Dr. Inocencio  Subjective  Feels well, tolerating drug, no CP, SOB  Scheduled Meds:  acetaminophen   1,000 mg Oral Q8H   apixaban   5 mg Oral BID   cholecalciferol  1,000 Units Oral q AM   diltiazem   120 mg Oral Daily   dofetilide  500 mcg Oral BID   pantoprazole   40 mg Oral Daily   sodium chloride  flush  3 mL Intravenous Q12H   vitamin B-12  1,000 mcg Oral q AM   Continuous Infusions:  sodium chloride      PRN Meds: sodium chloride , bisacodyl , furosemide , melatonin, sodium chloride  flush   Vital Signs  Vitals:   06/23/24 2356 06/23/24 2359 06/24/24 0621 06/24/24 0809  BP: (!) 139/55 (!) 139/55  (!) 142/58  Pulse: 64  61 69  Resp: 18  18 18   Temp:   97.9 F (36.6 C) 97.8 F (36.6 C)  TempSrc:   Oral Oral  SpO2: 100%  99% 99%  Weight:      Height:        Intake/Output Summary (Last 24 hours) at 06/24/2024 0844 Last data filed at 06/23/2024 1725 Gross per 24 hour  Intake 43.44 ml  Output --  Net 43.44 ml      06/23/2024    3:16 PM 06/23/2024    8:49 AM 06/11/2024   10:54 AM  Last 3 Weights  Weight (lbs) 179 lb 8 oz 180 lb 178 lb  Weight (kg) 81.421 kg 81.647 kg 80.74 kg      Telemetry  SR  60's - Personally Reviewed  ECG   SR 65bpm, QTc - Personally Reviewed  Physical Exam  GEN: No acute distress.   Neck: No JVD Cardiac: RRR, no murmurs, rubs, or gallops.  Respiratory: CTA b/l. GI: Soft, nontender, non-distended  MS: No edema; No deformity. Neuro:  Nonfocal  Psych: Normal affect   Labs High Sensitivity Troponin:  No results for input(s): TROPONINIHS in the last 720 hours.   Chemistry Recent Labs  Lab 06/23/24 0910 06/24/24 0436  NA 142 141  K 4.8 4.4  CL 103 107  CO2 27 31  GLUCOSE 112* 101*  BUN 20 15  CREATININE 0.81 0.80  CALCIUM  9.8 8.9  MG 1.9 2.4  GFRNONAA  --  >60  ANIONGAP  --   3*    Lipids No results for input(s): CHOL, TRIG, HDL, LABVLDL, LDLCALC, CHOLHDL in the last 168 hours.  HematologyNo results for input(s): WBC, RBC, HGB, HCT, MCV, MCH, MCHC, RDW, PLT in the last 168 hours. Thyroid  No results for input(s): TSH, FREET4 in the last 168 hours.  BNPNo results for input(s): BNP, PROBNP in the last 168 hours.  DDimer No results for input(s): DDIMER in the last 168 hours.   Radiology  No results found.  Cardiac Studies   01/16/24: TTE 1. Left ventricular ejection fraction, by estimation, is 60 to 65%. The  left ventricle has normal function. The left ventricle has no regional  wall motion abnormalities. Left ventricular diastolic parameters are  consistent with Grade II diastolic  dysfunction (pseudonormalization).   2. Right ventricular systolic function is normal. The right ventricular  size is normal. There is normal pulmonary artery systolic pressure. The  estimated right ventricular systolic pressure is 32.6 mmHg.   3. The mitral valve is normal in structure. Mild mitral valve  regurgitation. No  evidence of mitral stenosis.   4. The aortic valve is normal in structure. Aortic valve regurgitation is  not visualized. No aortic stenosis is present.   5. The inferior vena cava is normal in size with greater than 50%  respiratory variability, suggesting right atrial pressure of 3 mmHg.       Patient Profile   73 y.o. female w/PMHx of HTN, HLD, morbid obesity Aortic atherosclerosis OSA (uses dental appliance) AFib  Arrhythmia/AAD hx AFib Flecainide  2023 > quickly stopped poorly tolerated AFib ablation 03/30/22 Watchman 06/28/22 AFlutter found Dec 2024 > amiodarone  > stopped Jan 2025 (seems patient preference/anxiety associated with the medication) AFib (RIPV required re-isolation) and CTI ablation 11/28/23  Admitted for tikosyn load  Assessment & Plan   Persistent AFib CHA2DS2Vasc is 3, on  Eliquis  Tikosyn load is in progress K+ 4.4 Mag 2.4 Creat 0.80 (stable) QTc stable  Anticipate discharge Friday  2.   HTN Home meds    For questions or updates, please contact [facility] Please consult [email or web address] for contact info under     Signed, Amy Macario Arthur, PA-C  06/24/2024, 8:44 AM     Amy Williamson Amy Williamson was seen by me today along with Amy Williamson. I have personally performed an evaluation on this patient.  My findings are as follows: 73 y.o. female feeling well today.  Remains in sinus rhythm.  Data: EKG(s) and pertinent labs, studies, etc were personally reviewed and interpreted by me:  EKG, telemetry, labs Otherwise, I agree with data as outlined by the advanced practice provider.  Exam performed by me: Gen: No acute Neck: No JVD Cardiac: Regular Lungs: Normal work of Extremities: No edema  My Assessment and Plan:  1.  Persistent atrial fibrillation/flutter: Dofetilide load in progress.  She remains in sinus rhythm.  Feeling well.  QTc remained stable.  Amy Williamson continue current management.  2.  Secondary hypercoagulable state: On Eliquis   3.  Hypertension: Continue home medications  Signed,  Amy Santaana Gladis Norton, MD  06/24/2024 10:32 AM

## 2024-06-24 NOTE — Progress Notes (Signed)
 Post dose EKG is reviewed Maintaining SR, QTc stable Continue Tikosyn Anticipate d/c Friday  Charlies Arthur, PA-C

## 2024-06-25 ENCOUNTER — Encounter (HOSPITAL_COMMUNITY): Payer: Self-pay | Admitting: Anesthesiology

## 2024-06-25 ENCOUNTER — Encounter (HOSPITAL_COMMUNITY): Admission: AD | Disposition: A | Payer: Self-pay | Source: Ambulatory Visit | Attending: Internal Medicine

## 2024-06-25 DIAGNOSIS — I4819 Other persistent atrial fibrillation: Secondary | ICD-10-CM | POA: Diagnosis not present

## 2024-06-25 LAB — BASIC METABOLIC PANEL WITH GFR
Anion gap: 8 (ref 5–15)
BUN: 15 mg/dL (ref 8–23)
CO2: 27 mmol/L (ref 22–32)
Calcium: 9 mg/dL (ref 8.9–10.3)
Chloride: 105 mmol/L (ref 98–111)
Creatinine, Ser: 0.75 mg/dL (ref 0.44–1.00)
GFR, Estimated: >60 mL/min (ref >60)
Glucose, Bld: 100 mg/dL — ABNORMAL HIGH (ref 70–99)
Potassium: 4.2 mmol/L (ref 3.5–5.1)
Sodium: 140 mmol/L (ref 135–145)

## 2024-06-25 LAB — MAGNESIUM: Magnesium: 2.2 mg/dL (ref 1.7–2.4)

## 2024-06-25 SURGERY — CARDIOVERSION (CATH LAB)
Anesthesia: General

## 2024-06-25 NOTE — Progress Notes (Signed)
 Post dose EKG is reviewed SB 56bpm, QTc remains stable Continue tikosyn Anticipate discharge tomorrow  Charlies Arthur, PA-C

## 2024-06-25 NOTE — Progress Notes (Signed)
 Pharmacy: Dofetilide (Tikosyn) - Follow Up Assessment and Electrolyte Replacement  Pharmacy consulted to assist in monitoring and replacing electrolytes in this 73 y.o. female admitted on 06/23/2024 undergoing dofetilide initiation. First dofetilide dose: 12/2  Labs:    Component Value Date/Time   K 4.2 06/25/2024 0506   MG 2.2 06/25/2024 0506     Plan: Potassium: K >/= 4: No additional supplementation needed  Magnesium: Mg > 2: No additional supplementation needed   As patient has not required potassium replacement and received magnesium 2gm x1 early in stay maintaining level >2 No additional replacement needed.  Olam Chalk Pharm.D. CPP, BCPS Clinical Pharmacist [phone] 06/25/2024 7:33 AM

## 2024-06-25 NOTE — Plan of Care (Signed)
   Problem: Education: Goal: Knowledge of General Education information will improve Description Including pain rating scale, medication(s)/side effects and non-pharmacologic comfort measures Outcome: Progressing

## 2024-06-25 NOTE — Progress Notes (Addendum)
 Rounding Note   Patient Name: Amy Williamson Date of Encounter: 06/25/2024  [facility] Cardiologist: None  EP: Dr. Cindie > Dr. Inocencio  Subjective  Continues to feel well, tolerating drug, no CP, SOB  Scheduled Meds:  acetaminophen   1,000 mg Oral Q8H   apixaban   5 mg Oral BID   cholecalciferol  1,000 Units Oral q AM   diltiazem   120 mg Oral Daily   dofetilide  500 mcg Oral BID   pantoprazole   40 mg Oral Daily   sodium chloride  flush  3 mL Intravenous Q12H   vitamin B-12  1,000 mcg Oral q AM   Continuous Infusions:   PRN Meds: bisacodyl , furosemide , melatonin, polyethylene glycol, sodium chloride  flush   Vital Signs  Vitals:   06/24/24 1637 06/24/24 1925 06/24/24 2329 06/25/24 0535  BP: (!) 140/57 (!) 147/62 (!) 147/65 (!) 139/52  Pulse: 60 60 60 60  Resp: 20 18 16 15   Temp: 98.7 F (37.1 C) 98.2 F (36.8 C) 98 F (36.7 C) 98.2 F (36.8 C)  TempSrc: Oral Oral Oral Oral  SpO2: 96%     Weight:      Height:        Intake/Output Summary (Last 24 hours) at 06/25/2024 0830 Last data filed at 06/25/2024 9171 Gross per 24 hour  Intake 420 ml  Output --  Net 420 ml      06/23/2024    3:16 PM 06/23/2024    8:49 AM 06/11/2024   10:54 AM  Last 3 Weights  Weight (lbs) 179 lb 8 oz 180 lb 178 lb  Weight (kg) 81.421 kg 81.647 kg 80.74 kg      Telemetry  SR  60's - Personally Reviewed  ECG   SR 65bpm, QTc - Personally Reviewed  Physical Exam  unchanged GEN: No acute distress.   Neck: No JVD Cardiac: RRR, no murmurs, rubs, or gallops.  Respiratory: CTA b/l. GI: Soft, nontender, non-distended  MS: No edema; No deformity. Neuro:  Nonfocal  Psych: Normal affect   Labs High Sensitivity Troponin:  No results for input(s): TROPONINIHS in the last 720 hours.   Chemistry Recent Labs  Lab 06/23/24 0910 06/24/24 0436 06/25/24 0506  NA 142 141 140  K 4.8 4.4 4.2  CL 103 107 105  CO2 27 31 27   GLUCOSE 112* 101* 100*  BUN 20 15 15    CREATININE 0.81 0.80 0.75  CALCIUM  9.8 8.9 9.0  MG 1.9 2.4 2.2  GFRNONAA  --  >60 >60  ANIONGAP  --  3* 8    Lipids No results for input(s): CHOL, TRIG, HDL, LABVLDL, LDLCALC, CHOLHDL in the last 168 hours.  HematologyNo results for input(s): WBC, RBC, HGB, HCT, MCV, MCH, MCHC, RDW, PLT in the last 168 hours. Thyroid  No results for input(s): TSH, FREET4 in the last 168 hours.  BNPNo results for input(s): BNP, PROBNP in the last 168 hours.  DDimer No results for input(s): DDIMER in the last 168 hours.   Radiology  No results found.  Cardiac Studies   01/16/24: TTE 1. Left ventricular ejection fraction, by estimation, is 60 to 65%. The  left ventricle has normal function. The left ventricle has no regional  wall motion abnormalities. Left ventricular diastolic parameters are  consistent with Grade II diastolic  dysfunction (pseudonormalization).   2. Right ventricular systolic function is normal. The right ventricular  size is normal. There is normal pulmonary artery systolic pressure. The  estimated right ventricular systolic pressure is  32.6 mmHg.   3. The mitral valve is normal in structure. Mild mitral valve  regurgitation. No evidence of mitral stenosis.   4. The aortic valve is normal in structure. Aortic valve regurgitation is  not visualized. No aortic stenosis is present.   5. The inferior vena cava is normal in size with greater than 50%  respiratory variability, suggesting right atrial pressure of 3 mmHg.       Patient Profile   73 y.o. female w/PMHx of HTN, HLD, morbid obesity Aortic atherosclerosis OSA (uses dental appliance) AFib  Arrhythmia/AAD hx AFib Flecainide  2023 > quickly stopped poorly tolerated AFib ablation 03/30/22 Watchman 06/28/22 AFlutter found Dec 2024 > amiodarone  > stopped Jan 2025 (seems patient preference/anxiety associated with the medication) AFib (RIPV required re-isolation) and CTI ablation  11/28/23  Admitted for tikosyn load  Assessment & Plan   Persistent AFib CHA2DS2Vasc is 3, on Eliquis  Tikosyn load is in progress K+ 4.2 Mag 2.2 Creat 0.75 (stable) QTc stable  Anticipate discharge Friday  2.   HTN Home meds    For questions or updates, please contact [facility] Please consult [email or web address] for contact info under     Signed, Amy Macario Arthur, PA-C  06/25/2024, 8:30 AM     Amy Williamson Amy Williamson was seen by me today along with Amy Williamson. I have personally performed an evaluation on this patient.  My findings are as follows: 73 y.o. female remains in sinus rhythm.  Feeling well without complaint..  Data: EKG(s) and pertinent labs, studies, etc were personally reviewed and interpreted by me:  EKG, telemetry, labs Otherwise, I agree with data as outlined by the advanced practice provider.  Exam performed by me: Gen: No acute distress Neck: No JVD Cardiac: Regular rhythm Lungs: Normal work of breathing Extremities: Edema  My Assessment and Plan: 1.  Persistent atrial fibrillation/flutter: Dofetilide load in progress.  She is in sinus rhythm.  Tolerating dofetilide without issue.  QTc remained stable.  Creatinine unchanged.  [name] continue with current management.  2.  Hypertension: Continue home medications  Signed,  Amy Roma Gladis Norton, MD  06/25/2024 9:37 AM

## 2024-06-26 ENCOUNTER — Other Ambulatory Visit (HOSPITAL_COMMUNITY): Payer: Self-pay

## 2024-06-26 DIAGNOSIS — I4819 Other persistent atrial fibrillation: Secondary | ICD-10-CM | POA: Diagnosis not present

## 2024-06-26 LAB — BASIC METABOLIC PANEL WITH GFR
Anion gap: 9 (ref 5–15)
BUN: 15 mg/dL (ref 8–23)
CO2: 29 mmol/L (ref 22–32)
Calcium: 9 mg/dL (ref 8.9–10.3)
Chloride: 103 mmol/L (ref 98–111)
Creatinine, Ser: 0.87 mg/dL (ref 0.44–1.00)
GFR, Estimated: >60 mL/min (ref >60)
Glucose, Bld: 105 mg/dL — ABNORMAL HIGH (ref 70–99)
Potassium: 4.7 mmol/L (ref 3.5–5.1)
Sodium: 141 mmol/L (ref 135–145)

## 2024-06-26 LAB — MAGNESIUM: Magnesium: 2.1 mg/dL (ref 1.7–2.4)

## 2024-06-26 MED ORDER — DOFETILIDE 500 MCG PO CAPS
500.0000 ug | ORAL_CAPSULE | Freq: Two times a day (BID) | ORAL | 5 refills | Status: DC
  Filled 2024-06-26: qty 60, 30d supply, fill #0

## 2024-06-26 NOTE — Progress Notes (Signed)
 Pharmacy: Dofetilide  (Tikosyn ) - Follow Up Assessment and Electrolyte Replacement  Pharmacy consulted to assist in monitoring and replacing electrolytes in this 73 y.o. female admitted on 06/23/2024 undergoing dofetilide  initiation.  Labs:    Component Value Date/Time   K 4.7 06/26/2024 0405   MG 2.1 06/26/2024 0405     Plan: Potassium: K >/= 4: No additional supplementation needed  Magnesium : Mg > 2: No additional supplementation needed   I recommend no daily potassium supplement at discharge  Thank you for allowing pharmacy to participate in this patient's care   Prentice Poisson, PharmD Clinical Pharmacist **Pharmacist phone directory can now be found on [email or web address] ([email or web address]).  Listed under [facility].

## 2024-06-26 NOTE — Discharge Summary (Cosign Needed)
 ELECTROPHYSIOLOGY PROCEDURE DISCHARGE SUMMARY    Patient ID: Amy Williamson,  MRN: [id number], DOB/AGE: [birth year] 73 y.o.  Admit date: 06/23/2024 Discharge date: 06/26/2024  Primary Care Physician: Rollene Almarie LABOR, MD Electrophysiologist: Dr. Cindie > Dr. Inocencio  Primary Discharge Diagnosis:  1.  persistent atrial fibrillation status post Tikosyn  loading this admission      CHA2DS2Vasc is 3, on Eliquis   Secondary Discharge Diagnosis:  HTN  Allergies  Allergen Reactions   Tramadol  Itching and Rash    May have been shingles instead of allergy per patient   Atenolol      Decreased blood pressure   Avelox [Moxifloxacin Hcl In Nacl]     Pt did not like side effects   Azithromycin      ?causes muscle weakness, dysphagia   Doxycycline  Nausea Only    And heartburn   Moxifloxacin Hcl    Myrbetriq  [Mirabegron ]     Increased blood pressure   Naproxen     Can not take while on Eliquis    Oxybutynin  Palpitations    Per pt made her have rapid heart beat     Procedures This Admission:  1.  Tikosyn  loading  Brief HPI: Amy Williamson is a 73 y.o. female with a past medical history as noted above.  Longstanding patient in the EP clinic for her AFib    Hospital Course:  The patient was admitted and Tikosyn  was initiated.  Renal function and electrolytes were followed during the hospitalization.  The patient's QTc remained stable.  She arrived in and maintained SR.   On the day of discharge, she feels well, was examined by Dr Inocencio who considered the patient stable for discharge to home.  Follow-up has been arranged with the AFib clinic in 1 week and the EP team in 4 weeks.   Tikosyn  teaching was completed No new or electrolyte replacement for home   Physical Exam: Vitals:   06/26/24 0023 06/26/24 0448 06/26/24 0734 06/26/24 0858  BP: (!) 151/62 139/60 (!) 141/59 137/61  Pulse: 61 61 (!) 57   Resp: 16 17 16    Temp: 98.2 F (36.8 C) 97.8 F (36.6 C) 97.9 F  (36.6 C)   TempSrc: Oral Oral Oral   SpO2: 96% 95% 95%   Weight:      Height:         GEN- The patient is well appearing, alert and oriented x 3 today.   HEENT: normocephalic, atraumatic; sclera clear, conjunctiva pink; hearing intact; oropharynx clear; neck supple, no JVP Lymph- no cervical lymphadenopathy Lungs- CTA b/l, normal work of breathing.  No wheezes, rales, rhonchi Heart- RRR, no murmurs, rubs or gallops, PMI not laterally displaced GI- soft, non-tender, non-distended Extremities- no clubbing, cyanosis, or edema MS- no significant deformity or atrophy Skin- warm and dry, no rash or lesion Psych- euthymic mood, full affect Neuro- strength and sensation are intact   Labs:   Lab Results  Component Value Date   WBC 11.9 (H) 06/05/2024   HGB 14.6 06/05/2024   HCT 45.2 06/05/2024   MCV 88 06/05/2024   PLT 321 06/05/2024    Recent Labs  Lab 06/26/24 0405  NA 141  K 4.7  CL 103  CO2 29  BUN 15  CREATININE 0.87  CALCIUM  9.0  GLUCOSE 105*     Discharge Medications:  Allergies as of 06/26/2024       Reactions   Tramadol  Itching, Rash   May have been shingles instead of allergy per patient  Atenolol     Decreased blood pressure   Avelox [moxifloxacin Hcl In Nacl]    Pt did not like side effects   Azithromycin     ?causes muscle weakness, dysphagia   Doxycycline  Nausea Only   And heartburn   Moxifloxacin Hcl    Myrbetriq  [mirabegron ]    Increased blood pressure   Naproxen    Can not take while on Eliquis    Oxybutynin  Palpitations   Per pt made her have rapid heart beat        Medication List     TAKE these medications    acetaminophen  650 MG CR tablet Commonly known as: TYLENOL  Take 1,300 mg by mouth in the morning and at bedtime.   amLODipine  5 MG tablet Commonly known as: NORVASC  Take 1 tablet (5 mg total) by mouth daily. Hold while on diltiazem    apixaban  5 MG Tabs tablet Commonly known as: Eliquis  Take 1 tablet (5 mg total) by  mouth 2 (two) times daily.   B-12 PO Take 1,000 mcg by mouth in the morning.   benzonatate  100 MG capsule Commonly known as: TESSALON  Take 100 mg by mouth 3 (three) times daily as needed for cough.   bisacodyl  5 MG EC tablet Commonly known as: DULCOLAX Take 5 mg by mouth daily as needed for moderate constipation. What changed: when to take this   diltiazem  120 MG 24 hr capsule Commonly known as: Cardizem  CD Take 1 capsule (120 mg total) by mouth daily.   dofetilide  500 MCG capsule Commonly known as: TIKOSYN  Take 1 capsule (500 mcg total) by mouth 2 (two) times daily.   furosemide  20 MG tablet Commonly known as: LASIX  Take 1 tablet (20 mg total) by mouth daily as needed. What changed: when to take this   pantoprazole  40 MG tablet Commonly known as: PROTONIX  TAKE 1 TABLET BY MOUTH EVERY DAY   Repatha  SureClick 140 MG/ML Soaj Generic drug: Evolocumab  Inject 140 mg into the skin every 14 (fourteen) days.   VITAMIN D3 PO Take 1,000 Units by mouth in the morning.   Zepbound  10 MG/0.5ML Pen Generic drug: tirzepatide  Inject 10 mg into the skin once a week.        Disposition: home Discharge Instructions     Diet - low sodium heart healthy   Complete by: As directed    Increase activity slowly   Complete by: As directed         Duration of Discharge Encounter: 15 minutes, APP time.  Bonney Charlies Arthur, PA-C 06/26/2024 11:33 AM   [name] was seen by me today along with ARC. I have personally performed an evaluation on this patient.  My findings are as follows: 73 y.o. female feeling well.  No acute complaints..  Data: EKG(s) and pertinent labs, studies, etc were personally reviewed and interpreted by me:  EKG, telemetry, lab Otherwise, I agree with data as outlined by the advanced practice provider.  Exam performed by me: Gen: No acute distress Neck: No JVD Cardiac: Regular Lungs: Normal work of breathing Extremities: No edema  My  Assessment and Plan:  1.  Persistent atrial fibrillation: Post dofetilide  load.  Patient presented in sinus rhythm.  Had no episodes of atrial fibrillation.  QTc remained stable.  Will plan for discharge today with follow-up in clinic.  2.  Hypertension: Continue home medications  MD time at discharge: 20 minutes  Signed,  Will Gladis Norton, MD  06/29/2024 9:39 AM

## 2024-06-26 NOTE — Care Management Important Message (Signed)
 Important Message  Patient Details  Name: Amy Williamson MRN: [id number] Date of Birth: April 29, 1951   Important Message Given:  Yes - Medicare IM     Vonzell Arrie Sharps 06/26/2024, 9:52 AM

## 2024-06-26 NOTE — Progress Notes (Signed)
 Post dose EKG and telemetry reviewed, QTc stable Maintaining SR  Anticipate discharge later today  Charlies Arthur, PA-C

## 2024-06-26 NOTE — Progress Notes (Signed)
 Reviewed AVS, patient expressed understanding of medications, MD follow up reviewed.    Patient states all belongings brought to the hospital at time of admission are accounted for and packed to take home.   Picked up medications from [facility] pharmacy.  [employer] contacted to transport patient to entrance A, where family member was waiting in vehicle to transport home.

## 2024-06-29 ENCOUNTER — Ambulatory Visit: Attending: Internal Medicine | Admitting: Internal Medicine

## 2024-06-29 ENCOUNTER — Encounter: Payer: Self-pay | Admitting: Internal Medicine

## 2024-06-29 VITALS — BP 150/70 | HR 64 | Ht 60.0 in | Wt 176.0 lb

## 2024-06-29 DIAGNOSIS — I2510 Atherosclerotic heart disease of native coronary artery without angina pectoris: Secondary | ICD-10-CM | POA: Diagnosis not present

## 2024-06-29 DIAGNOSIS — I10 Essential (primary) hypertension: Secondary | ICD-10-CM | POA: Diagnosis not present

## 2024-06-29 DIAGNOSIS — I4819 Other persistent atrial fibrillation: Secondary | ICD-10-CM

## 2024-06-29 MED ORDER — LOSARTAN POTASSIUM 25 MG PO TABS: 25.0000 mg | ORAL_TABLET | Freq: Every day | ORAL | 3 refills | Status: DC

## 2024-06-29 NOTE — Progress Notes (Signed)
 Cardiology Office Note:  .    Date:  06/29/2024  ID:  Amy Williamson Amber, DOB 1951-06-08, MRN [id number] PCP: Amy Williamson LABOR, MD  [facility] Providers Cardiologist:  None Electrophysiologist:  Amy ONEIDA HOLTS, MD  Sleep Medicine:  Amy Bihari, MD     CC: AFL and sleep f/u  History of Present Illness: .    Amy Williamson is Amy 73 y.o. female with atrial fibrillation who presents for blood pressure management.  She has Amy history of atrial fibrillation, for which she underwent Amy Watchman procedure and prior ablation. Recently, she was hospitalized for Amy Tikosyn  load, which she has tolerated well. Her heart 'went back into aching' in November. She is on Tikosyn , diltiazem , and Eliquis . She has experienced Amy significant weight loss of 35 pounds, now weighing 179 pounds, and attributes some of this to Amy loss of appetite, Amy side effect of Tikosyn .  She has Amy history of aortic atherosclerosis and hyperlipidemia, and she experiences statin myopathy. She is currently managing her hyperlipidemia with Repatha , as she has had issues with statins in the past.  She also has Amy history of obstructive sleep apnea and is experiencing constipation, which she attributes to her medication regimen, particularly Zepbound . She is managing constipation with Depilax, although she reports it is not very effective. She drinks approximately 40 ounces of water  daily, which she refills once.  She has Amy known history of white coat hypertension. She was previously on amlodipine  but was switched to diltiazem  when she went into AFib. Her heart rate is slower, sometimes reaching the upper forties to low fifties, which she attributes to diltiazem .  She has Amy history of breast cancer but did not undergo cardiotoxic chemotherapy or radiation. She has been on pantoprazole  for at least ten years without issues. She is currently on five medications: Tikosyn , diltiazem , Eliquis , pantoprazole , and Repatha .  Discussed  the use of AI scribe software for clinical note transcription with the patient, who gave verbal consent to proceed.   Relevant histories: .  Social  202: Established care. 2022: Had DCCV.  Saw Dr. Bihari for OSA. 2023: Had multiple DCCVs and started flecainide  (if needed).  AF ablation scheduled for 9/23.  Had Watchman.  Had R groin bleed but otherwise did well.  Had back surgery 2024: S/p Watchman.  New AFL.  Amiodarone  started 12/24 ROS: As per HPI.   Studies Reviewed: .     Cardiac Studies & Procedures   ______________________________________________________________________________________________   STRESS TESTS  MYOCARDIAL PERFUSION IMAGING 10/19/2021  Interpretation Summary   The study is normal. The study is low risk.   No ST deviation was noted.   LV perfusion is normal.   Left ventricular function is normal. Nuclear stress EF: 69 %. The left ventricular ejection fraction is hyperdynamic (>65%). End diastolic cavity size is normal.   Prior study not available for comparison.   ECHOCARDIOGRAM  ECHOCARDIOGRAM COMPLETE 01/16/2024  Narrative ECHOCARDIOGRAM REPORT    Patient Name:   Amy Williamson Date of Exam: 01/16/2024 Medical Rec #:  [id number]        Height:       60.0 in Accession #:    [id number]       Weight:       211.0 lb Date of Birth:  [birth year]         BSA:          1.910 m Patient Age:    50 years  BP:           154/79 mmHg Patient Gender: F                HR:           74 bpm. Exam Location:  [address]  Procedure: 2D Echo, Cardiac Doppler, Color Doppler and Intracardiac Opacification Agent (Both Spectral and Color Flow Doppler were utilized during procedure).  Indications:    R60.0 Edema  History:        Patient has prior history of Echocardiogram examinations, most recent 01/10/2022. CAD, s/p multiple DCCV s/p ablation s/p Watchman, Arrythmias:Atrial Fibrillation and Atrial Flutter, Signs/Symptoms:Edema; Risk Factors:Sleep Apnea and  Obesity. Previous echo revealed LVEF 65%.  Sonographer:    Amy Williamson, RDCS Referring Phys: [id number] Amy Williamson  IMPRESSIONS   1. Left ventricular ejection fraction, by estimation, is 60 to 65%. The left ventricle has normal function. The left ventricle has no regional wall motion abnormalities. Left ventricular diastolic parameters are consistent with Grade II diastolic dysfunction (pseudonormalization). 2. Right ventricular systolic function is normal. The right ventricular size is normal. There is normal pulmonary artery systolic pressure. The estimated right ventricular systolic pressure is 32.6 mmHg. 3. The mitral valve is normal in structure. Mild mitral valve regurgitation. No evidence of mitral stenosis. 4. The aortic valve is normal in structure. Aortic valve regurgitation is not visualized. No aortic stenosis is present. 5. The inferior vena cava is normal in size with greater than 50% respiratory variability, suggesting right atrial pressure of 3 mmHg.  FINDINGS Left Ventricle: Left ventricular ejection fraction, by estimation, is 60 to 65%. The left ventricle has normal function. The left ventricle has no regional wall motion abnormalities. Definity  contrast agent was given IV to delineate the left ventricular endocardial borders. The left ventricular internal cavity size was normal in size. There is no left ventricular hypertrophy. Left ventricular diastolic parameters are consistent with Grade II diastolic dysfunction (pseudonormalization).  Right Ventricle: The right ventricular size is normal. No increase in right ventricular wall thickness. Right ventricular systolic function is normal. There is normal pulmonary artery systolic pressure. The tricuspid regurgitant velocity is 2.72 m/s, and with an assumed right atrial pressure of 3 mmHg, the estimated right ventricular systolic pressure is 32.6 mmHg.  Left Atrium: Left atrial size was normal in size.  Right  Atrium: Right atrial size was normal in size.  Pericardium: There is no evidence of pericardial effusion.  Mitral Valve: The mitral valve is normal in structure. Mild mitral valve regurgitation. No evidence of mitral valve stenosis.  Tricuspid Valve: The tricuspid valve is normal in structure. Tricuspid valve regurgitation is mild . No evidence of tricuspid stenosis.  Aortic Valve: The aortic valve is normal in structure. Aortic valve regurgitation is not visualized. No aortic stenosis is present.  Pulmonic Valve: The pulmonic valve was normal in structure. Pulmonic valve regurgitation is trivial. No evidence of pulmonic stenosis.  Aorta: The aortic root is normal in size and structure.  Venous: The inferior vena cava is normal in size with greater than 50% respiratory variability, suggesting right atrial pressure of 3 mmHg.  IAS/Shunts: No atrial level shunt detected by color flow Doppler.   LEFT VENTRICLE PLAX 2D LVIDd:         4.95 cm   Diastology LVIDs:         3.20 cm   LV e' medial:    7.51 cm/s LV PW:         0.95  cm   LV E/e' medial:  18.2 LV IVS:        0.80 cm   LV e' lateral:   5.87 cm/s LVOT diam:     1.80 cm   LV E/e' lateral: 23.3 LV SV:         61 LV SV Index:   32 LVOT Area:     2.54 cm   RIGHT VENTRICLE RV Basal diam:  3.60 cm RV Mid diam:    3.30 cm RV S prime:     12.70 cm/s TAPSE (M-mode): 2.8 cm  LEFT ATRIUM             Index        RIGHT ATRIUM           Index LA diam:        4.40 cm 2.30 cm/m   RA Area:     10.70 cm LA Vol (A2C):   57.3 ml 30.01 ml/m  RA Volume:   20.20 ml  10.58 ml/m LA Vol (A4C):   60.2 ml 31.53 ml/m LA Biplane Vol: 59.4 ml 31.11 ml/m AORTIC VALVE             PULMONIC VALVE LVOT Vmax:   105.00 cm/s PR End Diast Vel: 8.76 msec LVOT Vmean:  70.700 cm/s LVOT VTI:    0.238 m  AORTA Ao Root diam: 3.20 cm Ao Asc diam:  3.70 cm  MITRAL VALVE                TRICUSPID VALVE MV Area (PHT): 3.03 cm     TR Peak grad:   29.6  mmHg MV Decel Time: 250 msec     TR Vmax:        272.00 cm/s MV E velocity: 137.00 cm/s MV Amy velocity: 83.30 cm/s   SHUNTS MV E/Amy ratio:  1.64         Systemic VTI:  0.24 m Systemic Diam: 1.80 cm  Aditya Sabharwal Electronically signed by Ria Commander Signature Date/Time: 01/16/2024/1:44:24 PM    Final   TEE  ECHO TEE 06/28/2022  Narrative TRANSESOPHOGEAL ECHO REPORT    Patient Name:   Amy Williamson Date of Exam: 06/28/2022 Medical Rec #:  [id number]        Height:       60.0 in Accession #:    [id number]       Weight:       200.0 lb Date of Birth:  1950/11/23         BSA:          1.867 m Patient Age:    71 years         BP:           136/51 mmHg Patient Gender: F                HR:           76 bpm. Exam Location:  Inpatient  Procedure: Transesophageal Echo, Cardiac Doppler, Color Doppler and 3D Echo  Indications:     Persistent atrial fibrillation (HCC) [P51.80 (ICD-10-CM)]  History:         Patient has prior history of Echocardiogram examinations, most recent 01/10/2022. Risk Factors:Hypertension, Sleep Apnea and Dyslipidemia. 27mm Watchman implanted on 06/28/2022. GERD.  Sonographer:     Annabella Fell RDCS Referring Phys:  [id number] Amy Williamson Diagnosing Phys: Stanly Leavens MD  PROCEDURE: After discussion of the risks and benefits of Amy TEE, an informed consent  was obtained from the patient. The patient was intubated. TEE procedure time was 39 minutes. The transesophogeal probe was passed without difficulty through the esophogus of the patient. Imaged were obtained with the patient in Amy supine position. Sedation performed by different physician. The patient was monitored while under deep sedation. Anesthestetic sedation was provided intravenously by Anesthesiology: 130mg  of Propofol , 80mg  of Lidocaine . Supplementary images were obtained from transthoracic windows as indicated to answer the clinical question. The patient developed no complications during  the procedure.  IMPRESSIONS   1. Interventional TEE for Watchman FLX procedure. 2. Prior to procedure, there was Amy patent left atrial appendage with Amy windsock morphology. Maximal diameter 20 cm (ostial deployment) with suitable depth for device. 3. Mid- mid stick with no complications. 4. Placement of 27 mm Watchman FLX device. No leak. No Mitral shoulder. Average compression ~ 23%. 5. Left ventricular ejection fraction, by estimation, is 60 to 65%. The left ventricle has normal function. 6. Right ventricular systolic function is normal. The right ventricular size is normal. 7. Left atrial size was mildly dilated. No left atrial/left atrial appendage thrombus was detected. 8. The mitral valve is normal in structure. Mild mitral valve regurgitation. 9. The aortic valve is normal in structure. Aortic valve regurgitation is not visualized. No aortic stenosis is present. 10. Evidence of atrial level shunting detected by color flow Doppler after transeptal puncture, with all left to right flow. 11. Trivial apical pericardial effusion is unchanged from prior.  FINDINGS Left Ventricle: Left ventricular ejection fraction, by estimation, is 60 to 65%. The left ventricle has normal function. The left ventricular internal cavity size was normal in size.  Right Ventricle: The right ventricular size is normal. No increase in right ventricular wall thickness. Right ventricular systolic function is normal.  Left Atrium: Left atrial size was mildly dilated. No left atrial/left atrial appendage thrombus was detected.  Right Atrium: Right atrial size was normal in size.  Pericardium: Trivial pericardial effusion is present. The pericardial effusion is circumferential and surrounding the apex.  Mitral Valve: The mitral valve is normal in structure. Mild mitral valve regurgitation.  Tricuspid Valve: The tricuspid valve is normal in structure. Tricuspid valve regurgitation is mild . No evidence of  tricuspid stenosis.  Aortic Valve: The aortic valve is normal in structure. Aortic valve regurgitation is not visualized. No aortic stenosis is present.  Pulmonic Valve: The pulmonic valve was normal in structure. Pulmonic valve regurgitation is trivial. No evidence of pulmonic stenosis.  Aorta: The aortic root is normal in size and structure.  IAS/Shunts: Evidence of atrial level shunting detected by color flow Doppler.   TRICUSPID VALVE TR Peak grad:   24.2 mmHg TR Vmax:        246.00 cm/s  Stanly Leavens MD Electronically signed by Stanly Leavens MD Signature Date/Time: 06/28/2022/11:03:59 AM    Final        ______________________________________________________________________________________________       Physical Exam:    VS:  BP (!) 150/70 (Cuff Size: Normal)   Pulse 64   Ht 5' (1.524 m)   Wt 176 lb (79.8 kg)   LMP 07/23/2004   SpO2 98%   BMI 34.37 kg/m    Wt Readings from Last 3 Encounters:  06/29/24 176 lb (79.8 kg)  06/23/24 179 lb 8 oz (81.4 kg)  06/23/24 180 lb (81.6 kg)    Gen: no distress   Neck: No JVD Cardiac: No Rubs or Gallops, Systolic Murmur, RRR +2 radial pulses Respiratory: Clear to auscultation  bilaterally, normal effort, normal  respiratory rate GI: Soft, nontender, non-distended  MS: No  edema;  moves all extremities Integument: Skin feels warm Neuro:  At time of evaluation, alert and oriented to person/place/time/situation  Psych: Normal affect, patient feels ok  HYPERTENSION CONTROL Vitals:   06/29/24 0914 06/29/24 1200  BP: (!) 160/78 (!) 150/70    The patient's blood pressure is elevated above target today.  In order to address the patient's elevated BP: Amy new medication was prescribed today.       ASSESSMENT AND PLAN: .    Atrial fibrillation and atrial flutter status post ablation and Watchman device, on Tikosyn  Status post Tikosyn  loading for atrial fibrillation and atrial flutter. Currently in rhythm  with Tikosyn . Previous ablation in May did not prevent recurrence of atrial flutter in November. No desire for another ablation unless necessary. Eliquis  resumed due to recurrence of AFib, but plans to discontinue if rhythm is maintained. Diltiazem  was resumed during AFib episode, but may not be necessary if rhythm is controlled. - Continue Tikosyn  for rhythm control. - Will reassess need for Eliquis  after AFib clinic visit. - Will consider discontinuing diltiazem  if rhythm is controlled.  Hypertension (including white coat hypertension) Blood pressure elevated today, possibly due to white coat hypertension. Current blood pressure is 150 mmHg systolic. Diltiazem  may be contributing to bradycardia. - Rechecked blood pressure. - transition to ARB (worked well in 2015) and plans with Dr. 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 The visit required Amy comprehensive assessment and management plan tailored to the patient's unique needs Time was spent addressing not only the acute concerns but also the broader context of the patient's health, including preventive care, chronic disease management, and care coordination as appropriate.  Complex longitudinal is necessary for conditions including: Persistent AFL and AF despite two ablations and intolerance to several medication including atenolol  associated hypotension    Stanly Leavens, MD FASE [facility] Cardiologist [facility]  [address] [place], [address] [place], [place] [address] [phone]  12:00 PM

## 2024-06-29 NOTE — Patient Instructions (Addendum)
 Medication Instructions:  Your physician has recommended you make the following change in your medication:  REMOVED: amlodipine  from your medication list STOP: Diltiazem   START: Losartan  25 mg by mouth once daily.  Have lab work with your Primary Care Provider.    *If you need a refill on your cardiac medications before your next appointment, please call your pharmacy*  Lab Work: [facility] with Primary Care Provider  If you have labs (blood work) drawn today and your tests are completely normal, you will receive your results only by: MyChart Message (if you have MyChart) OR A paper copy in the mail If you have any lab test that is abnormal or we need to change your treatment, we will call you to review the results.  Testing/Procedures: NONE  Follow-Up: At [facility], you and your health needs are our priority.  As part of our continuing mission to provide you with exceptional heart care, our providers are all part of one team.  This team includes your primary Cardiologist (physician) and Advanced Practice Providers or APPs (Physician Assistants and Nurse Practitioners) who all work together to provide you with the care you need, when you need it.  Your next appointment:   6 month(s)  Provider:   Stanly Leavens, MD or Josefa Beauvais, NP, Orren Fabry, PA-C, Jon Hails, PA-C, or Damien Braver, NP

## 2024-07-03 ENCOUNTER — Ambulatory Visit (HOSPITAL_COMMUNITY)
Admission: RE | Admit: 2024-07-03 | Discharge: 2024-07-03 | Attending: Physician Assistant | Admitting: Physician Assistant

## 2024-07-03 VITALS — BP 162/72 | HR 65 | Ht 60.0 in | Wt 178.4 lb

## 2024-07-03 DIAGNOSIS — I4819 Other persistent atrial fibrillation: Secondary | ICD-10-CM | POA: Diagnosis not present

## 2024-07-03 DIAGNOSIS — I4891 Unspecified atrial fibrillation: Secondary | ICD-10-CM | POA: Diagnosis not present

## 2024-07-03 DIAGNOSIS — D6869 Other thrombophilia: Secondary | ICD-10-CM | POA: Diagnosis not present

## 2024-07-03 DIAGNOSIS — Z5181 Encounter for therapeutic drug level monitoring: Secondary | ICD-10-CM | POA: Diagnosis not present

## 2024-07-03 DIAGNOSIS — Z79899 Other long term (current) drug therapy: Secondary | ICD-10-CM | POA: Diagnosis not present

## 2024-07-03 MED ORDER — DOFETILIDE 500 MCG PO CAPS: 500.0000 ug | ORAL_CAPSULE | Freq: Two times a day (BID) | ORAL | 1 refills | Status: AC

## 2024-07-03 NOTE — Progress Notes (Signed)
 Primary Care Physician: Rollene Almarie LABOR, MD Primary Cardiologist: Stanly LABOR Leavens, MD Electrophysiologist: OLE ONEIDA HOLTS, MD  Referring Physician: Josefa Beauvais, NP   Amy Williamson is a 73 y.o. female with a history of HTN, aortic atherosclerosis, OSA, HLD, atrial fibrillation who presents for follow up in the [facility].  The patient was initially diagnosed with atrial fibrillation 06/2020 and underwent DCCV on 07/28/20. She presented to the ED 09/26/21 with symptoms palpitations and underwent DCCV but unfortunately returned to the ED on 10/06/21 and had another DCCV. She has symptoms of fatigue while in afib and for several days afterwards. She did have a steroid injection for bursitis a week before her first ED visit. She was started on flecainide  but had intolerable side effects. She did have a breakthrough episode of afib 12/12/21 and took a PRN dose of flecainide  which converted her to SR.    S/p afib ablation 03/30/22 with Dr Holts. S/p Watchman implant 06/28/22. She presented to the emergency department July 04, 2023 with rapidly conducted atrial flutter. Eliquis  was restarted and she was set up for outpatient cardioversion. She was started on amiodarone  and underwent DCCV on 07/11/23. She then underwent repeat ablation, afib and flutter, with Dr Holts on 11/28/23. Amiodarone  was discontinue prior to ablation.  During previous visit she presented in symptomatic atrial fibrillation and underwent DCCV with conversion to sinus rhythm on 06/11/2024. During visit she also expressed interest in pursuing dofetilide  for rhythm control.  Patient returns for follow up for atrial fibrillation and dofetilide  monitoring. She is s/p dofetilide  loading 06/23/24. She remains in SR today and feels well. No bleeding issues on anticoagulation.   Today, she  denies symptoms of palpitations, chest pain, shortness of breath, orthopnea, PND, lower extremity edema,  dizziness, presyncope, syncope, bleeding, or neurologic sequela. The patient is tolerating medications without difficulties and is otherwise without complaint today.    Atrial Fibrillation Risk Factors:   she does have symptoms or diagnosis of sleep apnea. she does not have a history of rheumatic fever. she does not have a history of alcohol use. The patient does not have a history of early familial atrial fibrillation or other arrhythmias.     Atrial Fibrillation Management history:   Previous antiarrhythmic drugs: flecainide , amiodarone  (did not tolerate well), dofetilide   Previous cardioversions: 07/28/20, 09/26/21, 10/06/21, 07/11/23, 06/11/24 Previous ablations: 03/30/22, 11/28/23 Anticoagulation history: Eliquis   ROS- All systems are reviewed and negative except as per the HPI above.  Past Medical History:  Diagnosis Date   Anticoagulant long-term use    eliquis  --- managed by cardiology   Arthritis    BACK AND SHOULDERS   Depressive disorder, not elsewhere classified    Essential hypertension    followed by pcp and cardiology   Frequency of urination    GERD (gastroesophageal reflux disease)    Headache    migraines when younger   History of dysphagia 2015   s/p egd w/ dilation, pt did not have stricture   History of gastritis    OSA (obstructive sleep apnea)    followed by dr t. turner---  study in epic 09-03-2020 moderate osa ,  pt scheduled for cpap titration 12-08-2020- pt wears oral device, cannot wear CPAP   PAF (paroxysmal atrial fibrillation) [facility])    cardiologist-- dr emerson leavens---- new onset 06-30-2020 at pcp office visit,  s/p DCCV 07-28-2020 successful   Palpitations 06/2020   11-16-2020  per pt denies palpitations since DCCV 07-28-2020  Plantar fasciitis 08/2017   left foot    Pneumonia    PONV (postoperative nausea and vomiting)    nausea after back surgery, no issues after ablation   Presence of Watchman left atrial appendage closure device  06/28/2022   Watchman FLX 27mm with Dr. Cindie   Pure hypercholesterolemia     Current Outpatient Medications  Medication Sig Dispense Refill   acetaminophen  (TYLENOL ) 650 MG CR tablet Take 1,300 mg by mouth in the morning and at bedtime.     apixaban  (ELIQUIS ) 5 MG TABS tablet Take 1 tablet (5 mg total) by mouth 2 (two) times daily. 60 tablet 3   bisacodyl  (DULCOLAX) 5 MG EC tablet Take 5 mg by mouth daily as needed for moderate constipation.     Cholecalciferol  (VITAMIN D3 PO) Take 1,000 Units by mouth in the morning.     Cyanocobalamin  (B-12 PO) Take 1,000 mcg by mouth in the morning.     Evolocumab  (REPATHA  SURECLICK) 140 MG/ML SOAJ Inject 140 mg into the skin every 14 (fourteen) days. 6 mL 3   furosemide  (LASIX ) 20 MG tablet Take 1 tablet (20 mg total) by mouth daily as needed. 30 tablet 11   losartan  (COZAAR ) 25 MG tablet Take 1 tablet (25 mg total) by mouth daily. 90 tablet 3   pantoprazole  (PROTONIX ) 40 MG tablet TAKE 1 TABLET BY MOUTH EVERY DAY 90 tablet 4   dofetilide  (TIKOSYN ) 500 MCG capsule Take 1 capsule (500 mcg total) by mouth 2 (two) times daily. 180 capsule 1   tirzepatide  (ZEPBOUND ) 10 MG/0.5ML Pen Inject 10 mg into the skin once a week. (Patient not taking: Reported on 07/03/2024) 2 mL 0   No current facility-administered medications for this encounter.    Physical Exam: BP (!) 162/72   Pulse 65   Ht 5' (1.524 m)   Wt 80.9 kg   LMP 07/23/2004   BMI 34.84 kg/m   GEN: Well nourished, well developed in no acute distress CARDIAC: Regular rate and rhythm, no murmurs, rubs, gallops RESPIRATORY:  Clear to auscultation without rales, wheezing or rhonchi  ABDOMEN: Soft, non-tender, non-distended EXTREMITIES:  No edema; No deformity    Wt Readings from Last 3 Encounters:  07/03/24 80.9 kg  06/29/24 79.8 kg  06/23/24 81.4 kg     EKG Interpretation Date/Time:  Friday July 03 2024 14:02:02 EST Ventricular Rate:  65 PR Interval:  200 QRS Duration:  82 QT  Interval:  434 QTC Calculation: 451 R Axis:   19  Text Interpretation: Normal sinus rhythm When compared with ECG of 26-Jun-2024 11:20, No significant change was found Confirmed by Cherlynn Popiel ([id number]) on 07/03/2024 2:28:28 PM    Echo 01/16/24 demonstrated: 1. Left ventricular ejection fraction, by estimation, is 60 to 65%. The  left ventricle has normal function. The left ventricle has no regional  wall motion abnormalities. Left ventricular diastolic parameters are  consistent with Grade II diastolic dysfunction (pseudonormalization).   2. Right ventricular systolic function is normal. The right ventricular  size is normal. There is normal pulmonary artery systolic pressure. The  estimated right ventricular systolic pressure is 32.6 mmHg.   3. The mitral valve is normal in structure. Mild mitral valve  regurgitation. No evidence of mitral stenosis.   4. The aortic valve is normal in structure. Aortic valve regurgitation is  not visualized. No aortic stenosis is present.   5. The inferior vena cava is normal in size with greater than 50%  respiratory variability, suggesting right atrial pressure  of 3 mmHg.    CHA2DS2-VASc Score = 4  The patient's score is based upon: CHF History: 0 HTN History: 1 Diabetes History: 0 Stroke History: 0 Vascular Disease History: 1 Age Score: 1 Gender Score: 1       ASSESSMENT AND PLAN: Persistent Atrial Fibrillation (ICD10:  I48.19) The patient's CHA2DS2-VASc score is 4, indicating a 4.8% annual risk of stroke.   S/p afib ablation 03/30/22, afib and flutter ablation 11/28/23. Patient appears to be maintaining SR Continue dofetilide  500 mcg BID Continue Eliquis  5 mg BID for 4 weeks post DCCV (07/09/24) then stop and resume ASA 81 mg daily.  Secondary Hypercoagulable State (ICD10:  D68.69) The patient is at significant risk for stroke/thromboembolism based upon her CHA2DS2-VASc Score of 4.  Continue Apixaban  (Eliquis ) for now. On 12/18 stop Eliquis   and start ASA 81 mg daily. S/p Watchman implant 06/28/22.  High Risk Medication Monitoring (ICD 10: [id number]) Patient requires ongoing monitoring for anti-arrhythmic medication which has the potential to cause life threatening arrhythmias. QT interval on ECG acceptable for dofetilide  monitoring. Check bmet/mag today.     Obesity Body mass index is 34.84 kg/m.  Encouraged lifestyle modification   OSA  Encouraged nightly use of oral device.   HTN Elevated today, recently started on losartan  earlier this week. She will continue to monitor at home and reach out of readings remain elevated.    Follow up in the AF clinic in one month.    [facility] [address] [place], [place] [address] [phone]

## 2024-07-04 LAB — BASIC METABOLIC PANEL WITH GFR
BUN/Creatinine Ratio: 15 (ref 12–28)
BUN: 14 mg/dL (ref 8–27)
CO2: 24 mmol/L (ref 20–29)
Calcium: 9.5 mg/dL (ref 8.7–10.3)
Chloride: 102 mmol/L (ref 96–106)
Creatinine, Ser: 0.92 mg/dL (ref 0.57–1.00)
Glucose: 93 mg/dL (ref 70–99)
Potassium: 4.3 mmol/L (ref 3.5–5.2)
Sodium: 140 mmol/L (ref 134–144)
eGFR: 65 mL/min/1.73 (ref >59)

## 2024-07-04 LAB — MAGNESIUM: Magnesium: 2.1 mg/dL (ref 1.6–2.3)

## 2024-07-06 ENCOUNTER — Ambulatory Visit (HOSPITAL_COMMUNITY): Payer: Self-pay | Admitting: Physician Assistant

## 2024-07-06 MED ORDER — ZEPBOUND 2.5 MG/0.5ML ~~LOC~~ SOAJ: 2.5000 mg | SUBCUTANEOUS | 0 refills | Status: DC

## 2024-07-06 NOTE — Addendum Note (Signed)
 Addended by: Donnalynn Wheeless D on: 07/06/2024 02:49 PM   Modules accepted: Orders

## 2024-07-09 ENCOUNTER — Encounter: Payer: Medicare Other | Admitting: Internal Medicine

## 2024-07-10 ENCOUNTER — Encounter: Payer: Self-pay | Admitting: Internal Medicine

## 2024-07-10 ENCOUNTER — Ambulatory Visit: Admitting: Internal Medicine

## 2024-07-10 VITALS — BP 130/80 | HR 58 | Temp 97.6°F | Ht 60.0 in | Wt 179.4 lb

## 2024-07-10 DIAGNOSIS — G4733 Obstructive sleep apnea (adult) (pediatric): Secondary | ICD-10-CM | POA: Diagnosis not present

## 2024-07-10 DIAGNOSIS — I272 Pulmonary hypertension, unspecified: Secondary | ICD-10-CM | POA: Diagnosis not present

## 2024-07-10 DIAGNOSIS — Z0000 Encounter for general adult medical examination without abnormal findings: Secondary | ICD-10-CM | POA: Diagnosis not present

## 2024-07-10 DIAGNOSIS — R7303 Prediabetes: Secondary | ICD-10-CM | POA: Diagnosis not present

## 2024-07-10 DIAGNOSIS — E782 Mixed hyperlipidemia: Secondary | ICD-10-CM

## 2024-07-10 DIAGNOSIS — T466X5A Adverse effect of antihyperlipidemic and antiarteriosclerotic drugs, initial encounter: Secondary | ICD-10-CM

## 2024-07-10 DIAGNOSIS — M791 Myalgia, unspecified site: Secondary | ICD-10-CM | POA: Diagnosis not present

## 2024-07-10 NOTE — Assessment & Plan Note (Signed)
 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

## 2024-07-10 NOTE — Assessment & Plan Note (Signed)
 BMI 35 and complicated by hypertension. Taking zepbound  and will continue.

## 2024-07-10 NOTE — Assessment & Plan Note (Signed)
 Sugars are now improved with 25 pound weight loss and will monitor at least yearly.

## 2024-07-10 NOTE — Assessment & Plan Note (Signed)
 Recent lipid panel at goal on repatha  continue.

## 2024-07-10 NOTE — Assessment & Plan Note (Signed)
Flu shot yearly. Pneumonia complete. Shingrix complete. Tetanus up to date. Colonoscopy up to date. Mammogram up to date, pap smear aged out and dexa up to date. Counseled about sun safety and mole surveillance. Counseled about the dangers of distracted driving. Given 10 year screening recommendations.   

## 2024-07-10 NOTE — Progress Notes (Signed)
 "  Subjective:   Patient ID: Amy Williamson, female    DOB: 11/23/1950, 73 y.o.   MRN: [id number]  The patient is here for physical. Pertinent topics discussed: Discussed the use of AI scribe software for clinical note transcription with the patient, who gave verbal consent to proceed.  History of Present Illness Amy Williamson is a 73 year old female with atrial fibrillation who presents for follow-up after starting Tikosyn .  She has a history of atrial fibrillation and recently started Tikosyn , requiring hospitalization from Tuesday to Friday for monitoring. Her heart reverted to atrial fibrillation on November 13th, necessitating cardioversion, which was unsuccessful. Since starting Tikosyn , she has experienced no new issues related to atrial fibrillation. She has undergone two ablations, the most recent in May of this year.  She experiences swelling in her feet and knees, causing pain, particularly in cold weather. She was previously on Zetvan, which was discontinued for the ablation and hospitalization. During the four weeks off the medication, she gained two to three pounds but has since restarted it. She experiences constipation as a side effect, requiring medication every other day for management.  She has a history of sleep apnea and recently completed an at-home sleep study for insurance purposes, which was initially denied but later approved. She has difficulty sleeping at night and is uncertain about which sleep aids are safe to use with Tikosyn .  No chest pain, tightness, pressure, breathing troubles, headaches, or migraines. Her blood pressure was elevated after switching from diltiazem  to losartan  but has since stabilized. Her magnesium  was slightly low during hospitalization but was corrected with IV supplementation.  She has a mole near her bra line that itches frequently, possibly due to irritation.  She reports swelling and pain in her knees, particularly the right knee,  which sometimes pops. She has had a steroid injection in the past, which was ineffective, and is concerned about the effects of steroids on her atrial fibrillation. She has bursitis in her hips and has not been engaging in leg exercises or walking recently.  PMH, [facility], social history reviewed and updated  Review of Systems  Constitutional: Negative.   HENT: Negative.    Eyes: Negative.   Respiratory:  Negative for cough, chest tightness and shortness of breath.   Cardiovascular:  Negative for chest pain, palpitations and leg swelling.  Gastrointestinal:  Positive for constipation. Negative for abdominal distention, abdominal pain, diarrhea, nausea and vomiting.  Musculoskeletal:  Positive for arthralgias.  Skin: Negative.   Neurological: Negative.   Psychiatric/Behavioral: Negative.      Objective:  Physical Exam Constitutional:      Appearance: She is well-developed.  HENT:     Head: Normocephalic and atraumatic.  Cardiovascular:     Rate and Rhythm: Normal rate and regular rhythm.  Pulmonary:     Effort: Pulmonary effort is normal. No respiratory distress.     Breath sounds: Normal breath sounds. No wheezing or rales.  Abdominal:     General: Bowel sounds are normal. There is no distension.     Palpations: Abdomen is soft.     Tenderness: There is no abdominal tenderness.  Musculoskeletal:        General: Tenderness present.     Cervical back: Normal range of motion.     Comments: Bilateral knee pain  Skin:    General: Skin is warm and dry.  Neurological:     Mental Status: She is alert and oriented to person, place, and time.  Coordination: Coordination normal.     Vitals:   07/10/24 0845  BP: 130/80  Pulse: (!) 58  Temp: 97.6 F (36.4 C)  TempSrc: Oral  SpO2: 99%  Weight: 179 lb 6.4 oz (81.4 kg)  Height: 5' (1.524 m)    Assessment & Plan:   "

## 2024-07-10 NOTE — Assessment & Plan Note (Signed)
 She is working on weight loss to help. Taking zepbound  and down 25 pounds. This was noted incidentally on CT coronary 2024.

## 2024-07-10 NOTE — Assessment & Plan Note (Signed)
 Taking zepbound  to help treat. Continue.

## 2024-07-28 ENCOUNTER — Ambulatory Visit: Admitting: Student

## 2024-07-30 MED ORDER — ZEPBOUND 5 MG/0.5ML ~~LOC~~ SOAJ: 5.0000 mg | SUBCUTANEOUS | 0 refills | Status: AC

## 2024-07-30 NOTE — Addendum Note (Signed)
 Addended by: Lucciano Vitali L on: 07/30/2024 04:38 PM   Modules accepted: Orders

## 2024-07-31 ENCOUNTER — Ambulatory Visit (INDEPENDENT_AMBULATORY_CARE_PROVIDER_SITE_OTHER)

## 2024-07-31 VITALS — Ht 60.0 in | Wt 176.0 lb

## 2024-07-31 DIAGNOSIS — Z0000 Encounter for general adult medical examination without abnormal findings: Secondary | ICD-10-CM | POA: Diagnosis not present

## 2024-07-31 NOTE — Progress Notes (Signed)
 "  Chief Complaint  Patient presents with   Medicare Wellness     Subjective:   Amy Williamson is a 74 y.o. female who presents for a Medicare Annual Wellness Visit.  Visit info / Clinical Intake: Medicare Wellness Visit Type:: Subsequent Annual Wellness Visit Persons participating in visit and providing information:: patient Medicare Wellness Visit Mode:: Telephone If telephone:: video declined Since this visit was completed virtually, some vitals may be partially provided or unavailable. Missing vitals are due to the limitations of the virtual format.: Documented vitals are patient reported If Telephone or Video please confirm:: I connected with patient using audio/video enable telemedicine. I verified patient identity with two identifiers, discussed telehealth limitations, and patient agreed to proceed. Patient Location:: home Provider Location:: home Interpreter Needed?: No Pre-visit prep was completed: yes AWV questionnaire completed by patient prior to visit?: yes Date:: 07/30/24 Living arrangements:: (Patient-Rptd) lives with spouse/significant other Patient's Overall Health Status Rating: (!) (Patient-Rptd) fair Typical amount of pain: (!) (Patient-Rptd) a lot Does pain affect daily life?: (!) (Patient-Rptd) yes  Dietary Habits and Nutritional Risks How many meals a day?: (Patient-Rptd) 3 Eats fruit and vegetables daily?: (Patient-Rptd) yes Most meals are obtained by: (Patient-Rptd) preparing own meals; eating out In the last 2 weeks, have you had any of the following?: none Diabetic:: no  Functional Status Activities of Daily Living (to include ambulation/medication): (Patient-Rptd) Independent Ambulation: (Patient-Rptd) Independent Medication Administration: (Patient-Rptd) Independent Home Management (perform basic housework or laundry): (Patient-Rptd) Needs assistance (comment) Manage your own finances?: (Patient-Rptd) yes Primary transportation is: (Patient-Rptd)  driving Concerns about vision?: no *vision screening is required for WTM*  Fall Screening Falls in the past year?: (Patient-Rptd) 0 Number of falls in past year: 0 Was there an injury with Fall?: 0 Fall Risk Category Calculator: 0 Patient Fall Risk Level: Low Fall Risk  Fall Risk Patient at Risk for Falls Due to: No Fall Risks Fall risk Follow up: Falls evaluation completed; Falls prevention discussed  Home and Transportation Safety: All rugs have non-skid backing?: (!) (Patient-Rptd) no All stairs or steps have railings?: (Patient-Rptd) yes Grab bars in the bathtub or shower?: (Patient-Rptd) yes Have non-skid surface in bathtub or shower?: (Patient-Rptd) yes Good home lighting?: (Patient-Rptd) yes Regular seat belt use?: (Patient-Rptd) yes Hospital stays in the last year:: (!) (Patient-Rptd) yes How many hospital stays:: (Patient-Rptd) 2  Cognitive Assessment Difficulty concentrating, remembering, or making decisions? : (Patient-Rptd) yes Will 6CIT or Mini Cog be Completed: yes What year is it?: 0 points What month is it?: 0 points Give patient an address phrase to remember (5 components): [address], [place] About what time is it?: 0 points Count backwards from 20 to 1: 0 points Say the months of the year in reverse: 0 points Repeat the address phrase from earlier: 0 points 6 CIT Score: 0 points  Advance Directives (For Healthcare) Does Patient Have a Medical Advance Directive?: Yes Type of Advance Directive: Healthcare Power of [place]; Living will Copy of Healthcare Power of Attorney in Chart?: No - copy requested Healthcare Power of Attorney Requested and Now in Chart: Copy in chart Copy of Living Will in Chart?: No - copy requested Living Will Requested and Now in Chart: Copy in chart Would patient like information on creating a medical advance directive?: No - Patient declined  Reviewed/Updated  Reviewed/Updated: Reviewed All (Medical, Surgical, Family,  Medications, Allergies, Care Teams, Patient Goals)    Allergies (verified) Tramadol , Atenolol , Avelox [moxifloxacin hcl in nacl], Azithromycin , Doxycycline , Moxifloxacin hcl,  Myrbetriq  [mirabegron ], Naproxen, and Oxybutynin    Current Medications (verified) Outpatient Encounter Medications as of 07/31/2024  Medication Sig   acetaminophen  (TYLENOL ) 650 MG CR tablet Take 1,300 mg by mouth in the morning and at bedtime.   apixaban  (ELIQUIS ) 5 MG TABS tablet Take 1 tablet (5 mg total) by mouth 2 (two) times daily. (Patient taking differently: Take 5 mg by mouth as needed.)   bisacodyl  (DULCOLAX) 5 MG EC tablet Take 5 mg by mouth daily as needed for moderate constipation.   Cholecalciferol  (VITAMIN D3 PO) Take 1,000 Units by mouth in the morning.   Cyanocobalamin  (B-12 PO) Take 1,000 mcg by mouth in the morning.   dofetilide  (TIKOSYN ) 500 MCG capsule Take 1 capsule (500 mcg total) by mouth 2 (two) times daily.   Evolocumab  (REPATHA  SURECLICK) 140 MG/ML SOAJ Inject 140 mg into the skin every 14 (fourteen) days.   furosemide  (LASIX ) 20 MG tablet Take 1 tablet (20 mg total) by mouth daily as needed.   losartan  (COZAAR ) 25 MG tablet Take 1 tablet (25 mg total) by mouth daily.   pantoprazole  (PROTONIX ) 40 MG tablet TAKE 1 TABLET BY MOUTH EVERY DAY   STUDY - ASPIRE - aspirin  81 mg or placebo tablet (PI-Sethi) Take 81 mg by mouth daily.   tirzepatide  (ZEPBOUND ) 5 MG/0.5ML Pen Inject 5 mg into the skin once a week.   No facility-administered encounter medications on file as of 07/31/2024.    History: Past Medical History:  Diagnosis Date   Anticoagulant long-term use    eliquis  --- managed by cardiology   Arthritis    BACK AND SHOULDERS   Depressive disorder, not elsewhere classified    Essential hypertension    followed by pcp and cardiology   Frequency of urination    GERD (gastroesophageal reflux disease)    Headache    migraines when younger   History of dysphagia 2015   s/p egd w/  dilation, pt did not have stricture   History of gastritis    OSA (obstructive sleep apnea)    followed by dr t. 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 Cindie   Pure hypercholesterolemia    Past Surgical History:  Procedure Laterality Date   ATRIAL FIBRILLATION ABLATION N/A 03/30/2022   Procedure: ATRIAL FIBRILLATION ABLATION;  Surgeon: Cindie Ole DASEN, MD;  Location: [facility] INVASIVE CV LAB;  Service: Cardiovascular;  Laterality: N/A;   ATRIAL FIBRILLATION ABLATION N/A 11/28/2023   Procedure: ATRIAL FIBRILLATION ABLATION;  Surgeon: Cindie Ole DASEN, MD;  Location: [facility] INVASIVE CV LAB;  Service: Cardiovascular;  Laterality: N/A;   BREAST LUMPECTOMY WITH RADIOACTIVE SEED LOCALIZATION Left 06/09/2020   Procedure: LEFT BREAST LUMPECTOMY WITH RADIOACTIVE SEED LOCALIZATION;  Surgeon: Vernetta Berg, MD;  Location: [facility];  Service: General;  Laterality: Left;  LMA   CARDIOVERSION N/A 07/28/2020   Procedure: CARDIOVERSION;  Surgeon: Leavens Stanly LABOR,  MD;  Location: [facility];  Service: Cardiovascular;  Laterality: N/A;   CARDIOVERSION N/A 07/11/2023   Procedure: CARDIOVERSION;  Surgeon: Jeffrie Oneil BROCKS, MD;  Location: [facility] INVASIVE CV LAB;  Service: Cardiovascular;  Laterality: N/A;   CARDIOVERSION N/A 06/11/2024   Procedure: CARDIOVERSION;  Surgeon: Francyne Headland, MD;  Location:  [facility] INVASIVE CV LAB;  Service: Cardiovascular;  Laterality: N/A;   CARPAL TUNNEL RELEASE Bilateral right 2004;  left 2006   CATARACT EXTRACTION W/ INTRAOCULAR LENS  IMPLANT, BILATERAL  2016   CESAREAN SECTION  8026,8022   BILATERAL TUBAL LIGATION W/ LAST C/S   COLONOSCOPY WITH PROPOFOL   last one 08/ 2013   DILATATION & CURETTAGE/HYSTEROSCOPY WITH MYOSURE N/A 11/22/2020   Procedure: DILATATION & CURETTAGE/HYSTEROSCOPY WITH MYOSURE;  Surgeon: Cleotilde Ronal RAMAN, MD;  Location: [facility];  Service: Gynecology;  Laterality: N/A;   EAR CYST EXCISION  08/03/2011   patient states this is incorrect was a Baker's cyst popliteal area   ESOPHAGOGASTRODUODENOSCOPY (EGD) WITH ESOPHAGEAL DILATION  08/2013   KNEE ARTHROSCOPY W/ MENISCAL REPAIR Left 08-03-2011  @[facility]    and excision baker's cyst   LEFT ATRIAL APPENDAGE OCCLUSION N/A 06/28/2022   Procedure: LEFT ATRIAL APPENDAGE OCCLUSION;  Surgeon: Cindie Ole DASEN, MD;  Location: [facility] CV LAB;  Service: Cardiovascular;  Laterality: N/A;   LUMBAR FUSION  02/19/2022   posterior- lumbar 5-6   PULLEY RELEASE RIGHT TRIGGER FINGER Right 01/08/2011   TEE WITHOUT CARDIOVERSION N/A 06/28/2022   Procedure: TRANSESOPHAGEAL ECHOCARDIOGRAM (TEE);  Surgeon: Cindie Ole DASEN, MD;  Location: [facility] INVASIVE CV LAB;  Service: Cardiovascular;  Laterality: N/A;   TRIGGER FINGER RELEASE Bilateral left 05/06/14;  right  02-29-2014   Family History  Problem Relation Age of Onset   Heart failure Mother    Osteoporosis Mother    Hyperlipidemia Mother    Hypertension Mother    Heart disease Father    Clotting disorder Father    Liver cancer Brother    Liver disease Maternal Aunt        x2   Colon cancer Neg Hx    Social History   Occupational History   Occupation: retired  Tobacco Use   Smoking status: Never   Smokeless tobacco: Never   Tobacco comments:    Never smoke 10/11/21  Vaping Use   Vaping status: Never Used  Substance and Sexual  Activity   Alcohol use: No   Drug use: Never   Sexual activity: Not Currently    Birth control/protection: Surgical, Post-menopausal    Comment: BTL   Tobacco Counseling Counseling given: Not Answered Tobacco comments: Never smoke 10/11/21  SDOH Screenings   Food Insecurity: No Food Insecurity (07/31/2024)  Housing: Low Risk (07/31/2024)  Transportation Needs: No Transportation Needs (07/31/2024)  Utilities: Not At Risk (07/31/2024)  Alcohol Screen: Low Risk (06/26/2023)  Depression (PHQ2-9): Medium Risk (07/31/2024)  Financial Resource Strain: Low Risk (07/09/2024)  Physical Activity: Inactive (07/31/2024)  Social Connections: Socially Integrated (07/31/2024)  Stress: No Stress Concern Present (07/31/2024)  Tobacco Use: Low Risk (07/31/2024)  Health Literacy: Adequate Health Literacy (07/31/2024)   See flowsheets for full screening details  Depression Screen PHQ 2 & 9 Depression Scale- Over the past 2 weeks, how often have you been bothered by any of the following problems? Little interest or pleasure in doing things: 1 Feeling down, depressed, or hopeless (PHQ Adolescent also includes...irritable): 1 PHQ-2 Total Score: 2 Trouble falling or staying asleep, or sleeping too much: 3 (staying asleep) Feeling tired or  having little energy: 3 Poor appetite or overeating (PHQ Adolescent also includes...weight loss): 0 Feeling bad about yourself - or that you are a failure or have let yourself or your family down: 0 Trouble concentrating on things, such as reading the newspaper or watching television (PHQ Adolescent also includes...like school work): 1 (reading) Moving or speaking so slowly that other people could have noticed. Or the opposite - being so fidgety or restless that you have been moving around a lot more than usual: 0 Thoughts that you would be better off dead, or of hurting yourself in some way: 0 PHQ-9 Total Score: 9 If you checked off any problems, how difficult have these problems made  it for you to do your work, take care of things at home, or get along with other people?: Somewhat difficult  Depression Treatment Depression Interventions/Treatment : Patient refuses Treatment (patient declines any resource referrals)     Goals Addressed   None          Objective:    Today's Vitals   07/31/24 0855  Weight: 176 lb (79.8 kg)  Height: 5' (1.524 m)   Body mass index is 34.37 kg/m.  Hearing/Vision screen Hearing Screening - Comments:: Denies hearing difficulties   Vision Screening - Comments:: Had cataract/UTD/Dr. Kennyth Immunizations and Health Maintenance Health Maintenance  Topic Date Due   COVID-19 Vaccine (4 - 2025-26 season) 03/23/2024   Fecal DNA (Cologuard)  07/25/2025   Medicare Annual Wellness (AWV)  07/31/2025   DTaP/Tdap/Td (3 - Td or Tdap) 08/03/2025   Mammogram  11/12/2025   Pneumococcal Vaccine: 50+ Years  Completed   Influenza Vaccine  Completed   Bone Density Scan  Completed   Hepatitis C Screening  Completed   Zoster Vaccines- Shingrix  Completed   Meningococcal B Vaccine  Aged Out   Colonoscopy  Discontinued        Assessment/Plan:  This is a routine wellness examination for [place].  Patient Care Team: Rollene Almarie LABOR, MD as PCP - General (Internal Medicine) Shlomo Wilbert SAUNDERS, MD as PCP - Sleep Medicine (Cardiology) Cindie Ole DASEN, MD (Inactive) as PCP - Electrophysiology (Cardiology) Santo Stanly LABOR, MD as PCP - Cardiology (Cardiology) Obie Princella HERO, MD (Inactive) (Gastroenterology) Cleotilde Ronal RAMAN, MD (Gynecology) Winnie Ficks, MD (Gastroenterology) Camillo Golas, MD (Ophthalmology) Camella Fallow, MD (Orthopedic Surgery) Szabat, Toribio BROCKS, [facility] (Inactive) as Pharmacist (Pharmacist)  I have personally reviewed and noted the following in the patients chart:   Medical and social history Use of alcohol, tobacco or illicit drugs  Current medications and supplements including opioid  prescriptions. Functional ability and status Nutritional status Physical activity Advanced directives List of other physicians Hospitalizations, surgeries, and ER visits in previous 12 months Vitals Screenings to include cognitive, depression, and falls Referrals and appointments  No orders of the defined types were placed in this encounter.  In addition, I have reviewed and discussed with patient certain preventive protocols, quality metrics, and best practice recommendations. A written personalized care plan for preventive services as well as general preventive health recommendations were provided to patient.   Sheneka Schrom L Marqus Macphee, CMA   07/31/2024   Return in 1 year (on 07/31/2025).  After Visit Summary: (MyChart) Due to this being a telephonic visit, the after visit summary with patients personalized plan was offered to patient via MyChart   Nurse Notes: No voiced or noted concerns at this time. "

## 2024-07-31 NOTE — Telephone Encounter (Signed)
 Please do updated PA for Zepbound .  Pt weight dropped from 95.7 kg to 80.9 kg in 2025  (15.5% drop in body weight)

## 2024-07-31 NOTE — Patient Instructions (Addendum)
 Amy Williamson,  Thank you for taking the time for your Medicare Wellness Visit. I appreciate your continued commitment to your health goals. Please review the care plan we discussed, and feel free to reach out if I can assist you further.  Please note that Annual Wellness Visits do not include a physical exam. Some assessments may be limited, especially if the visit was conducted virtually. If needed, we may recommend an in-person follow-up with your provider.  Ongoing Care Seeing your primary care provider every 3 to 6 months helps us  monitor your health and provide consistent, personalized care. Next office visit on 01/08/2025.  Each day, aim for 6 glasses of water , plenty of protein in your diet and try to get up and walk/ stretch every hour for 5-10 minutes at a time.    Referrals If a referral was made during today's visit and you haven't received any updates within two weeks, please contact the referred provider directly to check on the status.  Recommended Screenings:  Health Maintenance  Topic Date Due   COVID-19 Vaccine (4 - 2025-26 season) 03/23/2024   Medicare Annual Wellness Visit  06/25/2024   Cologuard (Stool DNA test)  07/25/2025   DTaP/Tdap/Td vaccine (3 - Td or Tdap) 08/03/2025   Breast Cancer Screening  11/12/2025   Pneumococcal Vaccine for age over 91  Completed   Flu Shot  Completed   Osteoporosis screening with Bone Density Scan  Completed   Hepatitis C Screening  Completed   Zoster (Shingles) Vaccine  Completed   Meningitis B Vaccine  Aged Out   Colon Cancer Screening  Discontinued       07/30/2024    9:23 AM  Advanced Directives  Does Patient Have a Medical Advance Directive? Yes  Type of Estate Agent of [place];Living will  Copy of Healthcare Power of Attorney in Chart? No - copy requested    Vision: Annual vision screenings are recommended for early detection of glaucoma, cataracts, and diabetic retinopathy. These exams can also reveal  signs of chronic conditions such as diabetes and high blood pressure.  Dental: Annual dental screenings help detect early signs of oral cancer, gum disease, and other conditions linked to overall health, including heart disease and diabetes.  Please see the attached documents for additional preventive care recommendations.

## 2024-08-03 ENCOUNTER — Telehealth: Payer: Self-pay | Admitting: Pharmacy Technician

## 2024-08-03 ENCOUNTER — Other Ambulatory Visit (HOSPITAL_COMMUNITY): Payer: Self-pay

## 2024-08-03 NOTE — Telephone Encounter (Signed)
 Pharmacy Patient Advocate Encounter   Received notification from Physician's Office that prior authorization for zepbound  is required/requested.   Insurance verification completed.   The patient is insured through [facility] [employer]/RX [employer].   Per test claim: PA required; PA submitted to above mentioned insurance via Latent Key/confirmation #/EOC [id number] Status is pending

## 2024-08-04 ENCOUNTER — Other Ambulatory Visit (HOSPITAL_COMMUNITY): Payer: Self-pay

## 2024-08-04 LAB — LIPID PANEL
Chol/HDL Ratio: 2.6 ratio (ref 0.0–4.4)
Cholesterol, Total: 140 mg/dL (ref 100–199)
HDL: 54 mg/dL
LDL Chol Calc (NIH): 67 mg/dL (ref 0–99)
Triglycerides: 102 mg/dL (ref 0–149)
VLDL Cholesterol Cal: 19 mg/dL (ref 5–40)

## 2024-08-04 NOTE — Telephone Encounter (Signed)
 Pharmacy Patient Advocate Encounter  Received notification from [employer] that Prior Authorization for zepbound  has been APPROVED from 08/03/24 to 07/22/25. Ran test claim, Copay is $527.15  one month. This test claim was processed through [facility]- copay amounts may vary at other pharmacies due to pharmacy/plan contracts, or as the patient moves through the different stages of their insurance plan.   PA #/Case ID/Reference #: [id number]

## 2024-08-05 ENCOUNTER — Ambulatory Visit (HOSPITAL_COMMUNITY)
Admission: RE | Admit: 2024-08-05 | Discharge: 2024-08-05 | Disposition: A | Discharge status: Home / Self Care | Source: Ambulatory Visit | Attending: Physician Assistant | Admitting: Physician Assistant

## 2024-08-05 VITALS — BP 160/78 | HR 68 | Ht 60.0 in | Wt 177.4 lb

## 2024-08-05 DIAGNOSIS — I10 Essential (primary) hypertension: Secondary | ICD-10-CM

## 2024-08-05 DIAGNOSIS — Z79899 Other long term (current) drug therapy: Secondary | ICD-10-CM | POA: Diagnosis not present

## 2024-08-05 DIAGNOSIS — I4819 Other persistent atrial fibrillation: Secondary | ICD-10-CM | POA: Diagnosis not present

## 2024-08-05 DIAGNOSIS — Z5181 Encounter for therapeutic drug level monitoring: Secondary | ICD-10-CM

## 2024-08-05 DIAGNOSIS — I4891 Unspecified atrial fibrillation: Secondary | ICD-10-CM

## 2024-08-05 DIAGNOSIS — D6869 Other thrombophilia: Secondary | ICD-10-CM

## 2024-08-05 MED ORDER — LOSARTAN POTASSIUM 50 MG PO TABS: 50.0000 mg | ORAL_TABLET | Freq: Every day | ORAL | 1 refills | Status: DC

## 2024-08-05 NOTE — Telephone Encounter (Signed)
 Spoke with patient.  She is aware prior auth was re-approved.  Will increase to 5 mg dose.  Rx sent to pharmacy last week, she can call them to re-process.

## 2024-08-05 NOTE — Progress Notes (Signed)
 "   Primary Care Physician: Rollene Almarie LABOR, MD Primary Cardiologist: Stanly LABOR Leavens, MD Electrophysiologist: OLE ONEIDA HOLTS, MD (Inactive)  Referring Physician: Josefa Beauvais, NP   Amy Williamson is a 74 y.o. female with a history of HTN, aortic atherosclerosis, OSA, HLD, atrial fibrillation who presents for follow up in the [facility].  The patient was initially diagnosed with atrial fibrillation 06/2020 and underwent DCCV on 07/28/20. She presented to the ED 09/26/21 with symptoms palpitations and underwent DCCV but unfortunately returned to the ED on 10/06/21 and had another DCCV. She has symptoms of fatigue while in afib and for several days afterwards. She did have a steroid injection for bursitis a week before her first ED visit. She was started on flecainide  but had intolerable side effects. She did have a breakthrough episode of afib 12/12/21 and took a PRN dose of flecainide  which converted her to SR.    S/p afib ablation 03/30/22 with Dr Holts. S/p Watchman implant 06/28/22. She presented to the emergency department July 04, 2023 with rapidly conducted atrial flutter. Eliquis  was restarted and she was set up for outpatient cardioversion. She was started on amiodarone  and underwent DCCV on 07/11/23. She then underwent repeat ablation, afib and flutter, with Dr Holts on 11/28/23. Amiodarone  was discontinue prior to ablation.  During previous visit she presented in symptomatic atrial fibrillation and underwent DCCV with conversion to sinus rhythm on 06/11/2024. During visit she also expressed interest in pursuing dofetilide  for rhythm control. She is s/p dofetilide  loading 06/23/24.   Patient returns for follow up for atrial fibrillation and dofetilide  monitoring. She remains in SR today and feels well. She denies any interim symptoms of afib. No bleeding issues on anticoagulation. She has noted elevated BP readings at home.   Today, she  denies  symptoms of palpitations, chest pain, shortness of breath, orthopnea, PND, lower extremity edema, dizziness, presyncope, syncope, bleeding, or neurologic sequela. The patient is tolerating medications without difficulties and is otherwise without complaint today.    Atrial Fibrillation Risk Factors:   she does have symptoms or diagnosis of sleep apnea. she does not have a history of rheumatic fever. she does not have a history of alcohol use. The patient does not have a history of early familial atrial fibrillation or other arrhythmias.     Atrial Fibrillation Management history:   Previous antiarrhythmic drugs: flecainide , amiodarone  (did not tolerate well), dofetilide   Previous cardioversions: 07/28/20, 09/26/21, 10/06/21, 07/11/23, 06/11/24 Previous ablations: 03/30/22, 11/28/23 Anticoagulation history: Eliquis   ROS- All systems are reviewed and negative except as per the HPI above.  Past Medical History:  Diagnosis Date   Anticoagulant long-term use    eliquis  --- managed by cardiology   Arthritis    BACK AND SHOULDERS   Depressive disorder, not elsewhere classified    Essential hypertension    followed by pcp and cardiology   Frequency of urination    GERD (gastroesophageal reflux disease)    Headache    migraines when younger   History of dysphagia 2015   s/p egd w/ dilation, pt did not have stricture   History of gastritis    OSA (obstructive sleep apnea)    followed by dr t. 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 Cindie   Pure hypercholesterolemia     Current Outpatient Medications  Medication Sig Dispense Refill   acetaminophen  (TYLENOL ) 650 MG CR tablet Take 1,300 mg by mouth in the morning and at bedtime.     aspirin  EC 81 MG tablet Take 81 mg by mouth daily. Swallow whole.     Cholecalciferol  (VITAMIN D3 PO) Take 1,000 Units by mouth in the morning.     Cyanocobalamin  (B-12 PO) Take 1,000 mcg by mouth in the morning.     dofetilide  (TIKOSYN ) 500 MCG capsule Take 1 capsule (500 mcg total) by mouth 2 (two) times daily. 180 capsule 1   Evolocumab  (REPATHA  SURECLICK) 140 MG/ML SOAJ Inject 140 mg into the skin every 14 (fourteen) days. 6 mL 3   furosemide  (LASIX ) 20 MG tablet Take 1 tablet (20 mg total) by mouth daily as needed. (Patient taking differently: Take 20 mg by mouth as needed.) 30 tablet 11   Magnesium  Hydroxide (MILK OF MAGNESIA PO) Taking 1 capful as needed     pantoprazole  (PROTONIX ) 40 MG tablet TAKE 1 TABLET BY MOUTH EVERY DAY 90 tablet 4   STUDY - ASPIRE - aspirin  81 mg or placebo tablet (PI-Sethi) Take 81 mg by mouth daily.     tirzepatide  (ZEPBOUND ) 5 MG/0.5ML Pen Inject 5 mg into the skin once a week. 2 mL 0   losartan  (COZAAR ) 50 MG tablet Take 1 tablet (50 mg total) by mouth daily. 30 tablet 1   No current facility-administered medications for this encounter.    Physical Exam: BP (!) 160/78   Pulse 68   Ht 5' (1.524 m)   Wt 80.5 kg   LMP 07/23/2004   BMI 34.65 kg/m   GEN: Well nourished, well developed in no acute distress CARDIAC: Regular rate and rhythm, no murmurs, rubs, gallops RESPIRATORY:  Clear to auscultation without rales, wheezing or rhonchi  ABDOMEN: Soft, non-tender, non-distended EXTREMITIES:  No edema; No deformity    Wt Readings from Last 3 Encounters:  08/05/24 80.5 kg  07/31/24 79.8 kg  07/10/24 81.4 kg      EKG Interpretation Date/Time:  Wednesday August 05 2024 14:08:31 EST Ventricular Rate:  68 PR Interval:  196 QRS Duration:  80 QT Interval:  416 QTC Calculation: 442 R Axis:   5  Text Interpretation: Normal sinus rhythm Anterior infarct (cited on or before 05-Aug-2024) Abnormal ECG When compared with ECG of 03-Jul-2024 14:02, No significant change was found Confirmed by Hussien Greenblatt ([id number]) on 08/05/2024 2:20:34 PM    Echo 01/16/24 demonstrated: 1. Left ventricular ejection fraction, by estimation, is 60 to 65%. The  left ventricle has normal function. The left ventricle has no regional  wall motion abnormalities. Left ventricular diastolic parameters are  consistent with Grade II diastolic dysfunction (pseudonormalization).   2. Right ventricular systolic function is normal. The right ventricular  size is normal. There is normal pulmonary artery systolic pressure. The  estimated right ventricular systolic pressure is 32.6 mmHg.   3. The mitral valve is normal in structure. Mild mitral valve  regurgitation. No evidence of mitral stenosis.   4.  The aortic valve is normal in structure. Aortic valve regurgitation is  not visualized. No aortic stenosis is present.   5. The inferior vena cava is normal in size with greater than 50%  respiratory variability, suggesting right atrial pressure of 3 mmHg.    CHA2DS2-VASc Score = 4  The patient's score is based upon: CHF History: 0 HTN History: 1 Diabetes History: 0 Stroke History: 0 Vascular Disease History: 1 Age Score: 1 Gender Score: 1       ASSESSMENT AND PLAN: Persistent Atrial Fibrillation (ICD10:  I48.19) The patient's CHA2DS2-VASc score is 4, indicating a 4.8% annual risk of stroke.   S/p afib ablation 03/30/22, afib and flutter ablation 11/28/23 S/p dofetilide  loading 06/2024 Patient appears to be maintaining SR Continue dofetilide  500 mcg BID  Secondary Hypercoagulable State (ICD10:  D68.69) The patient is at  significant risk for stroke/thromboembolism based upon her CHA2DS2-VASc Score of 4.  S/p Watchman implant 06/28/22.   High Risk Medication Monitoring (ICD 10: [id number]) Patient requires ongoing monitoring for anti-arrhythmic medication which has the potential to cause life threatening arrhythmias. QT interval on ECG acceptable for dofetilide  monitoring. Check bmet/mag today.     Obesity Body mass index is 34.65 kg/m.  Encouraged lifestyle modification On Zepbound    OSA  Encouraged nightly use of oral device.   HTN Elevated today and at home. Will increase losartan  to 50 mg daily Continue to monitor.    Follow up in the AF clinic in 3 months.    [facility] [address] [place], [place] [address] [phone] "

## 2024-08-06 ENCOUNTER — Other Ambulatory Visit (HOSPITAL_COMMUNITY): Payer: Self-pay

## 2024-08-06 ENCOUNTER — Ambulatory Visit (HOSPITAL_COMMUNITY): Payer: Self-pay | Admitting: Physician Assistant

## 2024-08-06 ENCOUNTER — Ambulatory Visit: Payer: Self-pay | Admitting: Pharmacist Clinician (PhC)/ Clinical Pharmacy Specialist

## 2024-08-06 ENCOUNTER — Telehealth: Payer: Self-pay | Admitting: Pharmacy Technician

## 2024-08-06 LAB — BASIC METABOLIC PANEL WITH GFR
BUN/Creatinine Ratio: 22 (ref 12–28)
BUN: 16 mg/dL (ref 8–27)
CO2: 25 mmol/L (ref 20–29)
Calcium: 9.8 mg/dL (ref 8.7–10.3)
Chloride: 102 mmol/L (ref 96–106)
Creatinine, Ser: 0.72 mg/dL (ref 0.57–1.00)
Glucose: 87 mg/dL (ref 70–99)
Potassium: 4.2 mmol/L (ref 3.5–5.2)
Sodium: 141 mmol/L (ref 134–144)
eGFR: 88 mL/min/1.73

## 2024-08-06 LAB — MAGNESIUM: Magnesium: 2.1 mg/dL (ref 1.6–2.3)

## 2024-08-06 NOTE — Telephone Encounter (Signed)
 Hi, I got the patient a grant for her repatha . Can someone please send her repatha  prescription to her [facility] pharmacy? Thank you!   Patient Advocate Encounter   The patient was approved for a [employer] grant that will help cover the cost of repatha  Total amount awarded, 2500.  Effective: 07/07/24 - 07/06/25   APW:[id number] ERW:[id number] Hmnle:[id number] PI:[id number] [employer] ID: [id number]   Pharmacy provided with approval and processing information in [facility]. Patient informed via mychart

## 2024-08-13 ENCOUNTER — Encounter: Payer: Self-pay | Admitting: *Deleted

## 2024-08-13 NOTE — Progress Notes (Signed)
 Amy Williamson                                          MRN: [id number]   08/13/2024   The [facility] Quality Team Specialist reviewed this patient medical record for the purposes of chart review for care gap closure. The following were reviewed: abstraction for care gap closure-controlling blood pressure.    [facility] Quality Team

## 2024-08-27 ENCOUNTER — Other Ambulatory Visit (HOSPITAL_COMMUNITY): Payer: Self-pay | Admitting: Physician Assistant

## 2024-11-05 ENCOUNTER — Ambulatory Visit (HOSPITAL_COMMUNITY): Admitting: Physician Assistant

## 2025-01-08 ENCOUNTER — Ambulatory Visit: Admitting: Internal Medicine
